# Patient Record
Sex: Male | Born: 1935 | Race: White | Hispanic: No | Smoking: Former smoker
Health system: Southern US, Community
[De-identification: ages and names within clinical notes are randomized; demographics above are authoritative.]

## PROBLEM LIST (undated history)

## (undated) ENCOUNTER — Emergency Department (HOSPITAL_COMMUNITY): Admission: EM | Payer: MEDICARE | Source: Home / Self Care

## (undated) DIAGNOSIS — Z438 Encounter for attention to other artificial openings: Secondary | ICD-10-CM

## (undated) DIAGNOSIS — N189 Chronic kidney disease, unspecified: Secondary | ICD-10-CM

## (undated) DIAGNOSIS — M109 Gout, unspecified: Secondary | ICD-10-CM

## (undated) DIAGNOSIS — I82409 Acute embolism and thrombosis of unspecified deep veins of unspecified lower extremity: Secondary | ICD-10-CM

## (undated) DIAGNOSIS — Z960 Presence of urogenital implants: Secondary | ICD-10-CM

## (undated) DIAGNOSIS — R32 Unspecified urinary incontinence: Secondary | ICD-10-CM

## (undated) DIAGNOSIS — D649 Anemia, unspecified: Secondary | ICD-10-CM

## (undated) DIAGNOSIS — Z972 Presence of dental prosthetic device (complete) (partial): Secondary | ICD-10-CM

## (undated) DIAGNOSIS — I10 Essential (primary) hypertension: Secondary | ICD-10-CM

## (undated) DIAGNOSIS — F32A Depression, unspecified: Secondary | ICD-10-CM

## (undated) DIAGNOSIS — F419 Anxiety disorder, unspecified: Secondary | ICD-10-CM

## (undated) DIAGNOSIS — C801 Malignant (primary) neoplasm, unspecified: Secondary | ICD-10-CM

## (undated) DIAGNOSIS — I5032 Chronic diastolic (congestive) heart failure: Secondary | ICD-10-CM

## (undated) DIAGNOSIS — K219 Gastro-esophageal reflux disease without esophagitis: Secondary | ICD-10-CM

## (undated) DIAGNOSIS — E871 Hypo-osmolality and hyponatremia: Secondary | ICD-10-CM

## (undated) DIAGNOSIS — Z87442 Personal history of urinary calculi: Secondary | ICD-10-CM

## (undated) DIAGNOSIS — N289 Disorder of kidney and ureter, unspecified: Secondary | ICD-10-CM

## (undated) DIAGNOSIS — Z973 Presence of spectacles and contact lenses: Secondary | ICD-10-CM

## (undated) DIAGNOSIS — I6529 Occlusion and stenosis of unspecified carotid artery: Secondary | ICD-10-CM

## (undated) DIAGNOSIS — IMO0001 Reserved for inherently not codable concepts without codable children: Secondary | ICD-10-CM

## (undated) DIAGNOSIS — E785 Hyperlipidemia, unspecified: Secondary | ICD-10-CM

## (undated) HISTORY — DX: Unspecified urinary incontinence: R32

## (undated) HISTORY — DX: Hypo-osmolality and hyponatremia: E87.1

## (undated) HISTORY — DX: Essential (primary) hypertension: I10

## (undated) HISTORY — PX: NEPHROSTOMY: SHX1014

## (undated) HISTORY — PX: CORONARY STENT PLACEMENT: SHX1402

## (undated) HISTORY — DX: Reserved for inherently not codable concepts without codable children: IMO0001

## (undated) HISTORY — PX: OTHER SURGICAL HISTORY: SHX169

## (undated) HISTORY — DX: Gastro-esophageal reflux disease without esophagitis: K21.9

## (undated) HISTORY — DX: Hyperlipidemia, unspecified: E78.5

## (undated) HISTORY — PX: CARDIAC CATHETERIZATION: SHX172

## (undated) HISTORY — DX: Occlusion and stenosis of unspecified carotid artery: I65.29

## (undated) HISTORY — DX: Chronic diastolic (congestive) heart failure: I50.32

## (undated) HISTORY — PX: URINARY SPHINCTER IMPLANT: SHX2624

## (undated) HISTORY — PX: HERNIA REPAIR: SHX51

## (undated) HISTORY — PX: MULTIPLE TOOTH EXTRACTIONS: SHX2053

## (undated) HISTORY — DX: Chronic kidney disease, unspecified: N18.9

---

## 2001-01-01 ENCOUNTER — Encounter: Admission: RE | Admit: 2001-01-01 | Discharge: 2001-04-01 | Payer: Self-pay | Admitting: Radiation Oncology

## 2002-06-23 ENCOUNTER — Ambulatory Visit (HOSPITAL_COMMUNITY): Admission: RE | Admit: 2002-06-23 | Discharge: 2002-06-23 | Payer: Self-pay | Admitting: General Surgery

## 2002-11-23 ENCOUNTER — Ambulatory Visit (HOSPITAL_COMMUNITY): Admission: RE | Admit: 2002-11-23 | Discharge: 2002-11-23 | Payer: Self-pay | Admitting: Pulmonary Disease

## 2002-12-08 ENCOUNTER — Ambulatory Visit (HOSPITAL_COMMUNITY): Admission: RE | Admit: 2002-12-08 | Discharge: 2002-12-08 | Payer: Self-pay | Admitting: Cardiology

## 2002-12-08 ENCOUNTER — Encounter: Payer: Self-pay | Admitting: Cardiology

## 2002-12-16 ENCOUNTER — Ambulatory Visit (HOSPITAL_COMMUNITY): Admission: RE | Admit: 2002-12-16 | Discharge: 2002-12-17 | Payer: Self-pay | Admitting: *Deleted

## 2002-12-27 ENCOUNTER — Encounter: Payer: Self-pay | Admitting: Cardiology

## 2002-12-27 ENCOUNTER — Ambulatory Visit (HOSPITAL_COMMUNITY): Admission: RE | Admit: 2002-12-27 | Discharge: 2002-12-27 | Payer: Self-pay | Admitting: Cardiology

## 2003-02-03 ENCOUNTER — Encounter (HOSPITAL_COMMUNITY): Admission: RE | Admit: 2003-02-03 | Discharge: 2003-03-05 | Payer: Self-pay | Admitting: Cardiology

## 2003-08-29 ENCOUNTER — Ambulatory Visit (HOSPITAL_COMMUNITY): Admission: RE | Admit: 2003-08-29 | Discharge: 2003-08-29 | Payer: Self-pay | Admitting: Pulmonary Disease

## 2004-05-07 ENCOUNTER — Ambulatory Visit (HOSPITAL_COMMUNITY): Admission: RE | Admit: 2004-05-07 | Discharge: 2004-05-07 | Payer: Self-pay | Admitting: Internal Medicine

## 2005-04-22 ENCOUNTER — Ambulatory Visit: Payer: Self-pay | Admitting: Cardiology

## 2005-11-04 ENCOUNTER — Emergency Department (HOSPITAL_COMMUNITY): Admission: EM | Admit: 2005-11-04 | Discharge: 2005-11-05 | Payer: Self-pay | Admitting: Emergency Medicine

## 2006-06-12 ENCOUNTER — Ambulatory Visit: Payer: Self-pay | Admitting: Cardiology

## 2006-06-12 ENCOUNTER — Ambulatory Visit: Payer: Self-pay

## 2006-06-19 ENCOUNTER — Ambulatory Visit: Payer: Self-pay

## 2006-06-19 ENCOUNTER — Ambulatory Visit: Payer: Self-pay | Admitting: Cardiology

## 2006-06-26 ENCOUNTER — Ambulatory Visit: Payer: Self-pay | Admitting: Cardiology

## 2006-08-06 ENCOUNTER — Ambulatory Visit (HOSPITAL_COMMUNITY): Admission: RE | Admit: 2006-08-06 | Discharge: 2006-08-06 | Payer: Self-pay | Admitting: Pulmonary Disease

## 2007-11-01 ENCOUNTER — Emergency Department (HOSPITAL_COMMUNITY): Admission: EM | Admit: 2007-11-01 | Discharge: 2007-11-01 | Payer: Self-pay | Admitting: Emergency Medicine

## 2007-12-08 ENCOUNTER — Ambulatory Visit: Payer: Self-pay | Admitting: Cardiology

## 2007-12-08 LAB — CONVERTED CEMR LAB
ALT: 19 units/L (ref 0–53)
AST: 26 units/L (ref 0–37)
Albumin: 3.8 g/dL (ref 3.5–5.2)
Alkaline Phosphatase: 63 units/L (ref 39–117)
BUN: 20 mg/dL (ref 6–23)
Bilirubin, Direct: 0.1 mg/dL (ref 0.0–0.3)
CO2: 28 meq/L (ref 19–32)
Calcium: 9.6 mg/dL (ref 8.4–10.5)
Chloride: 104 meq/L (ref 96–112)
Cholesterol: 167 mg/dL (ref 0–200)
Creatinine, Ser: 1.7 mg/dL — ABNORMAL HIGH (ref 0.4–1.5)
GFR calc Af Amer: 51 mL/min
GFR calc non Af Amer: 42 mL/min
Glucose, Bld: 120 mg/dL — ABNORMAL HIGH (ref 70–99)
HDL: 36 mg/dL — ABNORMAL LOW (ref >39.0)
LDL Cholesterol: 98 mg/dL (ref 0–99)
Potassium: 4.1 meq/L (ref 3.5–5.1)
Sodium: 138 meq/L (ref 135–145)
Total Bilirubin: 0.9 mg/dL (ref 0.3–1.2)
Total CHOL/HDL Ratio: 4.6
Total Protein: 7.7 g/dL (ref 6.0–8.3)
Triglycerides: 166 mg/dL — ABNORMAL HIGH (ref 0–149)
VLDL: 33 mg/dL (ref 0–40)

## 2007-12-11 ENCOUNTER — Observation Stay (HOSPITAL_COMMUNITY): Admission: EM | Admit: 2007-12-11 | Discharge: 2007-12-12 | Payer: Self-pay | Admitting: Emergency Medicine

## 2007-12-11 ENCOUNTER — Ambulatory Visit: Payer: Self-pay | Admitting: Cardiology

## 2007-12-15 ENCOUNTER — Ambulatory Visit: Payer: Self-pay

## 2007-12-18 ENCOUNTER — Ambulatory Visit: Payer: Self-pay | Admitting: Cardiology

## 2008-06-10 ENCOUNTER — Ambulatory Visit: Payer: Self-pay

## 2008-06-15 ENCOUNTER — Ambulatory Visit: Payer: Self-pay | Admitting: Cardiology

## 2008-07-26 ENCOUNTER — Ambulatory Visit: Payer: Self-pay | Admitting: Cardiology

## 2008-07-26 LAB — CONVERTED CEMR LAB
ALT: 19 units/L (ref 0–53)
AST: 23 units/L (ref 0–37)
Albumin: 3.4 g/dL — ABNORMAL LOW (ref 3.5–5.2)
Alkaline Phosphatase: 63 units/L (ref 39–117)
BUN: 26 mg/dL — ABNORMAL HIGH (ref 6–23)
Bilirubin, Direct: 0.1 mg/dL (ref 0.0–0.3)
CO2: 26 meq/L (ref 19–32)
Calcium: 9 mg/dL (ref 8.4–10.5)
Chloride: 115 meq/L — ABNORMAL HIGH (ref 96–112)
Cholesterol: 146 mg/dL (ref 0–200)
Creatinine, Ser: 1.7 mg/dL — ABNORMAL HIGH (ref 0.4–1.5)
GFR calc Af Amer: 51 mL/min
GFR calc non Af Amer: 42 mL/min
Glucose, Bld: 101 mg/dL — ABNORMAL HIGH (ref 70–99)
HDL: 30.3 mg/dL — ABNORMAL LOW (ref >39.0)
LDL Cholesterol: 82 mg/dL (ref 0–99)
Potassium: 4.3 meq/L (ref 3.5–5.1)
Sodium: 143 meq/L (ref 135–145)
Total Bilirubin: 0.5 mg/dL (ref 0.3–1.2)
Total CHOL/HDL Ratio: 4.8
Total Protein: 6.8 g/dL (ref 6.0–8.3)
Triglycerides: 171 mg/dL — ABNORMAL HIGH (ref 0–149)
VLDL: 34 mg/dL (ref 0–40)

## 2008-12-08 ENCOUNTER — Ambulatory Visit: Payer: Self-pay | Admitting: Cardiology

## 2009-01-05 ENCOUNTER — Ambulatory Visit (HOSPITAL_COMMUNITY): Admission: RE | Admit: 2009-01-05 | Discharge: 2009-01-05 | Payer: Self-pay | Admitting: Pulmonary Disease

## 2009-02-03 ENCOUNTER — Ambulatory Visit (HOSPITAL_COMMUNITY): Admission: RE | Admit: 2009-02-03 | Discharge: 2009-02-03 | Payer: Self-pay | Admitting: Pulmonary Disease

## 2009-02-08 ENCOUNTER — Encounter (HOSPITAL_COMMUNITY): Admission: RE | Admit: 2009-02-08 | Discharge: 2009-03-10 | Payer: Self-pay | Admitting: Pulmonary Disease

## 2009-03-10 ENCOUNTER — Ambulatory Visit (HOSPITAL_COMMUNITY): Admission: RE | Admit: 2009-03-10 | Discharge: 2009-03-10 | Payer: Self-pay | Admitting: Pulmonary Disease

## 2009-12-28 ENCOUNTER — Ambulatory Visit (HOSPITAL_COMMUNITY): Admission: RE | Admit: 2009-12-28 | Discharge: 2009-12-28 | Payer: Self-pay | Admitting: Urology

## 2010-01-08 ENCOUNTER — Ambulatory Visit (HOSPITAL_COMMUNITY): Admission: RE | Admit: 2010-01-08 | Discharge: 2010-01-08 | Payer: Self-pay | Admitting: Pulmonary Disease

## 2010-03-01 ENCOUNTER — Ambulatory Visit (HOSPITAL_COMMUNITY): Admission: RE | Admit: 2010-03-01 | Discharge: 2010-03-01 | Payer: Self-pay | Admitting: Urology

## 2010-03-14 ENCOUNTER — Emergency Department (HOSPITAL_COMMUNITY)
Admission: EM | Admit: 2010-03-14 | Discharge: 2010-03-14 | Payer: Self-pay | Source: Home / Self Care | Admitting: Emergency Medicine

## 2010-04-16 ENCOUNTER — Ambulatory Visit (HOSPITAL_COMMUNITY): Admission: RE | Admit: 2010-04-16 | Discharge: 2010-04-16 | Payer: Self-pay | Admitting: Urology

## 2010-04-20 ENCOUNTER — Encounter: Payer: Self-pay | Admitting: Orthopedic Surgery

## 2010-04-20 ENCOUNTER — Emergency Department (HOSPITAL_COMMUNITY)
Admission: EM | Admit: 2010-04-20 | Discharge: 2010-04-20 | Payer: Self-pay | Source: Home / Self Care | Admitting: Emergency Medicine

## 2010-04-25 ENCOUNTER — Ambulatory Visit: Payer: Self-pay | Admitting: Orthopedic Surgery

## 2010-04-25 DIAGNOSIS — S42033A Displaced fracture of lateral end of unspecified clavicle, initial encounter for closed fracture: Secondary | ICD-10-CM | POA: Insufficient documentation

## 2010-05-16 ENCOUNTER — Ambulatory Visit: Payer: Self-pay | Admitting: Orthopedic Surgery

## 2010-06-08 ENCOUNTER — Ambulatory Visit (HOSPITAL_COMMUNITY): Admission: RE | Admit: 2010-06-08 | Discharge: 2010-06-08 | Payer: Self-pay | Admitting: Orthopedic Surgery

## 2010-06-08 ENCOUNTER — Encounter: Payer: Self-pay | Admitting: Orthopedic Surgery

## 2010-06-11 ENCOUNTER — Ambulatory Visit: Payer: Self-pay | Admitting: Orthopedic Surgery

## 2010-06-14 ENCOUNTER — Encounter: Payer: Self-pay | Admitting: Cardiology

## 2010-06-14 DIAGNOSIS — I6529 Occlusion and stenosis of unspecified carotid artery: Secondary | ICD-10-CM | POA: Insufficient documentation

## 2010-06-14 DIAGNOSIS — I2510 Atherosclerotic heart disease of native coronary artery without angina pectoris: Secondary | ICD-10-CM | POA: Insufficient documentation

## 2010-06-15 ENCOUNTER — Encounter: Payer: Self-pay | Admitting: Cardiology

## 2010-06-15 ENCOUNTER — Ambulatory Visit: Payer: Self-pay

## 2010-08-27 DIAGNOSIS — D649 Anemia, unspecified: Secondary | ICD-10-CM | POA: Insufficient documentation

## 2010-08-27 DIAGNOSIS — R32 Unspecified urinary incontinence: Secondary | ICD-10-CM | POA: Insufficient documentation

## 2010-08-27 DIAGNOSIS — R809 Proteinuria, unspecified: Secondary | ICD-10-CM | POA: Insufficient documentation

## 2010-08-27 DIAGNOSIS — Z8546 Personal history of malignant neoplasm of prostate: Secondary | ICD-10-CM | POA: Insufficient documentation

## 2010-12-23 ENCOUNTER — Encounter: Payer: Self-pay | Admitting: Urology
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
## 2011-01-01 NOTE — Miscellaneous (Signed)
Summary: Orders Update  Clinical Lists Changes  Problems: Added new problem of CAROTID ARTERY DISEASE (ICD-433.10) Orders: Added new Test order of Carotid Duplex (Carotid Duplex) - Signed 
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
## 2011-01-01 NOTE — Letter (Signed)
Summary: History form  History form   Imported By: Ruffin Pyo 04/25/2010 16:33:05  _____________________________________________________________________  External Attachment:    Type:   Image     Comment:   External Document

## 2011-01-01 NOTE — Assessment & Plan Note (Signed)
Summary: 3 WK RE-CK/XRAY COLLARBONE/RR MEDICARE,MUT OM/CAF   Visit Type:  Follow-up Referring Provider:  [facility] er Primary Provider:  Dr. Luan Pulling  CC:  shoulder clavicle fracture.  History of Present Illness: fracture care followup  LEFT distal clavicle fracture classification type II  He reports he is doing well his date of injury was May 20 he is here for followup x-ray  X-ray shows that his fracture is continuing nondisplaced fracture line still visible  Clinical exam shows no tenderness over the fracture site and near normal range of motion and strength  Patient is considered clinically healed and to resume normal activity  The x-ray was done at [facility]    Allergies: 1)  ! Nubain 2)  ! Codeine   Impression & Recommendations:  Problem # 1:  CLOSED FRACTURE OF ACROMIAL END OF CLAVICLE (ICD-810.03) Assessment Improved  Other Orders: Post-Op Check [id number])  Patient Instructions: 1)  ACTIVITY AS TOLERATED  2)  Please schedule a follow-up appointment as needed.

## 2011-01-08 ENCOUNTER — Telehealth: Payer: Self-pay | Admitting: Cardiology

## 2011-01-17 NOTE — Progress Notes (Signed)
Summary: pt needs to stop aspirin for 5 days   Phone Note From Other Clinic   Caller: Patient Caller: [phone] [facility] hospital Summary of Call: pt needs to stop aspirin min of 5 days- Initial call taken by: Lorenda Hatchet,  January 08, 2011 1:43 PM  Follow-up for Phone Call        I spoke with Juliann Pulse in pre-op at [facility] is to have artificial bladder with sphincter surgery on 01/28/11.  Surgeon would like to know if pt can come off baby aspirin prior to surgery.  It is not a problem if he cannot.  Follow-up by: Joelyn Oms RN,  January 08, 2011 2:49 PM     Appended Document: pt needs to stop aspirin for 5 days yes. i agree.  Appended Document: pt needs to stop aspirin for 5 days Juliann Pulse is aware of recommendations Horton Chin RN

## 2011-02-20 LAB — URINE CULTURE: Colony Count: >=100000

## 2011-02-20 LAB — URINALYSIS, ROUTINE W REFLEX MICROSCOPIC
Bilirubin Urine: NEGATIVE
Glucose, UA: NEGATIVE mg/dL
Ketones, ur: NEGATIVE mg/dL
Nitrite: NEGATIVE
Specific Gravity, Urine: 1.015 (ref 1.005–1.030)
Urobilinogen, UA: 0.2 mg/dL (ref 0.0–1.0)
pH: 6.5 (ref 5.0–8.0)

## 2011-02-20 LAB — URINE MICROSCOPIC-ADD ON

## 2011-02-24 LAB — BASIC METABOLIC PANEL
BUN: 28 mg/dL — ABNORMAL HIGH (ref 6–23)
CO2: 27 mEq/L (ref 19–32)
Calcium: 9.2 mg/dL (ref 8.4–10.5)
Chloride: 106 mEq/L (ref 96–112)
Creatinine, Ser: 1.88 mg/dL — ABNORMAL HIGH (ref 0.4–1.5)
GFR calc Af Amer: 43 mL/min — ABNORMAL LOW (ref >60)
GFR calc non Af Amer: 35 mL/min — ABNORMAL LOW (ref >60)
Glucose, Bld: 94 mg/dL (ref 70–99)
Potassium: 4.5 mEq/L (ref 3.5–5.1)
Sodium: 139 mEq/L (ref 135–145)

## 2011-02-24 LAB — HEMOGLOBIN AND HEMATOCRIT, BLOOD
HCT: 32.7 % — ABNORMAL LOW (ref 39.0–52.0)
Hemoglobin: 11.8 g/dL — ABNORMAL LOW (ref 13.0–17.0)

## 2011-04-16 NOTE — Assessment & Plan Note (Signed)
[facility]                            CARDIOLOGY OFFICE NOTE   NAME:[name], [name] STOTLER                      MRN:          [id number]  DATE:12/08/2007                            DOB:          01-Nov-1934    [name] [name] returns today for further management of the following issues.  1. Coronary artery disease.  He is having no angina or ischemic      symptoms.  He is still active playing golf.  He is due a stress      Myoview this summer.  Last stress Myoview was June 19, 2006 EF of      58% with no ischemia.  2. Hyperlipidemia.  He has not been for blood work in some time and      would like me to check blood work.  3. Nonobstructive carotid disease.  He is due carotid Dopplers this      summer as well.  4. Hypertension.   He has been having a lot of palpitations.  This been particularly over  the holiday season.  He said this holiday was harder than the last one,  having lost his wife 2 years ago this April.   He does get out and play golf.  He says he is having no problems when he  is playing golf.   CURRENT MEDS:  1. Lipitor 10 mg a day.  2. Aspirin 81 mg a day.  3. Multivitamin daily.  4. Fish oil b.i.d.  5. flaxseed oil b.i.d.  6. St. John's wort b.i.d.  7. Citrucel.  8. Prevacid 30 mg a day.  9. Alfalfa 650 mg b.i.d.  10.Atenolol chlorthalidone 50/25 daily.  11.Benazepril 40 mg a day.  12.He carries sublingual nitroglycerin.   EXAM:  Blood pressure 122/62, pulse 60 and regular is EKG is normal.  Weight is 194.  He seems somewhat depressed. Respirations 18.  Alert, oriented, otherwise.  HEENT:  Unchanged.  Carotids were equal bilaterally with soft systolic sounds.  Thyroid is  not enlarged.  Trachea is midline.  LUNGS:  Clear.  HEART:  Reveals a regular rate and rhythm.  PMI is nondisplaced.  ABDOMEN:  Soft, good bowel sounds.  No obvious organomegaly.  EXTREMITIES:  No sinus clubbing or edema.  Pulses are present.   ASSESSMENT/PLAN:   [name] [name] is stable from our standpoint.  The  palpitations are probably of no major consequence.  He is already on  beta blockade.  Will check his potassium.   He is due lipids and will arrange for these today.   He will return in June for stress Myoview as well as carotid Dopplers.  I will see him shortly thereafter.     Thomas C. Verl Blalock, MD, [facility]  Electronically Signed    TCW/MedQ  DD: 12/08/2007  DT: 12/08/2007  Job #: [id number]   cc:   Percell Miller L. Luan Pulling, M.D.

## 2011-04-16 NOTE — Consult Note (Signed)
[name] [name], [name] [name]               ACCOUNT NO.:  [id number]   MEDICAL RECORD NO.:  [id number]          PATIENT TYPE:  INP   LOCATION:  A204                          FACILITY:  [facility]   PHYSICIAN:  Cristopher Estimable. Lattie Haw, MD, FACCDATE OF BIRTH:  07/15/1934   DATE OF CONSULTATION:  12/11/2007  DATE OF DISCHARGE:                                 CONSULTATION   REFERRING PHYSICIAN:  Dr. Luan Pulling.   PRIMARY CARDIOLOGIST:  Dr. Verl Blalock.   HISTORY OF PRESENT ILLNESS:  A 77 year old gentleman with coronary  disease and previous percutaneous intervention presents with chest  discomfort.  [name] [name] cardiac history dates to 2004 when he required  a drug-eluting stent to the mid left anterior descending coronary  artery.  He had residual lesions of 30-60% in a diagonal, a marginal and  in the right coronary.  His most recent stress nuclear study was  negative in July 2007 pain. He has occasional chest discomfort.  He  carries nitroglycerin, but does not take it.  He has decent exercise  tolerance.  He presents with 8 hours of persistent left chest aching  discomfort of mild to moderate severity.  There are no associated  symptoms.  There is no radiation.  There is no chest wall tenderness.  There is no relationship to body position, time of day or activity.  He  took nitroglycerin without benefit.  His symptoms have subsided since he  came to the hospital.   Cardiovascular risk factors include hypertension and hyperlipidemia.  He  has a remote history of cigarette smoking with a 30 pack-year  consumption.   PAST MEDICAL HISTORY:  Otherwise notable for prior prostatectomy for  carcinoma, prior hernia repair, GERD, DJD and cataracts with surgery  just 1 day ago.   SOCIAL HISTORY:  Retired from work with the railroad; widower who  resides in [place].  No excessive use of alcohol.   No real allergies.  Prior adverse reaction to Nubain and codeine.   CURRENT MEDICATIONS:  1. Atorvastatin 10 mg  daily.  2. Aspirin 81 mg daily.  3. Multivitamin.  4. Fish oil.  5. Prevacid 30 mg daily.  6. Atenolol/chlorthalidone 50/12.5 mg daily.  7. Benazepril 40 mg daily.   FAMILY HISTORY:  Father and mother both suffered fatal CVAs.   REVIEW OF SYSTEMS:  Notable for recent headache, recent pneumococcal  vaccine and influenza vaccine, intermittent heartburn, a regular diet,  upper and lower dentures, need for corrective lenses.  All other systems  reviewed and are negative.   PHYSICAL EXAMINATION:  GENERAL:  A pleasant, well-appearing gentleman in  no acute distress.  VITAL SIGNS:  The temperature is 98.0, heart rate 65 and regular,  respirations 18, blood pressure 135/66, O2 saturation 97% on 2 liters.  Weight 87.1 kg.  HEENT:  EOMs full; normal oral mucosa.  NECK:  No jugular venous distention; normal carotid upstrokes without  bruits.  ENDOCRINE:  No thyromegaly.  HEMATOPOIETIC:  No adenopathy.  SKIN:  No significant lesions.  PSYCHIATRIC:  Alert and oriented; normal affect.  LUNGS:  Clear.  CARDIAC:  Normal first and second  heart sounds; fourth heart sound and  minimal systolic ejection murmur present; normal PMI.  ABDOMEN:  Normal bowel sounds; no masses; no organomegaly.  EXTREMITIES:  No edema; distal pulses intact.  NEUROMUSCULAR:  Symmetric strength and tone; normal cranial nerves.   EKG:  Normal sinus rhythm; within normal limits.  Other laboratory notable for a normal CBC, class 3-4 chronic kidney  disease with a creatinine of 1.9, potassium of 3.5, negative point of  care markers and a decent lipid profile with total cholesterol of 166,  triglycerides of 167, HDL 36 and LDL of 98.   IMPRESSION:  [name] [name] has somewhat atypical chest discomfort with a  benign exam, negative cardiac markers and a negative EKG.  If symptoms  do not recur, and his EKG and cardiac tests remain negative, he can be  discharged on his usual therapy with plans for a outpatient stress  nuclear  study and follow-up with Dr. Verl Blalock.      Cristopher Estimable. Lattie Haw, MD, [facility]  Electronically Signed     RMR/MEDQ  D:  12/11/2007  T:  12/12/2007  Job:  [id number]

## 2011-04-16 NOTE — Discharge Summary (Signed)
NAME:  [name] [name], [name] [name]               ACCOUNT NO.:  [id number]   MEDICAL RECORD NO.:  [id number]          PATIENT TYPE:  OBV   LOCATION:  A204                          FACILITY:  [facility]   PHYSICIAN:  Edward L. Luan Pulling, M.D.DATE OF BIRTH:  01-25-1934   DATE OF ADMISSION:  12/11/2007  DATE OF DISCHARGE:  01/10/2009LH                               DISCHARGE SUMMARY   FINAL DISCHARGE DIAGNOSES:  1. Atypical chest pain.  2. Coronary artery occlusive disease.  3. Nonobstructive carotid disease.  4. Hyperlipidemia.  5. Hypertension.  6. Recent cataract surgery.  7. Prostate cancer.  8. Mild chronic renal failure.   HISTORY:  [name] [name] is a 77 year old who has a known history of cardiac  disease.  He had cataract surgery on the day prior to admission, went to  eat at a seafood restaurant and developed chest discomfort midway  through his meal.  He had just seen Dr. Verl Blalock, his cardiologist, on the  6th of this month and was really asymptomatic then.  He was set up for a  stress Myoview in June and repeat carotid Dopplers.  He continued to  have chest discomfort which lasted about 8 hours.  He received  nitroglycerin which did not help.  He received Dilaudid which did help  his chest discomfort and then made him very nauseated and dizzy.   PHYSICAL EXAM:  GENERAL:  Showed a well-developed, well-nourished male  who is in no acute distress.  He pointed to his left chest lateral to  the nipple where his pain was and he said that he could cover it with  his finger.  CHEST:  His chest was clear.  HEART:  His heart was regular without gallop or murmurs.  ABDOMEN:  His abdomen was soft.   LAB WORK:  BUN 28, creatinine 1.92.   Cardiac markers were negative in the ER and full cardiac markers were  negative in the hospital.  He had consultation with Dr. Jacqulyn Ducking  of [facility] team who felt that if he ruled out by enzymatic  criteria he could be discharged for an early Myoview stress  test with  Dr. Verl Blalock and that will be arranged.  Otherwise he is going to continue  on his previous medications which include Lipitor 10 mg daily, aspirin  81 mg daily, multiple vitamin one daily, fish oil b.i.d., flaxseed oil  b.i.d., St. John's Wort b.i.d., Citrucel as needed.  Prevacid, he had  been on 30 mg a day and I am going to have him take it twice a day for  the next 2 weeks.  Alfalfa 650 mg b.i.d.,  atenolol/chlorthalidone 50/25 daily, benazepril 240 mg daily and  sublingual nitroglycerin on a p.r.n. basis.  He has been taking  ibuprofen for headaches and I have told him that that may be part of the  problem.  He may also require an ultrasound of his gallbladder if he has  recurrent symptoms.      Edward L. Luan Pulling, M.D.  Electronically Signed     ELH/MEDQ  D:  12/12/2007  T:  12/12/2007  Job:  [id number]   cc:   Thomas C. Longfellow, [place], [address] [place]  [place] [address]

## 2011-04-16 NOTE — Group Therapy Note (Signed)
NAME:  PETAR, REDDER               ACCOUNT NO.:  [id number]   MEDICAL RECORD NO.:  [id number]          PATIENT TYPE:  INP   LOCATION:  A204                          FACILITY:  [facility]   PHYSICIAN:  Edward L. Luan Pulling, M.D.DATE OF BIRTH:  01-Dec-1934   DATE OF PROCEDURE:  DATE OF DISCHARGE:                                 PROGRESS NOTE   Mr. Randlett is doing okay.  He has had no more chest pain.  He feels  alright with no complaints.   PHYSICAL EXAMINATION:  VITAL SIGNS:  Temperature:  98.  Pulse:  60.  Respirations:  18.  Blood pressure:  140/63.  LUNGS:  Chest is clear.  HEART:  Regular.  ABDOMEN:  Soft.   Dr.  Izell [place] input is appreciated and Mr. Garrette is now ruled out for  myocardial infarction by enzymatic criteria.  Per Dr. Izell  note, I  want to plan to discharge him home to try to get him an early follow up  with Dr. Verl Blalock, his regular cardiologist, for a stress test, etc.      Edward L. Luan Pulling, M.D.  Electronically Signed     ELH/MEDQ  D:  12/12/2007  T:  12/12/2007  Job:  [id number]

## 2011-04-16 NOTE — Assessment & Plan Note (Signed)
[facility]                            CARDIOLOGY OFFICE NOTE   NAME:Fugett, DEMANTE BREINING                      MRN:          [id number]  DATE:12/18/2007                            DOB:          1934/08/14    Mr. Goosman comes today after being hospitalized [facility] with  atypical chest pain.  He had an ache under his left breast and this  happened after eating a fairly greasy meal and [place] he admits.   He Ruled out for myocardial infarction.   We set him up for an adenosine rest stress Myoview which shows a trivial  area of apical inferior lateral decreased activity did not show any  significant change with rest or stress.  His EF could not be calculated.   He had no further discomfort.  He has noted fair amount of reflux and  Dr. Luan Pulling just his increase his Prevacid to 30 mg b.i.d.  He is bad to  skip meals, particularly before he goes walking.  The other day he felt  a little lightheaded.   His meds are Lipitor 10 mg a day, multivitamin daily, fish oil b.i.d.,  flaxseed oil b.i.d., St. John's wort b.i.d., Prevacid 30 mg a day,  alfalfa 600mg  p.o. b.i.d., atenolol chlorthalidone 50/25 daily,  benazepril 40 mg a day, aspirin, 81 mg a day.   Blood pressure 138/55, his pulse 58 and regular.  Weight is 192 up 7.  He seems a little bit depressed as he had a few weeks ago in the office.  HEENT:  Unchanged.  Carotids are full.  No bruits.  Thyroid is not  enlarged.  Trachea is midline.  LUNGS:  Clear.  HEART:  Reveals a regular rate and rhythm.  ABDOMEN:  Soft, good bowel sounds.  No midline tenderness or epigastric  tenderness.  There is no hepatomegaly or Murphy sign.  EXTREMITIES:  No cyanosis clubbing, edema.  Pulses intact.  NEURO:  Exam is intact.   Reviewed the stress test.  We also reviewed his blood work which shows  some dietary indiscretion compared to last year.  His total cholesterol  was 167, triglycerides increased 93 to  166, HDL 39 to 36, LDL 86 to 98.  This on 10 Lipitor.   We had a nice talk today.  I have recommended not skip meals and  certainly the before he exercises.  He will stay with a b.i.d.,  Prevacid.  In addition he will try to improve his eating habits.  He is  widowed and probably eats out a lot.   Will plan on seeing him back again in 6 months for closer follow-up.     Thomas C. Verl Blalock, MD, [facility]  Electronically Signed    TCW/MedQ  DD: 12/18/2007  DT: 12/18/2007  Job #: [id number]   cc:   Percell Miller L. Luan Pulling, M.D.

## 2011-04-16 NOTE — Assessment & Plan Note (Signed)
[facility] OFFICE NOTE   NAME:[name] [name], [name] GILLY                      MRN:          [id number]  DATE:12/08/2008                            DOB:          12-18-1933    Ms. [name] [name] comes in today for followup.  He seems depressed and very  lonely without his wife.  She died several years ago.  He used to play  his golf, but it has been extremely cold and he has not gotten out much.  He does not have any close relatives, so the holidays were a little bit  difficult.   He had an episode of chest pain after having cataract surgery in January  2009.  He ruled out for myocardial infarction.  He had a stress Myoview,  which basically showed normal perfusion.   PROBLEM LIST:  1. Coronary artery disease.  He is currently having no angina or      ischemic symptoms.  2. Nonobstructive carotid disease.  Last carotid Dopplers June 10, 2008, showed nonobstructive internal carotid artery stenosis and      antegrade flow in both vertebrals.  He is asymptomatic.  3. Hypertension.  4. Hyperlipidemia.  I increased his Lipitor to 20 mg per day and his      total cholesterol was last checked July 26, 2008, total of 146,      triglycerides of 171, HDL of 30.3, LDL of 82.  5. Chronic renal insufficiency with creatinine of 1.7.   CURRENT MEDICATIONS:  1. Multivitamin daily.  2. Fish oil b.i.d.  3. Flaxseed oil b.i.d.  4. St. John Wort b.i.d.  5. Alfalfa 650 mg p.o. b.i.d.  6. Atenolol.  7. Chlorthalidone 50/25 q.a.m.  8. Benazepril 40 mg per day.  9. Aspirin 81 mg a day.  10.Lipitor 20 mg daily.  11.He carries sublingual nitroglycerin.   PHYSICAL EXAMINATION:  VITAL SIGNS:  His blood pressure is 140/64, his  pulse is 66 and regular, his weight is 201.  HEENT:  Other than a beard, he is normal.  NECK:  Carotid upstrokes were equal bilaterally without obvious bruits.  Thyroid is not enlarged.  Trachea is midline.  LUNGS:  Clear to auscultation and percussion.  HEART:  Nondisplaced PMI.  Normal S1 and S2.  No gallop.  ABDOMEN:  Slightly protuberant, good bowel sounds.  EXTREMITIES:  No cyanosis, clubbing, or edema.  Pulses are present.  NEURO:  Intact.  His affect is depressed.  He is alert and oriented x3  otherwise.  SKIN:  Few ecchymoses.   ASSESSMENT AND PLAN:  [name] [name] [name] is stable from my standpoint.  I am  pleased with his lipid response to higher dose of Lipitor.  I have  encouraged him to get out as much as possible.  Hopefully, the cold  whether resolves soon and he can get to the playing golf.  I will plan  on seeing him back in 6 months.     Thomas C. Verl Blalock, MD, [facility]  Electronically Signed    TCW/MedQ  DD: 12/08/2008  DT: 12/09/2008  Job #: [id number]

## 2011-04-16 NOTE — Assessment & Plan Note (Signed)
[facility]                            CARDIOLOGY OFFICE NOTE   NAME:[name] [name], [name] [name]                      MRN:          [id number]  DATE:06/15/2008                            DOB:          1934-03-02    Mr. Prak comes in today for further management of his coronary artery  disease.  He is currently having no angina or ischemic symptoms.  He is  playing a lot of golf which is his outlet.  He is widowed and still  lives alone and cooks for himself.  His last stress Myoview was  12/15/2007 which was stable.  His EF was not calculated, but he had an  overall low-risk scan with no ischemia.  He also has nonobstructive  carotid disease, last carotid Doppler 06/19/2006.   He has hyperlipidemia and has not had recent blood work.  His lipids  were not quite at goal in January 2009.  He had an LDL of 98 and HDL 36.  He is on 10 of Lipitor.  He denies any orthopnea, PND, syncope,  presyncope, tachy palpitations, or angina.   CURRENT MEDICATIONS:  1. Lipitor 10 mg a day.  2. Multivitamin daily.  3. Fish oil b.i.d.  4. Flaxseed oil b.i.d.  5. St. John's wort b.i.d.  6. Alfalfa.  7. Atenolol chlorthalidone 50/25 daily.  8. Benazepril 40 mg p.o. daily.  9. Aspirin 81 mg a day.  10.Prevacid 30 mg a day.   PHYSICAL EXAMINATION:  VITAL SIGNS:  Blood pressure is 136/70, his pulse  is 65 and regular, and his weight is 192.  GENERAL:  He is extremely tanned.  HEENT:  Unremarkable.  NECK:  Carotid upstrokes were equal bilaterally without bruits.  No JVD.  Thyroid is not enlarged.  Trachea is midline.  LUNGS:  Clear.  HEART:  Regular rate and rhythm.  No gallop.  PMI is poorly appreciated.  ABDOMEN:  Soft.  Good bowel sounds.  No obvious organomegaly.  EXTREMITIES:  No cyanosis, clubbing, or edema.  Pulses are intact.  NEURO:  Intact.   Preliminary carotid Dopplers today showed no change with nonobstructive  plaque and antegrade flow in both vertebrals.   Suggestion was made to  follow up in 2 years.   ASSESSMENT AND PLAN:  Mr. Holthaus is doing well.  Since his lipids were  not at goal in January, I have increased his to Lipitor 20 grams daily.  He will need to follow lipids and a comprehensive metabolic panel in  about 6-8 weeks.  I will see him back in 6 months.     Thomas C. Verl Blalock, MD, [facility]  Electronically Signed    TCW/MedQ  DD: 06/15/2008  DT: 06/16/2008  Job #: [id number]   cc:   Percell Miller L. Luan Pulling, M.D.

## 2011-04-16 NOTE — H&P (Signed)
NAME:  [name] [name], [name] [name]               ACCOUNT NO.:  [id number]   MEDICAL RECORD NO.:  [id number]          PATIENT TYPE:  INP   LOCATION:  ED99                          FACILITY:  [facility]   PHYSICIAN:  Edward L. Luan Pulling, M.D.DATE OF BIRTH:  1934/05/23   DATE OF ADMISSION:  12/11/2007  DATE OF DISCHARGE:  LH                              HISTORY & PHYSICAL   REASON FOR ADMISSION:  Chest pain.   HISTORY:  Mr. Beerman is a 77 year old who has a known history of cardiac  disease having had a previous stent placement and who had cataract  surgery yesterday and then went to eat at [facility]  and developed some chest discomfort midway through his meal.  He had  just seen Dr. Verl Blalock, his cardiologist, on the 6th and was having no  angina and no ischemic symptoms. A stress Myoview in 2007 showed no  ischemia and he was going to be set up for a stress Myoview in June and  carotid Dopplers.  He continued to have chest discomfort through the  evening and came to the emergency room at about midnight. He did receive  nitroglycerin which did not seem to help his chest discomfort and then  he received Dilaudid which helped his chest discomfort that made him  very nauseated and dizzy.  Since then he has been in the emergency room  awaiting a bed.   PAST MEDICAL HISTORY:  Positive for coronary artery occlusive disease.  He has nonobstructive carotid disease.  He has hyperlipidemia, he has  hypertension, he has had the recent cataract, he has a history of  prostate cancer.   SOCIAL HISTORY:  He is a widower.  He does not smoke.  He does not drink  alcohol and does not use any illicit drugs.   MEDICATIONS AT HOME:  1. Lipitor or 10 mg daily.  2. Aspirin 81 mg daily.  3. Multiple vitamin 1 daily.  4. Fish oil b.i.d.  5. Flaxseed oil b.i.d.  6. St. John's Wort b.i.d.  7. Citrucel as needed.  8. Prevacid 30 mg daily.  9. Alfalfa 650 mg b.i.d.  10.Atenolol chlorthalidone 50/25 daily.  11.Benazepril 40 mg daily.  12.Sublingual nitroglycerin.   FAMILY HISTORY:  He has a positive family history of coronary disease in  several family members.   REVIEW OF SYSTEMS:  Except as mentioned is essentially negative.  He had  been to the emergency room with some bladder spasm about 2 months ago.   PHYSICAL EXAM:  He is awake and alert. He says he feels okay.  He points  to the left side of his chest lateral to the nipple where he has the  pain.  Blood pressure is in the 120s, pulse 60s, respirations about 12.  His pupils are reactive to light and accommodation.  Nose and throat are  clear.  NECK:  Supple without masses.  CHEST:  Clear without wheezes, rales or rhonchi.  HEART:  Regular.  I do not hear a gallop.  He does not have a murmur.  ABDOMEN:  Soft.  Bowel sounds present.  EXTREMITIES:  Showed no edema.   LABORATORY DATA:  Thus far cardiac markers point of care x2 are  negative.  BMET  shows BUN 28, creatinine 1.92.  CBC, white count 6700,  hemoglobin 12.8, platelet 229. Hepatic function normal.   ASSESSMENT:  He has chest discomfort.  He does have a history of  coronary disease so he is clearly going to need this to have this to  have MI ruled out.  I am going to get a cardiology consultation, serial  EKGs, cardiac enzymes and follow.      Edward L. Luan Pulling, M.D.  Electronically Signed     ELH/MEDQ  D:  12/11/2007  T:  12/11/2007  Job:  [id number]

## 2011-04-19 NOTE — Procedures (Signed)
   NAME:  [name] [name], [name] [name]                         ACCOUNT NO.:  [id number]   MEDICAL RECORD NO.:  [id number]                   PATIENT TYPE:  OUT   LOCATION:  DFTL                                 FACILITY:  [facility]   PHYSICIAN:  Edward L. Luan Pulling, M.D.             DATE OF BIRTH:  14-Feb-1934   DATE OF PROCEDURE:  11/23/2002  DATE OF DISCHARGE:                                    STRESS TEST   INDICATIONS FOR PROCEDURE:  This patient has been having an odd sensation in  which his arms feel week and this is generally associated with exertion.  He  also has hypertension and hyperlipidemia so he has multiple risk factors.  There are no contraindications to graded exercise testing.   DESCRIPTION OF PROCEDURE:  This patient exercised for 6 minutes and 18  seconds reaching and sustaining 7.0 METS.  His maximum recorded heart rate  was 130 which is 85% of his age-predicted maximal heart rate.  He had normal  blood pressure response to exercise.  He had leg fatigue, but no chest or  arm discomfort of any kind.  His electrocardiogram showed marked ST-T wave  changes inferiorly and laterally, suggestive of inducible ischemia.   IMPRESSION:  1. Fair exercise tolerance.  2. Normal blood pressure response to exercise.  3. Leg fatigue but no chest discomfort with exercise.  4. Marked ST-T wave changes suggestive of inducible ischemia with exercise.                                               Edward L. Luan Pulling, M.D.    ELH/MEDQ  D:  11/23/2002  T:  11/23/2002  Job:  [id number]

## 2011-04-19 NOTE — Op Note (Signed)
[facility]  Patient:    [name] [name], [name] [name] Visit Number: [id number] MRN: [id number]          Service Type: DSU Location: DAY Attending Physician:  Jamesetta So Dictated by:   Aviva Signs, M.D. Proc. Date: 06/23/02 Admit Date:  06/23/2002 Discharge Date: 06/23/2002   CC:         [name], M.D.   Operative Report  PREOPERATIVE DIAGNOSIS:  Right inguinal hernia.  POSTOPERATIVE DIAGNOSIS:  Right inguinal hernia, indirect.  PROCEDURE:  Right inguinal herniorrhaphy.  SURGEON:  Aviva Signs, M.D.  ANESTHESIA:  Spinal.  INDICATIONS:  The patient is a 77 year old white male who presents with a right inguinal hernia, which is symptomatic in nature.  The risks and benefits of the procedure including bleeding, infection, recurrence of the hernia, and nerve injury were fully explained to the patient and gained informed consent.  PROCEDURE NOTE:  The patient was placed in the supine position after spinal anesthesia was administered. The right groin region was prepped and draped using the usual sterile technique with Betadine.  An oblique incision was made in the right groin region down to the external oblique aponeurosis.  The aponeurosis was incised to the external ring.  A Penrose drain was placed around the spermatic cord.  The ilioinguinal nerve was identified and retracted inferiorly from the operative field.  A large lipoma of the cord was excised.  An indirect hernia sac was found and was inverted.  A large size Marlex mesh plug was placed along the floor of the internal ring and secured to the transversalis fascia using 2-0 Novofil interrupted suture. An onlay Marlex mesh patch was then placed along the floor of the inguinal canal and secured superiorly to the conjoined tendon and inferiorly to the shelving edge of the Pouparts ligament using a 2-0 Novofil interrupted suture.  The internal ring was recreated using a 2-0  Novofil interrupted suture.  The external oblique aponeurosis was reapproximated using 2-0 Vicryl running suture.  The subcutaneous layer was reapproximated using a 3-0 Vicryl interrupted suture.  The skin was closed using a 4-0 Vicryl subcuticular suture.  0.5% Sensorcaine was instilled into the surrounding wound, and the wound was covered with collodion.  All tape and needle counts were correct at the end of the procedure.  The patient was transferred to PACU in stable condition.  COMPLICATIONS: None.  SPECIMENS:  None.  BLOOD LOSS:  Minimal. Dictated by:   Aviva Signs, M.D. Attending Physician:  Jamesetta So DD:  06/23/02 TD:  06/27/02 Job: [id number]

## 2011-04-19 NOTE — Assessment & Plan Note (Signed)
[facility]                              CARDIOLOGY OFFICE NOTE   NAME:[name] [name], [name] [name]                      MRN:          [id number]  DATE:06/12/2006                            DOB:          [birth year]    HISTORY OF PRESENT ILLNESS:  Mr.  [name] [name] comes in today for follow up of the  following issues:   1.  Coronary artery disease.  He has had no ischemic symptoms.  He is due a      stress Myoview.  2.  Hyperlipidemia.  He is due lipids.  3.  Right carotid bruit.  He has nonobstructive plaque of the right internal      carotid artery and minimal plaque of the left internal carotid artery.      He has antegrade flow in both vertebrals.  He is due a carotid Doppler.  4.  Hypertension.  His blood pressure is elevated today.  Checked on couple      of occasions.  He takes his blood pressure medicines at night.   Unfortunately, he lost his wife to some unknown, undiagnosed illness in  April.  She did a lot of foreign travel.  He has a son who lives in the  [place] area and one in [place].  He has a good support group through his  church and friends, however.  He is down and out today though .   MEDICATIONS:  1.  Lipitor 10 mg daily.  2.  Aspirin 325 mg daily.  3.  Multivitamin.  4.  Fish oil.  5.  Flax seed.  6.  St. John's Wort.  7.  Prevacid 30 mg daily.  8.  Alfalfa.  9.  Benazepril 20 mg daily.  10. Atenolol HCTZ 50/25 daily.   PHYSICAL EXAMINATION:  GENERAL APPEARANCE:  He is depressed.  Very pleasant  as always.  NEUROLOGICAL:  Intact.  VITAL SIGNS:  Blood pressure 154 and 160, checked on two occasions.  His  diastolic is 123456.  Heart rate is 57.  EKG shows sinus brady, otherwise,  normal.  Weight is down 18 pounds to 185.  HEENT:  Unremarkable.  Carotids are full with soft systolic sounds  bilaterally.  Thyroid is not enlarged.  Trachea is midline.  LUNGS:  Clear.  HEART:  Regular rate and rhythm.  ABDOMEN:  Soft with good bowel  sounds.  No midline bruits.  No hepatomegaly.  EXTREMITIES:  No edema.  Pulses are intact.   STUDIES:  Abdominal ultrasound was ordered by accident.  It is normal, but  we will reverse charges on this.  He was due a carotid instead.   ASSESSMENT/PLAN:  [name] [name] is doing well.  His blood pressure is elevated,  and this could be due to his recent loss of his wife and other stressors.   PLAN:  1.  Adenosine Myoview.  2.  Carotid Doppler's.  3.  Increased Benazepril to 40 mg q.h.s.  4.  The day of his stress test, we will check a comprehensive metabolic      panel and lipid panel.  5.  Decrease aspirin to 81 mg daily because of bruising.  6.  Assuming these are stable, we will see him back in a year.                               [name] C. Verl Blalock, MD, [facility]    TCW/MedQ  DD:  06/12/2006  DT:  06/12/2006  Job #:  [id number]   cc:   [name] [name] L. Luan Pulling, MD

## 2011-04-19 NOTE — Discharge Summary (Signed)
NAME:  [name] [name], [name] [name]                         ACCOUNT NO.:  [id number]   MEDICAL RECORD NO.:  [id number]                   [name] TYPE:  OIB   LOCATION:  6529                                 FACILITY:  [facility]   PHYSICIAN:  Christy Sartorius, M.D. [facility]               DATE OF BIRTH:  Aug 14, 1934   DATE OF ADMISSION:  12/16/2002  DATE OF DISCHARGE:                           DISCHARGE SUMMARY - REFERRING   DISCHARGE DIAGNOSES:  1. Coronary artery disease status post Cypher stent placement to [name] LAD     12/16/02.  2. Hypertension, treated.  3. Hyperlipidemia, treated.  4. Prostate cancer status post prostatectomy and radiation therapy.  5. Status post hernia repair.   HOSPITAL COURSE:  [name] [name] is a 77 year old male [name] who has recently  complained of bilateral arm numbness and weakness. He underwent an exercise  stress test under [name] advisement of Dr. [name], and this revealed  inducible ischemia with exercise. He was then seen in consultation for this  problem on 12/03/02 at [name]. At that  point, he was placed on Toprol and given a prescription for sublingual  nitroglycerin. At his request, he followed back up with Dr. Mar Daring prior  to any procedure. He saw Dr. Verl Blalock 12/08/02 who recommended cardiac  catheterization. [name] [name] was then admitted on 12/16/02 for cardiac  catheterization. Catheterization was performed by Dr. Christy Sartorius and  revealed:  Left main normal, LAD 70% mid with calcification, 40% proximal D1  lesion, left circumflex noted to have two obtuse marginals with 30% mild  stenosis. [name] right coronary artery had a 60% mid stenosis with a 30% distal  lesion. Dr. Lyndel Safe then performed a PTCA/Cypher stent placement to [name] 70%  LAD lesion reducing this to 0% stenosis post procedure. LV gram revealed no  MR, good wall motion with an EF greater than 55%. His recommendations were  to place [name] [name] on Plavix for six months, and if  any symptoms persist,  we would then entertain performing a percutaneous intervention upon [name] RCA  lesion. He was maintained in [name] hospital overnight, and by 12/17/02, he was  ready for discharge to home. Vital signs at that time revealed blood  pressure 130/60, pulse 60, respirations 20, O2 saturations 95% on room air,  temperature 97.1. His left groin catheterization site was clean, and with  [name] exception of some slight ecchymosis distal to [name] puncture site, there  was no bruit, and he did have palpable lower extremity pulses.   DISCHARGE MEDICATIONS:  1. Enteric-coated aspirin 325 mg a day.  2. Plavix 75 mg a day for six months.  3. Lipitor 10 mg a day.  4. Corzide one p.o. daily.  5. Sublingual nitroglycerin p.r.n. chest pain.  6. Multivitamin daily.  7. Alfalfa 2,000 mg a day.  8. St. John's Wort daily.  9. Echinacea 350 mg p.r.n.   ALLERGIES:  Nubain.  DISCHARGE INSTRUCTIONS:  He may utilize Tylenol one to two tablets every six  hours as needed for pain. No strenuous activity for two days and gradually  will return to normal activity including driving. Low-fat diet. Clean [name]  catheterization site with soap and water. Call for questions or concerns. He  has followup appointment at Dr. Winnifred Friar office on 12/29/02 at 2 p.m.     Joesphine Bare, P.A. [facility]                      Christy Sartorius, M.D. [facility]    LB/MEDQ  D:  12/17/2002  T:  12/17/2002  Job:  [id number]   cc:   Thomas C. Wall, M.D. [facility]  [address] [address]  Fax: Donegal Luan Pulling, M.D.  [place]  [place] [address]  Fax: [phone]

## 2011-04-19 NOTE — Op Note (Signed)
NAME:  [name], [name]                         ACCOUNT NO.:  [id number]   MEDICAL RECORD NO.:  [id number]                   PATIENT TYPE:  AMB   LOCATION:  DAY                                  FACILITY:  [facility]   PHYSICIAN:  Hildred Laser, M.D.                 DATE OF BIRTH:  1934-11-25   DATE OF PROCEDURE:  DATE OF DISCHARGE:  05/07/2004                                 OPERATIVE REPORT   PROCEDURE:  Total colonoscopy.   ENDOSCOPIST:  Hildred Laser, M.D.   INDICATIONS:  [name] [name] is a 77 year old Caucasian male with recent changes in  his bowel habits.  For the last 2 months he ha has been having 3-4  semiformed somewhat loose stools.  Prior to that he was having 1 stool a  day.  He has also noticed some cramps across his upper abdomen, but has not  had any frank bleeding.  Occasionally he has noted blood on the tissue.  He  has not taken any antibiotics recently.  He is undergoing colonoscopy both  for diagnostic and screening purposes.  The procedure and risks were  reviewed with the patient and informed consent was obtained.   PREOPERATIVE MEDICATIONS:  Demerol 50 mg IV and Versed 3 mg IV.   FINDINGS:  Procedure performed in endoscopy suite.  The patient's vital  signs and O2 saturation were monitored during the procedure and remained  stable.  The patient was placed in the left lateral recumbent position and  rectal examination performed.  No abnormality noted on external or digital  exam.   Olympus videoscope was placed in the rectum and advanced under vision into  the sigmoid colon and beyond.  Preparation was felt to be excellent.  Scope  was passed to the cecum which was identified by appendiceal orifice and  ileocecal valve.  Pictures taken for the record.  A short segment of TL was  also examined and was normal.  As the scope was withdrawn the colonic mucosa  was carefully examined.  There was a single small diverticulum at the  ascending colon.  The mucosa of the rest of  the colon was normal.  Rectal  mucosa similarly was normal.   The scope was retroflexed to examine the anorectal junction and moderate-  sized hemorrhoids were noted below the dentate line.  The endoscope was  straightened and withdrawn.  The patient tolerated the procedure well.   FINAL DIAGNOSES:  1. A single small diverticulum at the ascending colon.  2. Small external hemorrhoids.  3. No evidence of endoscopic colitis.  4. Suspect that we may be dealing with IBS.   RECOMMENDATIONS:  1. High fiber diet.  2. Citrucel 1 tablespoonful daily.  3. Levbid 1/2 tablet with breakfast daily.  4. Patient will call with __________ in 6-8 weeks.  If he is not feeling     better would consider further evaluation.  ___________________________________________                                            Hildred Laser, M.D.   NR/MEDQ  D:  05/07/2004  T:  05/07/2004  Job:  [id number]   cc:   Percell Miller L. Luan Pulling, M.D.  [place]  [place] [address]  Fax: [phone]

## 2011-04-19 NOTE — Cardiovascular Report (Signed)
NAME:  [name] [name], [name] [name]                         ACCOUNT NO.:  [id number]   MEDICAL RECORD NO.:  [id number]                   PATIENT TYPE:  OIB   LOCATION:  6529                                 FACILITY:  [facility]   PHYSICIAN:  Christy Sartorius, M.D. [facility]               DATE OF BIRTH:  1934/02/06   DATE OF PROCEDURE:  12/16/2002  DATE OF DISCHARGE:                              CARDIAC CATHETERIZATION   PROCEDURE PERFORMED:  1. Left heart catheterization.  2. Left ventriculogram.  3. Selective coronary angiography.  4. Abdominal aortogram.  5. Percutaneous transluminal coronary angioplasty and stent placement, mid     left anterior descending artery.   DIAGNOSES:  1. Two-vessel coronary artery disease.  2. Normal left ventricular systolic function.   HISTORY:  The patient is a 77 year old white male who presents with  exertional chest discomfort and shortness of breath.  The patient has also  had some numbness and tingling in both his arms.  He underwent a treadmill  stress test in which he had ST depression consistent with ischemia.  He is  referred for further assessment.   TECHNIQUE:  Informed consent was obtained.  The patient was brought to the  catheterization lab.  A #6 French sheath was placed in the left femoral  artery using the modified Seldinger technique.  The #6 [place] and JR4  catheters were then used to engage the left and right coronary arteries and  selective angiography performed in various projections using manual  injections of contrast.  A #6 French pigtail catheter was then advanced to  the left ventricle and left ventriculogram performed using power injections  of contrast.  The pigtail catheter was brought back in the descending aorta  and abdominal aortogram performed using power injections of contrast.  Initial findings are as follows:   FINDINGS:  1. Left main trunk:  Medium caliber vessel.  Angiographically normal.  2. LAD:  This is a medium caliber  vessel that provides two diagonal branches     and extends to the apex.  The LAD has calcification and eccentric plaque     of 70% in the proximal mid section encompassing the first diagonal     branch.  The distal LAD is a small caliber vessel which exhibits mild     tortuosity and has mild irregularities.  The first diagonal branch has an     ostial narrowing of 40%.  The second diagonal branch has mild disease.  3. Left circumflex artery:  This is a medium caliber vessel that provides a     bifurcating first marginal branch in the proximal segment and a trivial     second marginal branch distally.  The first marginal branch has mild     narrowings of 30%.  The remainder of the left circumflex system has     luminal irregularities.  4. Right coronary artery:  Dominant.  This is  a large caliber vessel that     provides a posterior descending artery and two posterior ventricular     branches in the terminal segment.  The right coronary artery has an     eccentric plaque of 60% in the mid section.  There has been further mild     disease of 30% in the distal vessel.  The branch vessels have mild     irregularities.  5. LV:  Normal end systolic and end diastolic dimensions.  Overall left     ventricular function is well preserved.  Ejection fraction of greater     than 55%.  No mitral regurgitation.  6. LV pressure is 122/6.  Aortic was 122/60.  LVEDP equals 14.  7. Abdominal aorta:  It is of normal caliber.  There is mild atherosclerotic     disease, not greater than 20%.  The renal arteries are single and widely     patent bilaterally.  The iliac arteries are patent bilaterally as well.   These findings were reviewed with the patient and given his symptoms, we  elected to proceed with percutaneous intervention to the LAD and to treat  the RCA medically at this point.  The patient was given 300 mg of Plavix  orally, heparin and Integrilin on a weight-adjusted basis to maintain an ACT  of  greater than 250 seconds.  A #6 [place] CLS 4.0 guide catheter was then  used to engage the left coronary artery, and a 0.014-inch Forte wire  advanced into the mid LAD.  Angiography was then performed to size the  vessel and lesion length.  The Forte wire was advanced into the distal LAD.  A 3.0 x 23-mm CYPHER stent was then introduced, carefully positioned in the  proximal mid LAD to straddle the diagonal branch, and deployed at 14  atmospheres for 45 seconds.  There was mild residual waste in the area of  calcification, and a 3.0 x 15-mm Quantum Maverick balloon was introduced.  This was used to post dilate the distal section of the stent at 14  atmospheres for 30 seconds, and the proximal segment at 16 atmospheres for  30 seconds.  Repeat angiography was then performed after the administration  of intracoronary nitroglycerin showing an excellent result with no residual  stenosis, full coverage of the lesion, and no compromise of the diagonal  branch or septal perforator.  Final angiography was performed in various  projections confirming no distal vessel damage, and the guide catheter then  removed.  The sheath was secured in position.  The patient tolerated the  procedure well and was transferred to the floor in stable condition.   FINAL RESULTS:  Successful percutaneous transluminal coronary angioplasty  and stent placement to the proximal mid left anterior descending artery with  reduction of 70% narrowing to 0% with placement of a 3.0 x 23-mm CYPHER  stent.   ASSESSMENT/PLAN:  The patient is a 77 year old gentleman with two-vessel  coronary artery disease.  He has undergone placement of a CYPHER stent to  the mid left anterior descending artery and will be continued on Plavix for  a minimum of six months time.  His symptoms will be reassessed.  If they  persist, consideration may be given towards intervention of the right coronary artery; however, this appears to be a borderline  lesion at this  point, and thus, medical therapy will be tried first.  Christy Sartorius, M.D. [facility]    NG/MEDQ  D:  12/16/2002  T:  12/16/2002  Job:  [id number]   cc:   Percell Miller L. Luan Pulling, M.D.  [place]  [place] [address]  Fax: [place]. Wall, M.D. [facility]  [address] [place] [address]  Fax: 1

## 2011-06-24 ENCOUNTER — Encounter: Payer: Self-pay | Admitting: Cardiology

## 2011-07-23 ENCOUNTER — Encounter: Payer: Self-pay | Admitting: Cardiology

## 2011-07-24 ENCOUNTER — Other Ambulatory Visit: Payer: Self-pay | Admitting: Cardiology

## 2011-07-24 DIAGNOSIS — I6529 Occlusion and stenosis of unspecified carotid artery: Secondary | ICD-10-CM

## 2011-07-25 ENCOUNTER — Encounter: Payer: Self-pay | Admitting: Cardiology

## 2011-07-25 ENCOUNTER — Ambulatory Visit (INDEPENDENT_AMBULATORY_CARE_PROVIDER_SITE_OTHER): Payer: Medicare Other | Admitting: Cardiology

## 2011-07-25 ENCOUNTER — Encounter (INDEPENDENT_AMBULATORY_CARE_PROVIDER_SITE_OTHER): Payer: Medicare Other | Admitting: Cardiology

## 2011-07-25 VITALS — BP 177/77 | HR 61 | Ht 69.0 in | Wt 205.1 lb

## 2011-07-25 DIAGNOSIS — I10 Essential (primary) hypertension: Secondary | ICD-10-CM | POA: Insufficient documentation

## 2011-07-25 DIAGNOSIS — I6529 Occlusion and stenosis of unspecified carotid artery: Secondary | ICD-10-CM

## 2011-07-25 MED ORDER — AMLODIPINE BESYLATE 5 MG PO TABS: 5.0000 mg | ORAL_TABLET | Freq: Every day | ORAL | Status: DC

## 2011-07-25 NOTE — Patient Instructions (Signed)
Your physician recommends that you  lab work  Today  BMP.  We will call you with your lab results  Your physician has recommended you make the following change in your medication:  start Amlodipine Your physician recommends that you schedule a follow-up appointment in: 2 years with Dr. Verl Blalock  Your physician has requested that you have a carotid duplex. This test is an ultrasound of the carotid arteries in your neck. It looks at blood flow through these arteries that supply the brain with blood. Allow one hour for this exam. There are no restrictions or special instructions. In 2 years. 2014

## 2011-07-25 NOTE — Progress Notes (Signed)
HPI [name] [name] returns today for evaluation and management of his carotid artery disease. I have not seen him a couple of years. He has a history of hypertension, hyperlipidemia, and is being followed by his primary care physician Dr. Luan Pulling.  He's had a radical prostatectomy and has chronic renal insufficiency. His last creatinine was 1.8 that I see from last year.  He does not check his blood pressure regular basis. He is 190/70 in both arms. He takes his medicines at night. Past Medical History  Diagnosis Date  . Hypertension   . Incontinence   . Reflux   . Hyperlipidemia   . Carotid artery occlusion     Past Surgical History  Procedure Date  . Radical prostectomy   . Hernia repair   . Cataracts     Family History  Problem Relation Age of Onset  . Coronary artery disease      family history, male < 22  . Arthritis      family history    History   Social History  . Marital Status: Widowed    Spouse Name: N/A    Number of Children: N/A  . Years of Education: N/A   Occupational History  . retired    Social History Main Topics  . Smoking status: Former Smoker    Quit date: 12/02/1976  . Smokeless tobacco: Not on file  . Alcohol Use: No  . Drug Use: Not on file  . Sexually Active: Not on file   Other Topics Concern  . Not on file   Social History Narrative  . No narrative on file    Allergies  Allergen Reactions  . Codeine   . Nalbuphine     Current Outpatient Prescriptions  Medication Sig Dispense Refill  . aspirin 81 MG tablet Take 81 mg by mouth daily.        Marland Kitchen atenolol-chlorthalidone (TENORETIC) 50-25 MG per tablet Take 1 tablet by mouth daily.        . benazepril (LOTENSIN) 40 MG tablet Take 40 mg by mouth daily.        . fish oil-omega-3 fatty acids 1000 MG capsule Take 2 g by mouth daily.        . Flaxseed, Linseed, (FLAX SEED OIL PO) Take by mouth daily.        . lansoprazole (PREVACID) 30 MG capsule Take 30 mg by mouth daily.        . NON  FORMULARY Take 500 mg by mouth daily. Citrucel       . rosuvastatin (CRESTOR) 10 MG tablet Take 10 mg by mouth daily.          ROS Negative other than HPI.   PE General Appearance: well developed, well nourished in no acute distress HEENT: symmetrical face, PERRLA, good dentition  Neck: no JVD, thyromegaly, or adenopathy, trachea midline Chest: symmetric without deformity Cardiac: PMI non-displaced, RRR, normal S1, S2, no gallop or murmur Lung: clear to ausculation and percussion Vascular: all pulses full without bruits  Abdominal: nondistended, nontender, good bowel sounds, no HSM, no bruits Extremities: no cyanosis, clubbing or edema, no sign of DVT, no varicosities  Skin: normal color, no rashes Neuro: alert and oriented x 3, non-focal Pysch: normal affect Filed Vitals:   07/25/11 1531  BP: 177/77  Pulse: 61  Height: 5\' 9"  (1.753 m)  Weight: 205 lb 1.9 oz (93.042 kg)    EKG  Labs and Studies Reviewed.   Lab Results  Component Value Date  HGB 11.8* 02/27/2010   HCT 32.7* 02/27/2010      Chemistry      Component Value Date/Time   NA 139 02/27/2010 1308   K 4.5 02/27/2010 1308   CL 106 02/27/2010 1308   CO2 27 02/27/2010 1308   BUN 28* 02/27/2010 1308   CREATININE 1.88* 02/27/2010 1308      Component Value Date/Time   CALCIUM 9.2 02/27/2010 1308   ALKPHOS 63 07/26/2008 0917   AST 23 07/26/2008 0917   ALT 19 07/26/2008 0917   BILITOT 0.5 07/26/2008 0917       Lab Results  Component Value Date   CHOL 146 07/26/2008   CHOL 167 12/08/2007   Lab Results  Component Value Date   HDL 30.3* 07/26/2008   HDL 36.0* 12/08/2007   Lab Results  Component Value Date   LDLCALC 82 07/26/2008   LDLCALC 98 12/08/2007   Lab Results  Component Value Date   TRIG 171* 07/26/2008   TRIG 166* 12/08/2007   Lab Results  Component Value Date   CHOLHDL 4.8 CALC 07/26/2008   CHOLHDL 4.6 CALC 12/08/2007   No results found for this basename: HGBA1C   Lab Results  Component Value Date   ALT  19 07/26/2008   AST 23 07/26/2008   ALKPHOS 63 07/26/2008   BILITOT 0.5 07/26/2008   No results found for this basename: TSH

## 2011-07-26 LAB — BASIC METABOLIC PANEL
BUN: 27 mg/dL — ABNORMAL HIGH (ref 6–23)
CO2: 25 mEq/L (ref 19–32)
Calcium: 9.1 mg/dL (ref 8.4–10.5)
Chloride: 106 mEq/L (ref 96–112)
Creatinine, Ser: 2.3 mg/dL — ABNORMAL HIGH (ref 0.4–1.5)
GFR: 29.19 mL/min — ABNORMAL LOW (ref >60.00)
Glucose, Bld: 84 mg/dL (ref 70–99)
Potassium: 4.4 mEq/L (ref 3.5–5.1)
Sodium: 139 mEq/L (ref 135–145)

## 2011-08-08 ENCOUNTER — Telehealth: Payer: Self-pay | Admitting: Cardiology

## 2011-08-08 NOTE — Telephone Encounter (Signed)
Pt c/o high BP. Pt said he was already on one BP medication and last time pt saw Dr. Verl Blalock he put pt on amlodipine. Pt said he has now been taking this new medication for two weeks and pt BP is still high. Please return pt call to discuss further.

## 2011-08-09 NOTE — Telephone Encounter (Signed)
LMTCB Debbie Chirag Krueger RN  

## 2011-08-09 NOTE — Telephone Encounter (Signed)
I spoke with pt who is concerned that his blood pressure is averaging 146/60-70.  Highest being 170/68.  He takes all of his medications at night.  I have suggested he take his amlodipine in the morning.  He will start that today.  He also has c/o headache & occ. Dizziness which he states he has had " for several years" .  Pt will follow-up with pcp Dr. Luan Pulling.  He has taken tylenol this am for his headache and it did "help some".  Pt reassured. Horton Chin RN

## 2011-08-19 ENCOUNTER — Other Ambulatory Visit (HOSPITAL_COMMUNITY): Payer: Self-pay | Admitting: Pulmonary Disease

## 2011-08-19 DIAGNOSIS — R51 Headache: Secondary | ICD-10-CM

## 2011-08-20 ENCOUNTER — Ambulatory Visit (HOSPITAL_COMMUNITY)
Admission: RE | Admit: 2011-08-20 | Discharge: 2011-08-20 | Disposition: A | Discharge status: Home / Self Care | Payer: MEDICARE | Source: Ambulatory Visit | Attending: Pulmonary Disease | Admitting: Pulmonary Disease

## 2011-08-20 ENCOUNTER — Encounter (HOSPITAL_COMMUNITY): Payer: Self-pay

## 2011-08-20 DIAGNOSIS — R51 Headache: Secondary | ICD-10-CM

## 2011-08-20 HISTORY — DX: Malignant (primary) neoplasm, unspecified: C80.1

## 2011-08-21 LAB — DIFFERENTIAL
Basophils Absolute: 0
Basophils Relative: 0
Eosinophils Absolute: 0.1
Eosinophils Relative: 2
Lymphocytes Relative: 19
Lymphs Abs: 1.2
Monocytes Absolute: 0.6
Monocytes Relative: 9
Neutro Abs: 4.7
Neutrophils Relative %: 70

## 2011-08-21 LAB — CK TOTAL AND CKMB (NOT AT ARMC)
CK, MB: 1.1
Relative Index: INVALID
Total CK: 39

## 2011-08-21 LAB — BASIC METABOLIC PANEL
BUN: 28 — ABNORMAL HIGH
CO2: 25
Calcium: 9
Chloride: 101
Creatinine, Ser: 1.92 — ABNORMAL HIGH
GFR calc Af Amer: 42 — ABNORMAL LOW
GFR calc non Af Amer: 35 — ABNORMAL LOW
Glucose, Bld: 137 — ABNORMAL HIGH
Potassium: 3.5
Sodium: 135

## 2011-08-21 LAB — POCT CARDIAC MARKERS
CKMB, poc: 1.2
CKMB, poc: <1 — ABNORMAL LOW
Myoglobin, poc: 108
Myoglobin, poc: 76.3
Operator id: 157891
Operator id: 189501
Troponin i, poc: <0.05
Troponin i, poc: <0.05

## 2011-08-21 LAB — CBC
HCT: 37.8 — ABNORMAL LOW
Hemoglobin: 12.8 — ABNORMAL LOW
MCHC: 34
MCV: 90.9
Platelets: 229
RBC: 4.15 — ABNORMAL LOW
RDW: 13.9
WBC: 6.7

## 2011-08-21 LAB — CARDIAC PANEL(CRET KIN+CKTOT+MB+TROPI)
CK, MB: 1.1
Relative Index: INVALID
Total CK: 50
Troponin I: 0.04

## 2011-08-21 LAB — TROPONIN I: Troponin I: 0.03

## 2011-09-10 LAB — URINALYSIS, ROUTINE W REFLEX MICROSCOPIC
Bilirubin Urine: NEGATIVE
Glucose, UA: NEGATIVE
Hgb urine dipstick: NEGATIVE
Ketones, ur: NEGATIVE
Nitrite: NEGATIVE
Protein, ur: NEGATIVE
Specific Gravity, Urine: >1.03 — ABNORMAL HIGH
Urobilinogen, UA: 0.2
pH: 5

## 2011-09-17 ENCOUNTER — Other Ambulatory Visit (HOSPITAL_COMMUNITY): Payer: Self-pay | Admitting: Pulmonary Disease

## 2011-09-17 DIAGNOSIS — I10 Essential (primary) hypertension: Secondary | ICD-10-CM

## 2011-09-19 ENCOUNTER — Ambulatory Visit (HOSPITAL_COMMUNITY)
Admission: RE | Admit: 2011-09-19 | Discharge: 2011-09-19 | Disposition: A | Discharge status: Home / Self Care | Payer: MEDICARE | Source: Ambulatory Visit | Attending: Pulmonary Disease | Admitting: Pulmonary Disease

## 2011-09-19 DIAGNOSIS — C61 Malignant neoplasm of prostate: Secondary | ICD-10-CM | POA: Insufficient documentation

## 2011-09-19 DIAGNOSIS — R338 Other retention of urine: Secondary | ICD-10-CM | POA: Insufficient documentation

## 2011-09-19 DIAGNOSIS — I10 Essential (primary) hypertension: Secondary | ICD-10-CM | POA: Insufficient documentation

## 2011-11-29 ENCOUNTER — Encounter (HOSPITAL_COMMUNITY): Payer: Self-pay | Admitting: *Deleted

## 2011-11-29 ENCOUNTER — Emergency Department (HOSPITAL_COMMUNITY)
Admission: EM | Admit: 2011-11-29 | Discharge: 2011-11-30 | Disposition: A | Discharge status: Home / Self Care | Payer: Medicare Other | Attending: Emergency Medicine | Admitting: Emergency Medicine

## 2011-11-29 DIAGNOSIS — I779 Disorder of arteries and arterioles, unspecified: Secondary | ICD-10-CM | POA: Insufficient documentation

## 2011-11-29 DIAGNOSIS — H9209 Otalgia, unspecified ear: Secondary | ICD-10-CM | POA: Insufficient documentation

## 2011-11-29 DIAGNOSIS — I10 Essential (primary) hypertension: Secondary | ICD-10-CM | POA: Insufficient documentation

## 2011-11-29 DIAGNOSIS — Z8546 Personal history of malignant neoplasm of prostate: Secondary | ICD-10-CM | POA: Insufficient documentation

## 2011-11-29 DIAGNOSIS — Z79899 Other long term (current) drug therapy: Secondary | ICD-10-CM | POA: Insufficient documentation

## 2011-11-29 DIAGNOSIS — D649 Anemia, unspecified: Secondary | ICD-10-CM | POA: Insufficient documentation

## 2011-11-29 DIAGNOSIS — E785 Hyperlipidemia, unspecified: Secondary | ICD-10-CM | POA: Insufficient documentation

## 2011-11-29 DIAGNOSIS — R6883 Chills (without fever): Secondary | ICD-10-CM | POA: Insufficient documentation

## 2011-11-29 DIAGNOSIS — R42 Dizziness and giddiness: Secondary | ICD-10-CM | POA: Insufficient documentation

## 2011-11-29 HISTORY — DX: Disorder of kidney and ureter, unspecified: N28.9

## 2011-11-29 HISTORY — DX: Encounter for attention to other artificial openings: Z43.8

## 2011-11-29 LAB — BASIC METABOLIC PANEL
BUN: 20 mg/dL (ref 6–23)
CO2: 24 mEq/L (ref 19–32)
Calcium: 9.9 mg/dL (ref 8.4–10.5)
Chloride: 105 mEq/L (ref 96–112)
Creatinine, Ser: 1.99 mg/dL — ABNORMAL HIGH (ref 0.50–1.35)
GFR calc Af Amer: 36 mL/min — ABNORMAL LOW (ref >90)
GFR calc non Af Amer: 31 mL/min — ABNORMAL LOW (ref >90)
Glucose, Bld: 100 mg/dL — ABNORMAL HIGH (ref 70–99)
Potassium: 4.1 mEq/L (ref 3.5–5.1)
Sodium: 138 mEq/L (ref 135–145)

## 2011-11-29 LAB — CBC
HCT: 29.6 % — ABNORMAL LOW (ref 39.0–52.0)
Hemoglobin: 10.1 g/dL — ABNORMAL LOW (ref 13.0–17.0)
MCH: 31.4 pg (ref 26.0–34.0)
MCHC: 34.1 g/dL (ref 30.0–36.0)
MCV: 91.9 fL (ref 78.0–100.0)
Platelets: 174 10*3/uL (ref 150–400)
RBC: 3.22 MIL/uL — ABNORMAL LOW (ref 4.22–5.81)
RDW: 12.7 % (ref 11.5–15.5)
WBC: 5.2 10*3/uL (ref 4.0–10.5)

## 2011-11-29 LAB — DIFFERENTIAL
Basophils Absolute: 0 10*3/uL (ref 0.0–0.1)
Basophils Relative: 0 % (ref 0–1)
Eosinophils Absolute: 0.1 10*3/uL (ref 0.0–0.7)
Eosinophils Relative: 2 % (ref 0–5)
Lymphocytes Relative: 17 % (ref 12–46)
Lymphs Abs: 0.9 10*3/uL (ref 0.7–4.0)
Monocytes Absolute: 0.6 10*3/uL (ref 0.1–1.0)
Monocytes Relative: 12 % (ref 3–12)
Neutro Abs: 3.6 10*3/uL (ref 1.7–7.7)
Neutrophils Relative %: 69 % (ref 43–77)

## 2011-11-29 MED ORDER — MECLIZINE HCL 25 MG PO TABS: 25.0000 mg | ORAL_TABLET | Freq: Four times a day (QID) | ORAL | Status: AC

## 2011-11-29 MED ORDER — MECLIZINE HCL 12.5 MG PO TABS
25.0000 mg | ORAL_TABLET | Freq: Once | ORAL | Status: AC
  Administered 2011-11-29: 25 mg via ORAL
  Filled 2011-11-29: qty 2

## 2011-11-29 NOTE — ED Notes (Signed)
Says he has dizziness intermittently , worse today and felt chilled.  Alert, NAD, color good

## 2011-11-29 NOTE — ED Provider Notes (Signed)
History   This chart was scribed for [name] Cable, MD by Marin Comment . The patient was seen in room APA04/APA04 and the patient's care was started at 10:20pm.  CSN: [id number]  Arrival date & time 11/29/11  2133   First MD Initiated Contact with Patient 11/29/11 2219      Chief Complaint  Patient presents with  . Dizziness    HPI [name] [name] is a 77 y.o. male who presents to the Emergency Department complaining of constant, moderate dizziness with associated chills and mild ear pain since this morning when he woke up. He denies syncope, headache, vision changes, fever, weakness in extremities, or palpitations. He also stated having tinnitus, but not above baseline, he reports he gets this frequently. Patient stated that his has been taking blood pressure medication for the past 3 months, and hasn't missed any doses. He reported having these symptoms previously, but not this severe. Standing makes the symptoms worse. He denies any medication changes, or h/o stroke. No syncope  Denies CP at this time    Past Medical History  Diagnosis Date  . Hypertension   . Incontinence   . Reflux   . Hyperlipidemia   . Carotid artery occlusion   . Cancer     prostate ca 1999/removal/ rad tx  . 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   . Renal disorder     Past Surgical History  Procedure Date  . Radical prostectomy   . Hernia repair   . Cataracts     Family History  Problem Relation Age of Onset  . Coronary artery disease      family history, male < 72  . Arthritis      family history    History  Substance Use Topics  . Smoking status: Former Smoker    Quit date: 12/02/1976  . Smokeless tobacco: Not on file  . Alcohol Use: No      Review of Systems A complete 10 system review of systems was obtained and is otherwise negative except as noted in the HPI and PMH.   Allergies    Codeine; Ivp dye; and Nalbuphine  Home Medications   Current Outpatient Rx  Name Route Sig Dispense Refill  . BENAZEPRIL HCL 40 MG PO TABS Oral Take 40 mg by mouth daily.      . OMEGA-3 FATTY ACIDS 1000 MG PO CAPS Oral Take 2 g by mouth daily.      Marland Kitchen LANSOPRAZOLE 30 MG PO CPDR Oral Take 30 mg by mouth daily.      Marland Kitchen ROSUVASTATIN CALCIUM 10 MG PO TABS Oral Take 10 mg by mouth daily.        BP 167/71  Pulse 91  Temp 98.2 F (36.8 C)  Resp 20  Ht 5\' 8"  (1.727 m)  Wt 190 lb (86.183 kg)  BMI 28.89 kg/m2  SpO2 97%  Physical Exam CONSTITUTIONAL: Well developed/well nourished HEAD AND FACE: Normocephalic/atraumatic EYES: EOMI/PERRL ENMT: Mucous membranes moist, Left Tm/right TM normal NECK: supple no meningeal signs, no loud bruits noted SPINE:entire spine nontender CV: S1/S2 noted, no murmurs/rubs/gallops noted LUNGS: Lungs are clear to auscultation bilaterally, no apparent distress ABDOMEN: soft, nontender, no rebound or guarding GU:no cva tenderness NEURO: Awake/alert, facies symmetric, no arm or leg drift is noted Cranial nerves 3/4/5/6/06/09/09/11/12 tested and intact Gait normal, no past pointing, no nystagmus  EXTREMITIES: pulses normal, full ROM  SKIN: warm, color normal PSYCH: no abnormalities of mood noted  ED Course  Procedures  DIAGNOSTIC STUDIES: Oxygen Saturation is 97% on room air, normal by my interpretation.    COORDINATION OF CARE:   10:46 PM Pt well appearing, no distress, no neuro deficits, no HA, ambulatory and normal gait Will recheck creatinine as h/o renal insufficiency Doubt ACS at this time.  CVA is less likely given his exam Review of chart reveals recent negative CT head and also "mild cardotid plaque f/u in 2 yrs" after carotid ultrasound  11:44 PM Pt improved Anemia noted, but denies any recent GI bleed symptoms He is well appearing, no focal neuro deficits Doubt occult CVA Discussed at length signs/symptoms of when to return  MDM   Nursing notes reviewed and considered in documentation All labs/vitals reviewed and considered Previous records reviewed and considered  I personally performed the services described in this documentation, which was scribed in my presence. The recorded information has been reviewed and considered.         [name] Cable, MD 11/29/11 [phone]

## 2011-11-29 NOTE — ED Notes (Signed)
Alert, talking, Dr Christy Gentles in with pt

## 2011-11-29 NOTE — ED Notes (Signed)
Pt states that he has had problems dizziness off an on, worse today,  chills, denies any cough, weakness. pain

## 2011-11-29 NOTE — ED Notes (Signed)
Says he feels "tense all over"

## 2011-12-17 DIAGNOSIS — Z8546 Personal history of malignant neoplasm of prostate: Secondary | ICD-10-CM | POA: Diagnosis not present

## 2011-12-17 DIAGNOSIS — N393 Stress incontinence (female) (male): Secondary | ICD-10-CM | POA: Insufficient documentation

## 2011-12-17 DIAGNOSIS — N289 Disorder of kidney and ureter, unspecified: Secondary | ICD-10-CM | POA: Diagnosis not present

## 2011-12-17 DIAGNOSIS — N529 Male erectile dysfunction, unspecified: Secondary | ICD-10-CM | POA: Insufficient documentation

## 2011-12-31 DIAGNOSIS — I10 Essential (primary) hypertension: Secondary | ICD-10-CM | POA: Diagnosis not present

## 2011-12-31 DIAGNOSIS — I2510 Atherosclerotic heart disease of native coronary artery without angina pectoris: Secondary | ICD-10-CM | POA: Diagnosis not present

## 2011-12-31 DIAGNOSIS — R42 Dizziness and giddiness: Secondary | ICD-10-CM | POA: Diagnosis not present

## 2011-12-31 DIAGNOSIS — N19 Unspecified kidney failure: Secondary | ICD-10-CM | POA: Diagnosis not present

## 2012-01-21 ENCOUNTER — Ambulatory Visit (HOSPITAL_COMMUNITY)
Admission: RE | Admit: 2012-01-21 | Discharge: 2012-01-21 | Disposition: A | Discharge status: Home / Self Care | Payer: MEDICARE | Source: Ambulatory Visit | Attending: Nephrology | Admitting: Nephrology

## 2012-01-21 DIAGNOSIS — I10 Essential (primary) hypertension: Secondary | ICD-10-CM | POA: Diagnosis not present

## 2012-01-21 DIAGNOSIS — R002 Palpitations: Secondary | ICD-10-CM | POA: Insufficient documentation

## 2012-01-21 DIAGNOSIS — R42 Dizziness and giddiness: Secondary | ICD-10-CM | POA: Insufficient documentation

## 2012-01-21 NOTE — Progress Notes (Signed)
*  PRELIMINARY RESULTS* Echocardiogram 2D Echocardiogram has been performed.  Tera Partridge 01/21/2012, 2:50 PM

## 2012-01-26 ENCOUNTER — Other Ambulatory Visit: Payer: Self-pay

## 2012-01-26 ENCOUNTER — Encounter (HOSPITAL_COMMUNITY): Payer: Self-pay | Admitting: Emergency Medicine

## 2012-01-26 ENCOUNTER — Emergency Department (HOSPITAL_COMMUNITY)
Admission: EM | Admit: 2012-01-26 | Discharge: 2012-01-26 | Disposition: A | Discharge status: Home Health | Payer: MEDICARE | Attending: Emergency Medicine | Admitting: Emergency Medicine

## 2012-01-26 DIAGNOSIS — E785 Hyperlipidemia, unspecified: Secondary | ICD-10-CM | POA: Diagnosis not present

## 2012-01-26 DIAGNOSIS — R5381 Other malaise: Secondary | ICD-10-CM | POA: Diagnosis not present

## 2012-01-26 DIAGNOSIS — I10 Essential (primary) hypertension: Secondary | ICD-10-CM | POA: Diagnosis not present

## 2012-01-26 DIAGNOSIS — R531 Weakness: Secondary | ICD-10-CM

## 2012-01-26 DIAGNOSIS — K219 Gastro-esophageal reflux disease without esophagitis: Secondary | ICD-10-CM | POA: Diagnosis not present

## 2012-01-26 DIAGNOSIS — E860 Dehydration: Secondary | ICD-10-CM | POA: Diagnosis not present

## 2012-01-26 LAB — COMPREHENSIVE METABOLIC PANEL
ALT: 13 U/L (ref 0–53)
AST: 19 U/L (ref 0–37)
Albumin: 4 g/dL (ref 3.5–5.2)
Alkaline Phosphatase: 72 U/L (ref 39–117)
BUN: 24 mg/dL — ABNORMAL HIGH (ref 6–23)
CO2: 23 mEq/L (ref 19–32)
Calcium: 9.8 mg/dL (ref 8.4–10.5)
Chloride: 103 mEq/L (ref 96–112)
Creatinine, Ser: 1.97 mg/dL — ABNORMAL HIGH (ref 0.50–1.35)
GFR calc Af Amer: 37 mL/min — ABNORMAL LOW (ref >90)
GFR calc non Af Amer: 31 mL/min — ABNORMAL LOW (ref >90)
Glucose, Bld: 116 mg/dL — ABNORMAL HIGH (ref 70–99)
Potassium: 3.9 mEq/L (ref 3.5–5.1)
Sodium: 138 mEq/L (ref 135–145)
Total Bilirubin: 0.5 mg/dL (ref 0.3–1.2)
Total Protein: 8.1 g/dL (ref 6.0–8.3)

## 2012-01-26 LAB — CBC
HCT: 38.2 % — ABNORMAL LOW (ref 39.0–52.0)
Hemoglobin: 12.9 g/dL — ABNORMAL LOW (ref 13.0–17.0)
MCH: 30.5 pg (ref 26.0–34.0)
MCHC: 33.8 g/dL (ref 30.0–36.0)
MCV: 90.3 fL (ref 78.0–100.0)
Platelets: 195 10*3/uL (ref 150–400)
RBC: 4.23 MIL/uL (ref 4.22–5.81)
RDW: 12.8 % (ref 11.5–15.5)
WBC: 9.3 10*3/uL (ref 4.0–10.5)

## 2012-01-26 LAB — DIFFERENTIAL
Basophils Absolute: 0 10*3/uL (ref 0.0–0.1)
Basophils Relative: 0 % (ref 0–1)
Eosinophils Absolute: 0.1 10*3/uL (ref 0.0–0.7)
Eosinophils Relative: 1 % (ref 0–5)
Lymphocytes Relative: 4 % — ABNORMAL LOW (ref 12–46)
Lymphs Abs: 0.4 10*3/uL — ABNORMAL LOW (ref 0.7–4.0)
Monocytes Absolute: 0.4 10*3/uL (ref 0.1–1.0)
Monocytes Relative: 4 % (ref 3–12)
Neutro Abs: 8.5 10*3/uL — ABNORMAL HIGH (ref 1.7–7.7)
Neutrophils Relative %: 91 % — ABNORMAL HIGH (ref 43–77)

## 2012-01-26 LAB — TROPONIN I: Troponin I: <0.3 ng/mL (ref <0.30)

## 2012-01-26 MED ORDER — SODIUM CHLORIDE 0.9 % IV SOLN
Freq: Once | INTRAVENOUS | Status: AC
  Administered 2012-01-26: 10:00:00 via INTRAVENOUS

## 2012-01-26 NOTE — ED Notes (Signed)
Patient with c/o tachycardia and low blood pressure. Patient reports this problem has been occuring since October. Patient has been on HTN meds and reports the doctor has been increasing them. Patient states his blood pressure averages 0000000 systolic. Patient has been checking his blood pressure and states it has been in the [id number] systolic. Patient denies chest pain, shortness of breath. Sinus tach on monitor.

## 2012-01-26 NOTE — ED Notes (Signed)
Pt a/ox4. Resp even and unlabored. NAD at this time. Pt denies pain and SOB. Pt on cardiac monitor. Awaiting further orders. Will continue to monitor.

## 2012-01-26 NOTE — ED Provider Notes (Signed)
History   This chart was scribed for Veryl Speak, MD by Carolyne Littles. The patient was seen in room APA14/APA14. Patient's care was started at [facility].    CSN: [id number]  Arrival date & time 01/26/12  [id number]   First MD Initiated Contact with Patient 01/26/12 [phone]      Chief Complaint  Patient presents with  . Tachycardia    (Consider location/radiation/quality/duration/timing/severity/associated sxs/prior treatment) HPI [name] [name] is a 78 y.o. male who presents to the Emergency Department complaining of constant moderate generalized weakness onset this morning after awaking and persistent since with no associated symptoms. States he has experienced similar weakness previously approximately 1.5-2 months ago. Denies nausea, vomiting, diarrhea, fever, chest pain, SOB recent sick contacts. Patient also notes his blood pressure has been running in the [id number] systolically recently when it normally runs in the 140's. Patient with h/o HTN, HLD, Carotid artery occlusion, CA, renal disorder.  Past Medical History  Diagnosis Date  . Hypertension   . Incontinence   . Reflux   . Hyperlipidemia   . Carotid artery occlusion   . Cancer     prostate ca 1999/removal/ rad tx  . 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   . Renal disorder     Past Surgical History  Procedure Date  . Radical prostectomy   . Hernia repair   . Cataracts     Family History  Problem Relation Age of Onset  . Coronary artery disease      family history, male < 69  . Arthritis      family history    History  Substance Use Topics  . Smoking status: Former Smoker    Quit date: 12/02/1976  . Smokeless tobacco: Not on file  . Alcohol Use: No      Review of Systems 10 Systems reviewed and are negative for acute change except as noted in the HPI.  Allergies  Ivp dye; Nalbuphine; and Codeine  Home Medications   Current Outpatient Rx   Name Route Sig Dispense Refill  . ACETAMINOPHEN 500 MG PO TABS Oral Take 500-1,000 mg by mouth as needed. For headache pain     . AMLODIPINE BESYLATE 10 MG PO TABS Oral Take 10 mg by mouth every morning.      . ASPIRIN EC 81 MG PO TBEC Oral Take 81 mg by mouth daily.      Marland Kitchen BENAZEPRIL HCL 40 MG PO TABS Oral Take 40 mg by mouth daily.      . OMEGA-3 FATTY ACIDS 1000 MG PO CAPS Oral Take 1 g by mouth 2 (two) times daily.     Marland Kitchen HYDRALAZINE HCL 25 MG PO TABS Oral Take 25 mg by mouth 2 (two) times daily.      Marland Kitchen LABETALOL HCL 100 MG PO TABS Oral Take 100 mg by mouth 2 (two) times daily.      Marland Kitchen LANSOPRAZOLE 30 MG PO CPDR Oral Take 30 mg by mouth daily.      . METHYLCELLULOSE (LAXATIVE) 500 MG PO TABS Oral Take 1 tablet by mouth daily.      Marland Kitchen ROSUVASTATIN CALCIUM 10 MG PO TABS Oral Take 10 mg by mouth daily.        BP 153/66  Pulse 94  Temp(Src) 98.4 F (36.9 C) (Oral)  Resp 16  Ht 5\' 8"  (1.727 m)  Wt 185 lb (83.915 kg)  BMI 28.13 kg/m2  SpO2 97%  Physical Exam  Nursing note and vitals reviewed. Constitutional: He is oriented to person, place, and time. He appears well-developed and well-nourished. No distress.  HENT:  Head: Normocephalic and atraumatic.  Eyes: EOM are normal. Pupils are equal, round, and reactive to light.  Neck: Neck supple. No tracheal deviation present.  Cardiovascular: Regular rhythm.  Tachycardia present.  Exam reveals no gallop and no friction rub.   No murmur heard. Pulmonary/Chest: Effort normal. No respiratory distress. He has no wheezes. He has no rales.  Abdominal: Soft. He exhibits no distension. There is no tenderness.  Musculoskeletal: Normal range of motion. He exhibits no edema.  Neurological: He is alert and oriented to person, place, and time. No sensory deficit.  Skin: Skin is warm and dry.  Psychiatric: He has a normal mood and affect. His behavior is normal.    ED Course  Procedures (including critical care time)  DIAGNOSTIC STUDIES: Oxygen  Saturation is 97% on room air, normal by my interpretation.    COORDINATION OF CARE: 9:59AM- Patient informed of current plan for treatment and evaluation and agrees with plan at this time.     Results for orders placed during the hospital encounter of 01/26/12  CBC      Component Value Range   WBC 9.3  4.0 - 10.5 (K/uL)   RBC 4.23  4.22 - 5.81 (MIL/uL)   Hemoglobin 12.9 (*) 13.0 - 17.0 (g/dL)   HCT 38.2 (*) 39.0 - 52.0 (%)   MCV 90.3  78.0 - 100.0 (fL)   MCH 30.5  26.0 - 34.0 (pg)   MCHC 33.8  30.0 - 36.0 (g/dL)   RDW 12.8  11.5 - 15.5 (%)   Platelets 195  150 - 400 (K/uL)  DIFFERENTIAL      Component Value Range   Neutrophils Relative 91 (*) 43 - 77 (%)   Neutro Abs 8.5 (*) 1.7 - 7.7 (K/uL)   Lymphocytes Relative 4 (*) 12 - 46 (%)   Lymphs Abs 0.4 (*) 0.7 - 4.0 (K/uL)   Monocytes Relative 4  3 - 12 (%)   Monocytes Absolute 0.4  0.1 - 1.0 (K/uL)   Eosinophils Relative 1  0 - 5 (%)   Eosinophils Absolute 0.1  0.0 - 0.7 (K/uL)   Basophils Relative 0  0 - 1 (%)   Basophils Absolute 0.0  0.0 - 0.1 (K/uL)  COMPREHENSIVE METABOLIC PANEL      Component Value Range   Sodium 138  135 - 145 (mEq/L)   Potassium 3.9  3.5 - 5.1 (mEq/L)   Chloride 103  96 - 112 (mEq/L)   CO2 23  19 - 32 (mEq/L)   Glucose, Bld 116 (*) 70 - 99 (mg/dL)   BUN 24 (*) 6 - 23 (mg/dL)   Creatinine, Ser 1.97 (*) 0.50 - 1.35 (mg/dL)   Calcium 9.8  8.4 - 10.5 (mg/dL)   Total Protein 8.1  6.0 - 8.3 (g/dL)   Albumin 4.0  3.5 - 5.2 (g/dL)   AST 19  0 - 37 (U/L)   ALT 13  0 - 53 (U/L)   Alkaline Phosphatase 72  39 - 117 (U/L)   Total Bilirubin 0.5  0.3 - 1.2 (mg/dL)   GFR calc non Af Amer 31 (*) >90 (mL/min)   GFR calc Af Amer 37 (*) >90 (mL/min)  TROPONIN I      Component Value Range   Troponin I <0.30  <0.30 (ng/mL)    No results found.  No diagnosis found.   Date: 01/26/2012  Rate: 96  Rhythm: normal sinus rhythm  QRS Axis: normal  Intervals: normal  ST/T Wave abnormalities: normal   Conduction Disutrbances:none  Narrative Interpretation:   Old EKG Reviewed: unchanged    MDM  The labs all look okay.  Patient given ivf's and seems fine.  Will discharge to home.  To return prn.        I personally performed the services described in this documentation, which was scribed in my presence. The recorded information has been reviewed and considered.      Veryl Speak, MD 01/26/12 1154

## 2012-01-26 NOTE — Discharge Instructions (Signed)

## 2012-01-28 DIAGNOSIS — R197 Diarrhea, unspecified: Secondary | ICD-10-CM | POA: Diagnosis not present

## 2012-01-28 DIAGNOSIS — N19 Unspecified kidney failure: Secondary | ICD-10-CM | POA: Diagnosis not present

## 2012-01-28 DIAGNOSIS — I10 Essential (primary) hypertension: Secondary | ICD-10-CM | POA: Diagnosis not present

## 2012-01-28 DIAGNOSIS — K2100 Gastro-esophageal reflux disease with esophagitis, without bleeding: Secondary | ICD-10-CM | POA: Diagnosis not present

## 2012-02-19 DIAGNOSIS — Q828 Other specified congenital malformations of skin: Secondary | ICD-10-CM | POA: Diagnosis not present

## 2012-02-24 DIAGNOSIS — F4323 Adjustment disorder with mixed anxiety and depressed mood: Secondary | ICD-10-CM | POA: Diagnosis not present

## 2012-02-27 DIAGNOSIS — C61 Malignant neoplasm of prostate: Secondary | ICD-10-CM | POA: Diagnosis not present

## 2012-03-05 DIAGNOSIS — C61 Malignant neoplasm of prostate: Secondary | ICD-10-CM | POA: Diagnosis not present

## 2012-03-10 DIAGNOSIS — F4323 Adjustment disorder with mixed anxiety and depressed mood: Secondary | ICD-10-CM | POA: Diagnosis not present

## 2012-03-19 DIAGNOSIS — I10 Essential (primary) hypertension: Secondary | ICD-10-CM | POA: Diagnosis not present

## 2012-03-19 DIAGNOSIS — Z79899 Other long term (current) drug therapy: Secondary | ICD-10-CM | POA: Diagnosis not present

## 2012-03-19 DIAGNOSIS — N189 Chronic kidney disease, unspecified: Secondary | ICD-10-CM | POA: Diagnosis not present

## 2012-03-19 DIAGNOSIS — D649 Anemia, unspecified: Secondary | ICD-10-CM | POA: Diagnosis not present

## 2012-03-24 DIAGNOSIS — N183 Chronic kidney disease, stage 3 unspecified: Secondary | ICD-10-CM | POA: Diagnosis not present

## 2012-03-24 DIAGNOSIS — I10 Essential (primary) hypertension: Secondary | ICD-10-CM | POA: Diagnosis not present

## 2012-03-24 DIAGNOSIS — R809 Proteinuria, unspecified: Secondary | ICD-10-CM | POA: Diagnosis not present

## 2012-03-24 DIAGNOSIS — D649 Anemia, unspecified: Secondary | ICD-10-CM | POA: Diagnosis not present

## 2012-04-18 ENCOUNTER — Emergency Department (HOSPITAL_COMMUNITY): Payer: MEDICARE

## 2012-04-18 ENCOUNTER — Emergency Department (HOSPITAL_COMMUNITY)
Admission: EM | Admit: 2012-04-18 | Discharge: 2012-04-18 | Disposition: A | Discharge status: Home / Self Care | Payer: MEDICARE | Attending: Emergency Medicine | Admitting: Emergency Medicine

## 2012-04-18 ENCOUNTER — Encounter (HOSPITAL_COMMUNITY): Payer: Self-pay | Admitting: Emergency Medicine

## 2012-04-18 DIAGNOSIS — E785 Hyperlipidemia, unspecified: Secondary | ICD-10-CM | POA: Diagnosis not present

## 2012-04-18 DIAGNOSIS — M25561 Pain in right knee: Secondary | ICD-10-CM

## 2012-04-18 DIAGNOSIS — I10 Essential (primary) hypertension: Secondary | ICD-10-CM | POA: Diagnosis not present

## 2012-04-18 DIAGNOSIS — M25569 Pain in unspecified knee: Secondary | ICD-10-CM | POA: Insufficient documentation

## 2012-04-18 MED ORDER — ONDANSETRON 4 MG PO TBDP: 4.0000 mg | ORAL_TABLET | Freq: Three times a day (TID) | ORAL | Status: AC | PRN

## 2012-04-18 MED ORDER — HYDROCODONE-ACETAMINOPHEN 5-325 MG PO TABS: 1.0000 | ORAL_TABLET | Freq: Four times a day (QID) | ORAL | Status: DC | PRN

## 2012-04-18 NOTE — ED Provider Notes (Signed)
History   This chart was scribed for [name] Kung, MD by Carolyne Littles. The patient was seen in room [facility]. Patient's care was started at 1024.    CSN: [id number]  Arrival date & time 04/18/12  61   First MD Initiated Contact with Patient 04/18/12 1028      Chief Complaint  Patient presents with  . Knee Pain    (Consider location/radiation/quality/duration/timing/severity/associated sxs/prior treatment) HPI [name] [name] is a 78 y.o. male who presents to the Emergency Department complaining of constant moderate to severe diffuse anterior right knee pain radiating to proximal aspect of lower leg onset 3 days ago and persistent since. Rates pain a 9 out of 10 currently.Reports pain is moderately aggravated with certain movements and mildly aggravated with walking. States pain not relieved with use of pain medication but is relieved by keeping right knee joint extended. Patient reports experiencing right knee pain previously but states current pain is significantly more severe than that previously experienced. Denies recent injury/trauma, numbness, tingling, fever, chills, HA, neck pain, back pain, sore throat, chest pain, SOB, abdominal pain, nausea, vomiting, diarrhea, dysuria, rash. Patient with h/o HTN, HLD, carotid artery occlusion, renal disorder.  PCP- Luan Pulling  Past Medical History  Diagnosis Date  . Hypertension   . Incontinence   . Reflux   . Hyperlipidemia   . Carotid artery occlusion   . Cancer     prostate ca 1999/removal/ rad tx  . 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   . Renal disorder     Past Surgical History  Procedure Date  . Radical prostectomy   . Hernia repair   . Cataracts     Family History  Problem Relation Age of Onset  . Coronary artery disease      family history, male < 68  . Arthritis      family history    History  Substance Use Topics  . Smoking  status: Former Smoker    Quit date: 12/02/1976  . Smokeless tobacco: Not on file  . Alcohol Use: No      Review of Systems  Constitutional: Negative for fever and chills.  HENT: Negative for rhinorrhea and neck pain.   Eyes: Negative for pain.  Respiratory: Negative for cough and shortness of breath.   Cardiovascular: Negative for chest pain.  Gastrointestinal: Negative for nausea, vomiting, abdominal pain and diarrhea.  Genitourinary: Negative for dysuria.  Musculoskeletal: Positive for arthralgias. Negative for back pain.       Knee pain.   Skin: Negative for rash.  Neurological: Negative for dizziness, weakness and numbness.    Allergies  Ivp dye; Nalbuphine; and Codeine  Home Medications   Current Outpatient Rx  Name Route Sig Dispense Refill  . ACETAMINOPHEN 500 MG PO TABS Oral Take 500-1,000 mg by mouth as needed. For headache pain     . AMLODIPINE BESYLATE 10 MG PO TABS Oral Take 10 mg by mouth every morning.      . ASPIRIN EC 81 MG PO TBEC Oral Take 81 mg by mouth daily.      Marland Kitchen BENAZEPRIL HCL 40 MG PO TABS Oral Take 40 mg by mouth daily.      . OMEGA-3 FATTY ACIDS 1000 MG PO CAPS Oral Take 1 g by mouth 2 (two) times daily.     Marland Kitchen HYDROCODONE-ACETAMINOPHEN 5-325 MG PO TABS Oral Take 1-2 tablets by mouth every 6 (  six) hours as needed for pain. 14 tablet 0  . LANSOPRAZOLE 30 MG PO CPDR Oral Take 30 mg by mouth daily.      . METHYLCELLULOSE (LAXATIVE) 500 MG PO TABS Oral Take 1 tablet by mouth daily.      Marland Kitchen ONDANSETRON 4 MG PO TBDP Oral Take 1 tablet (4 mg total) by mouth every 8 (eight) hours as needed for nausea. 12 tablet 0  . ROSUVASTATIN CALCIUM 10 MG PO TABS Oral Take 10 mg by mouth every morning.       BP 169/66  Pulse 75  Temp(Src) 98.2 F (36.8 C) (Oral)  Resp 21  Ht 5\' 8"  (1.727 m)  Wt 180 lb (81.647 kg)  BMI 27.37 kg/m2  SpO2 100%  Physical Exam  Nursing note and vitals reviewed. Constitutional: He is oriented to person, place, and time. He appears  well-developed and well-nourished. No distress.  HENT:  Head: Normocephalic and atraumatic.  Eyes: EOM are normal. Pupils are equal, round, and reactive to light.  Neck: Neck supple. No tracheal deviation present.  Cardiovascular: Normal rate and regular rhythm.  Exam reveals no gallop and no friction rub.   No murmur heard. Pulmonary/Chest: Effort normal. No respiratory distress. He has no wheezes. He has no rales.  Abdominal: Soft. Bowel sounds are normal. He exhibits no distension. There is no tenderness.  Musculoskeletal: Normal range of motion.       Right knee with mild swelling. Tenderness along joint line of right knee. Right calf non-tender.   Neurological: He is alert and oriented to person, place, and time. No sensory deficit.  Skin: Skin is warm and dry.  Psychiatric: He has a normal mood and affect. His behavior is normal.    ED Course  Procedures (including critical care time)  DIAGNOSTIC STUDIES: Oxygen Saturation is 100% on room air, normal by my interpretation.    COORDINATION OF CARE: 11:18AM- Patient informed of current plan for treatment and evaluation and agrees with plan at this time.     Labs Reviewed - No data to display Dg Knee Complete 4 Views Right  04/18/2012  *RADIOLOGY REPORT*  Knee pain.  No history of injury.  RIGHT KNEE - COMPLETE 4+ VIEW  Comparison: None.  Findings: Multiple views of the right knee demonstrate no acute fracture, subluxation, dislocation, joint or soft tissue abnormality.  There is a small enthesophyte is extending off the superior aspect of the patella.  A small sclerotic focus likely represent a bone island is noted in the lateral femoral condyle superiorly.  IMPRESSION: 1.  No acute radiographic abnormality of the right knee.  Original Report Authenticated By: Etheleen Mayhew, M.D.     1. Knee pain, acute, right       MDM  Right knee pain since Thursday no injury x-rays without any acute bony abnormalities slight swelling  to the knee we'll treat with knee immobilizer pain medicine and followup with orthopedics. X-ray showed no evidence of fracture or acute arthritis or degenerative changes.      I personally performed the services described in this documentation, which was scribed in my presence. The recorded information has been reviewed and considered.     [name] Kung, MD 04/18/12 1136

## 2012-04-18 NOTE — ED Notes (Signed)
Pt c/o rt knee pain and denies any injury to that area. Pt states the knee has been hurting since Thursday.

## 2012-04-18 NOTE — Discharge Instructions (Signed)
Wear knee immobilizer at all times may remove it for the shower. Give Dr. Aline Brochure a call on Monday for followup. Take pain medicine as directed can use antinausea medicine if needed. Return for new or worse symptoms.

## 2012-04-21 ENCOUNTER — Ambulatory Visit (INDEPENDENT_AMBULATORY_CARE_PROVIDER_SITE_OTHER): Payer: MEDICARE | Admitting: Orthopedic Surgery

## 2012-04-21 ENCOUNTER — Encounter: Payer: Self-pay | Admitting: Orthopedic Surgery

## 2012-04-21 VITALS — BP 110/50 | Ht 68.0 in | Wt 180.0 lb

## 2012-04-21 DIAGNOSIS — M25469 Effusion, unspecified knee: Secondary | ICD-10-CM | POA: Diagnosis not present

## 2012-04-21 DIAGNOSIS — M25461 Effusion, right knee: Secondary | ICD-10-CM | POA: Insufficient documentation

## 2012-04-21 MED ORDER — HYDROCODONE-ACETAMINOPHEN 5-325 MG PO TABS: 1.0000 | ORAL_TABLET | Freq: Four times a day (QID) | ORAL | Status: AC | PRN

## 2012-04-21 NOTE — Progress Notes (Signed)
  Subjective:    [name] [name] is a 78 y.o. male who presents with a new problem involving his right knee which she says is very sore. His symptoms started on April 17 after he was playing some golf he started having gradual onset of pain in the right knee primarily in the patellofemoral area with swelling throbbing stabbing pain 8/10 intermittent improved with extension of the knee and worse with bending associated with catching.  Review of systems unexpected weight loss bordering of the eyes blurred vision palpitations snoring heartburn frequency dizziness anxiety depression easy bruising as excessive urination skin is flaking.  Medical history is recorded  Examination vital signs are stable general appearance is normal he is oriented x3 his mood is pleasant he is ambulating limping with a brace. The right lower extremity is swollen at the knee joint with tenderness to palpation around the patellofemoral joint not around the joint lines. Range of motion is limited to 60 of flexion he does perform straight leg raise normally the knee is stable. The skin is intact without rash pulses normal sensation is normal and coordination and balance are normal  The x-ray at the hospital was normal  The knee was aspirated and we obtained 12 cc of fluid it was clear that it had cartilage particles floating in it. We injected with cortisone. 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 Arthrocentesis. See procedure note. Upon economy hinged brace and Vicodin for pain   Aspiration RIGHT knee  Verbal consent  Time out  Lateral approach  Alcohol prep.  Ethyl chloride anesthesia  Needle was introduced through the lateral suprapatellar approach.  We aspirated 12 cc of clear fluid with cartilage particles. This was followed by injection with cortisone No complications

## 2012-04-21 NOTE — Patient Instructions (Signed)
You have received a steroid shot. 15% of patients experience increased pain at the injection site with in the next 24 hours. This is best treated with ice and tylenol extra strength 2 tabs every 8 hours. If you are still having pain please call the office.   Apply ice 4 x a day for 30 minutes   Wear brace until next visit

## 2012-04-23 DIAGNOSIS — F4323 Adjustment disorder with mixed anxiety and depressed mood: Secondary | ICD-10-CM | POA: Diagnosis not present

## 2012-04-28 ENCOUNTER — Telehealth: Payer: Self-pay | Admitting: Orthopedic Surgery

## 2012-04-28 NOTE — Telephone Encounter (Signed)
Patient called to relay that his right knee is about 95% improved from his visit on 04/21/12.  His next scheduled appointment is 05/12/12.  He's asking if he may play golf?  Also, need to continue to wear brace?  Ph# [phone]

## 2012-04-28 NOTE — Telephone Encounter (Signed)
Called back to patient, relayed. 

## 2012-04-28 NOTE — Telephone Encounter (Signed)
Yes. Yes.

## 2012-05-12 ENCOUNTER — Ambulatory Visit (INDEPENDENT_AMBULATORY_CARE_PROVIDER_SITE_OTHER): Payer: MEDICARE | Admitting: Orthopedic Surgery

## 2012-05-12 ENCOUNTER — Encounter: Payer: Self-pay | Admitting: Orthopedic Surgery

## 2012-05-12 VITALS — BP 112/60 | Ht 68.0 in | Wt 180.0 lb

## 2012-05-12 DIAGNOSIS — M25469 Effusion, unspecified knee: Secondary | ICD-10-CM

## 2012-05-12 DIAGNOSIS — M25461 Effusion, right knee: Secondary | ICD-10-CM

## 2012-05-12 NOTE — Patient Instructions (Signed)
activities as tolerated 

## 2012-05-12 NOTE — Progress Notes (Signed)
Patient ID: [name] [name], male   DOB: Jul 24, 1934, 78 y.o.   MRN: [id number] Chief Complaint  Patient presents with  . Follow-up    3 week recheck right knee   BP 112/60  Ht 5\' 8"  (1.727 m)  Wt 180 lb (81.647 kg)  BMI 27.37 kg/m2  Status post aspiration injection of RIGHT knee for a fusion doing well. No pain at this time a porcine 9% improvement.  He looks good. No swelling no warmth no redness. Range of motion is free and easy.  Follow up as needed activities as tolerated brace can be removed.

## 2012-06-08 DIAGNOSIS — F4323 Adjustment disorder with mixed anxiety and depressed mood: Secondary | ICD-10-CM | POA: Diagnosis not present

## 2012-06-30 DIAGNOSIS — C61 Malignant neoplasm of prostate: Secondary | ICD-10-CM | POA: Diagnosis not present

## 2012-07-07 DIAGNOSIS — C61 Malignant neoplasm of prostate: Secondary | ICD-10-CM | POA: Diagnosis not present

## 2012-07-21 DIAGNOSIS — I10 Essential (primary) hypertension: Secondary | ICD-10-CM | POA: Diagnosis not present

## 2012-07-21 DIAGNOSIS — E785 Hyperlipidemia, unspecified: Secondary | ICD-10-CM | POA: Diagnosis not present

## 2012-07-21 DIAGNOSIS — R5383 Other fatigue: Secondary | ICD-10-CM | POA: Diagnosis not present

## 2012-07-21 DIAGNOSIS — F3289 Other specified depressive episodes: Secondary | ICD-10-CM | POA: Diagnosis not present

## 2012-07-21 DIAGNOSIS — R5381 Other malaise: Secondary | ICD-10-CM | POA: Diagnosis not present

## 2012-07-21 DIAGNOSIS — F329 Major depressive disorder, single episode, unspecified: Secondary | ICD-10-CM | POA: Diagnosis not present

## 2012-07-21 DIAGNOSIS — I259 Chronic ischemic heart disease, unspecified: Secondary | ICD-10-CM | POA: Diagnosis not present

## 2012-08-05 DIAGNOSIS — Z23 Encounter for immunization: Secondary | ICD-10-CM | POA: Diagnosis not present

## 2012-08-06 DIAGNOSIS — E559 Vitamin D deficiency, unspecified: Secondary | ICD-10-CM | POA: Diagnosis not present

## 2012-08-06 DIAGNOSIS — R809 Proteinuria, unspecified: Secondary | ICD-10-CM | POA: Diagnosis not present

## 2012-08-06 DIAGNOSIS — Z79899 Other long term (current) drug therapy: Secondary | ICD-10-CM | POA: Diagnosis not present

## 2012-08-06 DIAGNOSIS — D649 Anemia, unspecified: Secondary | ICD-10-CM | POA: Diagnosis not present

## 2012-08-06 DIAGNOSIS — N189 Chronic kidney disease, unspecified: Secondary | ICD-10-CM | POA: Diagnosis not present

## 2012-08-11 DIAGNOSIS — N183 Chronic kidney disease, stage 3 unspecified: Secondary | ICD-10-CM | POA: Diagnosis not present

## 2012-08-11 DIAGNOSIS — I10 Essential (primary) hypertension: Secondary | ICD-10-CM | POA: Diagnosis not present

## 2012-08-11 DIAGNOSIS — D649 Anemia, unspecified: Secondary | ICD-10-CM | POA: Diagnosis not present

## 2012-08-11 DIAGNOSIS — R809 Proteinuria, unspecified: Secondary | ICD-10-CM | POA: Diagnosis not present

## 2012-10-27 DIAGNOSIS — H353190 Nonexudative age-related macular degeneration, unspecified eye, stage unspecified: Secondary | ICD-10-CM | POA: Diagnosis not present

## 2012-10-28 ENCOUNTER — Encounter (INDEPENDENT_AMBULATORY_CARE_PROVIDER_SITE_OTHER): Payer: MEDICARE | Admitting: Ophthalmology

## 2012-10-28 DIAGNOSIS — I10 Essential (primary) hypertension: Secondary | ICD-10-CM | POA: Diagnosis not present

## 2012-10-28 DIAGNOSIS — K2100 Gastro-esophageal reflux disease with esophagitis, without bleeding: Secondary | ICD-10-CM | POA: Diagnosis not present

## 2012-10-28 DIAGNOSIS — E785 Hyperlipidemia, unspecified: Secondary | ICD-10-CM | POA: Diagnosis not present

## 2012-10-28 DIAGNOSIS — I259 Chronic ischemic heart disease, unspecified: Secondary | ICD-10-CM | POA: Diagnosis not present

## 2012-11-03 ENCOUNTER — Encounter (INDEPENDENT_AMBULATORY_CARE_PROVIDER_SITE_OTHER): Payer: MEDICARE | Admitting: Ophthalmology

## 2012-11-03 DIAGNOSIS — H26499 Other secondary cataract, unspecified eye: Secondary | ICD-10-CM | POA: Diagnosis not present

## 2012-11-03 DIAGNOSIS — H3530 Unspecified macular degeneration: Secondary | ICD-10-CM

## 2012-11-03 DIAGNOSIS — H35379 Puckering of macula, unspecified eye: Secondary | ICD-10-CM

## 2012-11-03 DIAGNOSIS — I10 Essential (primary) hypertension: Secondary | ICD-10-CM | POA: Diagnosis not present

## 2012-11-03 DIAGNOSIS — H43819 Vitreous degeneration, unspecified eye: Secondary | ICD-10-CM

## 2012-11-03 DIAGNOSIS — H35039 Hypertensive retinopathy, unspecified eye: Secondary | ICD-10-CM | POA: Diagnosis not present

## 2012-12-17 DIAGNOSIS — Z8546 Personal history of malignant neoplasm of prostate: Secondary | ICD-10-CM | POA: Diagnosis not present

## 2012-12-17 DIAGNOSIS — N393 Stress incontinence (female) (male): Secondary | ICD-10-CM | POA: Diagnosis not present

## 2012-12-17 DIAGNOSIS — N289 Disorder of kidney and ureter, unspecified: Secondary | ICD-10-CM | POA: Diagnosis not present

## 2012-12-17 DIAGNOSIS — N529 Male erectile dysfunction, unspecified: Secondary | ICD-10-CM | POA: Diagnosis not present

## 2012-12-21 DIAGNOSIS — R809 Proteinuria, unspecified: Secondary | ICD-10-CM | POA: Diagnosis not present

## 2012-12-21 DIAGNOSIS — D649 Anemia, unspecified: Secondary | ICD-10-CM | POA: Diagnosis not present

## 2012-12-21 DIAGNOSIS — E559 Vitamin D deficiency, unspecified: Secondary | ICD-10-CM | POA: Diagnosis not present

## 2012-12-21 DIAGNOSIS — Z79899 Other long term (current) drug therapy: Secondary | ICD-10-CM | POA: Diagnosis not present

## 2012-12-21 DIAGNOSIS — I10 Essential (primary) hypertension: Secondary | ICD-10-CM | POA: Diagnosis not present

## 2012-12-21 DIAGNOSIS — N189 Chronic kidney disease, unspecified: Secondary | ICD-10-CM | POA: Diagnosis not present

## 2012-12-29 DIAGNOSIS — N184 Chronic kidney disease, stage 4 (severe): Secondary | ICD-10-CM | POA: Diagnosis not present

## 2012-12-29 DIAGNOSIS — D649 Anemia, unspecified: Secondary | ICD-10-CM | POA: Diagnosis not present

## 2012-12-29 DIAGNOSIS — R634 Abnormal weight loss: Secondary | ICD-10-CM | POA: Diagnosis not present

## 2012-12-29 DIAGNOSIS — I10 Essential (primary) hypertension: Secondary | ICD-10-CM | POA: Diagnosis not present

## 2013-01-18 DIAGNOSIS — C61 Malignant neoplasm of prostate: Secondary | ICD-10-CM | POA: Diagnosis not present

## 2013-01-27 DIAGNOSIS — R634 Abnormal weight loss: Secondary | ICD-10-CM | POA: Diagnosis not present

## 2013-01-27 DIAGNOSIS — I10 Essential (primary) hypertension: Secondary | ICD-10-CM | POA: Diagnosis not present

## 2013-01-27 DIAGNOSIS — K2100 Gastro-esophageal reflux disease with esophagitis, without bleeding: Secondary | ICD-10-CM | POA: Diagnosis not present

## 2013-01-27 DIAGNOSIS — I259 Chronic ischemic heart disease, unspecified: Secondary | ICD-10-CM | POA: Diagnosis not present

## 2013-02-15 ENCOUNTER — Other Ambulatory Visit (HOSPITAL_COMMUNITY): Payer: Self-pay | Admitting: Pulmonary Disease

## 2013-02-15 DIAGNOSIS — R634 Abnormal weight loss: Secondary | ICD-10-CM

## 2013-02-15 DIAGNOSIS — R630 Anorexia: Secondary | ICD-10-CM

## 2013-02-17 ENCOUNTER — Ambulatory Visit (HOSPITAL_COMMUNITY)
Admission: RE | Admit: 2013-02-17 | Discharge: 2013-02-17 | Disposition: A | Discharge status: Home / Self Care | Payer: MEDICARE | Source: Ambulatory Visit | Attending: Pulmonary Disease | Admitting: Pulmonary Disease

## 2013-02-17 ENCOUNTER — Ambulatory Visit (HOSPITAL_COMMUNITY): Payer: MEDICARE

## 2013-02-17 DIAGNOSIS — K5730 Diverticulosis of large intestine without perforation or abscess without bleeding: Secondary | ICD-10-CM | POA: Diagnosis not present

## 2013-02-17 DIAGNOSIS — R634 Abnormal weight loss: Secondary | ICD-10-CM | POA: Diagnosis not present

## 2013-02-17 DIAGNOSIS — R630 Anorexia: Secondary | ICD-10-CM | POA: Insufficient documentation

## 2013-02-17 DIAGNOSIS — Z9089 Acquired absence of other organs: Secondary | ICD-10-CM | POA: Diagnosis not present

## 2013-03-09 DIAGNOSIS — R630 Anorexia: Secondary | ICD-10-CM | POA: Diagnosis not present

## 2013-03-09 DIAGNOSIS — N19 Unspecified kidney failure: Secondary | ICD-10-CM | POA: Diagnosis not present

## 2013-03-11 DIAGNOSIS — C61 Malignant neoplasm of prostate: Secondary | ICD-10-CM | POA: Diagnosis not present

## 2013-03-25 DIAGNOSIS — Z79899 Other long term (current) drug therapy: Secondary | ICD-10-CM | POA: Diagnosis not present

## 2013-03-25 DIAGNOSIS — R809 Proteinuria, unspecified: Secondary | ICD-10-CM | POA: Diagnosis not present

## 2013-03-25 DIAGNOSIS — D649 Anemia, unspecified: Secondary | ICD-10-CM | POA: Diagnosis not present

## 2013-03-25 DIAGNOSIS — N184 Chronic kidney disease, stage 4 (severe): Secondary | ICD-10-CM | POA: Diagnosis not present

## 2013-03-25 DIAGNOSIS — E559 Vitamin D deficiency, unspecified: Secondary | ICD-10-CM | POA: Diagnosis not present

## 2013-03-30 DIAGNOSIS — N184 Chronic kidney disease, stage 4 (severe): Secondary | ICD-10-CM | POA: Diagnosis not present

## 2013-03-30 DIAGNOSIS — F3289 Other specified depressive episodes: Secondary | ICD-10-CM | POA: Diagnosis not present

## 2013-03-30 DIAGNOSIS — I10 Essential (primary) hypertension: Secondary | ICD-10-CM | POA: Diagnosis not present

## 2013-03-30 DIAGNOSIS — R809 Proteinuria, unspecified: Secondary | ICD-10-CM | POA: Diagnosis not present

## 2013-03-30 DIAGNOSIS — F329 Major depressive disorder, single episode, unspecified: Secondary | ICD-10-CM | POA: Diagnosis not present

## 2013-03-30 DIAGNOSIS — D649 Anemia, unspecified: Secondary | ICD-10-CM | POA: Diagnosis not present

## 2013-05-05 ENCOUNTER — Ambulatory Visit (INDEPENDENT_AMBULATORY_CARE_PROVIDER_SITE_OTHER): Payer: MEDICARE | Admitting: Ophthalmology

## 2013-05-05 DIAGNOSIS — H4420 Degenerative myopia, unspecified eye: Secondary | ICD-10-CM | POA: Diagnosis not present

## 2013-05-05 DIAGNOSIS — H35379 Puckering of macula, unspecified eye: Secondary | ICD-10-CM | POA: Diagnosis not present

## 2013-05-05 DIAGNOSIS — I10 Essential (primary) hypertension: Secondary | ICD-10-CM

## 2013-05-05 DIAGNOSIS — H43819 Vitreous degeneration, unspecified eye: Secondary | ICD-10-CM

## 2013-05-05 DIAGNOSIS — H35039 Hypertensive retinopathy, unspecified eye: Secondary | ICD-10-CM

## 2013-05-05 DIAGNOSIS — H3530 Unspecified macular degeneration: Secondary | ICD-10-CM | POA: Diagnosis not present

## 2013-05-11 DIAGNOSIS — M1990 Unspecified osteoarthritis, unspecified site: Secondary | ICD-10-CM | POA: Diagnosis not present

## 2013-05-11 DIAGNOSIS — E785 Hyperlipidemia, unspecified: Secondary | ICD-10-CM | POA: Diagnosis not present

## 2013-05-11 DIAGNOSIS — F3289 Other specified depressive episodes: Secondary | ICD-10-CM | POA: Diagnosis not present

## 2013-05-11 DIAGNOSIS — F329 Major depressive disorder, single episode, unspecified: Secondary | ICD-10-CM | POA: Diagnosis not present

## 2013-05-11 DIAGNOSIS — I10 Essential (primary) hypertension: Secondary | ICD-10-CM | POA: Diagnosis not present

## 2013-06-03 DIAGNOSIS — R972 Elevated prostate specific antigen [PSA]: Secondary | ICD-10-CM | POA: Diagnosis not present

## 2013-06-09 DIAGNOSIS — G4730 Sleep apnea, unspecified: Secondary | ICD-10-CM | POA: Diagnosis not present

## 2013-06-09 DIAGNOSIS — F329 Major depressive disorder, single episode, unspecified: Secondary | ICD-10-CM | POA: Diagnosis not present

## 2013-06-09 DIAGNOSIS — I10 Essential (primary) hypertension: Secondary | ICD-10-CM | POA: Diagnosis not present

## 2013-06-09 DIAGNOSIS — F3289 Other specified depressive episodes: Secondary | ICD-10-CM | POA: Diagnosis not present

## 2013-06-09 DIAGNOSIS — I259 Chronic ischemic heart disease, unspecified: Secondary | ICD-10-CM | POA: Diagnosis not present

## 2013-06-10 DIAGNOSIS — C61 Malignant neoplasm of prostate: Secondary | ICD-10-CM | POA: Diagnosis not present

## 2013-06-15 ENCOUNTER — Other Ambulatory Visit (HOSPITAL_COMMUNITY): Payer: Self-pay

## 2013-06-15 DIAGNOSIS — G4730 Sleep apnea, unspecified: Secondary | ICD-10-CM

## 2013-06-16 ENCOUNTER — Ambulatory Visit: Payer: MEDICARE | Attending: Pulmonary Disease | Admitting: Sleep Medicine

## 2013-06-16 DIAGNOSIS — G4710 Hypersomnia, unspecified: Secondary | ICD-10-CM | POA: Insufficient documentation

## 2013-06-16 DIAGNOSIS — G4730 Sleep apnea, unspecified: Secondary | ICD-10-CM

## 2013-06-26 NOTE — Procedures (Signed)
[place] A. Merlene Laughter, MD     [email or web address]        NAME:  [name] [name], [name] [name]               ACCOUNT NO.:  [id number]  MEDICAL RECORD NO.:  [id number]          PATIENT TYPE:  OUT  LOCATION:  SLEEP LAB                     FACILITY:  [facility]  PHYSICIAN:  Koleman Marling A. Merlene Laughter, M.D. DATE OF BIRTH:  08-04-1934  DATE OF STUDY:  06/16/2013                           NOCTURNAL POLYSOMNOGRAM  REFERRING PHYSICIAN:  Percell Miller L. Luan Pulling, M.D.  INDICATION FOR STUDY:  A 79 year old man who presents with hypersomnia, fatigue, and snoring.  EPWORTH SLEEPINESS SCORE:  9.  BMI:  25  MEDICATIONS:  Prevacid, Crestor, amlodipine, fish oil, aspirin, Lamictal, Lexapro, benazepril, vitamin B12.  SLEEP ARCHITECTURE:  The total recording time is 379 minutes.  Sleep efficiency 48%.  Sleep latency is 116 minutes.  REM latency is 259 minutes.  Stage N1 is 15%, N2 of 84%, N3 of 0%, and REM sleep 1%.  RESPIRATORY DATA:  Baseline oxygen saturation is 97%, lowest saturation 90 during REM sleep. AHI is 0.3 and RDI also 0.3.  LIMB MOVEMENT SUMMARY:  PLM index 20.  ELECTROCARDIOGRAM SUMMARY:  Average heart rate is 58 with no significant dysrhythmias observed.  IMPRESSION: 1. Moderate periodic limb movement disorder sleep. 2. Abnormal sleep architecture with significantly reduced sleep     efficiency of unclear etiology.  RECOMMENDATION:  Consider treatment of periodic limb movement disorder sleep.  Thanks for this referral.   Landan Fedie A. Merlene Laughter, M.D.    KAD/MEDQ  D:  06/26/2013 11:51:21  T:  06/26/2013 13:42:40  Job:  [id number]

## 2013-07-27 ENCOUNTER — Encounter (INDEPENDENT_AMBULATORY_CARE_PROVIDER_SITE_OTHER): Payer: MEDICARE

## 2013-07-27 DIAGNOSIS — I6529 Occlusion and stenosis of unspecified carotid artery: Secondary | ICD-10-CM

## 2013-08-03 DIAGNOSIS — Z23 Encounter for immunization: Secondary | ICD-10-CM | POA: Diagnosis not present

## 2013-08-19 DIAGNOSIS — N189 Chronic kidney disease, unspecified: Secondary | ICD-10-CM | POA: Diagnosis not present

## 2013-08-19 DIAGNOSIS — R809 Proteinuria, unspecified: Secondary | ICD-10-CM | POA: Diagnosis not present

## 2013-08-19 DIAGNOSIS — D649 Anemia, unspecified: Secondary | ICD-10-CM | POA: Diagnosis not present

## 2013-08-19 DIAGNOSIS — Z79899 Other long term (current) drug therapy: Secondary | ICD-10-CM | POA: Diagnosis not present

## 2013-08-24 DIAGNOSIS — D649 Anemia, unspecified: Secondary | ICD-10-CM | POA: Diagnosis not present

## 2013-08-24 DIAGNOSIS — N183 Chronic kidney disease, stage 3 unspecified: Secondary | ICD-10-CM | POA: Diagnosis not present

## 2013-08-24 DIAGNOSIS — R809 Proteinuria, unspecified: Secondary | ICD-10-CM | POA: Diagnosis not present

## 2013-08-24 DIAGNOSIS — I10 Essential (primary) hypertension: Secondary | ICD-10-CM | POA: Diagnosis not present

## 2013-09-06 DIAGNOSIS — F4323 Adjustment disorder with mixed anxiety and depressed mood: Secondary | ICD-10-CM | POA: Diagnosis not present

## 2013-09-07 DIAGNOSIS — F4323 Adjustment disorder with mixed anxiety and depressed mood: Secondary | ICD-10-CM | POA: Diagnosis not present

## 2013-10-14 DIAGNOSIS — F4323 Adjustment disorder with mixed anxiety and depressed mood: Secondary | ICD-10-CM | POA: Diagnosis not present

## 2013-10-19 DIAGNOSIS — F4323 Adjustment disorder with mixed anxiety and depressed mood: Secondary | ICD-10-CM | POA: Diagnosis not present

## 2013-11-17 DIAGNOSIS — F4323 Adjustment disorder with mixed anxiety and depressed mood: Secondary | ICD-10-CM | POA: Diagnosis not present

## 2013-12-07 DIAGNOSIS — F4323 Adjustment disorder with mixed anxiety and depressed mood: Secondary | ICD-10-CM | POA: Diagnosis not present

## 2014-01-06 DIAGNOSIS — F4323 Adjustment disorder with mixed anxiety and depressed mood: Secondary | ICD-10-CM | POA: Diagnosis not present

## 2014-02-01 DIAGNOSIS — F4323 Adjustment disorder with mixed anxiety and depressed mood: Secondary | ICD-10-CM | POA: Diagnosis not present

## 2014-02-14 DIAGNOSIS — F329 Major depressive disorder, single episode, unspecified: Secondary | ICD-10-CM | POA: Diagnosis not present

## 2014-02-14 DIAGNOSIS — I10 Essential (primary) hypertension: Secondary | ICD-10-CM | POA: Diagnosis not present

## 2014-02-14 DIAGNOSIS — F3289 Other specified depressive episodes: Secondary | ICD-10-CM | POA: Diagnosis not present

## 2014-02-14 DIAGNOSIS — N19 Unspecified kidney failure: Secondary | ICD-10-CM | POA: Diagnosis not present

## 2014-02-14 DIAGNOSIS — R5381 Other malaise: Secondary | ICD-10-CM | POA: Diagnosis not present

## 2014-02-14 DIAGNOSIS — R5383 Other fatigue: Secondary | ICD-10-CM | POA: Diagnosis not present

## 2014-02-23 ENCOUNTER — Ambulatory Visit (INDEPENDENT_AMBULATORY_CARE_PROVIDER_SITE_OTHER): Payer: MEDICARE | Admitting: Ophthalmology

## 2014-02-23 DIAGNOSIS — I10 Essential (primary) hypertension: Secondary | ICD-10-CM | POA: Diagnosis not present

## 2014-02-23 DIAGNOSIS — H35379 Puckering of macula, unspecified eye: Secondary | ICD-10-CM | POA: Diagnosis not present

## 2014-02-23 DIAGNOSIS — H35039 Hypertensive retinopathy, unspecified eye: Secondary | ICD-10-CM | POA: Diagnosis not present

## 2014-02-23 DIAGNOSIS — H43819 Vitreous degeneration, unspecified eye: Secondary | ICD-10-CM

## 2014-02-23 DIAGNOSIS — H3530 Unspecified macular degeneration: Secondary | ICD-10-CM | POA: Diagnosis not present

## 2014-02-24 DIAGNOSIS — R809 Proteinuria, unspecified: Secondary | ICD-10-CM | POA: Diagnosis not present

## 2014-02-24 DIAGNOSIS — N189 Chronic kidney disease, unspecified: Secondary | ICD-10-CM | POA: Diagnosis not present

## 2014-02-24 DIAGNOSIS — Z79899 Other long term (current) drug therapy: Secondary | ICD-10-CM | POA: Diagnosis not present

## 2014-02-24 DIAGNOSIS — E559 Vitamin D deficiency, unspecified: Secondary | ICD-10-CM | POA: Diagnosis not present

## 2014-02-24 DIAGNOSIS — D649 Anemia, unspecified: Secondary | ICD-10-CM | POA: Diagnosis not present

## 2014-03-01 DIAGNOSIS — I10 Essential (primary) hypertension: Secondary | ICD-10-CM | POA: Diagnosis not present

## 2014-03-01 DIAGNOSIS — R809 Proteinuria, unspecified: Secondary | ICD-10-CM | POA: Diagnosis not present

## 2014-03-01 DIAGNOSIS — N183 Chronic kidney disease, stage 3 unspecified: Secondary | ICD-10-CM | POA: Diagnosis not present

## 2014-03-01 DIAGNOSIS — D649 Anemia, unspecified: Secondary | ICD-10-CM | POA: Diagnosis not present

## 2014-03-03 DIAGNOSIS — F4323 Adjustment disorder with mixed anxiety and depressed mood: Secondary | ICD-10-CM | POA: Diagnosis not present

## 2014-03-14 DIAGNOSIS — F4323 Adjustment disorder with mixed anxiety and depressed mood: Secondary | ICD-10-CM | POA: Diagnosis not present

## 2014-04-12 DIAGNOSIS — F4323 Adjustment disorder with mixed anxiety and depressed mood: Secondary | ICD-10-CM | POA: Diagnosis not present

## 2014-04-19 DIAGNOSIS — F4323 Adjustment disorder with mixed anxiety and depressed mood: Secondary | ICD-10-CM | POA: Diagnosis not present

## 2014-05-30 DIAGNOSIS — M79609 Pain in unspecified limb: Secondary | ICD-10-CM | POA: Diagnosis not present

## 2014-05-30 DIAGNOSIS — M109 Gout, unspecified: Secondary | ICD-10-CM | POA: Diagnosis not present

## 2014-06-07 DIAGNOSIS — C61 Malignant neoplasm of prostate: Secondary | ICD-10-CM | POA: Diagnosis not present

## 2014-06-14 DIAGNOSIS — C61 Malignant neoplasm of prostate: Secondary | ICD-10-CM | POA: Diagnosis not present

## 2014-07-04 DIAGNOSIS — N19 Unspecified kidney failure: Secondary | ICD-10-CM | POA: Diagnosis not present

## 2014-07-04 DIAGNOSIS — R5381 Other malaise: Secondary | ICD-10-CM | POA: Diagnosis not present

## 2014-07-04 DIAGNOSIS — E785 Hyperlipidemia, unspecified: Secondary | ICD-10-CM | POA: Diagnosis not present

## 2014-07-04 DIAGNOSIS — F3289 Other specified depressive episodes: Secondary | ICD-10-CM | POA: Diagnosis not present

## 2014-07-04 DIAGNOSIS — F329 Major depressive disorder, single episode, unspecified: Secondary | ICD-10-CM | POA: Diagnosis not present

## 2014-07-04 DIAGNOSIS — K2100 Gastro-esophageal reflux disease with esophagitis, without bleeding: Secondary | ICD-10-CM | POA: Diagnosis not present

## 2014-07-07 DIAGNOSIS — N189 Chronic kidney disease, unspecified: Secondary | ICD-10-CM | POA: Diagnosis not present

## 2014-07-07 DIAGNOSIS — R809 Proteinuria, unspecified: Secondary | ICD-10-CM | POA: Diagnosis not present

## 2014-07-07 DIAGNOSIS — D649 Anemia, unspecified: Secondary | ICD-10-CM | POA: Diagnosis not present

## 2014-07-07 DIAGNOSIS — I10 Essential (primary) hypertension: Secondary | ICD-10-CM | POA: Diagnosis not present

## 2014-07-07 DIAGNOSIS — E559 Vitamin D deficiency, unspecified: Secondary | ICD-10-CM | POA: Diagnosis not present

## 2014-07-07 DIAGNOSIS — Z79899 Other long term (current) drug therapy: Secondary | ICD-10-CM | POA: Diagnosis not present

## 2014-07-12 DIAGNOSIS — F4323 Adjustment disorder with mixed anxiety and depressed mood: Secondary | ICD-10-CM | POA: Diagnosis not present

## 2014-07-12 DIAGNOSIS — R809 Proteinuria, unspecified: Secondary | ICD-10-CM | POA: Diagnosis not present

## 2014-07-12 DIAGNOSIS — I10 Essential (primary) hypertension: Secondary | ICD-10-CM | POA: Diagnosis not present

## 2014-07-12 DIAGNOSIS — D649 Anemia, unspecified: Secondary | ICD-10-CM | POA: Diagnosis not present

## 2014-07-12 DIAGNOSIS — N183 Chronic kidney disease, stage 3 unspecified: Secondary | ICD-10-CM | POA: Diagnosis not present

## 2014-07-18 DIAGNOSIS — Z23 Encounter for immunization: Secondary | ICD-10-CM | POA: Diagnosis not present

## 2014-07-26 DIAGNOSIS — F4323 Adjustment disorder with mixed anxiety and depressed mood: Secondary | ICD-10-CM | POA: Diagnosis not present

## 2014-07-27 DIAGNOSIS — F3289 Other specified depressive episodes: Secondary | ICD-10-CM | POA: Diagnosis not present

## 2014-07-27 DIAGNOSIS — R5381 Other malaise: Secondary | ICD-10-CM | POA: Diagnosis not present

## 2014-07-27 DIAGNOSIS — F329 Major depressive disorder, single episode, unspecified: Secondary | ICD-10-CM | POA: Diagnosis not present

## 2014-07-27 DIAGNOSIS — R5383 Other fatigue: Secondary | ICD-10-CM | POA: Diagnosis not present

## 2014-07-27 DIAGNOSIS — E785 Hyperlipidemia, unspecified: Secondary | ICD-10-CM | POA: Diagnosis not present

## 2014-08-04 ENCOUNTER — Other Ambulatory Visit (HOSPITAL_COMMUNITY): Payer: Self-pay | Admitting: Cardiology

## 2014-08-04 DIAGNOSIS — I6529 Occlusion and stenosis of unspecified carotid artery: Secondary | ICD-10-CM

## 2014-08-05 ENCOUNTER — Encounter: Payer: Self-pay | Admitting: Cardiovascular Disease

## 2014-08-05 ENCOUNTER — Ambulatory Visit (INDEPENDENT_AMBULATORY_CARE_PROVIDER_SITE_OTHER): Payer: MEDICARE | Admitting: Cardiovascular Disease

## 2014-08-05 VITALS — BP 144/54 | HR 76 | Ht 68.0 in | Wt 189.0 lb

## 2014-08-05 DIAGNOSIS — I10 Essential (primary) hypertension: Secondary | ICD-10-CM

## 2014-08-05 DIAGNOSIS — I2510 Atherosclerotic heart disease of native coronary artery without angina pectoris: Secondary | ICD-10-CM

## 2014-08-05 DIAGNOSIS — I779 Disorder of arteries and arterioles, unspecified: Secondary | ICD-10-CM | POA: Diagnosis not present

## 2014-08-05 DIAGNOSIS — E785 Hyperlipidemia, unspecified: Secondary | ICD-10-CM | POA: Diagnosis not present

## 2014-08-05 DIAGNOSIS — I739 Peripheral vascular disease, unspecified: Secondary | ICD-10-CM

## 2014-08-05 NOTE — Progress Notes (Signed)
Patient ID: [name] [name], male   DOB: Feb 01, 1934, 80 y.o.   MRN: [id number]       CARDIOLOGY CONSULT NOTE  Patient ID: [name] [name] MRN: [id number] DOB/AGE: 01-22-1934 80 y.o.  Admit date: (Not on file) Primary Physician HAWKINS,EDWARD L, MD  Reason for Consultation: CAD, carotid artery disease, HTN, hyperlipidemia  HPI: The patient is a 80 year old male with a history of CAD, carotid artery disease, hypertension and hyperlipidemia who was most recently seen in this office by Dr. Verl Blalock in 07/2011. He denies chest pain, palpitations, orthopnea, PND, leg swelling and syncope. He has some dizziness and attributes this to Abilify which he takes for depression. He tries to do some walking but is felt fatigued and attributes this to his medications. He said prior to his stent placement his symptoms were palpitations. He said he had his cholesterol checked by his PCP and his results were good, although I do not have a copy of this at present. He is scheduled to undergo bilateral carotid artery Dopplers next week in [place].  ECG performed in the office today demonstrates normal sinus rhythm, heart rate 69 beats per minute, with a diffuse nonspecific ST segment abnormality.   Allergies  Allergen Reactions  . Ivp Dye [Iodinated Diagnostic Agents]     Due to partial renal failure  . Nalbuphine Other (See Comments)    PATIENT STATES IT MADE HIM FEEL LIKE HE WAS GOING TO DIE. NO SPECIFICS  . Codeine Anxiety    Current Outpatient Prescriptions  Medication Sig Dispense Refill  . acetaminophen (TYLENOL) 500 MG tablet Take 500-1,000 mg by mouth as needed. For headache pain       . ALFALFA PO Take by mouth.      Marland Kitchen amLODipine (NORVASC) 10 MG tablet Take 10 mg by mouth every morning.        . ARIPiprazole (ABILIFY) 10 MG tablet Take 10 mg by mouth daily.      Marland Kitchen aspirin EC 81 MG tablet Take 81 mg by mouth daily.        . benazepril (LOTENSIN) 40 MG tablet Take 40 mg by mouth daily.          . fish oil-omega-3 fatty acids 1000 MG capsule Take 1 g by mouth 2 (two) times daily.       . lansoprazole (PREVACID) 30 MG capsule Take 30 mg by mouth daily.        . Methylcellulose, Laxative, (CITRUCEL) 500 MG TABS Take 1 tablet by mouth daily.        . rosuvastatin (CRESTOR) 10 MG tablet Take 10 mg by mouth every morning.        No current facility-administered medications for this visit.    Past Medical History  Diagnosis Date  . Hypertension   . Incontinence   . Reflux   . Hyperlipidemia   . Carotid artery occlusion   . Cancer     prostate ca 1999/removal/ rad tx  . 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   . Renal disorder     Past Surgical History  Procedure Laterality Date  . Radical prostectomy    . Hernia repair    . Cataracts      History   Social History  . Marital Status: Widowed    Spouse Name: N/A    Number of Children: N/A  . Years of Education: N/A   Occupational History  .  retired    Social History Main Topics  . Smoking status: Former Smoker -- 2.00 packs/day    Types: Cigarettes    Start date: 08/05/1962    Quit date: 12/02/1976  . Smokeless tobacco: Never Used  . Alcohol Use: No  . Drug Use: No  . Sexual Activity: Not on file   Other Topics Concern  . Not on file   Social History Narrative  . No narrative on file     No family history of premature CAD in 1st degree relatives.  Prior to Admission medications   Medication Sig Start Date End Date Taking? Authorizing Provider  acetaminophen (TYLENOL) 500 MG tablet Take 500-1,000 mg by mouth as needed. For headache pain    Yes Historical Provider, MD  ALFALFA PO Take by mouth.   Yes Historical Provider, MD  amLODipine (NORVASC) 10 MG tablet Take 10 mg by mouth every morning.     Yes Historical Provider, MD  ARIPiprazole (ABILIFY) 10 MG tablet Take 10 mg by mouth daily.   Yes Historical Provider, MD  aspirin EC  81 MG tablet Take 81 mg by mouth daily.     Yes Historical Provider, MD  benazepril (LOTENSIN) 40 MG tablet Take 40 mg by mouth daily.     Yes Historical Provider, MD  fish oil-omega-3 fatty acids 1000 MG capsule Take 1 g by mouth 2 (two) times daily.    Yes Historical Provider, MD  lansoprazole (PREVACID) 30 MG capsule Take 30 mg by mouth daily.     Yes Historical Provider, MD  Methylcellulose, Laxative, (CITRUCEL) 500 MG TABS Take 1 tablet by mouth daily.     Yes Historical Provider, MD  rosuvastatin (CRESTOR) 10 MG tablet Take 10 mg by mouth every morning.    Yes Historical Provider, MD     Review of systems complete and found to be negative unless listed above in HPI     Physical exam Blood pressure 144/54, pulse 76, height 5\' 8"  (1.727 m), weight 189 lb (85.73 kg). General: NAD Neck: No JVD, no thyromegaly or thyroid nodule.  Lungs: Clear to auscultation bilaterally with normal respiratory effort. CV: Nondisplaced PMI. Regular rate and rhythm, normal S1/S2, no S3/S4, no murmur.  No peripheral edema.  No carotid bruit.  Normal pedal pulses.  Abdomen: Soft, nontender, no hepatosplenomegaly, no distention.  Skin: Intact without lesions or rashes.  Neurologic: Alert and oriented x 3.  Psych: Flat affect. Extremities: No clubbing or cyanosis.  HEENT: Normal.   ECG: Most recent ECG reviewed.  Labs:   Lab Results  Component Value Date   WBC 9.3 01/26/2012   HGB 12.9* 01/26/2012   HCT 38.2* 01/26/2012   MCV 90.3 01/26/2012   PLT 195 01/26/2012   No results found for this basename: NA, K, CL, CO2, BUN, CREATININE, CALCIUM, LABALBU, PROT, BILITOT, ALKPHOS, ALT, AST, GLUCOSE,  in the last 168 hours Lab Results  Component Value Date   CKTOTAL 50 12/11/2007   CKMB 1.1 12/11/2007   TROPONINI <0.30 01/26/2012    Lab Results  Component Value Date   CHOL 146 07/26/2008   CHOL 167 12/08/2007   Lab Results  Component Value Date   HDL 30.3* 07/26/2008   HDL 36.0* 12/08/2007   Lab Results   Component Value Date   LDLCALC 82 07/26/2008   LDLCALC 98 12/08/2007   Lab Results  Component Value Date   TRIG 171* 07/26/2008   TRIG 166* 12/08/2007   Lab Results  Component Value Date  CHOLHDL 4.8 CALC 07/26/2008   CHOLHDL 4.6 CALC 12/08/2007   No results found for this basename: LDLDIRECT         Studies: No results found.  ASSESSMENT AND PLAN:  1. CAD: Stable ischemic heart disease. Continue aspirin 81 mg and Crestor 10 mg. 2. Essential HTN: Mildly elevated today. Will continue to monitor. Continue amlodipine 10 mg daily and benazepril 40 mg daily. 3. Hyperlipidemia: Will obtain a copy of most recent lipid panel from PCP's office. 4. Carotid artery disease: I will followup on the results of Dopplers to be performed next week.  Dispo: f/u 1 year.  Signed: Kate Sable, M.D., F.A.C.C.  08/05/2014, 11:47 AM

## 2014-08-05 NOTE — Patient Instructions (Signed)
Your physician wants you to follow-up in: 1 year You will receive a reminder letter in the mail two months in advance. If you don't receive a letter, please call our office to schedule the follow-up appointment.    Your physician recommends that you continue on your current medications as directed. Please refer to the Current Medication list given to you today.     Thank you for choosing [facility] !  

## 2014-08-11 ENCOUNTER — Ambulatory Visit (HOSPITAL_COMMUNITY): Payer: MEDICARE | Attending: Pulmonary Disease | Admitting: Cardiology

## 2014-08-11 DIAGNOSIS — E785 Hyperlipidemia, unspecified: Secondary | ICD-10-CM | POA: Insufficient documentation

## 2014-08-11 DIAGNOSIS — I2510 Atherosclerotic heart disease of native coronary artery without angina pectoris: Secondary | ICD-10-CM | POA: Insufficient documentation

## 2014-08-11 DIAGNOSIS — I10 Essential (primary) hypertension: Secondary | ICD-10-CM | POA: Insufficient documentation

## 2014-08-11 DIAGNOSIS — I6529 Occlusion and stenosis of unspecified carotid artery: Secondary | ICD-10-CM | POA: Diagnosis not present

## 2014-08-11 DIAGNOSIS — Z87891 Personal history of nicotine dependence: Secondary | ICD-10-CM | POA: Diagnosis not present

## 2014-08-11 NOTE — Progress Notes (Signed)
Carotid duplex

## 2014-08-16 ENCOUNTER — Encounter: Payer: Self-pay | Admitting: Cardiovascular Disease

## 2014-09-01 DIAGNOSIS — N529 Male erectile dysfunction, unspecified: Secondary | ICD-10-CM | POA: Diagnosis not present

## 2014-09-15 DIAGNOSIS — N529 Male erectile dysfunction, unspecified: Secondary | ICD-10-CM | POA: Diagnosis not present

## 2014-09-29 DIAGNOSIS — N529 Male erectile dysfunction, unspecified: Secondary | ICD-10-CM | POA: Diagnosis not present

## 2014-10-13 DIAGNOSIS — N529 Male erectile dysfunction, unspecified: Secondary | ICD-10-CM | POA: Diagnosis not present

## 2014-10-17 DIAGNOSIS — F332 Major depressive disorder, recurrent severe without psychotic features: Secondary | ICD-10-CM | POA: Diagnosis not present

## 2014-10-18 DIAGNOSIS — F332 Major depressive disorder, recurrent severe without psychotic features: Secondary | ICD-10-CM | POA: Diagnosis not present

## 2014-10-31 DIAGNOSIS — I10 Essential (primary) hypertension: Secondary | ICD-10-CM | POA: Diagnosis not present

## 2014-10-31 DIAGNOSIS — R609 Edema, unspecified: Secondary | ICD-10-CM | POA: Diagnosis not present

## 2014-11-17 DIAGNOSIS — E559 Vitamin D deficiency, unspecified: Secondary | ICD-10-CM | POA: Diagnosis not present

## 2014-11-17 DIAGNOSIS — Z79899 Other long term (current) drug therapy: Secondary | ICD-10-CM | POA: Diagnosis not present

## 2014-11-17 DIAGNOSIS — D649 Anemia, unspecified: Secondary | ICD-10-CM | POA: Diagnosis not present

## 2014-11-17 DIAGNOSIS — I10 Essential (primary) hypertension: Secondary | ICD-10-CM | POA: Diagnosis not present

## 2014-11-17 DIAGNOSIS — R809 Proteinuria, unspecified: Secondary | ICD-10-CM | POA: Diagnosis not present

## 2014-11-17 DIAGNOSIS — N183 Chronic kidney disease, stage 3 (moderate): Secondary | ICD-10-CM | POA: Diagnosis not present

## 2014-11-22 DIAGNOSIS — I10 Essential (primary) hypertension: Secondary | ICD-10-CM | POA: Diagnosis not present

## 2014-11-22 DIAGNOSIS — D631 Anemia in chronic kidney disease: Secondary | ICD-10-CM | POA: Diagnosis not present

## 2014-11-22 DIAGNOSIS — R809 Proteinuria, unspecified: Secondary | ICD-10-CM | POA: Diagnosis not present

## 2014-11-22 DIAGNOSIS — N184 Chronic kidney disease, stage 4 (severe): Secondary | ICD-10-CM | POA: Diagnosis not present

## 2014-12-05 ENCOUNTER — Ambulatory Visit (INDEPENDENT_AMBULATORY_CARE_PROVIDER_SITE_OTHER): Payer: MEDICARE | Admitting: Ophthalmology

## 2014-12-05 DIAGNOSIS — H35371 Puckering of macula, right eye: Secondary | ICD-10-CM

## 2014-12-05 DIAGNOSIS — H4423 Degenerative myopia, bilateral: Secondary | ICD-10-CM

## 2014-12-05 DIAGNOSIS — I10 Essential (primary) hypertension: Secondary | ICD-10-CM | POA: Diagnosis not present

## 2014-12-05 DIAGNOSIS — H35033 Hypertensive retinopathy, bilateral: Secondary | ICD-10-CM

## 2014-12-05 DIAGNOSIS — H43813 Vitreous degeneration, bilateral: Secondary | ICD-10-CM

## 2014-12-07 DIAGNOSIS — I2510 Atherosclerotic heart disease of native coronary artery without angina pectoris: Secondary | ICD-10-CM | POA: Diagnosis not present

## 2014-12-07 DIAGNOSIS — I130 Hypertensive heart and chronic kidney disease with heart failure and stage 1 through stage 4 chronic kidney disease, or unspecified chronic kidney disease: Secondary | ICD-10-CM | POA: Diagnosis not present

## 2014-12-07 DIAGNOSIS — F329 Major depressive disorder, single episode, unspecified: Secondary | ICD-10-CM | POA: Diagnosis not present

## 2014-12-07 DIAGNOSIS — I10 Essential (primary) hypertension: Secondary | ICD-10-CM | POA: Diagnosis not present

## 2014-12-27 DIAGNOSIS — H40053 Ocular hypertension, bilateral: Secondary | ICD-10-CM | POA: Diagnosis not present

## 2015-02-14 DIAGNOSIS — F332 Major depressive disorder, recurrent severe without psychotic features: Secondary | ICD-10-CM | POA: Diagnosis not present

## 2015-02-16 ENCOUNTER — Other Ambulatory Visit (HOSPITAL_COMMUNITY): Payer: Self-pay | Admitting: Cardiology

## 2015-02-16 DIAGNOSIS — I6523 Occlusion and stenosis of bilateral carotid arteries: Secondary | ICD-10-CM

## 2015-02-20 ENCOUNTER — Ambulatory Visit (HOSPITAL_COMMUNITY): Payer: Medicare Other | Attending: Pulmonary Disease | Admitting: Cardiology

## 2015-02-20 DIAGNOSIS — I10 Essential (primary) hypertension: Secondary | ICD-10-CM | POA: Diagnosis not present

## 2015-02-20 DIAGNOSIS — Z87891 Personal history of nicotine dependence: Secondary | ICD-10-CM | POA: Diagnosis not present

## 2015-02-20 DIAGNOSIS — I6523 Occlusion and stenosis of bilateral carotid arteries: Secondary | ICD-10-CM

## 2015-02-20 DIAGNOSIS — I2510 Atherosclerotic heart disease of native coronary artery without angina pectoris: Secondary | ICD-10-CM | POA: Insufficient documentation

## 2015-02-20 DIAGNOSIS — E785 Hyperlipidemia, unspecified: Secondary | ICD-10-CM | POA: Diagnosis not present

## 2015-02-20 NOTE — Progress Notes (Signed)
Carotid duplex performed 

## 2015-02-22 DIAGNOSIS — R809 Proteinuria, unspecified: Secondary | ICD-10-CM | POA: Diagnosis not present

## 2015-02-22 DIAGNOSIS — Z79899 Other long term (current) drug therapy: Secondary | ICD-10-CM | POA: Diagnosis not present

## 2015-02-22 DIAGNOSIS — I10 Essential (primary) hypertension: Secondary | ICD-10-CM | POA: Diagnosis not present

## 2015-02-22 DIAGNOSIS — D649 Anemia, unspecified: Secondary | ICD-10-CM | POA: Diagnosis not present

## 2015-02-22 DIAGNOSIS — N183 Chronic kidney disease, stage 3 (moderate): Secondary | ICD-10-CM | POA: Diagnosis not present

## 2015-02-22 DIAGNOSIS — E559 Vitamin D deficiency, unspecified: Secondary | ICD-10-CM | POA: Diagnosis not present

## 2015-03-01 DIAGNOSIS — D649 Anemia, unspecified: Secondary | ICD-10-CM | POA: Diagnosis not present

## 2015-03-01 DIAGNOSIS — R809 Proteinuria, unspecified: Secondary | ICD-10-CM | POA: Diagnosis not present

## 2015-03-01 DIAGNOSIS — N184 Chronic kidney disease, stage 4 (severe): Secondary | ICD-10-CM | POA: Diagnosis not present

## 2015-03-01 DIAGNOSIS — I10 Essential (primary) hypertension: Secondary | ICD-10-CM | POA: Diagnosis not present

## 2015-03-08 DIAGNOSIS — I2510 Atherosclerotic heart disease of native coronary artery without angina pectoris: Secondary | ICD-10-CM | POA: Diagnosis not present

## 2015-03-08 DIAGNOSIS — I10 Essential (primary) hypertension: Secondary | ICD-10-CM | POA: Diagnosis not present

## 2015-03-08 DIAGNOSIS — I130 Hypertensive heart and chronic kidney disease with heart failure and stage 1 through stage 4 chronic kidney disease, or unspecified chronic kidney disease: Secondary | ICD-10-CM | POA: Diagnosis not present

## 2015-03-08 DIAGNOSIS — F329 Major depressive disorder, single episode, unspecified: Secondary | ICD-10-CM | POA: Diagnosis not present

## 2015-06-07 DIAGNOSIS — D519 Vitamin B12 deficiency anemia, unspecified: Secondary | ICD-10-CM | POA: Diagnosis not present

## 2015-06-07 DIAGNOSIS — R809 Proteinuria, unspecified: Secondary | ICD-10-CM | POA: Diagnosis not present

## 2015-06-07 DIAGNOSIS — E559 Vitamin D deficiency, unspecified: Secondary | ICD-10-CM | POA: Diagnosis not present

## 2015-06-07 DIAGNOSIS — I10 Essential (primary) hypertension: Secondary | ICD-10-CM | POA: Diagnosis not present

## 2015-06-07 DIAGNOSIS — Z79899 Other long term (current) drug therapy: Secondary | ICD-10-CM | POA: Diagnosis not present

## 2015-06-07 DIAGNOSIS — N183 Chronic kidney disease, stage 3 (moderate): Secondary | ICD-10-CM | POA: Diagnosis not present

## 2015-06-07 DIAGNOSIS — D509 Iron deficiency anemia, unspecified: Secondary | ICD-10-CM | POA: Diagnosis not present

## 2015-06-15 DIAGNOSIS — I10 Essential (primary) hypertension: Secondary | ICD-10-CM | POA: Diagnosis not present

## 2015-06-15 DIAGNOSIS — D649 Anemia, unspecified: Secondary | ICD-10-CM | POA: Diagnosis not present

## 2015-06-15 DIAGNOSIS — R809 Proteinuria, unspecified: Secondary | ICD-10-CM | POA: Diagnosis not present

## 2015-06-15 DIAGNOSIS — N184 Chronic kidney disease, stage 4 (severe): Secondary | ICD-10-CM | POA: Diagnosis not present

## 2015-06-21 DIAGNOSIS — F419 Anxiety disorder, unspecified: Secondary | ICD-10-CM | POA: Diagnosis not present

## 2015-06-21 DIAGNOSIS — I10 Essential (primary) hypertension: Secondary | ICD-10-CM | POA: Diagnosis not present

## 2015-06-21 DIAGNOSIS — I2510 Atherosclerotic heart disease of native coronary artery without angina pectoris: Secondary | ICD-10-CM | POA: Diagnosis not present

## 2015-06-21 DIAGNOSIS — I130 Hypertensive heart and chronic kidney disease with heart failure and stage 1 through stage 4 chronic kidney disease, or unspecified chronic kidney disease: Secondary | ICD-10-CM | POA: Diagnosis not present

## 2015-06-21 DIAGNOSIS — E538 Deficiency of other specified B group vitamins: Secondary | ICD-10-CM | POA: Diagnosis not present

## 2015-06-28 DIAGNOSIS — E538 Deficiency of other specified B group vitamins: Secondary | ICD-10-CM | POA: Diagnosis not present

## 2015-07-05 DIAGNOSIS — E538 Deficiency of other specified B group vitamins: Secondary | ICD-10-CM | POA: Diagnosis not present

## 2015-07-10 DIAGNOSIS — Z23 Encounter for immunization: Secondary | ICD-10-CM | POA: Diagnosis not present

## 2015-07-12 DIAGNOSIS — E538 Deficiency of other specified B group vitamins: Secondary | ICD-10-CM | POA: Diagnosis not present

## 2015-08-02 DIAGNOSIS — E538 Deficiency of other specified B group vitamins: Secondary | ICD-10-CM | POA: Diagnosis not present

## 2015-08-15 DIAGNOSIS — F332 Major depressive disorder, recurrent severe without psychotic features: Secondary | ICD-10-CM | POA: Diagnosis not present

## 2015-08-21 ENCOUNTER — Other Ambulatory Visit (HOSPITAL_COMMUNITY): Payer: Self-pay | Admitting: Pulmonary Disease

## 2015-08-21 DIAGNOSIS — I6523 Occlusion and stenosis of bilateral carotid arteries: Secondary | ICD-10-CM

## 2015-08-24 ENCOUNTER — Ambulatory Visit (HOSPITAL_COMMUNITY)
Admission: RE | Admit: 2015-08-24 | Discharge: 2015-08-24 | Disposition: A | Discharge status: Home / Self Care | Payer: MEDICARE | Source: Ambulatory Visit | Attending: Cardiology | Admitting: Cardiology

## 2015-08-24 DIAGNOSIS — I6523 Occlusion and stenosis of bilateral carotid arteries: Secondary | ICD-10-CM | POA: Insufficient documentation

## 2015-08-24 DIAGNOSIS — I10 Essential (primary) hypertension: Secondary | ICD-10-CM | POA: Insufficient documentation

## 2015-08-24 DIAGNOSIS — E785 Hyperlipidemia, unspecified: Secondary | ICD-10-CM | POA: Diagnosis not present

## 2015-09-21 DIAGNOSIS — F419 Anxiety disorder, unspecified: Secondary | ICD-10-CM | POA: Diagnosis not present

## 2015-09-21 DIAGNOSIS — I2510 Atherosclerotic heart disease of native coronary artery without angina pectoris: Secondary | ICD-10-CM | POA: Diagnosis not present

## 2015-09-21 DIAGNOSIS — I10 Essential (primary) hypertension: Secondary | ICD-10-CM | POA: Diagnosis not present

## 2015-09-21 DIAGNOSIS — K210 Gastro-esophageal reflux disease with esophagitis: Secondary | ICD-10-CM | POA: Diagnosis not present

## 2015-10-16 DIAGNOSIS — Z79899 Other long term (current) drug therapy: Secondary | ICD-10-CM | POA: Diagnosis not present

## 2015-10-16 DIAGNOSIS — D519 Vitamin B12 deficiency anemia, unspecified: Secondary | ICD-10-CM | POA: Diagnosis not present

## 2015-10-16 DIAGNOSIS — N183 Chronic kidney disease, stage 3 (moderate): Secondary | ICD-10-CM | POA: Diagnosis not present

## 2015-10-16 DIAGNOSIS — E559 Vitamin D deficiency, unspecified: Secondary | ICD-10-CM | POA: Diagnosis not present

## 2015-10-24 DIAGNOSIS — N184 Chronic kidney disease, stage 4 (severe): Secondary | ICD-10-CM | POA: Diagnosis not present

## 2015-10-24 DIAGNOSIS — R809 Proteinuria, unspecified: Secondary | ICD-10-CM | POA: Diagnosis not present

## 2015-10-24 DIAGNOSIS — I10 Essential (primary) hypertension: Secondary | ICD-10-CM | POA: Diagnosis not present

## 2015-10-24 DIAGNOSIS — D649 Anemia, unspecified: Secondary | ICD-10-CM | POA: Diagnosis not present

## 2015-12-18 ENCOUNTER — Ambulatory Visit (INDEPENDENT_AMBULATORY_CARE_PROVIDER_SITE_OTHER): Payer: MEDICARE | Admitting: Ophthalmology

## 2015-12-18 DIAGNOSIS — H43813 Vitreous degeneration, bilateral: Secondary | ICD-10-CM

## 2015-12-18 DIAGNOSIS — H35372 Puckering of macula, left eye: Secondary | ICD-10-CM

## 2015-12-18 DIAGNOSIS — H35033 Hypertensive retinopathy, bilateral: Secondary | ICD-10-CM

## 2015-12-18 DIAGNOSIS — H4423 Degenerative myopia, bilateral: Secondary | ICD-10-CM | POA: Diagnosis not present

## 2015-12-18 DIAGNOSIS — I10 Essential (primary) hypertension: Secondary | ICD-10-CM | POA: Diagnosis not present

## 2016-02-19 DIAGNOSIS — R5383 Other fatigue: Secondary | ICD-10-CM | POA: Diagnosis not present

## 2016-02-19 DIAGNOSIS — K219 Gastro-esophageal reflux disease without esophagitis: Secondary | ICD-10-CM | POA: Diagnosis not present

## 2016-02-19 DIAGNOSIS — I10 Essential (primary) hypertension: Secondary | ICD-10-CM | POA: Diagnosis not present

## 2016-02-19 DIAGNOSIS — I2510 Atherosclerotic heart disease of native coronary artery without angina pectoris: Secondary | ICD-10-CM | POA: Diagnosis not present

## 2016-02-20 DIAGNOSIS — I10 Essential (primary) hypertension: Secondary | ICD-10-CM | POA: Diagnosis not present

## 2016-02-20 DIAGNOSIS — R972 Elevated prostate specific antigen [PSA]: Secondary | ICD-10-CM | POA: Diagnosis not present

## 2016-02-20 DIAGNOSIS — Z79899 Other long term (current) drug therapy: Secondary | ICD-10-CM | POA: Diagnosis not present

## 2016-02-20 DIAGNOSIS — D509 Iron deficiency anemia, unspecified: Secondary | ICD-10-CM | POA: Diagnosis not present

## 2016-02-20 DIAGNOSIS — N183 Chronic kidney disease, stage 3 (moderate): Secondary | ICD-10-CM | POA: Diagnosis not present

## 2016-02-20 DIAGNOSIS — E559 Vitamin D deficiency, unspecified: Secondary | ICD-10-CM | POA: Diagnosis not present

## 2016-02-20 DIAGNOSIS — R809 Proteinuria, unspecified: Secondary | ICD-10-CM | POA: Diagnosis not present

## 2016-02-27 DIAGNOSIS — F329 Major depressive disorder, single episode, unspecified: Secondary | ICD-10-CM | POA: Diagnosis not present

## 2016-02-27 DIAGNOSIS — D649 Anemia, unspecified: Secondary | ICD-10-CM | POA: Diagnosis not present

## 2016-02-27 DIAGNOSIS — N184 Chronic kidney disease, stage 4 (severe): Secondary | ICD-10-CM | POA: Diagnosis not present

## 2016-02-27 DIAGNOSIS — I10 Essential (primary) hypertension: Secondary | ICD-10-CM | POA: Diagnosis not present

## 2016-06-03 DIAGNOSIS — I10 Essential (primary) hypertension: Secondary | ICD-10-CM | POA: Diagnosis not present

## 2016-06-03 DIAGNOSIS — Z79899 Other long term (current) drug therapy: Secondary | ICD-10-CM | POA: Diagnosis not present

## 2016-06-03 DIAGNOSIS — N184 Chronic kidney disease, stage 4 (severe): Secondary | ICD-10-CM | POA: Diagnosis not present

## 2016-06-03 DIAGNOSIS — D509 Iron deficiency anemia, unspecified: Secondary | ICD-10-CM | POA: Diagnosis not present

## 2016-06-03 DIAGNOSIS — E559 Vitamin D deficiency, unspecified: Secondary | ICD-10-CM | POA: Diagnosis not present

## 2016-06-03 DIAGNOSIS — N183 Chronic kidney disease, stage 3 (moderate): Secondary | ICD-10-CM | POA: Diagnosis not present

## 2016-06-11 DIAGNOSIS — F329 Major depressive disorder, single episode, unspecified: Secondary | ICD-10-CM | POA: Diagnosis not present

## 2016-06-11 DIAGNOSIS — R809 Proteinuria, unspecified: Secondary | ICD-10-CM | POA: Diagnosis not present

## 2016-06-11 DIAGNOSIS — N184 Chronic kidney disease, stage 4 (severe): Secondary | ICD-10-CM | POA: Diagnosis not present

## 2016-06-11 DIAGNOSIS — D649 Anemia, unspecified: Secondary | ICD-10-CM | POA: Diagnosis not present

## 2016-06-11 DIAGNOSIS — I10 Essential (primary) hypertension: Secondary | ICD-10-CM | POA: Diagnosis not present

## 2016-06-20 DIAGNOSIS — I2510 Atherosclerotic heart disease of native coronary artery without angina pectoris: Secondary | ICD-10-CM | POA: Diagnosis not present

## 2016-06-20 DIAGNOSIS — I10 Essential (primary) hypertension: Secondary | ICD-10-CM | POA: Diagnosis not present

## 2016-06-20 DIAGNOSIS — F329 Major depressive disorder, single episode, unspecified: Secondary | ICD-10-CM | POA: Diagnosis not present

## 2016-06-20 DIAGNOSIS — N184 Chronic kidney disease, stage 4 (severe): Secondary | ICD-10-CM | POA: Diagnosis not present

## 2016-07-04 DIAGNOSIS — I10 Essential (primary) hypertension: Secondary | ICD-10-CM | POA: Diagnosis not present

## 2016-07-15 DIAGNOSIS — Z23 Encounter for immunization: Secondary | ICD-10-CM | POA: Diagnosis not present

## 2016-07-29 ENCOUNTER — Other Ambulatory Visit: Payer: Self-pay

## 2016-09-03 DIAGNOSIS — R809 Proteinuria, unspecified: Secondary | ICD-10-CM | POA: Diagnosis not present

## 2016-09-03 DIAGNOSIS — N183 Chronic kidney disease, stage 3 (moderate): Secondary | ICD-10-CM | POA: Diagnosis not present

## 2016-09-03 DIAGNOSIS — Z79899 Other long term (current) drug therapy: Secondary | ICD-10-CM | POA: Diagnosis not present

## 2016-09-03 DIAGNOSIS — I10 Essential (primary) hypertension: Secondary | ICD-10-CM | POA: Diagnosis not present

## 2016-09-03 DIAGNOSIS — E559 Vitamin D deficiency, unspecified: Secondary | ICD-10-CM | POA: Diagnosis not present

## 2016-09-03 DIAGNOSIS — D509 Iron deficiency anemia, unspecified: Secondary | ICD-10-CM | POA: Diagnosis not present

## 2016-09-10 DIAGNOSIS — I10 Essential (primary) hypertension: Secondary | ICD-10-CM | POA: Diagnosis not present

## 2016-09-10 DIAGNOSIS — R809 Proteinuria, unspecified: Secondary | ICD-10-CM | POA: Diagnosis not present

## 2016-09-10 DIAGNOSIS — D649 Anemia, unspecified: Secondary | ICD-10-CM | POA: Diagnosis not present

## 2016-09-10 DIAGNOSIS — N184 Chronic kidney disease, stage 4 (severe): Secondary | ICD-10-CM | POA: Diagnosis not present

## 2016-12-17 DIAGNOSIS — N183 Chronic kidney disease, stage 3 (moderate): Secondary | ICD-10-CM | POA: Diagnosis not present

## 2016-12-17 DIAGNOSIS — D509 Iron deficiency anemia, unspecified: Secondary | ICD-10-CM | POA: Diagnosis not present

## 2016-12-17 DIAGNOSIS — E559 Vitamin D deficiency, unspecified: Secondary | ICD-10-CM | POA: Diagnosis not present

## 2016-12-17 DIAGNOSIS — I10 Essential (primary) hypertension: Secondary | ICD-10-CM | POA: Diagnosis not present

## 2016-12-17 DIAGNOSIS — R809 Proteinuria, unspecified: Secondary | ICD-10-CM | POA: Diagnosis not present

## 2016-12-17 DIAGNOSIS — Z79899 Other long term (current) drug therapy: Secondary | ICD-10-CM | POA: Diagnosis not present

## 2016-12-24 DIAGNOSIS — D649 Anemia, unspecified: Secondary | ICD-10-CM | POA: Diagnosis not present

## 2016-12-24 DIAGNOSIS — R809 Proteinuria, unspecified: Secondary | ICD-10-CM | POA: Diagnosis not present

## 2016-12-24 DIAGNOSIS — N184 Chronic kidney disease, stage 4 (severe): Secondary | ICD-10-CM | POA: Diagnosis not present

## 2016-12-24 DIAGNOSIS — I10 Essential (primary) hypertension: Secondary | ICD-10-CM | POA: Diagnosis not present

## 2016-12-25 ENCOUNTER — Ambulatory Visit (INDEPENDENT_AMBULATORY_CARE_PROVIDER_SITE_OTHER): Payer: Self-pay | Admitting: Ophthalmology

## 2017-01-23 DIAGNOSIS — I10 Essential (primary) hypertension: Secondary | ICD-10-CM | POA: Diagnosis not present

## 2017-01-23 DIAGNOSIS — I2510 Atherosclerotic heart disease of native coronary artery without angina pectoris: Secondary | ICD-10-CM | POA: Diagnosis not present

## 2017-01-23 DIAGNOSIS — I130 Hypertensive heart and chronic kidney disease with heart failure and stage 1 through stage 4 chronic kidney disease, or unspecified chronic kidney disease: Secondary | ICD-10-CM | POA: Diagnosis not present

## 2017-01-23 DIAGNOSIS — N184 Chronic kidney disease, stage 4 (severe): Secondary | ICD-10-CM | POA: Diagnosis not present

## 2017-01-29 ENCOUNTER — Ambulatory Visit (INDEPENDENT_AMBULATORY_CARE_PROVIDER_SITE_OTHER): Payer: MEDICARE | Admitting: Ophthalmology

## 2017-01-29 DIAGNOSIS — I10 Essential (primary) hypertension: Secondary | ICD-10-CM | POA: Diagnosis not present

## 2017-01-29 DIAGNOSIS — H4423 Degenerative myopia, bilateral: Secondary | ICD-10-CM

## 2017-01-29 DIAGNOSIS — H43813 Vitreous degeneration, bilateral: Secondary | ICD-10-CM | POA: Diagnosis not present

## 2017-01-29 DIAGNOSIS — H35033 Hypertensive retinopathy, bilateral: Secondary | ICD-10-CM | POA: Diagnosis not present

## 2017-02-05 DIAGNOSIS — H35033 Hypertensive retinopathy, bilateral: Secondary | ICD-10-CM | POA: Diagnosis not present

## 2017-04-22 DIAGNOSIS — I10 Essential (primary) hypertension: Secondary | ICD-10-CM | POA: Diagnosis not present

## 2017-04-22 DIAGNOSIS — Z79899 Other long term (current) drug therapy: Secondary | ICD-10-CM | POA: Diagnosis not present

## 2017-04-22 DIAGNOSIS — N183 Chronic kidney disease, stage 3 (moderate): Secondary | ICD-10-CM | POA: Diagnosis not present

## 2017-04-22 DIAGNOSIS — Z125 Encounter for screening for malignant neoplasm of prostate: Secondary | ICD-10-CM | POA: Diagnosis not present

## 2017-04-22 DIAGNOSIS — D509 Iron deficiency anemia, unspecified: Secondary | ICD-10-CM | POA: Diagnosis not present

## 2017-04-22 DIAGNOSIS — R809 Proteinuria, unspecified: Secondary | ICD-10-CM | POA: Diagnosis not present

## 2017-04-22 DIAGNOSIS — E559 Vitamin D deficiency, unspecified: Secondary | ICD-10-CM | POA: Diagnosis not present

## 2017-04-29 DIAGNOSIS — N184 Chronic kidney disease, stage 4 (severe): Secondary | ICD-10-CM | POA: Diagnosis not present

## 2017-04-29 DIAGNOSIS — R809 Proteinuria, unspecified: Secondary | ICD-10-CM | POA: Diagnosis not present

## 2017-04-29 DIAGNOSIS — N250 Renal osteodystrophy: Secondary | ICD-10-CM | POA: Diagnosis not present

## 2017-04-29 DIAGNOSIS — I10 Essential (primary) hypertension: Secondary | ICD-10-CM | POA: Diagnosis not present

## 2017-05-26 DIAGNOSIS — I10 Essential (primary) hypertension: Secondary | ICD-10-CM | POA: Diagnosis not present

## 2017-05-26 DIAGNOSIS — R609 Edema, unspecified: Secondary | ICD-10-CM | POA: Diagnosis not present

## 2017-05-26 DIAGNOSIS — N184 Chronic kidney disease, stage 4 (severe): Secondary | ICD-10-CM | POA: Diagnosis not present

## 2017-05-26 DIAGNOSIS — I2510 Atherosclerotic heart disease of native coronary artery without angina pectoris: Secondary | ICD-10-CM | POA: Diagnosis not present

## 2017-05-27 DIAGNOSIS — I2510 Atherosclerotic heart disease of native coronary artery without angina pectoris: Secondary | ICD-10-CM | POA: Diagnosis not present

## 2017-05-27 DIAGNOSIS — I10 Essential (primary) hypertension: Secondary | ICD-10-CM | POA: Diagnosis not present

## 2017-05-27 DIAGNOSIS — R609 Edema, unspecified: Secondary | ICD-10-CM | POA: Diagnosis not present

## 2017-05-27 DIAGNOSIS — N184 Chronic kidney disease, stage 4 (severe): Secondary | ICD-10-CM | POA: Diagnosis not present

## 2017-06-02 DIAGNOSIS — J209 Acute bronchitis, unspecified: Secondary | ICD-10-CM | POA: Diagnosis not present

## 2017-06-02 DIAGNOSIS — I10 Essential (primary) hypertension: Secondary | ICD-10-CM | POA: Diagnosis not present

## 2017-06-02 DIAGNOSIS — N184 Chronic kidney disease, stage 4 (severe): Secondary | ICD-10-CM | POA: Diagnosis not present

## 2017-06-02 DIAGNOSIS — D649 Anemia, unspecified: Secondary | ICD-10-CM | POA: Diagnosis not present

## 2017-06-10 DIAGNOSIS — X32XXXA Exposure to sunlight, initial encounter: Secondary | ICD-10-CM | POA: Diagnosis not present

## 2017-06-10 DIAGNOSIS — D225 Melanocytic nevi of trunk: Secondary | ICD-10-CM | POA: Diagnosis not present

## 2017-06-10 DIAGNOSIS — L570 Actinic keratosis: Secondary | ICD-10-CM | POA: Diagnosis not present

## 2017-06-10 DIAGNOSIS — C44311 Basal cell carcinoma of skin of nose: Secondary | ICD-10-CM | POA: Diagnosis not present

## 2017-06-11 ENCOUNTER — Ambulatory Visit (HOSPITAL_COMMUNITY)
Admission: RE | Admit: 2017-06-11 | Discharge: 2017-06-11 | Disposition: A | Discharge status: Home / Self Care | Payer: MEDICARE | Source: Ambulatory Visit | Attending: Pulmonary Disease | Admitting: Pulmonary Disease

## 2017-06-11 ENCOUNTER — Other Ambulatory Visit (HOSPITAL_COMMUNITY): Payer: Self-pay | Admitting: Pulmonary Disease

## 2017-06-11 DIAGNOSIS — N184 Chronic kidney disease, stage 4 (severe): Secondary | ICD-10-CM | POA: Diagnosis not present

## 2017-06-11 DIAGNOSIS — R0602 Shortness of breath: Secondary | ICD-10-CM | POA: Diagnosis not present

## 2017-06-11 DIAGNOSIS — J209 Acute bronchitis, unspecified: Secondary | ICD-10-CM | POA: Diagnosis not present

## 2017-06-11 DIAGNOSIS — R918 Other nonspecific abnormal finding of lung field: Secondary | ICD-10-CM | POA: Diagnosis not present

## 2017-06-11 DIAGNOSIS — F329 Major depressive disorder, single episode, unspecified: Secondary | ICD-10-CM | POA: Diagnosis not present

## 2017-06-11 DIAGNOSIS — I10 Essential (primary) hypertension: Secondary | ICD-10-CM | POA: Diagnosis not present

## 2017-07-16 DIAGNOSIS — Z85828 Personal history of other malignant neoplasm of skin: Secondary | ICD-10-CM | POA: Diagnosis not present

## 2017-07-16 DIAGNOSIS — Z08 Encounter for follow-up examination after completed treatment for malignant neoplasm: Secondary | ICD-10-CM | POA: Diagnosis not present

## 2017-07-16 DIAGNOSIS — D2339 Other benign neoplasm of skin of other parts of face: Secondary | ICD-10-CM | POA: Diagnosis not present

## 2017-07-24 DIAGNOSIS — Z23 Encounter for immunization: Secondary | ICD-10-CM | POA: Diagnosis not present

## 2017-08-19 DIAGNOSIS — N183 Chronic kidney disease, stage 3 (moderate): Secondary | ICD-10-CM | POA: Diagnosis not present

## 2017-08-19 DIAGNOSIS — Z79899 Other long term (current) drug therapy: Secondary | ICD-10-CM | POA: Diagnosis not present

## 2017-08-19 DIAGNOSIS — I10 Essential (primary) hypertension: Secondary | ICD-10-CM | POA: Diagnosis not present

## 2017-08-19 DIAGNOSIS — R809 Proteinuria, unspecified: Secondary | ICD-10-CM | POA: Diagnosis not present

## 2017-08-19 DIAGNOSIS — D509 Iron deficiency anemia, unspecified: Secondary | ICD-10-CM | POA: Diagnosis not present

## 2017-08-19 DIAGNOSIS — E559 Vitamin D deficiency, unspecified: Secondary | ICD-10-CM | POA: Diagnosis not present

## 2017-08-26 DIAGNOSIS — R809 Proteinuria, unspecified: Secondary | ICD-10-CM | POA: Diagnosis not present

## 2017-08-26 DIAGNOSIS — D649 Anemia, unspecified: Secondary | ICD-10-CM | POA: Diagnosis not present

## 2017-08-26 DIAGNOSIS — N184 Chronic kidney disease, stage 4 (severe): Secondary | ICD-10-CM | POA: Diagnosis not present

## 2017-08-26 DIAGNOSIS — I10 Essential (primary) hypertension: Secondary | ICD-10-CM | POA: Diagnosis not present

## 2017-09-29 DIAGNOSIS — N184 Chronic kidney disease, stage 4 (severe): Secondary | ICD-10-CM | POA: Diagnosis not present

## 2017-09-29 DIAGNOSIS — K219 Gastro-esophageal reflux disease without esophagitis: Secondary | ICD-10-CM | POA: Diagnosis not present

## 2017-09-29 DIAGNOSIS — I2510 Atherosclerotic heart disease of native coronary artery without angina pectoris: Secondary | ICD-10-CM | POA: Diagnosis not present

## 2017-09-29 DIAGNOSIS — I10 Essential (primary) hypertension: Secondary | ICD-10-CM | POA: Diagnosis not present

## 2017-10-14 ENCOUNTER — Other Ambulatory Visit (HOSPITAL_COMMUNITY): Payer: Self-pay | Admitting: Pulmonary Disease

## 2017-10-14 ENCOUNTER — Ambulatory Visit (HOSPITAL_COMMUNITY)
Admission: RE | Admit: 2017-10-14 | Discharge: 2017-10-14 | Disposition: A | Discharge status: Home / Self Care | Payer: MEDICARE | Source: Ambulatory Visit | Attending: Pulmonary Disease | Admitting: Pulmonary Disease

## 2017-10-14 DIAGNOSIS — M25561 Pain in right knee: Secondary | ICD-10-CM | POA: Diagnosis not present

## 2017-10-14 DIAGNOSIS — M79661 Pain in right lower leg: Secondary | ICD-10-CM

## 2017-10-14 DIAGNOSIS — M7989 Other specified soft tissue disorders: Secondary | ICD-10-CM

## 2017-10-14 DIAGNOSIS — R600 Localized edema: Secondary | ICD-10-CM | POA: Diagnosis not present

## 2017-10-14 DIAGNOSIS — I82441 Acute embolism and thrombosis of right tibial vein: Secondary | ICD-10-CM | POA: Diagnosis not present

## 2017-10-14 DIAGNOSIS — I10 Essential (primary) hypertension: Secondary | ICD-10-CM | POA: Diagnosis not present

## 2017-12-04 DIAGNOSIS — N184 Chronic kidney disease, stage 4 (severe): Secondary | ICD-10-CM | POA: Diagnosis not present

## 2017-12-04 DIAGNOSIS — I82401 Acute embolism and thrombosis of unspecified deep veins of right lower extremity: Secondary | ICD-10-CM | POA: Diagnosis not present

## 2017-12-04 DIAGNOSIS — I10 Essential (primary) hypertension: Secondary | ICD-10-CM | POA: Diagnosis not present

## 2017-12-04 DIAGNOSIS — I2510 Atherosclerotic heart disease of native coronary artery without angina pectoris: Secondary | ICD-10-CM | POA: Diagnosis not present

## 2017-12-23 DIAGNOSIS — E559 Vitamin D deficiency, unspecified: Secondary | ICD-10-CM | POA: Diagnosis not present

## 2017-12-23 DIAGNOSIS — N183 Chronic kidney disease, stage 3 (moderate): Secondary | ICD-10-CM | POA: Diagnosis not present

## 2017-12-23 DIAGNOSIS — Z79899 Other long term (current) drug therapy: Secondary | ICD-10-CM | POA: Diagnosis not present

## 2017-12-23 DIAGNOSIS — I10 Essential (primary) hypertension: Secondary | ICD-10-CM | POA: Diagnosis not present

## 2017-12-23 DIAGNOSIS — Z125 Encounter for screening for malignant neoplasm of prostate: Secondary | ICD-10-CM | POA: Diagnosis not present

## 2017-12-23 DIAGNOSIS — D509 Iron deficiency anemia, unspecified: Secondary | ICD-10-CM | POA: Diagnosis not present

## 2017-12-23 DIAGNOSIS — R809 Proteinuria, unspecified: Secondary | ICD-10-CM | POA: Diagnosis not present

## 2017-12-30 DIAGNOSIS — D631 Anemia in chronic kidney disease: Secondary | ICD-10-CM | POA: Diagnosis not present

## 2017-12-30 DIAGNOSIS — M9080 Osteopathy in diseases classified elsewhere, unspecified site: Secondary | ICD-10-CM | POA: Diagnosis not present

## 2017-12-30 DIAGNOSIS — I10 Essential (primary) hypertension: Secondary | ICD-10-CM | POA: Diagnosis not present

## 2017-12-30 DIAGNOSIS — E889 Metabolic disorder, unspecified: Secondary | ICD-10-CM | POA: Diagnosis not present

## 2017-12-30 DIAGNOSIS — N184 Chronic kidney disease, stage 4 (severe): Secondary | ICD-10-CM | POA: Diagnosis not present

## 2017-12-30 DIAGNOSIS — R809 Proteinuria, unspecified: Secondary | ICD-10-CM | POA: Diagnosis not present

## 2018-01-27 ENCOUNTER — Other Ambulatory Visit (HOSPITAL_COMMUNITY): Payer: Self-pay | Admitting: Pulmonary Disease

## 2018-01-27 DIAGNOSIS — I10 Essential (primary) hypertension: Secondary | ICD-10-CM | POA: Diagnosis not present

## 2018-01-27 DIAGNOSIS — N184 Chronic kidney disease, stage 4 (severe): Secondary | ICD-10-CM | POA: Diagnosis not present

## 2018-01-27 DIAGNOSIS — I82401 Acute embolism and thrombosis of unspecified deep veins of right lower extremity: Secondary | ICD-10-CM

## 2018-01-27 DIAGNOSIS — I2510 Atherosclerotic heart disease of native coronary artery without angina pectoris: Secondary | ICD-10-CM | POA: Diagnosis not present

## 2018-01-28 DIAGNOSIS — Z85828 Personal history of other malignant neoplasm of skin: Secondary | ICD-10-CM | POA: Diagnosis not present

## 2018-01-28 DIAGNOSIS — D0439 Carcinoma in situ of skin of other parts of face: Secondary | ICD-10-CM | POA: Diagnosis not present

## 2018-01-28 DIAGNOSIS — Z08 Encounter for follow-up examination after completed treatment for malignant neoplasm: Secondary | ICD-10-CM | POA: Diagnosis not present

## 2018-01-30 ENCOUNTER — Ambulatory Visit (HOSPITAL_COMMUNITY)
Admission: RE | Admit: 2018-01-30 | Discharge: 2018-01-30 | Disposition: A | Discharge status: Home / Self Care | Payer: MEDICARE | Source: Ambulatory Visit | Attending: Pulmonary Disease | Admitting: Pulmonary Disease

## 2018-01-30 DIAGNOSIS — I82401 Acute embolism and thrombosis of unspecified deep veins of right lower extremity: Secondary | ICD-10-CM | POA: Diagnosis not present

## 2018-01-30 DIAGNOSIS — C61 Malignant neoplasm of prostate: Secondary | ICD-10-CM | POA: Diagnosis not present

## 2018-02-04 ENCOUNTER — Ambulatory Visit (INDEPENDENT_AMBULATORY_CARE_PROVIDER_SITE_OTHER): Payer: MEDICARE | Admitting: Ophthalmology

## 2018-02-04 DIAGNOSIS — H4423 Degenerative myopia, bilateral: Secondary | ICD-10-CM | POA: Diagnosis not present

## 2018-02-04 DIAGNOSIS — I10 Essential (primary) hypertension: Secondary | ICD-10-CM

## 2018-02-04 DIAGNOSIS — H43813 Vitreous degeneration, bilateral: Secondary | ICD-10-CM | POA: Diagnosis not present

## 2018-02-04 DIAGNOSIS — H35033 Hypertensive retinopathy, bilateral: Secondary | ICD-10-CM | POA: Diagnosis not present

## 2018-02-09 ENCOUNTER — Ambulatory Visit (HOSPITAL_COMMUNITY)
Admission: RE | Admit: 2018-02-09 | Discharge: 2018-02-09 | Disposition: A | Discharge status: Home / Self Care | Payer: MEDICARE | Source: Ambulatory Visit | Attending: Pulmonary Disease | Admitting: Pulmonary Disease

## 2018-02-09 ENCOUNTER — Other Ambulatory Visit (HOSPITAL_COMMUNITY): Payer: Self-pay | Admitting: Pulmonary Disease

## 2018-02-09 DIAGNOSIS — R52 Pain, unspecified: Secondary | ICD-10-CM | POA: Insufficient documentation

## 2018-02-09 DIAGNOSIS — M25561 Pain in right knee: Secondary | ICD-10-CM | POA: Diagnosis not present

## 2018-02-09 DIAGNOSIS — N184 Chronic kidney disease, stage 4 (severe): Secondary | ICD-10-CM | POA: Diagnosis not present

## 2018-02-09 DIAGNOSIS — I10 Essential (primary) hypertension: Secondary | ICD-10-CM | POA: Diagnosis not present

## 2018-03-11 DIAGNOSIS — C44329 Squamous cell carcinoma of skin of other parts of face: Secondary | ICD-10-CM | POA: Diagnosis not present

## 2018-03-31 DIAGNOSIS — C44329 Squamous cell carcinoma of skin of other parts of face: Secondary | ICD-10-CM | POA: Diagnosis not present

## 2018-04-21 DIAGNOSIS — I10 Essential (primary) hypertension: Secondary | ICD-10-CM | POA: Diagnosis not present

## 2018-04-21 DIAGNOSIS — R809 Proteinuria, unspecified: Secondary | ICD-10-CM | POA: Diagnosis not present

## 2018-04-21 DIAGNOSIS — D509 Iron deficiency anemia, unspecified: Secondary | ICD-10-CM | POA: Diagnosis not present

## 2018-04-21 DIAGNOSIS — Z1159 Encounter for screening for other viral diseases: Secondary | ICD-10-CM | POA: Diagnosis not present

## 2018-04-21 DIAGNOSIS — Z79899 Other long term (current) drug therapy: Secondary | ICD-10-CM | POA: Diagnosis not present

## 2018-04-21 DIAGNOSIS — E559 Vitamin D deficiency, unspecified: Secondary | ICD-10-CM | POA: Diagnosis not present

## 2018-04-21 DIAGNOSIS — N183 Chronic kidney disease, stage 3 (moderate): Secondary | ICD-10-CM | POA: Diagnosis not present

## 2018-04-28 DIAGNOSIS — N184 Chronic kidney disease, stage 4 (severe): Secondary | ICD-10-CM | POA: Diagnosis not present

## 2018-04-28 DIAGNOSIS — D649 Anemia, unspecified: Secondary | ICD-10-CM | POA: Diagnosis not present

## 2018-04-28 DIAGNOSIS — R809 Proteinuria, unspecified: Secondary | ICD-10-CM | POA: Diagnosis not present

## 2018-04-28 DIAGNOSIS — I10 Essential (primary) hypertension: Secondary | ICD-10-CM | POA: Diagnosis not present

## 2018-04-29 ENCOUNTER — Other Ambulatory Visit (HOSPITAL_COMMUNITY): Payer: Self-pay | Admitting: Pulmonary Disease

## 2018-04-29 ENCOUNTER — Ambulatory Visit (HOSPITAL_COMMUNITY)
Admission: RE | Admit: 2018-04-29 | Discharge: 2018-04-29 | Disposition: A | Discharge status: Home / Self Care | Payer: Medicare Other | Source: Ambulatory Visit | Attending: Pulmonary Disease | Admitting: Pulmonary Disease

## 2018-04-29 DIAGNOSIS — J984 Other disorders of lung: Secondary | ICD-10-CM | POA: Insufficient documentation

## 2018-04-29 DIAGNOSIS — I10 Essential (primary) hypertension: Secondary | ICD-10-CM | POA: Diagnosis not present

## 2018-04-29 DIAGNOSIS — C4490 Unspecified malignant neoplasm of skin, unspecified: Secondary | ICD-10-CM | POA: Diagnosis not present

## 2018-04-29 DIAGNOSIS — I82401 Acute embolism and thrombosis of unspecified deep veins of right lower extremity: Secondary | ICD-10-CM | POA: Diagnosis not present

## 2018-04-29 DIAGNOSIS — I2510 Atherosclerotic heart disease of native coronary artery without angina pectoris: Secondary | ICD-10-CM | POA: Diagnosis not present

## 2018-04-29 DIAGNOSIS — I130 Hypertensive heart and chronic kidney disease with heart failure and stage 1 through stage 4 chronic kidney disease, or unspecified chronic kidney disease: Secondary | ICD-10-CM | POA: Diagnosis not present

## 2018-07-03 ENCOUNTER — Encounter (HOSPITAL_COMMUNITY): Payer: Self-pay | Admitting: Emergency Medicine

## 2018-07-03 ENCOUNTER — Emergency Department (HOSPITAL_COMMUNITY)
Admission: EM | Admit: 2018-07-03 | Discharge: 2018-07-03 | Disposition: A | Discharge status: Other Acute Inpatient Hospital | Payer: MEDICARE | Attending: Emergency Medicine | Admitting: Emergency Medicine

## 2018-07-03 ENCOUNTER — Other Ambulatory Visit: Payer: Self-pay

## 2018-07-03 DIAGNOSIS — Z79899 Other long term (current) drug therapy: Secondary | ICD-10-CM | POA: Diagnosis not present

## 2018-07-03 DIAGNOSIS — I10 Essential (primary) hypertension: Secondary | ICD-10-CM | POA: Diagnosis not present

## 2018-07-03 DIAGNOSIS — Z87891 Personal history of nicotine dependence: Secondary | ICD-10-CM | POA: Insufficient documentation

## 2018-07-03 DIAGNOSIS — R339 Retention of urine, unspecified: Secondary | ICD-10-CM | POA: Diagnosis not present

## 2018-07-03 DIAGNOSIS — R319 Hematuria, unspecified: Secondary | ICD-10-CM | POA: Diagnosis not present

## 2018-07-03 DIAGNOSIS — Z8546 Personal history of malignant neoplasm of prostate: Secondary | ICD-10-CM | POA: Diagnosis not present

## 2018-07-03 DIAGNOSIS — Z7982 Long term (current) use of aspirin: Secondary | ICD-10-CM | POA: Insufficient documentation

## 2018-07-03 DIAGNOSIS — Z87448 Personal history of other diseases of urinary system: Secondary | ICD-10-CM | POA: Diagnosis not present

## 2018-07-03 LAB — CBC WITH DIFFERENTIAL/PLATELET
Basophils Absolute: 0 10*3/uL (ref 0.0–0.1)
Basophils Relative: 0 %
Eosinophils Absolute: 0.2 10*3/uL (ref 0.0–0.7)
Eosinophils Relative: 3 %
HCT: 31.7 % — ABNORMAL LOW (ref 39.0–52.0)
Hemoglobin: 10.5 g/dL — ABNORMAL LOW (ref 13.0–17.0)
Lymphocytes Relative: 24 %
Lymphs Abs: 1.2 10*3/uL (ref 0.7–4.0)
MCH: 31.1 pg (ref 26.0–34.0)
MCHC: 33.1 g/dL (ref 30.0–36.0)
MCV: 93.8 fL (ref 78.0–100.0)
Monocytes Absolute: 0.4 10*3/uL (ref 0.1–1.0)
Monocytes Relative: 8 %
Neutro Abs: 3.3 10*3/uL (ref 1.7–7.7)
Neutrophils Relative %: 65 %
Platelets: 138 10*3/uL — ABNORMAL LOW (ref 150–400)
RBC: 3.38 MIL/uL — ABNORMAL LOW (ref 4.22–5.81)
RDW: 13.8 % (ref 11.5–15.5)
WBC: 5.1 10*3/uL (ref 4.0–10.5)

## 2018-07-03 LAB — BASIC METABOLIC PANEL
Anion gap: 9 (ref 5–15)
BUN: 35 mg/dL — ABNORMAL HIGH (ref 8–23)
CO2: 21 mmol/L — ABNORMAL LOW (ref 22–32)
Calcium: 8.9 mg/dL (ref 8.9–10.3)
Chloride: 108 mmol/L (ref 98–111)
Creatinine, Ser: 3.01 mg/dL — ABNORMAL HIGH (ref 0.61–1.24)
GFR calc Af Amer: 21 mL/min — ABNORMAL LOW (ref >60)
GFR calc non Af Amer: 18 mL/min — ABNORMAL LOW (ref >60)
Glucose, Bld: 161 mg/dL — ABNORMAL HIGH (ref 70–99)
Potassium: 3.5 mmol/L (ref 3.5–5.1)
Sodium: 138 mmol/L (ref 135–145)

## 2018-07-03 LAB — PROTIME-INR
INR: 1
Prothrombin Time: 13.1 seconds (ref 11.4–15.2)

## 2018-07-03 MED ORDER — ONDANSETRON HCL 4 MG/2ML IJ SOLN
4.0000 mg | Freq: Once | INTRAMUSCULAR | Status: AC
  Administered 2018-07-03: 4 mg via INTRAVENOUS
  Filled 2018-07-03: qty 2

## 2018-07-03 MED ORDER — MORPHINE SULFATE (PF) 4 MG/ML IV SOLN
4.0000 mg | Freq: Once | INTRAVENOUS | Status: AC
  Administered 2018-07-03: 4 mg via INTRAVENOUS
  Filled 2018-07-03: qty 1

## 2018-07-03 NOTE — ED Triage Notes (Signed)
Pt states he has not urinated since midnight and states over last week he has been having blood in his urine.

## 2018-07-03 NOTE — ED Provider Notes (Signed)
[facility] EMERGENCY DEPARTMENT Provider Note   CSN: [id number] Arrival date & time: 07/03/18  0202     History   Chief Complaint Chief Complaint  Patient presents with  . Urinary Retention    HPI [name] [name] is a 84 y.o. male.  Patient presents to the emergency department for evaluation of difficulty urinating.  Patient reports that he has been experiencing intermittent hematuria for the last couple of days.  This evening his urine turned to all blood and since midnight he has not been able to pass any urine.  Patient reports that he has an AMS 800 urinary control system in place, cannot have a Foley catheter placed.     Past Medical History:  Diagnosis Date  . 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   . Cancer ([place])    prostate ca 1999/removal/ rad tx  . Carotid artery occlusion   . Hyperlipidemia   . Hypertension   . Incontinence   . Reflux   . Renal disorder     Patient Active Problem List   Diagnosis Date Noted  . Hyperlipidemia 08/05/2014  . Knee effusion, right 04/21/2012  . Unspecified essential hypertension 07/25/2011  . CAROTID ARTERY DISEASE 06/14/2010  . CLOSED FRACTURE OF ACROMIAL END OF CLAVICLE 04/25/2010    Past Surgical History:  Procedure Laterality Date  . cataracts    . HERNIA REPAIR    . radical prostectomy    . URINARY SPHINCTER IMPLANT          Home Medications    Prior to Admission medications   Medication Sig Start Date End Date Taking? Authorizing Provider  acetaminophen (TYLENOL) 500 MG tablet Take 500-1,000 mg by mouth as needed. For headache pain     [provider]  ALFALFA PO Take by mouth.    [provider]  amLODipine (NORVASC) 10 MG tablet Take 10 mg by mouth every morning.      [provider]  ARIPiprazole (ABILIFY) 10 MG tablet Take 10 mg by mouth daily.    [provider]  aspirin EC 81 MG tablet Take 81  mg by mouth daily.      [provider]  benazepril (LOTENSIN) 40 MG tablet Take 40 mg by mouth daily.      [provider]  fish oil-omega-3 fatty acids 1000 MG capsule Take 1 g by mouth 2 (two) times daily.     [provider]  lansoprazole (PREVACID) 30 MG capsule Take 30 mg by mouth daily.      [provider]  Methylcellulose, Laxative, (CITRUCEL) 500 MG TABS Take 1 tablet by mouth daily.      [provider]  rosuvastatin (CRESTOR) 10 MG tablet Take 10 mg by mouth every morning.     [provider]    Family History Family History  Problem Relation Age of Onset  . Coronary artery disease Unknown        family history, male < 46  . Arthritis Unknown        family history  . Cancer Unknown   . Kidney disease Unknown     Social History Social History   Tobacco Use  . Smoking status: Former Smoker    Packs/day: 2.00    Types: Cigarettes    Start date: 08/05/1962    Last attempt to quit: 12/02/1976    Years since quitting: 41.6  . Smokeless  tobacco: Never Used  Substance Use Topics  . Alcohol use: No  . Drug use: No     Allergies   Ivp dye [iodinated diagnostic agents]; Nalbuphine; and Codeine   Review of Systems Review of Systems  Genitourinary: Positive for decreased urine volume and hematuria.  All other systems reviewed and are negative.    Physical Exam Updated Vital Signs Ht 5\' 8"  (1.727 m)   Wt 86.2 kg (190 lb)   BMI 28.89 kg/m   Physical Exam  Constitutional: He is oriented to person, place, and time. He appears well-developed and well-nourished. No distress.  HENT:  Head: Normocephalic and atraumatic.  Right Ear: Hearing normal.  Left Ear: Hearing normal.  Nose: Nose normal.  Mouth/Throat: Oropharynx is clear and moist and mucous membranes are normal.  Eyes: Pupils are equal, round, and reactive to light. Conjunctivae and EOM are normal.  Neck: Normal range of motion. Neck supple.   Cardiovascular: Regular rhythm, S1 normal and S2 normal. Exam reveals no gallop and no friction rub.  No murmur heard. Pulmonary/Chest: Effort normal and breath sounds normal. No respiratory distress. He exhibits no tenderness.  Abdominal: Soft. Normal appearance and bowel sounds are normal. There is no hepatosplenomegaly. There is no tenderness. There is no rebound, no guarding, no tenderness at McBurney's point and negative Murphy's sign. No hernia.  Musculoskeletal: Normal range of motion.  Neurological: He is alert and oriented to person, place, and time. He has normal strength. No cranial nerve deficit or sensory deficit. Coordination normal. GCS eye subscore is 4. GCS verbal subscore is 5. GCS motor subscore is 6.  Skin: Skin is warm, dry and intact. No rash noted. No cyanosis.  Psychiatric: He has a normal mood and affect. His speech is normal and behavior is normal. Thought content normal.  Nursing note and vitals reviewed.    ED Treatments / Results  Labs (all labs ordered are listed, but only abnormal results are displayed) Labs Reviewed  CBC WITH DIFFERENTIAL/PLATELET - Abnormal; Notable for the following components:      Result Value   RBC 3.38 (*)    Hemoglobin 10.5 (*)    HCT 31.7 (*)    Platelets 138 (*)    All other components within normal limits  BASIC METABOLIC PANEL - Abnormal; Notable for the following components:   CO2 21 (*)    Glucose, Bld 161 (*)    BUN 35 (*)    Creatinine, Ser 3.01 (*)    GFR calc non Af Amer 18 (*)    GFR calc Af Amer 21 (*)    All other components within normal limits  URINE CULTURE  PROTIME-INR  URINALYSIS, ROUTINE W REFLEX MICROSCOPIC    EKG None  Radiology No results found.  Procedures Procedures (including critical care time)  Medications Ordered in ED Medications  ondansetron (ZOFRAN) injection 4 mg (4 mg Intravenous Given 07/03/18 0415)  morphine 4 MG/ML injection 4 mg (4 mg Intravenous Given 07/03/18 0417)      Initial Impression / Assessment and Plan / ED Course  I have reviewed the triage vital signs and the nursing notes.  Pertinent labs & imaging results that were available during my care of the patient were reviewed by me and considered in my medical decision making (see chart for details).    Patient presents to the emergency department for evaluation of urinary retention.  Patient has an AMS 800 urinary control system placed at [facility] in 2012 or 2013.  He reports  that he has been noticing some blood in his urine for the last few days and then tonight urine became gross blood followed by inability to pass urine.  I did attempt to deactivate the device.  Despite multiple attempts, the bulb kept filling back up and no urine drainage occurred.  Blood work reveals normal white blood cell count.  Patient is afebrile.  He does have a creatinine of 3.  He has not had blood work since 2013 here, at which time his creatinine was 1.99.  Vital signs are stable.  He does not appear septic.  Discussed with Dr. Maury Dus, on-call for urology at [facility].  Patient has been accepted for transfer to the ER at [facility] for urology evaluation.    Final Clinical Impressions(s) / ED Diagnoses   Final diagnoses:  Urinary retention    ED Discharge Orders    None       Pollina, Gwenyth Allegra, MD 07/03/18 [phone]

## 2018-07-06 DIAGNOSIS — R310 Gross hematuria: Secondary | ICD-10-CM | POA: Diagnosis not present

## 2018-07-06 DIAGNOSIS — N184 Chronic kidney disease, stage 4 (severe): Secondary | ICD-10-CM | POA: Diagnosis not present

## 2018-07-06 DIAGNOSIS — R338 Other retention of urine: Secondary | ICD-10-CM | POA: Diagnosis not present

## 2018-07-06 DIAGNOSIS — I10 Essential (primary) hypertension: Secondary | ICD-10-CM | POA: Diagnosis not present

## 2018-07-08 ENCOUNTER — Encounter (HOSPITAL_COMMUNITY): Payer: Self-pay | Admitting: Emergency Medicine

## 2018-07-08 ENCOUNTER — Emergency Department (HOSPITAL_COMMUNITY)
Admission: EM | Admit: 2018-07-08 | Discharge: 2018-07-08 | Disposition: A | Discharge status: Other Acute Inpatient Hospital | Payer: MEDICARE | Attending: Emergency Medicine | Admitting: Emergency Medicine

## 2018-07-08 ENCOUNTER — Other Ambulatory Visit: Payer: Self-pay

## 2018-07-08 DIAGNOSIS — N390 Urinary tract infection, site not specified: Secondary | ICD-10-CM | POA: Diagnosis not present

## 2018-07-08 DIAGNOSIS — Z885 Allergy status to narcotic agent status: Secondary | ICD-10-CM | POA: Diagnosis not present

## 2018-07-08 DIAGNOSIS — R339 Retention of urine, unspecified: Secondary | ICD-10-CM | POA: Diagnosis not present

## 2018-07-08 DIAGNOSIS — R319 Hematuria, unspecified: Secondary | ICD-10-CM | POA: Diagnosis not present

## 2018-07-08 DIAGNOSIS — E872 Acidosis: Secondary | ICD-10-CM | POA: Diagnosis not present

## 2018-07-08 DIAGNOSIS — R509 Fever, unspecified: Secondary | ICD-10-CM | POA: Diagnosis not present

## 2018-07-08 DIAGNOSIS — N368 Other specified disorders of urethra: Secondary | ICD-10-CM | POA: Diagnosis not present

## 2018-07-08 DIAGNOSIS — I10 Essential (primary) hypertension: Secondary | ICD-10-CM | POA: Insufficient documentation

## 2018-07-08 DIAGNOSIS — Z8546 Personal history of malignant neoplasm of prostate: Secondary | ICD-10-CM | POA: Insufficient documentation

## 2018-07-08 DIAGNOSIS — R251 Tremor, unspecified: Secondary | ICD-10-CM | POA: Diagnosis not present

## 2018-07-08 DIAGNOSIS — N183 Chronic kidney disease, stage 3 (moderate): Secondary | ICD-10-CM | POA: Diagnosis not present

## 2018-07-08 DIAGNOSIS — R6521 Severe sepsis with septic shock: Secondary | ICD-10-CM | POA: Diagnosis not present

## 2018-07-08 DIAGNOSIS — I5030 Unspecified diastolic (congestive) heart failure: Secondary | ICD-10-CM | POA: Diagnosis present

## 2018-07-08 DIAGNOSIS — R262 Difficulty in walking, not elsewhere classified: Secondary | ICD-10-CM | POA: Diagnosis not present

## 2018-07-08 DIAGNOSIS — I2510 Atherosclerotic heart disease of native coronary artery without angina pectoris: Secondary | ICD-10-CM | POA: Diagnosis present

## 2018-07-08 DIAGNOSIS — N179 Acute kidney failure, unspecified: Secondary | ICD-10-CM | POA: Diagnosis not present

## 2018-07-08 DIAGNOSIS — Z9689 Presence of other specified functional implants: Secondary | ICD-10-CM | POA: Diagnosis not present

## 2018-07-08 DIAGNOSIS — K219 Gastro-esophageal reflux disease without esophagitis: Secondary | ICD-10-CM | POA: Diagnosis present

## 2018-07-08 DIAGNOSIS — Z66 Do not resuscitate: Secondary | ICD-10-CM | POA: Diagnosis not present

## 2018-07-08 DIAGNOSIS — Z7982 Long term (current) use of aspirin: Secondary | ICD-10-CM | POA: Insufficient documentation

## 2018-07-08 DIAGNOSIS — N184 Chronic kidney disease, stage 4 (severe): Secondary | ICD-10-CM | POA: Diagnosis not present

## 2018-07-08 DIAGNOSIS — R7881 Bacteremia: Secondary | ICD-10-CM | POA: Diagnosis not present

## 2018-07-08 DIAGNOSIS — R9431 Abnormal electrocardiogram [ECG] [EKG]: Secondary | ICD-10-CM | POA: Diagnosis not present

## 2018-07-08 DIAGNOSIS — R1030 Lower abdominal pain, unspecified: Secondary | ICD-10-CM | POA: Diagnosis not present

## 2018-07-08 DIAGNOSIS — I959 Hypotension, unspecified: Secondary | ICD-10-CM | POA: Diagnosis present

## 2018-07-08 DIAGNOSIS — Z87891 Personal history of nicotine dependence: Secondary | ICD-10-CM | POA: Insufficient documentation

## 2018-07-08 DIAGNOSIS — J90 Pleural effusion, not elsewhere classified: Secondary | ICD-10-CM | POA: Diagnosis not present

## 2018-07-08 DIAGNOSIS — G9341 Metabolic encephalopathy: Secondary | ICD-10-CM | POA: Diagnosis not present

## 2018-07-08 DIAGNOSIS — R918 Other nonspecific abnormal finding of lung field: Secondary | ICD-10-CM | POA: Diagnosis not present

## 2018-07-08 DIAGNOSIS — R6520 Severe sepsis without septic shock: Secondary | ICD-10-CM | POA: Diagnosis not present

## 2018-07-08 DIAGNOSIS — M6281 Muscle weakness (generalized): Secondary | ICD-10-CM | POA: Diagnosis not present

## 2018-07-08 DIAGNOSIS — I480 Paroxysmal atrial fibrillation: Secondary | ICD-10-CM | POA: Diagnosis not present

## 2018-07-08 DIAGNOSIS — Z79899 Other long term (current) drug therapy: Secondary | ICD-10-CM | POA: Diagnosis not present

## 2018-07-08 DIAGNOSIS — D696 Thrombocytopenia, unspecified: Secondary | ICD-10-CM | POA: Diagnosis not present

## 2018-07-08 DIAGNOSIS — A4151 Sepsis due to Escherichia coli [E. coli]: Secondary | ICD-10-CM | POA: Diagnosis not present

## 2018-07-08 DIAGNOSIS — I4891 Unspecified atrial fibrillation: Secondary | ICD-10-CM | POA: Diagnosis not present

## 2018-07-08 DIAGNOSIS — R0600 Dyspnea, unspecified: Secondary | ICD-10-CM | POA: Diagnosis not present

## 2018-07-08 DIAGNOSIS — N139 Obstructive and reflux uropathy, unspecified: Secondary | ICD-10-CM | POA: Diagnosis not present

## 2018-07-08 DIAGNOSIS — R000 Tachycardia, unspecified: Secondary | ICD-10-CM | POA: Diagnosis not present

## 2018-07-08 DIAGNOSIS — A419 Sepsis, unspecified organism: Secondary | ICD-10-CM | POA: Diagnosis not present

## 2018-07-08 DIAGNOSIS — F329 Major depressive disorder, single episode, unspecified: Secondary | ICD-10-CM | POA: Diagnosis not present

## 2018-07-08 DIAGNOSIS — N393 Stress incontinence (female) (male): Secondary | ICD-10-CM | POA: Diagnosis not present

## 2018-07-08 DIAGNOSIS — Z888 Allergy status to other drugs, medicaments and biological substances status: Secondary | ICD-10-CM | POA: Diagnosis not present

## 2018-07-08 DIAGNOSIS — E785 Hyperlipidemia, unspecified: Secondary | ICD-10-CM | POA: Diagnosis present

## 2018-07-08 DIAGNOSIS — N3091 Cystitis, unspecified with hematuria: Secondary | ICD-10-CM | POA: Diagnosis not present

## 2018-07-08 DIAGNOSIS — I130 Hypertensive heart and chronic kidney disease with heart failure and stage 1 through stage 4 chronic kidney disease, or unspecified chronic kidney disease: Secondary | ICD-10-CM | POA: Diagnosis not present

## 2018-07-08 DIAGNOSIS — Z9079 Acquired absence of other genital organ(s): Secondary | ICD-10-CM | POA: Diagnosis not present

## 2018-07-08 DIAGNOSIS — N3041 Irradiation cystitis with hematuria: Secondary | ICD-10-CM | POA: Diagnosis not present

## 2018-07-08 LAB — CBC WITH DIFFERENTIAL/PLATELET
Basophils Absolute: 0 10*3/uL (ref 0.0–0.1)
Basophils Relative: 0 %
Eosinophils Absolute: 0 10*3/uL (ref 0.0–0.7)
Eosinophils Relative: 0 %
HCT: 31.4 % — ABNORMAL LOW (ref 39.0–52.0)
Hemoglobin: 10.3 g/dL — ABNORMAL LOW (ref 13.0–17.0)
Lymphocytes Relative: 2 %
Lymphs Abs: 0.3 10*3/uL — ABNORMAL LOW (ref 0.7–4.0)
MCH: 30.7 pg (ref 26.0–34.0)
MCHC: 32.8 g/dL (ref 30.0–36.0)
MCV: 93.5 fL (ref 78.0–100.0)
Monocytes Absolute: 1.4 10*3/uL — ABNORMAL HIGH (ref 0.1–1.0)
Monocytes Relative: 8 %
Neutro Abs: 16.1 10*3/uL — ABNORMAL HIGH (ref 1.7–7.7)
Neutrophils Relative %: 90 %
Platelets: 169 10*3/uL (ref 150–400)
RBC: 3.36 MIL/uL — ABNORMAL LOW (ref 4.22–5.81)
RDW: 14 % (ref 11.5–15.5)
WBC: 17.8 10*3/uL — ABNORMAL HIGH (ref 4.0–10.5)

## 2018-07-08 LAB — BASIC METABOLIC PANEL
Anion gap: 12 (ref 5–15)
BUN: 44 mg/dL — ABNORMAL HIGH (ref 8–23)
CO2: 19 mmol/L — ABNORMAL LOW (ref 22–32)
Calcium: 9.4 mg/dL (ref 8.9–10.3)
Chloride: 111 mmol/L (ref 98–111)
Creatinine, Ser: 4.19 mg/dL — ABNORMAL HIGH (ref 0.61–1.24)
GFR calc Af Amer: 14 mL/min — ABNORMAL LOW (ref >60)
GFR calc non Af Amer: 12 mL/min — ABNORMAL LOW (ref >60)
Glucose, Bld: 130 mg/dL — ABNORMAL HIGH (ref 70–99)
Potassium: 4.2 mmol/L (ref 3.5–5.1)
Sodium: 142 mmol/L (ref 135–145)

## 2018-07-08 MED ORDER — VANCOMYCIN HCL IN DEXTROSE 1-5 GM/200ML-% IV SOLN
1.00 g | INTRAVENOUS | Status: DC
Start: 2018-07-09 — End: 2018-07-08

## 2018-07-08 MED ORDER — HYDROMORPHONE HCL 1 MG/ML IJ SOLN
INTRAMUSCULAR | Status: AC
  Filled 2018-07-08: qty 1

## 2018-07-08 MED ORDER — LACTATED RINGERS IV SOLN
250.00 | INTRAVENOUS | Status: DC
Start: 2018-07-08 — End: 2018-07-08

## 2018-07-08 MED ORDER — MUPIROCIN 2 % EX OINT
TOPICAL_OINTMENT | CUTANEOUS | Status: DC
Start: 2018-07-10 — End: 2018-07-08

## 2018-07-08 MED ORDER — GENERIC EXTERNAL MEDICATION
Status: DC
Start: ? — End: 2018-07-08

## 2018-07-08 MED ORDER — GENERIC EXTERNAL MEDICATION
1.00 g | Status: DC
Start: 2018-07-10 — End: 2018-07-08

## 2018-07-08 MED ORDER — HYDROMORPHONE HCL 1 MG/ML IJ SOLN
1.0000 mg | Freq: Once | INTRAMUSCULAR | Status: AC
Start: 2018-07-08 — End: 2018-07-08
  Administered 2018-07-08: 1 mg via INTRAVENOUS

## 2018-07-08 MED ORDER — MORPHINE SULFATE (PF) 4 MG/ML IV SOLN
4.0000 mg | Freq: Once | INTRAVENOUS | Status: AC
  Administered 2018-07-08: 4 mg via INTRAMUSCULAR
  Filled 2018-07-08: qty 1

## 2018-07-08 MED ORDER — ACETAMINOPHEN 325 MG PO TABS
650.00 | ORAL_TABLET | ORAL | Status: DC
Start: ? — End: 2018-07-08

## 2018-07-08 MED ORDER — ARIPIPRAZOLE 15 MG PO TABS
15.00 | ORAL_TABLET | ORAL | Status: DC
Start: 2018-07-18 — End: 2018-07-08

## 2018-07-08 MED ORDER — ROSUVASTATIN CALCIUM 5 MG PO TABS
10.00 | ORAL_TABLET | ORAL | Status: DC
Start: 2018-07-17 — End: 2018-07-08

## 2018-07-08 MED ORDER — GENERIC EXTERNAL MEDICATION
3.38 g | Status: DC
Start: 2018-07-09 — End: 2018-07-08

## 2018-07-08 MED ORDER — OXYCODONE HCL 5 MG PO TABS
5.00 | ORAL_TABLET | ORAL | Status: DC
Start: ? — End: 2018-07-08

## 2018-07-08 MED ORDER — PANTOPRAZOLE SODIUM 40 MG PO TBEC
40.00 | DELAYED_RELEASE_TABLET | ORAL | Status: DC
Start: 2018-07-18 — End: 2018-07-08

## 2018-07-08 NOTE — ED Triage Notes (Signed)
Pt states he has not urinated since 1400 yesterday. Pt seen here for same 07/03/18. Pt has artificial sphincter.

## 2018-07-08 NOTE — ED Provider Notes (Signed)
[facility] EMERGENCY DEPARTMENT Provider Note   CSN: [id number] Arrival date & time: 07/08/18  9024     History   Chief Complaint Chief Complaint  Patient presents with  . Urinary Retention    HPI [name] [name] is a 84 y.o. male.  Patient presents with urinary retention and inability to urinate since approximately 2 PM.  States he has had some gross blood but no urine.  He does have a artificial sphincter in place and is unable to have a Foley catheter placed.  Patient had similar problem on August 2 when he was sent to [facility] to see urology.  Patient denies any fever, chills, nausea or vomiting.  Denies testicular pain.  States he is used activating his device 7-8 times daily for the past 8 years but is not functioning at this time.   The history is provided by the patient.    Past Medical History:  Diagnosis Date  . 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   . Cancer ([place])    prostate ca 1999/removal/ rad tx  . Carotid artery occlusion   . Hyperlipidemia   . Hypertension   . Incontinence   . Reflux   . Renal disorder     Patient Active Problem List   Diagnosis Date Noted  . Hyperlipidemia 08/05/2014  . Knee effusion, right 04/21/2012  . Unspecified essential hypertension 07/25/2011  . CAROTID ARTERY DISEASE 06/14/2010  . CLOSED FRACTURE OF ACROMIAL END OF CLAVICLE 04/25/2010    Past Surgical History:  Procedure Laterality Date  . cataracts    . HERNIA REPAIR    . radical prostectomy    . URINARY SPHINCTER IMPLANT          Home Medications    Prior to Admission medications   Medication Sig Start Date End Date Taking? Authorizing Provider  acetaminophen (TYLENOL) 500 MG tablet Take 500-1,000 mg by mouth as needed. For headache pain     [provider]  ALFALFA PO Take by mouth.    [provider]  amLODipine (NORVASC) 10 MG tablet Take 10 mg by  mouth every morning.      [provider]  ARIPiprazole (ABILIFY) 10 MG tablet Take 10 mg by mouth daily.    [provider]  aspirin EC 81 MG tablet Take 81 mg by mouth daily.      [provider]  benazepril (LOTENSIN) 40 MG tablet Take 40 mg by mouth daily.      [provider]  fish oil-omega-3 fatty acids 1000 MG capsule Take 1 g by mouth 2 (two) times daily.     [provider]  lansoprazole (PREVACID) 30 MG capsule Take 30 mg by mouth daily.      [provider]  Methylcellulose, Laxative, (CITRUCEL) 500 MG TABS Take 1 tablet by mouth daily.      [provider]  rosuvastatin (CRESTOR) 10 MG tablet Take 10 mg by mouth every morning.     [provider]    Family History Family History  Problem Relation Age of Onset  . Coronary artery disease Unknown        family history, male < 24  . Arthritis Unknown        family history  . Cancer Unknown   . Kidney disease Unknown     Social History Social History   Tobacco Use  . Smoking  status: Former Smoker    Packs/day: 2.00    Types: Cigarettes    Start date: 08/05/1962    Last attempt to quit: 12/02/1976    Years since quitting: 41.6  . Smokeless tobacco: Never Used  Substance Use Topics  . Alcohol use: No  . Drug use: No     Allergies   Ivp dye [iodinated diagnostic agents]; Nalbuphine; and Codeine   Review of Systems Review of Systems  Constitutional: Negative for activity change, appetite change and fever.  HENT: Negative for congestion, nosebleeds and postnasal drip.   Eyes: Negative for visual disturbance.  Respiratory: Negative for cough, chest tightness and shortness of breath.   Cardiovascular: Negative for chest pain and leg swelling.  Gastrointestinal: Negative for abdominal pain, nausea and vomiting.  Genitourinary: Positive for decreased urine volume, difficulty urinating, dysuria, hematuria and urgency. Negative for flank pain and  testicular pain.  Musculoskeletal: Negative for arthralgias, back pain and myalgias.  Neurological: Negative for dizziness, weakness and headaches.   all other systems are negative except as noted in the HPI and PMH.     Physical Exam Updated Vital Signs BP (!) 175/84 (BP Location: Right Arm)   Pulse (!) 128   Temp 98.4 F (36.9 C) (Oral)   Resp 20   Wt 86.2 kg (190 lb)   SpO2 100%   BMI 28.89 kg/m   Physical Exam  Constitutional: He is oriented to person, place, and time. He appears well-developed and well-nourished. No distress.  HENT:  Head: Normocephalic and atraumatic.  Mouth/Throat: Oropharynx is clear and moist. No oropharyngeal exudate.  Eyes: Pupils are equal, round, and reactive to light. Conjunctivae and EOM are normal.  Neck: Normal range of motion. Neck supple.  No meningismus.  Cardiovascular: Normal rate, normal heart sounds and intact distal pulses.  No murmur heard. tachycardic  Pulmonary/Chest: Effort normal and breath sounds normal. No respiratory distress.  Abdominal: Soft. There is no tenderness. There is no rebound and no guarding.  Genitourinary:  Genitourinary Comments: Uncircumcised.  There is some gross blood at the urethral meatus.  Patient's sphincter device is palpable and feels that is deflating with pressure but no urine is expressed.  Musculoskeletal: Normal range of motion. He exhibits no edema or tenderness.  Neurological: He is alert and oriented to person, place, and time. No cranial nerve deficit. He exhibits normal muscle tone. Coordination normal.  No ataxia on finger to nose bilaterally. No pronator drift. 5/5 strength throughout. CN 2-12 intact.Equal grip strength. Sensation intact.   Skin: Skin is warm. Capillary refill takes less than 2 seconds. No rash noted.  Psychiatric: He has a normal mood and affect. His behavior is normal.  Nursing note and vitals reviewed.    ED Treatments / Results  Labs (all labs ordered are listed,  but only abnormal results are displayed) Labs Reviewed  CBC WITH DIFFERENTIAL/PLATELET - Abnormal; Notable for the following components:      Result Value   WBC 17.8 (*)    RBC 3.36 (*)    Hemoglobin 10.3 (*)    HCT 31.4 (*)    Neutro Abs 16.1 (*)    Lymphs Abs 0.3 (*)    Monocytes Absolute 1.4 (*)    All other components within normal limits  BASIC METABOLIC PANEL - Abnormal; Notable for the following components:   CO2 19 (*)    Glucose, Bld 130 (*)    BUN 44 (*)    Creatinine, Ser 4.19 (*)    GFR calc  non Af Amer 12 (*)    GFR calc Af Amer 14 (*)    All other components within normal limits  URINE CULTURE  URINALYSIS, ROUTINE W REFLEX MICROSCOPIC    EKG None  Radiology No results found.  Procedures Procedures (including critical care time)  Medications Ordered in ED Medications  morphine 4 MG/ML injection 4 mg (has no administration in time range)     Initial Impression / Assessment and Plan / ED Course  I have reviewed the triage vital signs and the nursing notes.  Pertinent labs & imaging results that were available during my care of the patient were reviewed by me and considered in my medical decision making (see chart for details).    Patient with urinary retention with artificial sphincter in place.  Recently seen for malfunction of same.  Patient is tachycardic and uncomfortable. Attempts to deflate the sphincter device were unsuccessful.  Patient not able to urinate.  He does have gross blood from his urethra.  Discussed with urology at [facility] Dr. Eulis Manly.  He wishes to evaluate patient at [facility] ED.  Does not recommend attempted catheter placement with device device deflated due to gross bleeding and concern for possible urethral erosion.  Patient will be transferred to the ED for urology evaluation.  Patient remains uncomfortable but tachycardia is improving with pain control.  Leukocytosis noted.  Hemoglobin is stable.  Creatinine is 4.1.   Baseline appears to be around 2.8.  Suspect this is likely postobstructive though will need to be reassessed Final Clinical Impressions(s) / ED Diagnoses   Final diagnoses:  Urinary retention  Hematuria, unspecified type    ED Discharge Orders    None       Kaelea Gathright, Annie Main, MD 07/08/18 [phone]

## 2018-07-09 MED ORDER — GENERIC EXTERNAL MEDICATION
17.00 g | Status: DC
Start: ? — End: 2018-07-09

## 2018-07-09 MED ORDER — PNEUMOCOCCAL VAC POLYVALENT 25 MCG/0.5ML IJ INJ
0.50 | INJECTION | INTRAMUSCULAR | Status: DC
Start: ? — End: 2018-07-09

## 2018-07-17 DIAGNOSIS — R6520 Severe sepsis without septic shock: Secondary | ICD-10-CM | POA: Diagnosis not present

## 2018-07-17 DIAGNOSIS — Z79899 Other long term (current) drug therapy: Secondary | ICD-10-CM | POA: Diagnosis not present

## 2018-07-17 DIAGNOSIS — F419 Anxiety disorder, unspecified: Secondary | ICD-10-CM | POA: Diagnosis not present

## 2018-07-17 DIAGNOSIS — N1339 Other hydronephrosis: Secondary | ICD-10-CM | POA: Diagnosis not present

## 2018-07-17 DIAGNOSIS — N185 Chronic kidney disease, stage 5: Secondary | ICD-10-CM | POA: Diagnosis not present

## 2018-07-17 DIAGNOSIS — I129 Hypertensive chronic kidney disease with stage 1 through stage 4 chronic kidney disease, or unspecified chronic kidney disease: Secondary | ICD-10-CM | POA: Diagnosis not present

## 2018-07-17 DIAGNOSIS — I2510 Atherosclerotic heart disease of native coronary artery without angina pectoris: Secondary | ICD-10-CM | POA: Diagnosis not present

## 2018-07-17 DIAGNOSIS — I10 Essential (primary) hypertension: Secondary | ICD-10-CM | POA: Diagnosis not present

## 2018-07-17 DIAGNOSIS — R7881 Bacteremia: Secondary | ICD-10-CM | POA: Diagnosis not present

## 2018-07-17 DIAGNOSIS — A419 Sepsis, unspecified organism: Secondary | ICD-10-CM | POA: Diagnosis not present

## 2018-07-17 DIAGNOSIS — Z8546 Personal history of malignant neoplasm of prostate: Secondary | ICD-10-CM | POA: Diagnosis not present

## 2018-07-17 DIAGNOSIS — N184 Chronic kidney disease, stage 4 (severe): Secondary | ICD-10-CM | POA: Diagnosis not present

## 2018-07-17 DIAGNOSIS — I480 Paroxysmal atrial fibrillation: Secondary | ICD-10-CM | POA: Diagnosis not present

## 2018-07-17 DIAGNOSIS — R531 Weakness: Secondary | ICD-10-CM | POA: Diagnosis not present

## 2018-07-17 DIAGNOSIS — R1030 Lower abdominal pain, unspecified: Secondary | ICD-10-CM | POA: Diagnosis not present

## 2018-07-17 DIAGNOSIS — D696 Thrombocytopenia, unspecified: Secondary | ICD-10-CM | POA: Diagnosis not present

## 2018-07-17 DIAGNOSIS — R319 Hematuria, unspecified: Secondary | ICD-10-CM | POA: Diagnosis not present

## 2018-07-17 DIAGNOSIS — Z87898 Personal history of other specified conditions: Secondary | ICD-10-CM | POA: Diagnosis not present

## 2018-07-17 DIAGNOSIS — N179 Acute kidney failure, unspecified: Secondary | ICD-10-CM | POA: Diagnosis not present

## 2018-07-17 DIAGNOSIS — Z9689 Presence of other specified functional implants: Secondary | ICD-10-CM | POA: Diagnosis not present

## 2018-07-17 DIAGNOSIS — N390 Urinary tract infection, site not specified: Secondary | ICD-10-CM | POA: Diagnosis not present

## 2018-07-17 DIAGNOSIS — A4151 Sepsis due to Escherichia coli [E. coli]: Secondary | ICD-10-CM | POA: Diagnosis not present

## 2018-07-17 DIAGNOSIS — Z87891 Personal history of nicotine dependence: Secondary | ICD-10-CM | POA: Diagnosis not present

## 2018-07-17 DIAGNOSIS — Z7982 Long term (current) use of aspirin: Secondary | ICD-10-CM | POA: Diagnosis not present

## 2018-07-17 DIAGNOSIS — L304 Erythema intertrigo: Secondary | ICD-10-CM | POA: Diagnosis not present

## 2018-07-17 DIAGNOSIS — R262 Difficulty in walking, not elsewhere classified: Secondary | ICD-10-CM | POA: Diagnosis not present

## 2018-07-17 DIAGNOSIS — M6281 Muscle weakness (generalized): Secondary | ICD-10-CM | POA: Diagnosis not present

## 2018-07-17 DIAGNOSIS — D649 Anemia, unspecified: Secondary | ICD-10-CM | POA: Diagnosis not present

## 2018-07-17 DIAGNOSIS — I120 Hypertensive chronic kidney disease with stage 5 chronic kidney disease or end stage renal disease: Secondary | ICD-10-CM | POA: Diagnosis not present

## 2018-07-17 DIAGNOSIS — R339 Retention of urine, unspecified: Secondary | ICD-10-CM | POA: Diagnosis present

## 2018-07-17 DIAGNOSIS — N393 Stress incontinence (female) (male): Secondary | ICD-10-CM | POA: Diagnosis not present

## 2018-07-17 DIAGNOSIS — R310 Gross hematuria: Secondary | ICD-10-CM | POA: Diagnosis not present

## 2018-07-18 ENCOUNTER — Encounter: Payer: Self-pay | Admitting: Internal Medicine

## 2018-07-18 ENCOUNTER — Encounter (HOSPITAL_COMMUNITY)
Admission: RE | Admit: 2018-07-18 | Discharge: 2018-07-18 | Disposition: A | Discharge status: Home / Self Care | Payer: MEDICARE | Source: Skilled Nursing Facility | Attending: Internal Medicine | Admitting: Internal Medicine

## 2018-07-18 DIAGNOSIS — A4151 Sepsis due to Escherichia coli [E. coli]: Secondary | ICD-10-CM | POA: Insufficient documentation

## 2018-07-18 LAB — CBC WITH DIFFERENTIAL/PLATELET
Basophils Absolute: 0 10*3/uL (ref 0.0–0.1)
Basophils Relative: 0 %
Eosinophils Absolute: 0.1 10*3/uL (ref 0.0–0.7)
Eosinophils Relative: 2 %
HCT: 28.8 % — ABNORMAL LOW (ref 39.0–52.0)
Hemoglobin: 9.5 g/dL — ABNORMAL LOW (ref 13.0–17.0)
Lymphocytes Relative: 16 %
Lymphs Abs: 1.2 10*3/uL (ref 0.7–4.0)
MCH: 30.8 pg (ref 26.0–34.0)
MCHC: 33 g/dL (ref 30.0–36.0)
MCV: 93.5 fL (ref 78.0–100.0)
Monocytes Absolute: 0.6 10*3/uL (ref 0.1–1.0)
Monocytes Relative: 8 %
Neutro Abs: 5.8 10*3/uL (ref 1.7–7.7)
Neutrophils Relative %: 74 %
Platelets: 264 10*3/uL (ref 150–400)
RBC: 3.08 MIL/uL — ABNORMAL LOW (ref 4.22–5.81)
RDW: 13.8 % (ref 11.5–15.5)
WBC: 7.8 10*3/uL (ref 4.0–10.5)

## 2018-07-18 LAB — BASIC METABOLIC PANEL
Anion gap: 11 (ref 5–15)
BUN: 84 mg/dL — ABNORMAL HIGH (ref 8–23)
CO2: 21 mmol/L — ABNORMAL LOW (ref 22–32)
Calcium: 8.8 mg/dL — ABNORMAL LOW (ref 8.9–10.3)
Chloride: 106 mmol/L (ref 98–111)
Creatinine, Ser: 3.58 mg/dL — ABNORMAL HIGH (ref 0.61–1.24)
GFR calc Af Amer: 17 mL/min — ABNORMAL LOW (ref >60)
GFR calc non Af Amer: 15 mL/min — ABNORMAL LOW (ref >60)
Glucose, Bld: 92 mg/dL (ref 70–99)
Potassium: 4.8 mmol/L (ref 3.5–5.1)
Sodium: 138 mmol/L (ref 135–145)

## 2018-07-19 ENCOUNTER — Emergency Department (HOSPITAL_COMMUNITY): Payer: MEDICARE

## 2018-07-19 ENCOUNTER — Inpatient Hospital Stay
Admission: RE | Admit: 2018-07-19 | Discharge: 2018-07-20 | Disposition: A | Discharge status: Nursing Home (Includes ICF) | Payer: MEDICARE | Source: Ambulatory Visit | Attending: Internal Medicine | Admitting: Internal Medicine

## 2018-07-19 ENCOUNTER — Non-Acute Institutional Stay (SKILLED_NURSING_FACILITY): Payer: MEDICARE | Admitting: Internal Medicine

## 2018-07-19 ENCOUNTER — Encounter (HOSPITAL_COMMUNITY): Payer: Self-pay | Admitting: Emergency Medicine

## 2018-07-19 ENCOUNTER — Emergency Department (HOSPITAL_COMMUNITY)
Admission: EM | Admit: 2018-07-19 | Discharge: 2018-07-19 | Disposition: A | Discharge status: Home / Self Care | Payer: MEDICARE | Attending: Emergency Medicine | Admitting: Emergency Medicine

## 2018-07-19 DIAGNOSIS — R310 Gross hematuria: Secondary | ICD-10-CM | POA: Insufficient documentation

## 2018-07-19 DIAGNOSIS — F419 Anxiety disorder, unspecified: Secondary | ICD-10-CM | POA: Diagnosis not present

## 2018-07-19 DIAGNOSIS — N184 Chronic kidney disease, stage 4 (severe): Secondary | ICD-10-CM

## 2018-07-19 DIAGNOSIS — Z7982 Long term (current) use of aspirin: Secondary | ICD-10-CM | POA: Insufficient documentation

## 2018-07-19 DIAGNOSIS — I10 Essential (primary) hypertension: Secondary | ICD-10-CM | POA: Diagnosis not present

## 2018-07-19 DIAGNOSIS — Z87891 Personal history of nicotine dependence: Secondary | ICD-10-CM | POA: Insufficient documentation

## 2018-07-19 DIAGNOSIS — I480 Paroxysmal atrial fibrillation: Secondary | ICD-10-CM | POA: Diagnosis not present

## 2018-07-19 DIAGNOSIS — N185 Chronic kidney disease, stage 5: Secondary | ICD-10-CM | POA: Diagnosis not present

## 2018-07-19 DIAGNOSIS — Z87898 Personal history of other specified conditions: Secondary | ICD-10-CM

## 2018-07-19 DIAGNOSIS — N1339 Other hydronephrosis: Secondary | ICD-10-CM | POA: Diagnosis not present

## 2018-07-19 DIAGNOSIS — Z79899 Other long term (current) drug therapy: Secondary | ICD-10-CM | POA: Insufficient documentation

## 2018-07-19 DIAGNOSIS — I2510 Atherosclerotic heart disease of native coronary artery without angina pectoris: Secondary | ICD-10-CM | POA: Insufficient documentation

## 2018-07-19 DIAGNOSIS — N390 Urinary tract infection, site not specified: Secondary | ICD-10-CM | POA: Diagnosis not present

## 2018-07-19 DIAGNOSIS — L304 Erythema intertrigo: Secondary | ICD-10-CM

## 2018-07-19 DIAGNOSIS — Z8546 Personal history of malignant neoplasm of prostate: Secondary | ICD-10-CM | POA: Insufficient documentation

## 2018-07-19 DIAGNOSIS — I120 Hypertensive chronic kidney disease with stage 5 chronic kidney disease or end stage renal disease: Secondary | ICD-10-CM | POA: Diagnosis not present

## 2018-07-19 DIAGNOSIS — A419 Sepsis, unspecified organism: Secondary | ICD-10-CM | POA: Insufficient documentation

## 2018-07-19 DIAGNOSIS — Z960 Presence of urogenital implants: Secondary | ICD-10-CM

## 2018-07-19 DIAGNOSIS — Z9689 Presence of other specified functional implants: Secondary | ICD-10-CM | POA: Insufficient documentation

## 2018-07-19 DIAGNOSIS — R1030 Lower abdominal pain, unspecified: Secondary | ICD-10-CM | POA: Insufficient documentation

## 2018-07-19 HISTORY — DX: Presence of urogenital implants: Z96.0

## 2018-07-19 LAB — COMPREHENSIVE METABOLIC PANEL
ALT: 46 U/L — ABNORMAL HIGH (ref 0–44)
AST: 42 U/L — ABNORMAL HIGH (ref 15–41)
Albumin: 3.6 g/dL (ref 3.5–5.0)
Alkaline Phosphatase: 108 U/L (ref 38–126)
Anion gap: 11 (ref 5–15)
BUN: 89 mg/dL — ABNORMAL HIGH (ref 8–23)
CO2: 18 mmol/L — ABNORMAL LOW (ref 22–32)
Calcium: 9 mg/dL (ref 8.9–10.3)
Chloride: 106 mmol/L (ref 98–111)
Creatinine, Ser: 3.71 mg/dL — ABNORMAL HIGH (ref 0.61–1.24)
GFR calc Af Amer: 16 mL/min — ABNORMAL LOW (ref >60)
GFR calc non Af Amer: 14 mL/min — ABNORMAL LOW (ref >60)
Glucose, Bld: 115 mg/dL — ABNORMAL HIGH (ref 70–99)
Potassium: 5.1 mmol/L (ref 3.5–5.1)
Sodium: 135 mmol/L (ref 135–145)
Total Bilirubin: 0.7 mg/dL (ref 0.3–1.2)
Total Protein: 7.5 g/dL (ref 6.5–8.1)

## 2018-07-19 LAB — CBC WITH DIFFERENTIAL/PLATELET
Basophils Absolute: 0 10*3/uL (ref 0.0–0.1)
Basophils Relative: 0 %
Eosinophils Absolute: 0 10*3/uL (ref 0.0–0.7)
Eosinophils Relative: 0 %
HCT: 29 % — ABNORMAL LOW (ref 39.0–52.0)
Hemoglobin: 9.4 g/dL — ABNORMAL LOW (ref 13.0–17.0)
Lymphocytes Relative: 5 %
Lymphs Abs: 0.5 10*3/uL — ABNORMAL LOW (ref 0.7–4.0)
MCH: 30.1 pg (ref 26.0–34.0)
MCHC: 32.4 g/dL (ref 30.0–36.0)
MCV: 92.9 fL (ref 78.0–100.0)
Monocytes Absolute: 0.5 10*3/uL (ref 0.1–1.0)
Monocytes Relative: 4 %
Neutro Abs: 9.7 10*3/uL — ABNORMAL HIGH (ref 1.7–7.7)
Neutrophils Relative %: 91 %
Platelets: 314 10*3/uL (ref 150–400)
RBC: 3.12 MIL/uL — ABNORMAL LOW (ref 4.22–5.81)
RDW: 13.9 % (ref 11.5–15.5)
WBC: 10.7 10*3/uL — ABNORMAL HIGH (ref 4.0–10.5)

## 2018-07-19 LAB — URINALYSIS, ROUTINE W REFLEX MICROSCOPIC
Bilirubin Urine: NEGATIVE
Glucose, UA: NEGATIVE mg/dL
Hgb urine dipstick: NEGATIVE
Ketones, ur: NEGATIVE mg/dL
Leukocytes, UA: NEGATIVE
Nitrite: NEGATIVE
Protein, ur: NEGATIVE mg/dL

## 2018-07-19 LAB — URINALYSIS, MICROSCOPIC (REFLEX): RBC / HPF: >50 RBC/hpf (ref 0–5)

## 2018-07-19 MED ORDER — HYDROMORPHONE HCL 1 MG/ML IJ SOLN
1.0000 mg | Freq: Once | INTRAMUSCULAR | Status: AC
  Administered 2018-07-19: 1 mg via INTRAVENOUS
  Filled 2018-07-19: qty 1

## 2018-07-19 MED ORDER — MORPHINE SULFATE (PF) 4 MG/ML IV SOLN
4.0000 mg | Freq: Once | INTRAVENOUS | Status: AC
  Administered 2018-07-19: 4 mg via INTRAVENOUS
  Filled 2018-07-19: qty 1

## 2018-07-19 MED ORDER — METOPROLOL TARTRATE 5 MG/5ML IV SOLN
5.0000 mg | Freq: Once | INTRAVENOUS | Status: AC
  Administered 2018-07-19: 5 mg via INTRAVENOUS
  Filled 2018-07-19: qty 5

## 2018-07-19 MED ORDER — LORAZEPAM 2 MG/ML IJ SOLN
1.0000 mg | Freq: Once | INTRAMUSCULAR | Status: AC
  Administered 2018-07-19: 1 mg via INTRAVENOUS
  Filled 2018-07-19: qty 1

## 2018-07-19 MED ORDER — ONDANSETRON HCL 4 MG/2ML IJ SOLN
4.0000 mg | Freq: Once | INTRAMUSCULAR | Status: AC
  Administered 2018-07-19: 4 mg via INTRAVENOUS
  Filled 2018-07-19: qty 2

## 2018-07-19 MED ORDER — CLOTRIMAZOLE 1 % EX CREA
TOPICAL_CREAM | Freq: Once | CUTANEOUS | Status: AC
  Administered 2018-07-19: 19:00:00 via TOPICAL
  Filled 2018-07-19 (×2): qty 15

## 2018-07-19 MED ORDER — METOPROLOL TARTRATE 25 MG PO TABS
12.50 | ORAL_TABLET | ORAL | Status: DC
Start: 2018-07-17 — End: 2018-07-19

## 2018-07-19 MED ORDER — CIPROFLOXACIN HCL 500 MG PO TABS
500.00 | ORAL_TABLET | ORAL | Status: DC
Start: 2018-07-18 — End: 2018-07-19

## 2018-07-19 MED ORDER — CLOTRIMAZOLE 1 % EX CREA: TOPICAL_CREAM | CUTANEOUS | 0 refills | Status: DC

## 2018-07-19 MED ORDER — SERTRALINE HCL 25 MG PO TABS
25.00 | ORAL_TABLET | ORAL | Status: DC
Start: 2018-07-18 — End: 2018-07-19

## 2018-07-19 MED ORDER — SODIUM CHLORIDE 0.9 % IV BOLUS
1000.0000 mL | Freq: Once | INTRAVENOUS | Status: AC
  Administered 2018-07-19: 1000 mL via INTRAVENOUS

## 2018-07-19 NOTE — ED Notes (Signed)
Notified Dr Gilford Raid that pt was having periods of apnea and that O2 sats drop to 70's. Placed pt on O2 @2L  [place]. Will continue to monitor.

## 2018-07-19 NOTE — ED Notes (Signed)
Condom cath removed and urinal placed under penis per orders Dr Gilford Raid.

## 2018-07-19 NOTE — ED Triage Notes (Signed)
Pt returned to [facility] on 07/17/18 at [facility] for e coli UTI and sepsis.  States he has not urinated since this morning and is having lower abd pressure.  Is currently on Cipro for UTI.

## 2018-07-19 NOTE — Progress Notes (Signed)
This is an acute visit.  Level of care skilled.  Facility is [facility].  Chief complaint- acute visit status post hospitalization for sepsis with bacteremia secondary to urinary tract infection- complicated with urinary retention.  History of present illness.  Patient is a pleasant 84 year old male with a history of chronic kidney disease stage IV as well as prostate cancer status post radical prostatectomy and adjunctive radiation with artificial urinary sphincter placement in 2012.  He also has a history of depression as well as hypertension.  He originally presented to [facility] emergency department for urinary retention hematuria and fevers.  He had extensive groin pain.  He also was hypotensive and tachycardic.   Attempt to deflate his sphincter device were unsuccessful in the ED and he was not able to urinate.  Urology at [facility] was consulted and recommended that he be evaluated in the emergency department at [facility].  Subsequently he was admitted there for sepsis with E. coli bacteremia and urinary tract infection as well as hematuria thought secondary to hemorrhagic cystitis.  He was started on vancomycin and Zosyn empirically-renal ultrasound was negative for apparent nephrotic abscess and hydronephrosis.  Blood cultures grew out E. coli-antibiotics were de-escalated to Rocephin.  He was switched to Cipro on August 11 with end date August 20.  He did have a cystourethroscopy.  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  He will need follow-up with urology and have urgent evaluation if he has trouble urinating with residuals greater than 200 cc after urinating.  On August 9 he did develop atrial fibrillation a rapid ventricular rate heart rate was up to the 150s-she responded well to metoprolol and has been continued on this.  Anticoagulation was not started because the risk of bleeding outweigh benefits because of  his hematuria.  A zio  patch was placed for further monitoring  In regards to acute on chronic kidney disease stage IV- he was diuresed intermittently with improvement of his creatinine.  Was thought his creatinine was probably also elevated because of the insult to his renal system with what was going on.  He will need follow-up with nephrology.  He required intermittent Lasix and this may need to be restarted at some point.  Was not started on discharge from hospital because of concern of prerenal etiology of acute kidney injury.  He also has thrombocytopenia which appears to have improved- this was thought probably caused by acute infection.  Patient also has a history of depression he was started on Sertaline  -continues on Abilify.--Recommendation was to possibly titrate up his antidepressant at some point  Was also noted he had somewhat of a flattened affect thought could be due to Abilify Parkinson's or infection.  Currently he continues to have some hematuria but appears comfortable he does not really have any acute complaints-he appears to have good urinary output his diaper is soaked.  Vital signs appear to be stable.  Past Medical History:  Diagnosis Date  . 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   . Cancer ([place])    prostate ca 1999/removal/ rad tx  . Carotid artery occlusion   . Hyperlipidemia   . Hypertension   . Incontinence   . Reflux   . Renal disorder   . Status post implantation of artificial urinary sphincter         Past Surgical History:  Procedure Laterality Date  .  cataracts    . HERNIA REPAIR    . radical prostectomy    . URINARY SPHINCTER IMPLANT           Allergies  Allergen Reactions  . Ivp Dye [Iodinated Diagnostic Agents]     Due to partial renal failure  . Nalbuphine Other (See Comments)    PATIENT STATES IT MADE HIM FEEL LIKE HE WAS  GOING TO DIE. NO SPECIFICS  . Codeine Anxiety    MEDICATIONS MEDICATIONS     Medication Sig  . acetaminophen (TYLENOL) 500 MG tablet Take 500 mg by mouth daily as needed for mild pain or moderate pain. For headache pain   . acetaminophen (TYLENOL) 500 MG tablet Take 1,000 mg by mouth daily as needed for moderate pain.  Marland Kitchen ALFALFA PO Take 250 mg by mouth daily.   . ARIPiprazole (ABILIFY) 15 MG tablet Take 15 mg by mouth daily.   Marland Kitchen aspirin EC 81 MG tablet Take 81 mg by mouth daily.    . ciprofloxacin (CIPRO) 500 MG tablet Take 500 mg by mouth daily. 4 day course starting on 07/17/2018  . fish oil-omega-3 fatty acids 1000 MG capsule Take 1 g by mouth 2 (two) times daily.   . Flaxseed, Linseed, (FLAX SEED OIL) 1000 MG CAPS Take 1 capsule by mouth daily.  . Methylcellulose, Laxative, (CITRUCEL) 500 MG TABS Take 1 tablet by mouth daily as needed (for constipation).   . metoprolol tartrate (LOPRESSOR) 25 MG tablet Take 12.5 mg by mouth 2 (two) times daily.  . Multiple Vitamin (MULTIVITAMIN WITH MINERALS) TABS tablet Take 1 tablet by mouth daily.  . pantoprazole (PROTONIX) 40 MG tablet Take 40 mg by mouth daily.  . rosuvastatin (CRESTOR) 10 MG tablet Take 10 mg by mouth daily.   . sertraline (ZOLOFT) 25 MG tablet Take 25 mg by mouth daily.  . vitamin B-12 (CYANOCOBALAMIN) 1000 MCG tablet Take 1,000 mcg by mouth daily.        Review of systems.  General is not complaining of fever chills.  Skin does not complain of itching or diaphoresis.  Head ears eyes nose mouth and throat is not complaining of any sore throat or visual changes.  Respiratory does not complain of being short of breath or having increased cough.  Cardiac is not complaining of chest pain does not appear to have significant lower extremity edema.  GI is not really complaining of abdominal pain says his appetite is not that great-does not complain of nausea vomiting or diarrhea.  GU is not really complaining of  dysuria at this time but does have continued hematuria-does not really complain of suprapubic pain.  Musculoskeletal he has weak but does not complain of joint pain.  Neurologic does not complain of dizziness headache or syncope.  Psych does have a history of some depression has somewhat of a flat affect but he is pleasant-   Physical exam.  Temperature is 97.2 pulse 73 respirations 18 blood pressure 142/66 oxygen saturation is in the 90s on room air  In general this is a pleasant elderly male in no distress lying comfortably in bed.  His skin is warm and dry.  Eyes visual acuity appears to be intact sclera and conjunctive are clear.  Oropharynx is clear mucous membranes moist.  Chest is clear to auscultation there is no labored breathing.  Heart is regular rate and rhythm without murmur gallop or rub he does not really have significant lower extremity edema pedal pulses are intact bilaterally.  He  does have aZio patch applied to thorax.  Abdomen is soft does not appear to be tender there are positive bowel sounds.  GU I could not appreciate any active bleeding from the meatus- he does appear to have some blood-tinged urine in the diaper and the diaper is fairly soaked  Could not really appreciate suprapubic distention or tenderness   Musculoskeletal Limited exam since he is in bed but appears able to move all extremities x4 with baseline strength some lower extremity weakness I suspect deconditioning.  Neurologic is grossly intact his speech is clear no lateralizing findings cranial nerves appear to be intact.  Psych he is alert and oriented has somewhat of a flat affect but does talk is appropriate- fact stated he was concerned about his socks because he is a [place] fan and socks are[employer] blue   Labs.  July 18, 2018.  WBC 7.8 hemoglobin 9.5 platelets 265.  Sodium 138 potassium 4.8 BUN 84 creatinine 3.58   Assessment and plan.  1.  History of sepsis with E.  coli bacteremia-as well as hematuria secondary to hemorrhagic cystitis-.  He does have a history of an artificial urinary sphincter- this was thought to be functioning properly.  He had significant hematuria- renal ultrasound did not show any acute process- thought to have possibly element of hemorrhagic cystitis--she continues on Cipro through August 20.  He will need urology follow-up in approximately 2 weeks.  His artificial urinary sphincter has been deactivated so urinary leakage should be expected- sphincter would not be reactivated until urology evaluates him.   Also monitor for urinary output if has difficulty urinating we will need to do a bladder scan if greater than 200 cc residual will need expedient evaluation  #2 history of acute on chronic kidney disease- he will need nephrology follow-up-she did require intermittent diuresis in the hospital- thought his baseline recently increased because of recent insults to the renal system with his infection and hemorrhagic cystitis.  Creatinine day appears relatively baseline with hospital values at [facility]- BUN has gone up somewhat into the 80s from the 60s- some of this may be due to his hematuria-but this will have to be watched will update this again in 2 days- he appears to have decent urinary output-but this will have to be watched closely---  #3- history of atrial fibrillation with rapid ventricular rate-he was started on metoprolol and this appears to be effective- anticoagulation not started because a risk of bleeding at this point with the hematuria but this would warrant reevaluation at some point- he does have a ZIO Patch placed for further monitoring. He does continue on low-dose aspirin   #4 thrombocytopenia this appears to have resolved thought possibly due to the infectious process again this will be updated early next week platelet count was 264,000 today.  5.  Depression- he continues on Abilify and Sertraline was  started in the hospital with recommendation to titrate this up at some point.  He does have somewhat of a flat affect but appears to be in relatively good spirits this evening-at this point will monitor I suspect he would benefit from a psychiatric consult.  There is also some thought possibly has flattened affect may be medication related to the Abilify or possibly Parkinson's or possibly caused by the infection.  At this point continue to monitor.  6.-Hypertension-his Norvasc   is currently on hold secondary to recommendation from [facility] is in the low 097D today at this point will monitor  [id number] note greater than 50 minutes spent assessing patient- discussing status with nursing staff- reviewing his chart and labs-and coordinating and formulating a plan of care for numerous diagnoses of note greater than 50% of time spent coordinating a plan of care

## 2018-07-19 NOTE — ED Provider Notes (Signed)
[facility] EMERGENCY DEPARTMENT Provider Note   CSN: [id number] Arrival date & time: 07/19/18  1442     History   Chief Complaint Chief Complaint  Patient presents with  . Urinary Retention    HPI [name] [name] is a 84 y.o. male.  Pt presents to the ED today with urinary retention.  He said he had not urinated since 0800 this morning.  The pt has a hx of prostate cancer s/p radical prostatectomy with an artificial urinary sphincter placed in 2012.  He was admitted to [facility] from 8/7-8/16 for sepsis secondary to e.coli uti.  The pt was d/c on cipro after on rocephin, vancomycin, and zosyn.  The pt was seen by urology and had a cystourethroscopy with bilateral retrograde pyelogram notable for pale mucosa within bladder with injected appearing vessels which were friable with multiple areas of minor bleeding consistent with radiation cystitis, normal bilateral retrograde pyelograms and no signs of erosion of AUS erosion.  They told him to return for urgent evaluation if post void bladder scans greater than 200cc and unable to empty bladder.  AUS has been deactivated, so urinary leakage is expected.  No foley should be placed at any time.  Labs from admission at [facility]:  Significant Diagnostic Studies:  CBC:   Recent Labs Lab 07/29/2018 0600 07/14/18 0702 07/15/18 1013 07/16/18 0746  WBC 7.3 7.6 7.8 8.9  HGB 9.0* 8.4* 9.0* 9.4*  HCT 25.6* 24.6* 26.0* 27.4*  PLT 98* 123* 170 220   Recent Labs Lab 07/10/18 0051 07/11/18 0654 29-Jul-2018 0600 07/14/18 0702 07/15/18 1013 07/16/18 0746  NA 143 138 < > 139 139 139 137  K 4.4 4.1 < > 4.3 4.3 4.7 4.8  CL 112* 107 < > 108 108 108 104  CO2 21* 23 < > 23 23 22* 20*  BUN 59* 68* < > 57* 61* 64* 64*  CREATININE 4.28* 4.25* < > 3.37* 3.40* 3.32* 3.23*  GLU 96 102* < > 102* 94 94 121*  CALCIUM 8.1* 8.0* < > 8.2* 8.1* 8.3* 8.6  MG 2.7* 2.4 -- -- -- -- --  PHOS 3.9 3.9 -- -- -- -- --  BILITOT 0.5 0.4 -- -- -- -- --  AST 40 52* -- --  -- -- --  ALT 15 29 -- -- -- -- --  ALKPHOS 55 161* -- -- -- -- --  PROT 5.2* 5.4* -- -- -- -- --  ALBUMIN 2.8* 3.1* -- -- -- -- --       Past Medical History:  Diagnosis Date  . 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   . Cancer ([place])    prostate ca 1999/removal/ rad tx  . Carotid artery occlusion   . Hyperlipidemia   . Hypertension   . Incontinence   . Reflux   . Renal disorder   . Status post implantation of artificial urinary sphincter     Patient Active Problem List   Diagnosis Date Noted  . Hyperlipidemia 08/05/2014  . Knee effusion, right 04/21/2012  . Unspecified essential hypertension 07/25/2011  . CAROTID ARTERY DISEASE 06/14/2010  . CLOSED FRACTURE OF ACROMIAL END OF CLAVICLE 04/25/2010    Past Surgical History:  Procedure Laterality Date  . cataracts    . HERNIA REPAIR    . radical prostectomy    . URINARY SPHINCTER IMPLANT          Home Medications  Prior to Admission medications   Medication Sig Start Date End Date Taking? Authorizing Provider  acetaminophen (TYLENOL) 500 MG tablet Take 500 mg by mouth daily as needed for mild pain or moderate pain. For headache pain    Yes [provider]  ALFALFA PO Take 250 mg by mouth daily.    Yes [provider]  ARIPiprazole (ABILIFY) 15 MG tablet Take 15 mg by mouth daily.    Yes [provider]  aspirin EC 81 MG tablet Take 81 mg by mouth daily.     Yes [provider]  ciprofloxacin (CIPRO) 500 MG tablet Take 500 mg by mouth daily. 4 day course starting on 07/17/2018   Yes [provider]  fish oil-omega-3 fatty acids 1000 MG capsule Take 1 g by mouth 2 (two) times daily.    Yes [provider]  Flaxseed, Linseed, (FLAX SEED OIL) 1000 MG CAPS Take 1 capsule by mouth daily.   Yes [provider]  Methylcellulose, Laxative, (CITRUCEL) 500 MG TABS Take 1 tablet by mouth  daily as needed (for constipation).    Yes [provider]  metoprolol tartrate (LOPRESSOR) 25 MG tablet Take 12.5 mg by mouth 2 (two) times daily.   Yes [provider]  Multiple Vitamin (MULTIVITAMIN WITH MINERALS) TABS tablet Take 1 tablet by mouth daily.   Yes [provider]  pantoprazole (PROTONIX) 40 MG tablet Take 40 mg by mouth daily.   Yes [provider]  rosuvastatin (CRESTOR) 10 MG tablet Take 10 mg by mouth daily.    Yes [provider]  sertraline (ZOLOFT) 25 MG tablet Take 25 mg by mouth daily.   Yes [provider]  vitamin B-12 (CYANOCOBALAMIN) 1000 MCG tablet Take 1,000 mcg by mouth daily.   Yes [provider]  clotrimazole (LOTRIMIN) 1 % cream Apply to affected area 2 times daily 07/19/18   Isla Pence, MD    Family History Family History  Problem Relation Age of Onset  . Coronary artery disease Unknown        family history, male < 110  . Arthritis Unknown        family history  . Cancer Unknown   . Kidney disease Unknown     Social History Social History   Tobacco Use  . Smoking status: Former Smoker    Packs/day: 2.00    Types: Cigarettes    Start date: 08/05/1962    Last attempt to quit: 12/02/1976    Years since quitting: 41.6  . Smokeless tobacco: Never Used  Substance Use Topics  . Alcohol use: No  . Drug use: No     Allergies   Ivp dye [iodinated diagnostic agents]; Nalbuphine; and Codeine   Review of Systems Review of Systems  Genitourinary: Positive for decreased urine volume and difficulty urinating.  All other systems reviewed and are negative.    Physical Exam Updated Vital Signs BP (!) 109/51   Pulse (!) 107   Temp 98 F (36.7 C) (Oral)   Resp 13   Ht 5\' 8"  (1.727 m)   Wt 79.4 kg   SpO2 93%   BMI 26.61 kg/m   Physical Exam  Constitutional: He is oriented to person, place, and time. He appears well-developed and well-nourished.  HENT:  Head: Normocephalic and  atraumatic.  Right Ear: External ear normal.  Left Ear: External ear normal.  Nose: Nose normal.  Mouth/Throat: Oropharynx is clear and moist.  Eyes: Pupils are equal, round, and reactive  to light. Conjunctivae and EOM are normal.  Neck: Normal range of motion. Neck supple.  Cardiovascular: Normal rate, regular rhythm, normal heart sounds and intact distal pulses.  Pulmonary/Chest: Effort normal and breath sounds normal.  Abdominal: Soft. Bowel sounds are normal. There is tenderness in the right lower quadrant, suprapubic area and left lower quadrant.  Genitourinary:  Genitourinary Comments: A little dried blood at the head of the meatus.  Groin intertrigo noted  Musculoskeletal: Normal range of motion.  Neurological: He is alert and oriented to person, place, and time.  Skin: Skin is warm and dry. Capillary refill takes less than 2 seconds.  Psychiatric: He has a normal mood and affect. His behavior is normal. Judgment and thought content normal.  Nursing note and vitals reviewed.    ED Treatments / Results  Labs (all labs ordered are listed, but only abnormal results are displayed) Labs Reviewed  COMPREHENSIVE METABOLIC PANEL - Abnormal; Notable for the following components:      Result Value   CO2 18 (*)    Glucose, Bld 115 (*)    BUN 89 (*)    Creatinine, Ser 3.71 (*)    AST 42 (*)    ALT 46 (*)    GFR calc non Af Amer 14 (*)    GFR calc Af Amer 16 (*)    All other components within normal limits  CBC WITH DIFFERENTIAL/PLATELET - Abnormal; Notable for the following components:   WBC 10.7 (*)    RBC 3.12 (*)    Hemoglobin 9.4 (*)    HCT 29.0 (*)    Neutro Abs 9.7 (*)    Lymphs Abs 0.5 (*)    All other components within normal limits  URINALYSIS, ROUTINE W REFLEX MICROSCOPIC - Abnormal; Notable for the following components:   Color, Urine RED (*)    APPearance TURBID (*)    All other components within normal limits  URINALYSIS, MICROSCOPIC (REFLEX) - Abnormal;  Notable for the following components:   Bacteria, UA FEW (*)    Non Squamous Epithelial PRESENT (*)    All other components within normal limits  URINE CULTURE    EKG EKG Interpretation  Date/Time:  Sunday July 19 2018 19:53:02 EDT Ventricular Rate:  131 PR Interval:    QRS Duration: 96 QT Interval:  330 QTC Calculation: 488 R Axis:   30 Text Interpretation:  Atrial fibrillation Repolarization abnormality, prob rate related Confirmed by Isla Pence [phone]) on 07/19/2018 7:57:41 PM   Radiology Ct Abdomen Pelvis Wo Contrast  Result Date: 07/19/2018 CLINICAL DATA:  84 year old male with acute abdominal pain and UTI/sepsis. EXAM: CT ABDOMEN AND PELVIS WITHOUT CONTRAST TECHNIQUE: Multidetector CT imaging of the abdomen and pelvis was performed following the standard protocol without IV contrast. COMPARISON:  02/17/2013 CT and prior studies FINDINGS: Please note that parenchymal abnormalities may be missed without intravenous contrast. Lower chest: No acute abnormality. Hepatobiliary: The liver and gallbladder are unremarkable. No biliary dilatation. Pancreas: Unremarkable Spleen: Unremarkable Adrenals/Urinary Tract: Mild bilateral hydroureteronephrosis noted to the bladder. A 4 cm filling defect within the bladder noted and may represent a mass. A small amount of gas in the bladder may be post catheterization or infection. The adrenal glands are unremarkable. Stomach/Bowel: Stomach is within normal limits. Appendix appears normal. No evidence of bowel wall thickening, distention, or inflammatory changes. Vascular/Lymphatic: Aortic atherosclerosis. No enlarged abdominal or pelvic lymph nodes. Reproductive: Patient is status post prostatectomy. Urinary sphincter hardware noted. Other: No ascites, focal collection or pneumoperitoneum. Musculoskeletal: No  acute or suspicious bony abnormalities. IMPRESSION: 1. 4 cm filling defect within the bladder which may represent mass/malignancy. Urology  consultation recommended. Mild bilateral hydroureteronephrosis. 2. Small amount of gas within the bladder-question post catheterization or infection. 3.  Aortic Atherosclerosis (ICD10-I70.0). Electronically Signed   By: Margarette Canada M.D.   On: 07/19/2018 17:33    Procedures Procedures (including critical care time)  Medications Ordered in ED Medications  sodium chloride 0.9 % bolus 1,000 mL (0 mLs Intravenous Stopped 07/19/18 1836)  morphine 4 MG/ML injection 4 mg (4 mg Intravenous Given 07/19/18 1543)  ondansetron (ZOFRAN) injection 4 mg (4 mg Intravenous Given 07/19/18 1543)  HYDROmorphone (DILAUDID) injection 1 mg (1 mg Intravenous Given 07/19/18 1626)  HYDROmorphone (DILAUDID) injection 1 mg (1 mg Intravenous Given 07/19/18 1853)  LORazepam (ATIVAN) injection 1 mg (1 mg Intravenous Given 07/19/18 1852)  clotrimazole (LOTRIMIN) 1 % cream ( Topical Given 07/19/18 1853)  metoprolol tartrate (LOPRESSOR) injection 5 mg (5 mg Intravenous Given 07/19/18 1957)     Initial Impression / Assessment and Plan / ED Course  I have reviewed the triage vital signs and the nursing notes.  Pertinent labs & imaging results that were available during my care of the patient were reviewed by me and considered in my medical decision making (see chart for details).   Bladder scan with 180 cc of urine.  After IVFs given, diaper is full of urine and no urine in bladder on repeat scan.  Pt is very anxious and depressed.    Pt does have a.fib.  He has not been anticoagulated due to hematuria.  HR responded to lopressor and is down to the 80s.  He is wearing a zio cardiac monitor.  Pt did have hematuria again while here.  No infection.  Urine sent for culture.  Etiology of lower abd pain unclear.  He is not retaining urine as his sphincter is off.  Labs are stable compared to recent labs at [place] has an appt with urology on 8/29.  He is encouraged to keep that app.  Final Clinical Impressions(s) / ED Diagnoses    Final diagnoses:  Status post implantation of artificial urinary sphincter  Intertrigo  Paroxysmal atrial fibrillation (HCC)  Anxiety  Gross hematuria  CKD (chronic kidney disease) stage 5, GFR less than 15 ml/min (HCC)  Lower abdominal pain    ED Discharge Orders         Ordered    clotrimazole (LOTRIMIN) 1 % cream     07/19/18 2027           Isla Pence, MD 07/19/18 2028

## 2018-07-20 ENCOUNTER — Encounter (HOSPITAL_COMMUNITY): Payer: Self-pay | Admitting: Emergency Medicine

## 2018-07-20 ENCOUNTER — Other Ambulatory Visit: Payer: Self-pay

## 2018-07-20 ENCOUNTER — Emergency Department (HOSPITAL_COMMUNITY)
Admission: EM | Admit: 2018-07-20 | Discharge: 2018-07-23 | Disposition: A | Discharge status: Skilled Nursing Facility | Payer: MEDICARE | Attending: Emergency Medicine | Admitting: Emergency Medicine

## 2018-07-20 ENCOUNTER — Encounter: Payer: Self-pay | Admitting: Internal Medicine

## 2018-07-20 ENCOUNTER — Encounter (HOSPITAL_COMMUNITY)
Admission: RE | Admit: 2018-07-20 | Discharge: 2018-07-20 | Disposition: A | Discharge status: Home / Self Care | Payer: MEDICARE | Source: Skilled Nursing Facility | Attending: *Deleted | Admitting: *Deleted

## 2018-07-20 ENCOUNTER — Non-Acute Institutional Stay (SKILLED_NURSING_FACILITY): Payer: MEDICARE | Admitting: Internal Medicine

## 2018-07-20 DIAGNOSIS — N184 Chronic kidney disease, stage 4 (severe): Secondary | ICD-10-CM | POA: Insufficient documentation

## 2018-07-20 DIAGNOSIS — D649 Anemia, unspecified: Secondary | ICD-10-CM | POA: Diagnosis not present

## 2018-07-20 DIAGNOSIS — R310 Gross hematuria: Secondary | ICD-10-CM

## 2018-07-20 DIAGNOSIS — I480 Paroxysmal atrial fibrillation: Secondary | ICD-10-CM | POA: Insufficient documentation

## 2018-07-20 DIAGNOSIS — R319 Hematuria, unspecified: Secondary | ICD-10-CM

## 2018-07-20 DIAGNOSIS — R531 Weakness: Secondary | ICD-10-CM | POA: Diagnosis not present

## 2018-07-20 DIAGNOSIS — I129 Hypertensive chronic kidney disease with stage 1 through stage 4 chronic kidney disease, or unspecified chronic kidney disease: Secondary | ICD-10-CM | POA: Diagnosis not present

## 2018-07-20 DIAGNOSIS — Z79899 Other long term (current) drug therapy: Secondary | ICD-10-CM | POA: Insufficient documentation

## 2018-07-20 DIAGNOSIS — N179 Acute kidney failure, unspecified: Secondary | ICD-10-CM | POA: Diagnosis not present

## 2018-07-20 DIAGNOSIS — Z87891 Personal history of nicotine dependence: Secondary | ICD-10-CM | POA: Insufficient documentation

## 2018-07-20 DIAGNOSIS — Z7982 Long term (current) use of aspirin: Secondary | ICD-10-CM | POA: Insufficient documentation

## 2018-07-20 LAB — CBC WITH DIFFERENTIAL/PLATELET
Basophils Absolute: 0.1 10*3/uL (ref 0.0–0.1)
Basophils Absolute: 0 10*3/uL (ref 0.0–0.1)
Basophils Relative: 1 %
Basophils Relative: 0 %
Eosinophils Absolute: 0.1 10*3/uL (ref 0.0–0.7)
Eosinophils Absolute: 0 10*3/uL (ref 0.0–0.7)
Eosinophils Relative: 1 %
Eosinophils Relative: 0 %
HCT: 27.6 % — ABNORMAL LOW (ref 39.0–52.0)
HCT: 24.4 % — ABNORMAL LOW (ref 39.0–52.0)
Hemoglobin: 8.7 g/dL — ABNORMAL LOW (ref 13.0–17.0)
Hemoglobin: 8 g/dL — ABNORMAL LOW (ref 13.0–17.0)
Lymphocytes Relative: 17 %
Lymphocytes Relative: 6 %
Lymphs Abs: 1.5 10*3/uL (ref 0.7–4.0)
Lymphs Abs: 0.5 10*3/uL — ABNORMAL LOW (ref 0.7–4.0)
MCH: 30 pg (ref 26.0–34.0)
MCH: 30.9 pg (ref 26.0–34.0)
MCHC: 31.5 g/dL (ref 30.0–36.0)
MCHC: 32.8 g/dL (ref 30.0–36.0)
MCV: 95.2 fL (ref 78.0–100.0)
MCV: 94.2 fL (ref 78.0–100.0)
Monocytes Absolute: 0.6 10*3/uL (ref 0.1–1.0)
Monocytes Absolute: 0.4 10*3/uL (ref 0.1–1.0)
Monocytes Relative: 7 %
Monocytes Relative: 5 %
Neutro Abs: 6.5 10*3/uL (ref 1.7–7.7)
Neutro Abs: 6.8 10*3/uL (ref 1.7–7.7)
Neutrophils Relative %: 74 %
Neutrophils Relative %: 89 %
Platelets: 354 10*3/uL (ref 150–400)
Platelets: 282 10*3/uL (ref 150–400)
RBC: 2.9 MIL/uL — ABNORMAL LOW (ref 4.22–5.81)
RBC: 2.59 MIL/uL — ABNORMAL LOW (ref 4.22–5.81)
RDW: 14 % (ref 11.5–15.5)
RDW: 14.1 % (ref 11.5–15.5)
WBC: 8.7 10*3/uL (ref 4.0–10.5)
WBC: 7.6 10*3/uL (ref 4.0–10.5)

## 2018-07-20 LAB — TROPONIN I: Troponin I: 0.03 ng/mL (ref <0.03)

## 2018-07-20 LAB — BASIC METABOLIC PANEL
Anion gap: 13 (ref 5–15)
Anion gap: 9 (ref 5–15)
BUN: 91 mg/dL — ABNORMAL HIGH (ref 8–23)
BUN: 95 mg/dL — ABNORMAL HIGH (ref 8–23)
CO2: 20 mmol/L — ABNORMAL LOW (ref 22–32)
CO2: 21 mmol/L — ABNORMAL LOW (ref 22–32)
Calcium: 8.9 mg/dL (ref 8.9–10.3)
Calcium: 8.5 mg/dL — ABNORMAL LOW (ref 8.9–10.3)
Chloride: 108 mmol/L (ref 98–111)
Chloride: 107 mmol/L (ref 98–111)
Creatinine, Ser: 4.06 mg/dL — ABNORMAL HIGH (ref 0.61–1.24)
Creatinine, Ser: 4.54 mg/dL — ABNORMAL HIGH (ref 0.61–1.24)
GFR calc Af Amer: 15 mL/min — ABNORMAL LOW (ref >60)
GFR calc Af Amer: 13 mL/min — ABNORMAL LOW (ref >60)
GFR calc non Af Amer: 13 mL/min — ABNORMAL LOW (ref >60)
GFR calc non Af Amer: 11 mL/min — ABNORMAL LOW (ref >60)
Glucose, Bld: 100 mg/dL — ABNORMAL HIGH (ref 70–99)
Glucose, Bld: 113 mg/dL — ABNORMAL HIGH (ref 70–99)
Potassium: 4.9 mmol/L (ref 3.5–5.1)
Potassium: 5.6 mmol/L — ABNORMAL HIGH (ref 3.5–5.1)
Sodium: 141 mmol/L (ref 135–145)
Sodium: 137 mmol/L (ref 135–145)

## 2018-07-20 LAB — LACTIC ACID, PLASMA
Lactic Acid, Venous: 1.5 mmol/L (ref 0.5–1.9)
Lactic Acid, Venous: 0.8 mmol/L (ref 0.5–1.9)

## 2018-07-20 MED ORDER — ROSUVASTATIN CALCIUM 10 MG PO TABS
10.0000 mg | ORAL_TABLET | Freq: Every day | ORAL | Status: DC
  Administered 2018-07-20 – 2018-07-22 (×3): 10 mg via ORAL
  Filled 2018-07-20 (×5): qty 1

## 2018-07-20 MED ORDER — OMEGA-3-ACID ETHYL ESTERS 1 G PO CAPS
1.0000 g | ORAL_CAPSULE | Freq: Two times a day (BID) | ORAL | Status: DC
  Administered 2018-07-21 – 2018-07-23 (×5): 1 g via ORAL
  Filled 2018-07-20 (×8): qty 1

## 2018-07-20 MED ORDER — ARIPIPRAZOLE 10 MG PO TABS
ORAL_TABLET | ORAL | Status: AC
  Filled 2018-07-20: qty 2

## 2018-07-20 MED ORDER — VITAMIN B-12 1000 MCG PO TABS
1000.0000 ug | ORAL_TABLET | Freq: Every day | ORAL | Status: DC
  Administered 2018-07-20 – 2018-07-23 (×4): 1000 ug via ORAL
  Filled 2018-07-20: qty 2
  Filled 2018-07-20: qty 1
  Filled 2018-07-20: qty 2
  Filled 2018-07-20: qty 1

## 2018-07-20 MED ORDER — SERTRALINE HCL 50 MG PO TABS
25.0000 mg | ORAL_TABLET | Freq: Every day | ORAL | Status: DC
  Administered 2018-07-20 – 2018-07-23 (×4): 25 mg via ORAL
  Filled 2018-07-20 (×4): qty 1

## 2018-07-20 MED ORDER — CIPROFLOXACIN HCL 250 MG PO TABS
500.0000 mg | ORAL_TABLET | Freq: Every day | ORAL | Status: DC
  Administered 2018-07-20 – 2018-07-23 (×4): 500 mg via ORAL
  Filled 2018-07-20 (×4): qty 2

## 2018-07-20 MED ORDER — METOPROLOL TARTRATE 25 MG PO TABS
12.5000 mg | ORAL_TABLET | Freq: Two times a day (BID) | ORAL | Status: DC
  Administered 2018-07-20 – 2018-07-23 (×6): 12.5 mg via ORAL
  Filled 2018-07-20 (×6): qty 1

## 2018-07-20 MED ORDER — ARIPIPRAZOLE 5 MG PO TABS
15.0000 mg | ORAL_TABLET | Freq: Every day | ORAL | Status: DC
  Administered 2018-07-20 – 2018-07-23 (×4): 15 mg via ORAL
  Filled 2018-07-20: qty 1
  Filled 2018-07-20: qty 3
  Filled 2018-07-20: qty 1
  Filled 2018-07-20: qty 3

## 2018-07-20 MED ORDER — PANTOPRAZOLE SODIUM 40 MG PO TBEC
40.0000 mg | DELAYED_RELEASE_TABLET | Freq: Every day | ORAL | Status: DC
  Administered 2018-07-20 – 2018-07-23 (×4): 40 mg via ORAL
  Filled 2018-07-20 (×4): qty 1

## 2018-07-20 MED ORDER — SODIUM CHLORIDE 0.9 % IV BOLUS
1000.0000 mL | Freq: Once | INTRAVENOUS | Status: AC
  Administered 2018-07-20: 1000 mL via INTRAVENOUS

## 2018-07-20 MED ORDER — ASPIRIN EC 81 MG PO TBEC
81.0000 mg | DELAYED_RELEASE_TABLET | Freq: Every day | ORAL | Status: DC
  Administered 2018-07-20 – 2018-07-23 (×4): 81 mg via ORAL
  Filled 2018-07-20 (×4): qty 1

## 2018-07-20 NOTE — ED Notes (Signed)
CRITICAL VALUE ALERT  Critical Value:  Trop 0.03  Date & Time Notied:  1502, 07/20/18  Provider Notified: Dr. Melina Copa  Orders Received/Actions taken: no new orders at this time.

## 2018-07-20 NOTE — ED Notes (Signed)
AC to bring holding meds.

## 2018-07-20 NOTE — Progress Notes (Signed)
Location:    [facility] Room Number: 136/P Place of Service:  SNF [phone]) Provider:  Terisa Starr, MD  Patient Care Team: [name], MD as PCP - General (Internal Medicine) [name], MD as Attending Physician (Cardiology)  Extended Emergency Contact Information Primary Emergency Contact: [name], [name] of [place] Phone: [phone] Relation: Son  Code Status:  Full Code Goals of care: Advanced Directive information Advanced Directives 07/20/2018  Does Patient Have a Medical Advance Directive? Yes  Type of Advance Directive (No Data)  Does patient want to make changes to medical advance directive? No - Patient declined  Would patient like information on creating a medical advance directive? No - Patient declined     Chief Complaint  Patient presents with  . Acute Visit    F/U from ED Visit, Patients c/o pain and hematuria    HPI:  Pt is a 84 y.o. male seen today for an acute visit for  Follow-up of ER visit yesterday secondary to significant suprapubic pain-and concerns for urinary retention.  Patient has a history of prostate cancer status post radical prostatectomyand adjunctive radiation with artificial urinary sphincter placement in 2012.  He also has a history of depression as well as hypertension.  He originally presented to [facility] emergency department for urinary retention hematuria and fevers.  He had extensive groin pain.  He also was hypotensive and tachycardic.   Attempt to deflate his sphincter device were unsuccessful in the ED and he was not able to urinate.  Urology at [facility] was consulted and recommended that he be evaluated in the emergency department at [facility].  Subsequently he was admitted there for sepsis with E. coli bacteremia and urinary tract infection as well as hematuria thought secondary to hemorrhagic  cystitis.  He was started on vancomycin and Zosyn empirically-renal ultrasound was negative for apparent nephrotic abscess and hydronephrosis.  Blood cultures grew out E. coli-antibiotics were de-escalated to Rocephin.  He was switched to Cipro on August 11 with end date August 20.  He did have a cystourethroscopy.  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  His artificial urinary sphincter has been deactivated.  With note saying to expect some leakage here  When he was admitted to skilled nursing he did have some hematuria-this was to be expected.  However yesterday apparently he developed some significant suprapubic pain and said he had not been able to urinate since yesterday morning and thus later in the day was sent to the ER for evaluation.  ER did do a bladder scan which showed 180 cc of urine but after IV fluids given diaper became full of urine and there was no urine on bladder on repeat scan.  His labs appeard to be fairly stable with previous values- BUN does appear to be gradually creeping up it was in the 60s on discharge from [facility]- now is in the 90s on today's lab -creatinine also has risen some although this is not a precipitous change with a creatinine of 4.06 on lab done today.  He does have a history of stage IV chronic renal failure.  His hemoglobin also appears to be gradually dropping it was 8.7 today had been in the mid nines over the weekend.  Today initially apparently he complained of some significant suprapubic pain but when I saw him said it had resolved.  Talking with therapy  he had a large amount of clots in his brief today and appeared to be increasingly weak.  Vital signs however appear to be stable  Currently he states he does have increased weakness not complaining of acute pain however continues to have significant hematuria-when I evaluated him his diaper just been changed- he appeared to have some bleeding from his  meatus  Nursing has spoken with his son who is concerned and would like him evaluated at [facility] if possible           .  Marland Kitchen       Review of systems.  Past Medical History:  Diagnosis Date  . 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   . Cancer ([place])    prostate ca 1999/removal/ rad tx  . Carotid artery occlusion   . Hyperlipidemia   . Hypertension   . Incontinence   . Reflux   . Renal disorder   . Status post implantation of artificial urinary sphincter    Past Surgical History:  Procedure Laterality Date  . cataracts    . HERNIA REPAIR    . radical prostectomy    . URINARY SPHINCTER IMPLANT      Allergies  Allergen Reactions  . Ivp Dye [Iodinated Diagnostic Agents]     Due to partial renal failure  . Nalbuphine Other (See Comments)    PATIENT STATES IT MADE HIM FEEL LIKE HE WAS GOING TO DIE. NO SPECIFICS  . Codeine Anxiety    Outpatient Encounter Medications as of 07/20/2018  Medication Sig  . acetaminophen (TYLENOL) 500 MG tablet Take 500 mg by mouth daily as needed for mild pain or moderate pain. For headache pain   . acetaminophen (TYLENOL) 500 MG tablet Take 1,000 mg by mouth daily as needed for moderate pain.  Marland Kitchen ALFALFA PO Take 250 mg by mouth daily.   . ARIPiprazole (ABILIFY) 15 MG tablet Take 15 mg by mouth daily.   Marland Kitchen aspirin EC 81 MG tablet Take 81 mg by mouth daily.    . ciprofloxacin (CIPRO) 500 MG tablet Take 500 mg by mouth daily. 4 day course starting on 07/17/2018  . fish oil-omega-3 fatty acids 1000 MG capsule Take 1 g by mouth 2 (two) times daily.   . Flaxseed, Linseed, (FLAX SEED OIL) 1000 MG CAPS Take 1 capsule by mouth daily.  . Methylcellulose, Laxative, (CITRUCEL) 500 MG TABS Take 1 tablet by mouth daily as needed (for constipation).   . metoprolol tartrate (LOPRESSOR) 25 MG tablet Take 12.5 mg by mouth 2 (two) times daily.  . Multiple Vitamin  (MULTIVITAMIN WITH MINERALS) TABS tablet Take 1 tablet by mouth daily.  . pantoprazole (PROTONIX) 40 MG tablet Take 40 mg by mouth daily.  . rosuvastatin (CRESTOR) 10 MG tablet Take 10 mg by mouth daily.   . sertraline (ZOLOFT) 25 MG tablet Take 25 mg by mouth daily.  . vitamin B-12 (CYANOCOBALAMIN) 1000 MCG tablet Take 1,000 mcg by mouth daily.   No facility-administered encounter medications on file as of 07/20/2018.     Review of Systems   In general is not complain of fever chills but says he feels really weak.  Skin does does have rashes in the perineal area Does not complain of diaphoresis   Ears eyes nose mouth and throat is not really complain of visual changes or sore throat.  Respiratory is not complaining cough or shortness of breath.  Cardiac  does not complain of chest pain does have some lower extremity edema.  GI is not complaining of abdominal pain currently or nausea or vomiting diarrhea or constipation.  GU previously had complained of some suprapubic pain but is not complaining of that currently.  Musculoskeletal does complain of diffuse weakness but does not really complain of joint pain.  Neurologic does not complain of feeling dizzy does complain of feeling weak possibly a bit dizzy when he is ambulating with therapy.  Psych does have a history of depression-possible parkinsonian  features-continues to be pleasant and cooperative  Immunization History  Administered Date(s) Administered  . Influenza-Unspecified 09/01/2013   Pertinent  Health Maintenance Due  Topic Date Due  . INFLUENZA VACCINE  08/20/2018 (Originally 07/02/2018)  . PNA vac Low Risk Adult (1 of 2 - PCV13) 08/20/2018 (Originally 09/30/2001)   Fall Risk  07/29/2016  Falls in the past year? No  Comment Emmi Telephone Survey: data to providers prior to load   Functional Status Survey:    Vitals:   07/20/18 1100  BP: 128/71  Pulse: 78  Resp: 18  Temp: 97.7 F (36.5 C)  TempSrc: Oral     Physical Exam In general this is a pleasant somewhat frail-appearing elderly male in no acute distress.  Skin is warm and dry he does have some hyperpigmentation diffuse.  He does have rashes in the perineal area  Eyes visual acuity appears to be intact sclera and conjunctive are clear he has prescription lenses.  Oropharynx is clear mucous membranes moist.  Chest is clear to auscultation with no labored breathing.  Heart is largely regular rate and rhythm without murmur gallop or rub he does have some moderate lower extremity edema bilaterally.  His abdomen today is soft does not really appear to be tender there are positive bowel sounds.  GU could not really appreciate suprapubic tenderness or distention he does have some bright red bleeding from his meatus with some blood in his diaper.  Musculoskeletal is able to stand without assistance and walk but he is quite weak --strength appears to be relatively intact all 4 extremities.  Neurologic as noted above he does have weakness but could not really appreciate lateralizing findings cranial nerves appear to be intact he does exhibit somewhat parkinsonian features.  Psych he is grossly alert and oriented pleasant appropriate Labs reviewed: Recent Labs    07/18/18 0830 07/19/18 1517 07/20/18 0700  NA 138 135 141  K 4.8 5.1 4.9  CL 106 106 108  CO2 21* 18* 20*  GLUCOSE 92 115* 100*  BUN 84* 89* 91*  CREATININE 3.58* 3.71* 4.06*  CALCIUM 8.8* 9.0 8.9   Recent Labs    07/19/18 1517  AST 42*  ALT 46*  ALKPHOS 108  BILITOT 0.7  PROT 7.5  ALBUMIN 3.6   Recent Labs    07/18/18 0830 07/19/18 1517 07/20/18 0700  WBC 7.8 10.7* 8.7  NEUTROABS 5.8 9.7* 6.5  HGB 9.5* 9.4* 8.7*  HCT 28.8* 29.0* 27.6*  MCV 93.5 92.9 95.2  PLT 264 314 354   No results found for: TSH No results found for: HGBA1C Lab Results  Component Value Date   CHOL 146 07/26/2008   HDL 30.3 (L) 07/26/2008   LDLCALC 82 07/26/2008   TRIG 171 (H)  07/26/2008   CHOLHDL 4.8 CALC 07/26/2008    Significant Diagnostic Results in last 30 days:  Ct Abdomen Pelvis Wo Contrast  Result Date: 07/19/2018 CLINICAL DATA:  84 year old male with acute abdominal pain  and UTI/sepsis. EXAM: CT ABDOMEN AND PELVIS WITHOUT CONTRAST TECHNIQUE: Multidetector CT imaging of the abdomen and pelvis was performed following the standard protocol without IV contrast. COMPARISON:  02/17/2013 CT and prior studies FINDINGS: Please note that parenchymal abnormalities may be missed without intravenous contrast. Lower chest: No acute abnormality. Hepatobiliary: The liver and gallbladder are unremarkable. No biliary dilatation. Pancreas: Unremarkable Spleen: Unremarkable Adrenals/Urinary Tract: Mild bilateral hydroureteronephrosis noted to the bladder. A 4 cm filling defect within the bladder noted and may represent a mass. A small amount of gas in the bladder may be post catheterization or infection. The adrenal glands are unremarkable. Stomach/Bowel: Stomach is within normal limits. Appendix appears normal. No evidence of bowel wall thickening, distention, or inflammatory changes. Vascular/Lymphatic: Aortic atherosclerosis. No enlarged abdominal or pelvic lymph nodes. Reproductive: Patient is status post prostatectomy. Urinary sphincter hardware noted. Other: No ascites, focal collection or pneumoperitoneum. Musculoskeletal: No acute or suspicious bony abnormalities. IMPRESSION: 1. 4 cm filling defect within the bladder which may represent mass/malignancy. Urology consultation recommended. Mild bilateral hydroureteronephrosis. 2. Small amount of gas within the bladder-question post catheterization or infection. 3.  Aortic Atherosclerosis (ICD10-I70.0). Electronically Signed   By: Margarette Canada M.D.   On: 07/19/2018 17:33    Assessment/Plan  #1- history of continued hematuria with history of sepsis and bacteremia- his artificial urinary sphincter again has been deactivated for  now.  He was actually seen in the ER yesterday for concerns of urinary retention but a bladder scan was reassuring and he did get IV fluids and had good output.  He continues to be very weak however and has significant bleeding this morning per discussion with therapy- hemoglobin has slowly dropped down to 8.7 and renal function shows that BUN is gradually rising.  He does not appear to be in any acute distress but family is concerned and he does have significant hematuria this morning- will send him to the ER for evaluation-nursing has spoken with [facility]- and indications are that he will need to go to the [facility] ER before he could be sent to [facility] will await ER evaluation-  #2 A. fib he does have a ZIO patch applied this appears to be rate controlled on Lopressor vital signs blood pressure pulse appear to be stable   [id number]-of note greater than 35 minutes spent assessing patient-reassessing patient-reviewing his chart and labs- discussing his status with nursing- and coordinating and formulating plan of care- of note greater than 50% of time spent coordinating plan of care with input as noted above

## 2018-07-20 NOTE — Progress Notes (Signed)
This encounter was created in error - please disregard.

## 2018-07-20 NOTE — ED Notes (Signed)
Patient incontinet of large amount of blood tinged urine. Patient cleaned, barrier cream applied to upper inner thighs and groin area. Bed linens changed. Pads placed underneath patient. Patient repositioned.

## 2018-07-20 NOTE — ED Triage Notes (Signed)
PT sent to ED today from [facility] due to continued hematuria with pain, lightheadedness, generalized weakness per skilled nursing staff. PT was seen in the ED yesterday. PT refused anything for pain today at the nursing home, pt's son requested pt be sent to the ED today for evaluation.

## 2018-07-20 NOTE — ED Notes (Signed)
Pt bladder scanned.   200 ml urine.

## 2018-07-20 NOTE — ED Provider Notes (Addendum)
[facility] EMERGENCY DEPARTMENT Provider Note   CSN: [id number] Arrival date & time: 07/20/18  1337     History   Chief Complaint Chief Complaint  Patient presents with  . Hematuria    HPI [name] [name] is a 84 y.o. male.  He was transferred here from [facility] due to hematuria lightheadedness generalized weakness.  Patient was admitted to [facility] last week for urosepsis and had a cystoscopy that showed some bladder friability.  He has an artificial urethral sphincter.  He was here yesterday for evaluation due to lack of urination despite letting down the sphincter.  He had 180 cc and eventually was discharged after he freely urinated here.  He has a urology appointment next week at [facility].  As best we can tell the urethral sphincter has not been inflated again and he has had no catheterization yesterday.  He is here for gross hematuria with clot.  Nursing staff also comments 1 lightheadedness and weakness but the patient denies these.  He is rather slow to answer and seems tired but he stated he had a hard day yesterday.  He denies any nausea vomiting abdominal pain.  No chest pain no shortness of breath.  The history is provided by the patient.  Hematuria  This is a recurrent problem. The problem has not changed since onset.Pertinent negatives include no chest pain, no abdominal pain, no headaches and no shortness of breath. Nothing aggravates the symptoms. Nothing relieves the symptoms. He has tried nothing for the symptoms. The treatment provided no relief.    Past Medical History:  Diagnosis Date  . 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   . Cancer ([place])    prostate ca 1999/removal/ rad tx  . Carotid artery occlusion   . Hyperlipidemia   . Hypertension   . Incontinence   . Reflux   . Renal disorder   . Status post implantation of artificial urinary sphincter     Patient Active Problem List   Diagnosis Date Noted  . Sepsis due to urinary tract infection ([place]) 07/19/2018  . Paroxysmal A-fib ([place]) 07/19/2018  . Chronic kidney disease, stage IV (severe) ([place]) 07/19/2018  . Hyperlipidemia 08/05/2014  . Knee effusion, right 04/21/2012  . Essential hypertension 07/25/2011  . CAROTID ARTERY DISEASE 06/14/2010  . CLOSED FRACTURE OF ACROMIAL END OF CLAVICLE 04/25/2010    Past Surgical History:  Procedure Laterality Date  . cataracts    . HERNIA REPAIR    . radical prostectomy    . URINARY SPHINCTER IMPLANT          Home Medications    Prior to Admission medications   Medication Sig Start Date End Date Taking? Authorizing Provider  acetaminophen (TYLENOL) 500 MG tablet Take 500 mg by mouth daily as needed for mild pain or moderate pain. For headache pain     [provider]  acetaminophen (TYLENOL) 500 MG tablet Take 1,000 mg by mouth daily as needed for moderate pain.    [provider]  ALFALFA PO Take 250 mg by mouth daily.     [provider]  ARIPiprazole (ABILIFY) 15 MG tablet Take 15 mg by mouth daily.     [provider]  aspirin EC 81 MG tablet Take 81 mg by mouth daily.      [provider]  ciprofloxacin (CIPRO) 500 MG tablet Take 500 mg by mouth daily. 4 day course starting  on 07/17/2018    [provider]  fish oil-omega-3 fatty acids 1000 MG capsule Take 1 g by mouth 2 (two) times daily.     [provider]  Flaxseed, Linseed, (FLAX SEED OIL) 1000 MG CAPS Take 1 capsule by mouth daily.    [provider]  Methylcellulose, Laxative, (CITRUCEL) 500 MG TABS Take 1 tablet by mouth daily as needed (for constipation).     [provider]  metoprolol tartrate (LOPRESSOR) 25 MG tablet Take 12.5 mg by mouth 2 (two) times daily.    [provider]  Multiple Vitamin (MULTIVITAMIN WITH MINERALS) TABS tablet Take 1 tablet by mouth daily.    [provider]  pantoprazole (PROTONIX)  40 MG tablet Take 40 mg by mouth daily.    [provider]  rosuvastatin (CRESTOR) 10 MG tablet Take 10 mg by mouth daily.     [provider]  sertraline (ZOLOFT) 25 MG tablet Take 25 mg by mouth daily.    [provider]  vitamin B-12 (CYANOCOBALAMIN) 1000 MCG tablet Take 1,000 mcg by mouth daily.    [provider]    Family History Family History  Problem Relation Age of Onset  . Coronary artery disease Unknown        family history, male < 84  . Arthritis Unknown        family history  . Cancer Unknown   . Kidney disease Unknown     Social History Social History   Tobacco Use  . Smoking status: Former Smoker    Packs/day: 2.00    Types: Cigarettes    Start date: 08/05/1962    Last attempt to quit: 12/02/1976    Years since quitting: 41.6  . Smokeless tobacco: Never Used  Substance Use Topics  . Alcohol use: No  . Drug use: No     Allergies   Ivp dye [iodinated diagnostic agents]; Nalbuphine; and Codeine   Review of Systems Review of Systems  Constitutional: Negative for fever.  HENT: Negative for sore throat.   Eyes: Negative for visual disturbance.  Respiratory: Negative for shortness of breath.   Cardiovascular: Negative for chest pain.  Gastrointestinal: Negative for abdominal pain.  Genitourinary: Positive for hematuria. Negative for dysuria.  Musculoskeletal: Negative for back pain.  Skin: Positive for rash.  Neurological: Negative for headaches.     Physical Exam Updated Vital Signs BP (!) 141/56 (BP Location: Right Arm)   Pulse 78   Temp 98 F (36.7 C) (Oral)   Resp 16   SpO2 100%   Physical Exam  Constitutional: He appears well-developed and well-nourished. No distress.  HENT:  Head: Normocephalic and atraumatic.  Eyes: Conjunctivae are normal.  Neck: Neck supple.  Cardiovascular: Normal rate, regular rhythm and normal heart sounds.  Pulmonary/Chest: Effort normal. No stridor. He has no wheezes.    Abdominal: Soft. He exhibits no mass. There is no tenderness. There is no guarding.  Genitourinary:  Genitourinary Comments: Uncircumcised penis.  Clot of blood at the meatus.  He is got some fungal erythema in his intertriginous areas.  Palpable plastic mass felt in the left upper scrotum.  Testes descended and nontender.  Musculoskeletal: He exhibits no tenderness or deformity.  Neurological: He is alert. He has normal strength. GCS eye subscore is 4. GCS verbal subscore is 5. GCS motor subscore is 6.  Skin: Skin is warm and dry.  Psychiatric: He has a normal mood and affect.  Nursing note and vitals reviewed.  ED Treatments / Results  Labs (all labs ordered are listed, but only abnormal results are displayed) Labs Reviewed  BASIC METABOLIC PANEL - Abnormal; Notable for the following components:      Result Value   Potassium 5.6 (*)    CO2 21 (*)    Glucose, Bld 113 (*)    BUN 95 (*)    Creatinine, Ser 4.54 (*)    Calcium 8.5 (*)    GFR calc non Af Amer 11 (*)    GFR calc Af Amer 13 (*)    All other components within normal limits  CBC WITH DIFFERENTIAL/PLATELET - Abnormal; Notable for the following components:   RBC 2.59 (*)    Hemoglobin 8.0 (*)    HCT 24.4 (*)    Lymphs Abs 0.5 (*)    All other components within normal limits  TROPONIN I - Abnormal; Notable for the following components:   Troponin I 0.03 (*)    All other components within normal limits  URINE CULTURE  LACTIC ACID, PLASMA  LACTIC ACID, PLASMA  URINALYSIS, ROUTINE W REFLEX MICROSCOPIC    EKG None  Radiology Ct Abdomen Pelvis Wo Contrast  Result Date: 07/19/2018 CLINICAL DATA:  84 year old male with acute abdominal pain and UTI/sepsis. EXAM: CT ABDOMEN AND PELVIS WITHOUT CONTRAST TECHNIQUE: Multidetector CT imaging of the abdomen and pelvis was performed following the standard protocol without IV contrast. COMPARISON:  02/17/2013 CT and prior studies FINDINGS: Please note that parenchymal  abnormalities may be missed without intravenous contrast. Lower chest: No acute abnormality. Hepatobiliary: The liver and gallbladder are unremarkable. No biliary dilatation. Pancreas: Unremarkable Spleen: Unremarkable Adrenals/Urinary Tract: Mild bilateral hydroureteronephrosis noted to the bladder. A 4 cm filling defect within the bladder noted and may represent a mass. A small amount of gas in the bladder may be post catheterization or infection. The adrenal glands are unremarkable. Stomach/Bowel: Stomach is within normal limits. Appendix appears normal. No evidence of bowel wall thickening, distention, or inflammatory changes. Vascular/Lymphatic: Aortic atherosclerosis. No enlarged abdominal or pelvic lymph nodes. Reproductive: Patient is status post prostatectomy. Urinary sphincter hardware noted. Other: No ascites, focal collection or pneumoperitoneum. Musculoskeletal: No acute or suspicious bony abnormalities. IMPRESSION: 1. 4 cm filling defect within the bladder which may represent mass/malignancy. Urology consultation recommended. Mild bilateral hydroureteronephrosis. 2. Small amount of gas within the bladder-question post catheterization or infection. 3.  Aortic Atherosclerosis (ICD10-I70.0). Electronically Signed   By: Margarette Canada M.D.   On: 07/19/2018 17:33    Procedures Procedures (including critical care time)  Medications Ordered in ED Medications  ARIPiprazole (ABILIFY) tablet 15 mg (has no administration in time range)  aspirin EC tablet 81 mg (81 mg Oral Given 07/20/18 1816)  ciprofloxacin (CIPRO) tablet 500 mg (500 mg Oral Given 07/20/18 1814)  metoprolol tartrate (LOPRESSOR) tablet 12.5 mg (has no administration in time range)  pantoprazole (PROTONIX) EC tablet 40 mg (40 mg Oral Given 07/20/18 1815)  rosuvastatin (CRESTOR) tablet 10 mg (has no administration in time range)  sertraline (ZOLOFT) tablet 25 mg (25 mg Oral Given 07/20/18 1815)  vitamin B-12 (CYANOCOBALAMIN) tablet 1,000 mcg  (has no administration in time range)  fish oil-omega-3 fatty acids capsule 1 g (has no administration in time range)  sodium chloride 0.9 % bolus 1,000 mL (0 mLs Intravenous Stopped 07/20/18 1550)     Initial Impression / Assessment and Plan / ED Course  I have reviewed the triage vital signs and the nursing notes.  Pertinent labs & imaging results that were  available during my care of the patient were reviewed by me and considered in my medical decision making (see chart for details).  Clinical Course as of Jul 24 1514  Mon Jul 20, 2018  1413 Currently 205 cc by bladder scan.   [MB]  1413 There is a note that he went over to [facility] today and they had some lab work showed his hemoglobin trending down a little.  They comment that the family wanted him to be transferred to [facility] but they ultimately ended up sending him here with the idea that he may need to be transferred to [facility] from here.   [MB]  0109  ECG here with poor baseline likely sinus 83 no acute ST-T changes.   [MB]  1510 Patient's lab coming back and he is got a slightly elevated potassium is slightly worsened creatinine.  He is also dropped his hemoglobin over this last week.  It sounds like the plan was that if he needed to be admitted to the family would like him back to [facility] so I am placing a call into them as that is where his urologist is from and where he was recently admitted for his urosepsis.   [MB]  1603 Discussed with urology Dr. Monica Martinez who does feel the patient needs to be transferred but there are no open beds at [facility].  He recommends the patient be transferred ED to ED and upon arrival there they should page the urology resident from Dr. Eveline Keto.  He recommends no attempt at catheterization of the patient without discussion with urology first.   [MB]  1604 Prior to contacting [facility] I did review this with the patient and he is in agreement that he would want to go to [facility] if needed to be  admitted.   [MB]  3235 Discussed with Melissa at the acceptance office at [facility].  She is going to get back to me with a possible accepting physician.   [MB]  1750 Patient is being accepted by Dr. Katharine Look at [facility].  They will contact us when bed available.   [MB]  5732 Contacted the patient's son Jayleen Afonso phone [phone] who agrees with plan for transfer to [facility] when room available.  He lives in [place].    [MB]  Thu Jul 23, 2018  3762 At this time letter volume calculated by bladder scan, and bedside ultrasound, approximately 150 cc.  Patient states he voided a small amount at around 8 AM this morning.  Currently he is complaining of "bladder spasms."   [EW]  1015 Quantity of input and output, has not been measured for the last 24 hours.  Per report from nursing staff patient has been able to eat and drink, and went to the bathroom on his own several times yesterday.   [EW]  1016 Current assessment indicates that the patient's hemoglobin has dropped significantly over the last 3 weeks, more abruptly over the last week, and is currently just over 7.  Therefore the patient will be given transfusion of 1 unit packed red cells, anticipating that he can be discharged for outpatient management after that.  I will touch base with the urology service, at [facility] prior to discharging patient.  It appears that the patient's creatinine is stable, but elevated, with mild elevation of potassium.  This will require evaluation and possible intervention, by nephrology.   [EW]  8315 I discussed the case with Dr. Oleta Mouse, [facility] urology service.  He is comfortable with  the patient being discharged, after transfusion, to follow-up with Dr. Odis Luster, as scheduled on 07/30/18.   [EW]  1121 Case discussed with Granville Lewis, NP, regarding disposition.  He will accept patient to come back to their facility, a skilled nursing facility, where he will be managed.  Her last and understands that the patient  will require nephrology follow-up and close monitoring of creatinine and potassium.  Elevated potassium can be treated with Kayexalate, PRN.   [EW]    Clinical Course User Index [EW] Daleen Bo, MD [MB] Hayden Rasmussen, MD     Final Clinical Impressions(s) / ED Diagnoses   Final diagnoses:  Gross hematuria  Acute renal failure, unspecified acute renal failure type ([place])  Anemia, unspecified type    ED Discharge Orders    None       Hayden Rasmussen, MD 07/20/18 2132    Hayden Rasmussen, MD 07/24/18 1515

## 2018-07-21 ENCOUNTER — Encounter (HOSPITAL_COMMUNITY): Payer: Self-pay | Admitting: Emergency Medicine

## 2018-07-21 LAB — CBC
HCT: 22.8 % — ABNORMAL LOW (ref 39.0–52.0)
Hemoglobin: 7.5 g/dL — ABNORMAL LOW (ref 13.0–17.0)
MCH: 30.9 pg (ref 26.0–34.0)
MCHC: 32.9 g/dL (ref 30.0–36.0)
MCV: 93.8 fL (ref 78.0–100.0)
Platelets: 246 10*3/uL (ref 150–400)
RBC: 2.43 MIL/uL — ABNORMAL LOW (ref 4.22–5.81)
RDW: 14 % (ref 11.5–15.5)
WBC: 5.6 10*3/uL (ref 4.0–10.5)

## 2018-07-21 LAB — COMPREHENSIVE METABOLIC PANEL
ALT: 27 U/L (ref 0–44)
AST: 30 U/L (ref 15–41)
Albumin: 2.9 g/dL — ABNORMAL LOW (ref 3.5–5.0)
Alkaline Phosphatase: 82 U/L (ref 38–126)
Anion gap: 7 (ref 5–15)
BUN: 74 mg/dL — ABNORMAL HIGH (ref 8–23)
CO2: 20 mmol/L — ABNORMAL LOW (ref 22–32)
Calcium: 8.3 mg/dL — ABNORMAL LOW (ref 8.9–10.3)
Chloride: 112 mmol/L — ABNORMAL HIGH (ref 98–111)
Creatinine, Ser: 3.62 mg/dL — ABNORMAL HIGH (ref 0.61–1.24)
GFR calc Af Amer: 17 mL/min — ABNORMAL LOW (ref >60)
GFR calc non Af Amer: 14 mL/min — ABNORMAL LOW (ref >60)
Glucose, Bld: 108 mg/dL — ABNORMAL HIGH (ref 70–99)
Potassium: 5.3 mmol/L — ABNORMAL HIGH (ref 3.5–5.1)
Sodium: 139 mmol/L (ref 135–145)
Total Bilirubin: 0.5 mg/dL (ref 0.3–1.2)
Total Protein: 6 g/dL — ABNORMAL LOW (ref 6.5–8.1)

## 2018-07-21 LAB — URINE CULTURE: Culture: 30000 — AB

## 2018-07-21 MED ORDER — MORPHINE SULFATE (PF) 2 MG/ML IV SOLN
2.0000 mg | INTRAVENOUS | Status: DC | PRN
  Administered 2018-07-21 – 2018-07-23 (×5): 2 mg via INTRAVENOUS
  Filled 2018-07-21 (×5): qty 1

## 2018-07-21 MED ORDER — OXYBUTYNIN CHLORIDE 5 MG PO TABS
5.0000 mg | ORAL_TABLET | Freq: Three times a day (TID) | ORAL | Status: DC | PRN
  Administered 2018-07-21 – 2018-07-23 (×4): 5 mg via ORAL
  Filled 2018-07-21 (×8): qty 1

## 2018-07-21 MED ORDER — HYDROMORPHONE HCL 1 MG/ML IJ SOLN
0.5000 mg | Freq: Once | INTRAMUSCULAR | Status: AC
  Administered 2018-07-21: 0.5 mg via INTRAVENOUS
  Filled 2018-07-21: qty 1

## 2018-07-21 NOTE — ED Provider Notes (Signed)
Patient is awaiting transfer to [facility].  In brief, he had a recent urologic procedure and is been determined he is best managed back at [facility].  Unfortunately no immediate beds at this time.  At this time they lack capacity to accept transfer, but patient will await in this ED prior to transfer.  Patient will need to be reassessed later today for transfer.  He may need to be transferred from ER to ER He has been sleeping and in no distress.  He is able to pass urine.   Ripley Fraise, MD 07/21/18 [phone]

## 2018-07-21 NOTE — ED Notes (Signed)
Pt placed on hospital bed

## 2018-07-21 NOTE — ED Provider Notes (Signed)
Patient's chart reviewed.  Patient holding for bed at [facility].  Urologic procedure has an artificial sphincter.  Unable to be catheterized.  Per nursing, significant pain related to urinary retention.  He is able to void some but is not fully emptying his bladder.  Last bladder scan greater than 400.  Hemoglobin is downtrending.  Last hemoglobin 7.5 at today.  He has a documented history of coronary artery occlusion.  Would suspect to be close to transfusion threshold especially given his lightheadedness.  Vital signs have remained stable however.  Will repeat hemoglobin first thing in the morning.  I have requested nursing touch base with [facility] regarding possible ED to ED transfer given that we cannot alleviate this patient's needs.  Vital signs reviewed and only notable for blood pressure 175/70.   Merryl Hacker, MD 07/21/18 [phone]

## 2018-07-21 NOTE — ED Notes (Signed)
Pt up to bedside toilet with 1 person assist

## 2018-07-21 NOTE — ED Notes (Signed)
Pt states he does not want his food tray.   Pt request crackers and peanut butter,  Given to pt

## 2018-07-21 NOTE — ED Notes (Signed)
[facility] Transfer Nurse called to give an update on Pt.   "They are at an Extremely High Capacity at this time and will continue to give an update at least every 4 hours about bed status", also stated. "it may be evening or even tomorrow before a bed is available".  Pts nurse and CN informed.

## 2018-07-22 ENCOUNTER — Telehealth: Payer: Self-pay | Admitting: *Deleted

## 2018-07-22 ENCOUNTER — Encounter: Payer: Self-pay | Admitting: Internal Medicine

## 2018-07-22 LAB — CBC WITH DIFFERENTIAL/PLATELET
Basophils Absolute: 0 10*3/uL (ref 0.0–0.1)
Basophils Relative: 0 %
Eosinophils Absolute: 0 10*3/uL (ref 0.0–0.7)
Eosinophils Relative: 0 %
HCT: 24.3 % — ABNORMAL LOW (ref 39.0–52.0)
Hemoglobin: 8.1 g/dL — ABNORMAL LOW (ref 13.0–17.0)
Lymphocytes Relative: 6 %
Lymphs Abs: 0.5 10*3/uL — ABNORMAL LOW (ref 0.7–4.0)
MCH: 31 pg (ref 26.0–34.0)
MCHC: 33.3 g/dL (ref 30.0–36.0)
MCV: 93.1 fL (ref 78.0–100.0)
Monocytes Absolute: 0.5 10*3/uL (ref 0.1–1.0)
Monocytes Relative: 6 %
Neutro Abs: 8.5 10*3/uL — ABNORMAL HIGH (ref 1.7–7.7)
Neutrophils Relative %: 88 %
Platelets: 292 10*3/uL (ref 150–400)
RBC: 2.61 MIL/uL — ABNORMAL LOW (ref 4.22–5.81)
RDW: 13.9 % (ref 11.5–15.5)
WBC: 9.6 10*3/uL (ref 4.0–10.5)

## 2018-07-22 LAB — BASIC METABOLIC PANEL
Anion gap: 11 (ref 5–15)
BUN: 87 mg/dL — ABNORMAL HIGH (ref 8–23)
CO2: 16 mmol/L — ABNORMAL LOW (ref 22–32)
Calcium: 8.3 mg/dL — ABNORMAL LOW (ref 8.9–10.3)
Chloride: 108 mmol/L (ref 98–111)
Creatinine, Ser: 5.11 mg/dL — ABNORMAL HIGH (ref 0.61–1.24)
GFR calc Af Amer: 11 mL/min — ABNORMAL LOW (ref >60)
GFR calc non Af Amer: 10 mL/min — ABNORMAL LOW (ref >60)
Glucose, Bld: 113 mg/dL — ABNORMAL HIGH (ref 70–99)
Potassium: 6 mmol/L — ABNORMAL HIGH (ref 3.5–5.1)
Sodium: 135 mmol/L (ref 135–145)

## 2018-07-22 MED ORDER — ZOLPIDEM TARTRATE 5 MG PO TABS
5.0000 mg | ORAL_TABLET | Freq: Once | ORAL | Status: AC
  Administered 2018-07-22: 5 mg via ORAL
  Filled 2018-07-22: qty 1

## 2018-07-22 NOTE — Telephone Encounter (Signed)
Post ED Visit - Positive Culture Follow-up  Culture report reviewed by antimicrobial stewardship pharmacist:  []  Elenor Quinones, Pharm.D. []  Heide Guile, Pharm.D., BCPS AQ-ID []  Parks Neptune, Pharm.D., BCPS []  Alycia Rossetti, Pharm.D., BCPS []  [place], Pharm.D., BCPS, AAHIVP []  Legrand Como, Pharm.D., BCPS, AAHIVP [x]  Salome Arnt, PharmD, BCPS []  Johnnette Gourd, PharmD, BCPS []  Hughes Better, PharmD, BCPS []  Leeroy Cha, PharmD  Positive urine culture/yeast No further patient follow-up is required at this time.  Harlon Flor [facility] 07/22/2018, 11:31 AM

## 2018-07-22 NOTE — ED Notes (Signed)
Cathy at [facility] called to say, "Pt is still on their wait list".  Wells Guiles informed.

## 2018-07-22 NOTE — ED Notes (Signed)
Pts son called to check on his dad and to see if we had an idea about transfer.  Informed him that we were told that we should hopefully hear something this am.  He spoke with his dad on the phone.  Pt given breakfast tray.

## 2018-07-22 NOTE — ED Notes (Signed)
Called [facility] to get update on  bed for Pt at [facility].  Per Caren Griffins at [facility] Pt is still on the list and she doesn:t know how long it will be before a bed is available".  Nurse informed

## 2018-07-23 ENCOUNTER — Encounter (HOSPITAL_COMMUNITY): Payer: Self-pay

## 2018-07-23 ENCOUNTER — Inpatient Hospital Stay
Admission: RE | Admit: 2018-07-23 | Discharge: 2018-07-24 | Disposition: A | Discharge status: Other Acute Inpatient Hospital | Payer: MEDICARE | Source: Ambulatory Visit | Attending: Internal Medicine | Admitting: Internal Medicine

## 2018-07-23 DIAGNOSIS — E872 Acidosis: Secondary | ICD-10-CM | POA: Diagnosis not present

## 2018-07-23 DIAGNOSIS — N390 Urinary tract infection, site not specified: Secondary | ICD-10-CM | POA: Diagnosis not present

## 2018-07-23 DIAGNOSIS — N393 Stress incontinence (female) (male): Secondary | ICD-10-CM | POA: Diagnosis present

## 2018-07-23 DIAGNOSIS — Z87891 Personal history of nicotine dependence: Secondary | ICD-10-CM | POA: Diagnosis not present

## 2018-07-23 DIAGNOSIS — Z9689 Presence of other specified functional implants: Secondary | ICD-10-CM | POA: Diagnosis not present

## 2018-07-23 DIAGNOSIS — D631 Anemia in chronic kidney disease: Secondary | ICD-10-CM | POA: Diagnosis not present

## 2018-07-23 DIAGNOSIS — E785 Hyperlipidemia, unspecified: Secondary | ICD-10-CM | POA: Diagnosis not present

## 2018-07-23 DIAGNOSIS — F329 Major depressive disorder, single episode, unspecified: Secondary | ICD-10-CM | POA: Diagnosis not present

## 2018-07-23 DIAGNOSIS — Z9079 Acquired absence of other genital organ(s): Secondary | ICD-10-CM | POA: Diagnosis not present

## 2018-07-23 DIAGNOSIS — N1330 Unspecified hydronephrosis: Secondary | ICD-10-CM | POA: Diagnosis present

## 2018-07-23 DIAGNOSIS — N3041 Irradiation cystitis with hematuria: Secondary | ICD-10-CM | POA: Diagnosis present

## 2018-07-23 DIAGNOSIS — N138 Other obstructive and reflux uropathy: Secondary | ICD-10-CM | POA: Diagnosis present

## 2018-07-23 DIAGNOSIS — Z79899 Other long term (current) drug therapy: Secondary | ICD-10-CM | POA: Diagnosis not present

## 2018-07-23 DIAGNOSIS — Z8546 Personal history of malignant neoplasm of prostate: Secondary | ICD-10-CM | POA: Diagnosis not present

## 2018-07-23 DIAGNOSIS — N3040 Irradiation cystitis without hematuria: Secondary | ICD-10-CM | POA: Diagnosis not present

## 2018-07-23 DIAGNOSIS — N184 Chronic kidney disease, stage 4 (severe): Secondary | ICD-10-CM | POA: Diagnosis not present

## 2018-07-23 DIAGNOSIS — R7881 Bacteremia: Secondary | ICD-10-CM | POA: Diagnosis not present

## 2018-07-23 DIAGNOSIS — R799 Abnormal finding of blood chemistry, unspecified: Secondary | ICD-10-CM | POA: Diagnosis present

## 2018-07-23 DIAGNOSIS — R6520 Severe sepsis without septic shock: Secondary | ICD-10-CM | POA: Diagnosis not present

## 2018-07-23 DIAGNOSIS — G20 Parkinson's disease: Secondary | ICD-10-CM | POA: Diagnosis present

## 2018-07-23 DIAGNOSIS — A4151 Sepsis due to Escherichia coli [E. coli]: Secondary | ICD-10-CM | POA: Diagnosis not present

## 2018-07-23 DIAGNOSIS — Z66 Do not resuscitate: Secondary | ICD-10-CM | POA: Diagnosis present

## 2018-07-23 DIAGNOSIS — N179 Acute kidney failure, unspecified: Secondary | ICD-10-CM | POA: Diagnosis not present

## 2018-07-23 DIAGNOSIS — I480 Paroxysmal atrial fibrillation: Secondary | ICD-10-CM | POA: Diagnosis not present

## 2018-07-23 DIAGNOSIS — R34 Anuria and oliguria: Secondary | ICD-10-CM | POA: Diagnosis not present

## 2018-07-23 DIAGNOSIS — K219 Gastro-esophageal reflux disease without esophagitis: Secondary | ICD-10-CM | POA: Diagnosis not present

## 2018-07-23 DIAGNOSIS — N320 Bladder-neck obstruction: Secondary | ICD-10-CM | POA: Diagnosis present

## 2018-07-23 DIAGNOSIS — I129 Hypertensive chronic kidney disease with stage 1 through stage 4 chronic kidney disease, or unspecified chronic kidney disease: Secondary | ICD-10-CM | POA: Diagnosis not present

## 2018-07-23 DIAGNOSIS — I2510 Atherosclerotic heart disease of native coronary artery without angina pectoris: Secondary | ICD-10-CM | POA: Diagnosis present

## 2018-07-23 DIAGNOSIS — R262 Difficulty in walking, not elsewhere classified: Secondary | ICD-10-CM | POA: Diagnosis not present

## 2018-07-23 DIAGNOSIS — N3289 Other specified disorders of bladder: Secondary | ICD-10-CM | POA: Diagnosis not present

## 2018-07-23 DIAGNOSIS — D649 Anemia, unspecified: Secondary | ICD-10-CM | POA: Diagnosis not present

## 2018-07-23 DIAGNOSIS — R310 Gross hematuria: Secondary | ICD-10-CM | POA: Diagnosis not present

## 2018-07-23 DIAGNOSIS — D696 Thrombocytopenia, unspecified: Secondary | ICD-10-CM | POA: Diagnosis not present

## 2018-07-23 DIAGNOSIS — R339 Retention of urine, unspecified: Secondary | ICD-10-CM | POA: Diagnosis not present

## 2018-07-23 DIAGNOSIS — Z7982 Long term (current) use of aspirin: Secondary | ICD-10-CM | POA: Diagnosis not present

## 2018-07-23 DIAGNOSIS — M545 Low back pain: Secondary | ICD-10-CM | POA: Diagnosis not present

## 2018-07-23 DIAGNOSIS — R319 Hematuria, unspecified: Secondary | ICD-10-CM | POA: Diagnosis not present

## 2018-07-23 DIAGNOSIS — N289 Disorder of kidney and ureter, unspecified: Secondary | ICD-10-CM | POA: Diagnosis not present

## 2018-07-23 DIAGNOSIS — E875 Hyperkalemia: Secondary | ICD-10-CM | POA: Diagnosis present

## 2018-07-23 LAB — BASIC METABOLIC PANEL
Anion gap: 11 (ref 5–15)
BUN: 90 mg/dL — ABNORMAL HIGH (ref 8–23)
CO2: 17 mmol/L — ABNORMAL LOW (ref 22–32)
Calcium: 8.6 mg/dL — ABNORMAL LOW (ref 8.9–10.3)
Chloride: 108 mmol/L (ref 98–111)
Creatinine, Ser: 5.7 mg/dL — ABNORMAL HIGH (ref 0.61–1.24)
GFR calc Af Amer: 10 mL/min — ABNORMAL LOW (ref >60)
GFR calc non Af Amer: 8 mL/min — ABNORMAL LOW (ref >60)
Glucose, Bld: 88 mg/dL (ref 70–99)
Potassium: 5.5 mmol/L — ABNORMAL HIGH (ref 3.5–5.1)
Sodium: 136 mmol/L (ref 135–145)

## 2018-07-23 LAB — CBC WITH DIFFERENTIAL/PLATELET
Basophils Absolute: 0 10*3/uL (ref 0.0–0.1)
Basophils Relative: 1 %
Eosinophils Absolute: 0.1 10*3/uL (ref 0.0–0.7)
Eosinophils Relative: 1 %
HCT: 21.8 % — ABNORMAL LOW (ref 39.0–52.0)
Hemoglobin: 7.2 g/dL — ABNORMAL LOW (ref 13.0–17.0)
Lymphocytes Relative: 16 %
Lymphs Abs: 0.9 10*3/uL (ref 0.7–4.0)
MCH: 30.9 pg (ref 26.0–34.0)
MCHC: 33 g/dL (ref 30.0–36.0)
MCV: 93.6 fL (ref 78.0–100.0)
Monocytes Absolute: 0.4 10*3/uL (ref 0.1–1.0)
Monocytes Relative: 8 %
Neutro Abs: 4.3 10*3/uL (ref 1.7–7.7)
Neutrophils Relative %: 74 %
Platelets: 251 10*3/uL (ref 150–400)
RBC: 2.33 MIL/uL — ABNORMAL LOW (ref 4.22–5.81)
RDW: 14 % (ref 11.5–15.5)
WBC: 5.7 10*3/uL (ref 4.0–10.5)

## 2018-07-23 LAB — PREPARE RBC (CROSSMATCH)

## 2018-07-23 LAB — ABO/RH: ABO/RH(D): O POS

## 2018-07-23 MED ORDER — OXYCODONE HCL 5 MG PO TABS: 5.0000 mg | ORAL_TABLET | Freq: Three times a day (TID) | ORAL | 0 refills | Status: DC | PRN

## 2018-07-23 MED ORDER — SODIUM CHLORIDE 0.9% IV SOLUTION: Freq: Once | INTRAVENOUS | Status: DC

## 2018-07-23 NOTE — ED Notes (Signed)
[facility] for Dr. Mordecai Maes to consult with Granville Lewis, PAC earlier.

## 2018-07-23 NOTE — ED Provider Notes (Signed)
9:31 AM-patient remains on the transfer list, to [facility], facility in [place].  Here he is being evaluated for urinary retention with hematuria.  Creatinine climbing yesterday was not intervened with.  Apparent ongoing urinary retention secondary to hematuria.  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  Therefore it is unlikely to be causing the urinary retention at this time.  Unfortunately the presence of the artificial urinary sphincter, prevents Foley catheterization.  Repeat labs ordered for this morning, by me.  Clinical Course as of Jul 23 1652  Mon Jul 20, 2018  1413 Currently 205 cc by bladder scan.   [MB]  1413 There is a note that he went over to [facility] today and they had some lab work showed his hemoglobin trending down a little.  They comment that the family wanted him to be transferred to [facility] but they ultimately ended up sending him here with the idea that he may need to be transferred to [facility] from here.   [MB]  1245  ECG here with poor baseline likely sinus 83 no acute ST-T changes.   [MB]  1510 Patient's lab coming back and he is got a slightly elevated potassium is slightly worsened creatinine.  He is also dropped his hemoglobin over this last week.  It sounds like the plan was that if he needed to be admitted to the family would like him back to [facility] so I am placing a call into them as that is where his urologist is from and where he was recently admitted for his urosepsis.   [MB]  1603 Discussed with urology Dr. Monica Martinez who does feel the patient needs to be transferred but there are no open beds at [facility].  He recommends the patient be transferred ED to ED and upon arrival there they should page the urology resident from Dr. Eveline Keto.  He recommends no attempt at catheterization of the patient without discussion with urology first.   [MB]  1604 Prior to contacting [facility] I did review this  with the patient and he is in agreement that he would want to go to [facility] if needed to be admitted.   [MB]  8099 Discussed with Melissa at the acceptance office at [facility].  She is going to get back to me with a possible accepting physician.   [MB]  1750 Patient is being accepted by Dr. Katharine Look at [facility].  They will contact us when bed available.   [MB]  8338 Contacted the patient's son Ranger Petrich phone [phone] who agrees with plan for transfer to [facility] when room available.  He lives in [place].    [MB]  Thu Jul 23, 2018  4193 At this time letter volume calculated by bladder scan, and bedside ultrasound, approximately 150 cc.  Patient states he voided a small amount at around 8 AM this morning.  Currently he is complaining of "bladder spasms."   [EW]  1015 Quantity of input and output, has not been measured for the last 24 hours.  Per report from nursing staff patient has been able to eat and drink, and went to the bathroom on his own several times yesterday.   [EW]  1016 Current assessment indicates that the patient's hemoglobin has dropped significantly over the last 3 weeks, more abruptly over the last week, and is currently just over 7.  Therefore the patient will be given transfusion of 1 unit packed red cells, anticipating that he can be discharged  for outpatient management after that.  I will touch base with the urology service, at [facility] prior to discharging patient.  It appears that the patient's creatinine is stable, but elevated, with mild elevation of potassium.  This will require evaluation and possible intervention, by nephrology.   [EW]  7517 I discussed the case with Dr. Oleta Mouse, [facility] urology service.  He is comfortable with the patient being discharged, after transfusion, to follow-up with Dr. Odis Luster, as scheduled on 07/30/18.   [EW]  1121 Case discussed with Granville Lewis, NP, regarding disposition.  He will accept patient to come back to their facility, a  skilled nursing facility, where he will be managed.  Her last and understands that the patient will require nephrology follow-up and close monitoring of creatinine and potassium.  Elevated potassium can be treated with Kayexalate, PRN.   [EW]    Clinical Course User Index [EW] Daleen Bo, MD [MB] Hayden Rasmussen, MD     Patient Vitals for the past 24 hrs:  BP Temp Temp src Pulse Resp SpO2  07/23/18 1530 (!) 193/133 - - (!) 101 (!) 23 99 %  07/23/18 1306 - 97.6 F (36.4 C) Oral - - -  07/23/18 1247 (!) 193/74 97.7 F (36.5 C) Oral 99 (!) 28 99 %  07/23/18 1030 (!) 176/80 - - 86 (!) 28 98 %  07/23/18 1000 (!) 182/70 - - 92 13 97 %  07/23/18 0936 98/79 - - 94 16 99 %  07/23/18 0915 (!) 159/66 - - (!) 58 15 98 %  07/23/18 0900 (!) 160/61 - - 88 10 98 %  07/23/18 0830 (!) 149/56 - - 86 14 99 %  07/23/18 0800 (!) 114/48 - - 66 13 99 %  07/23/18 0730 (!) 117/53 - - - 13 -  07/23/18 0700 114/67 - - 62 13 100 %  07/23/18 0630 (!) 103/47 - - - 13 -  07/23/18 0615 - - - - 12 -  07/23/18 0600 (!) 109/44 - - (!) 59 13 97 %  07/23/18 0545 - - - 61 14 97 %  07/23/18 0530 (!) 104/50 - - 62 11 95 %  07/23/18 0000 (!) 122/58 - - - 12 -  07/22/18 2345 - - - 69 13 99 %  07/22/18 2330 (!) 132/51 - - 86 17 100 %  07/22/18 2315 - - - - 12 -  07/22/18 2300 120/64 - - - 13 -  07/22/18 2230 (!) 121/48 - - 61 13 97 %  07/22/18 2215 - - - 64 14 97 %  07/22/18 2200 136/61 - - 73 18 99 %  07/22/18 2131 - - - 90 - -  07/22/18 2100 (!) 130/50 - - 64 13 99 %  07/22/18 2045 - - - 69 11 98 %  07/22/18 2030 (!) 133/45 - - 67 13 100 %  07/22/18 2015 - - - 64 18 96 %  07/22/18 2000 (!) 128/53 - - 64 13 99 %  07/22/18 1945 - - - 67 16 100 %  07/22/18 1930 (!) 142/59 - - 66 12 98 %  07/22/18 1730 (!) 144/59 - - 67 16 97 %  07/22/18 1700 (!) 133/54 - - 68 13 99 %    Results for orders placed or performed during the hospital encounter of 00/17/49  Basic metabolic panel  Result Value Ref Range    Sodium 137 135 - 145 mmol/L   Potassium 5.6 (H) 3.5 - 5.1 mmol/L  Chloride 107 98 - 111 mmol/L   CO2 21 (L) 22 - 32 mmol/L   Glucose, Bld 113 (H) 70 - 99 mg/dL   BUN 95 (H) 8 - 23 mg/dL   Creatinine, Ser 4.54 (H) 0.61 - 1.24 mg/dL   Calcium 8.5 (L) 8.9 - 10.3 mg/dL   GFR calc non Af Amer 11 (L) >60 mL/min   GFR calc Af Amer 13 (L) >60 mL/min   Anion gap 9 5 - 15  CBC with Differential  Result Value Ref Range   WBC 7.6 4.0 - 10.5 K/uL   RBC 2.59 (L) 4.22 - 5.81 MIL/uL   Hemoglobin 8.0 (L) 13.0 - 17.0 g/dL   HCT 24.4 (L) 39.0 - 52.0 %   MCV 94.2 78.0 - 100.0 fL   MCH 30.9 26.0 - 34.0 pg   MCHC 32.8 30.0 - 36.0 g/dL   RDW 14.1 11.5 - 15.5 %   Platelets 282 150 - 400 K/uL   Neutrophils Relative % 89 %   Neutro Abs 6.8 1.7 - 7.7 K/uL   Lymphocytes Relative 6 %   Lymphs Abs 0.5 (L) 0.7 - 4.0 K/uL   Monocytes Relative 5 %   Monocytes Absolute 0.4 0.1 - 1.0 K/uL   Eosinophils Relative 0 %   Eosinophils Absolute 0.0 0.0 - 0.7 K/uL   Basophils Relative 0 %   Basophils Absolute 0.0 0.0 - 0.1 K/uL  Lactic acid, plasma  Result Value Ref Range   Lactic Acid, Venous 1.5 0.5 - 1.9 mmol/L  Lactic acid, plasma  Result Value Ref Range   Lactic Acid, Venous 0.8 0.5 - 1.9 mmol/L  Troponin I  Result Value Ref Range   Troponin I 0.03 (HH) <0.03 ng/mL  CBC  Result Value Ref Range   WBC 5.6 4.0 - 10.5 K/uL   RBC 2.43 (L) 4.22 - 5.81 MIL/uL   Hemoglobin 7.5 (L) 13.0 - 17.0 g/dL   HCT 22.8 (L) 39.0 - 52.0 %   MCV 93.8 78.0 - 100.0 fL   MCH 30.9 26.0 - 34.0 pg   MCHC 32.9 30.0 - 36.0 g/dL   RDW 14.0 11.5 - 15.5 %   Platelets 246 150 - 400 K/uL  Comprehensive metabolic panel  Result Value Ref Range   Sodium 139 135 - 145 mmol/L   Potassium 5.3 (H) 3.5 - 5.1 mmol/L   Chloride 112 (H) 98 - 111 mmol/L   CO2 20 (L) 22 - 32 mmol/L   Glucose, Bld 108 (H) 70 - 99 mg/dL   BUN 74 (H) 8 - 23 mg/dL   Creatinine, Ser 3.62 (H) 0.61 - 1.24 mg/dL   Calcium 8.3 (L) 8.9 - 10.3 mg/dL   Total Protein  6.0 (L) 6.5 - 8.1 g/dL   Albumin 2.9 (L) 3.5 - 5.0 g/dL   AST 30 15 - 41 U/L   ALT 27 0 - 44 U/L   Alkaline Phosphatase 82 38 - 126 U/L   Total Bilirubin 0.5 0.3 - 1.2 mg/dL   GFR calc non Af Amer 14 (L) >60 mL/min   GFR calc Af Amer 17 (L) >60 mL/min   Anion gap 7 5 - 15  CBC with Differential  Result Value Ref Range   WBC 9.6 4.0 - 10.5 K/uL   RBC 2.61 (L) 4.22 - 5.81 MIL/uL   Hemoglobin 8.1 (L) 13.0 - 17.0 g/dL   HCT 24.3 (L) 39.0 - 52.0 %   MCV 93.1 78.0 - 100.0 fL   MCH 31.0  26.0 - 34.0 pg   MCHC 33.3 30.0 - 36.0 g/dL   RDW 13.9 11.5 - 15.5 %   Platelets 292 150 - 400 K/uL   Neutrophils Relative % 88 %   Neutro Abs 8.5 (H) 1.7 - 7.7 K/uL   Lymphocytes Relative 6 %   Lymphs Abs 0.5 (L) 0.7 - 4.0 K/uL   Monocytes Relative 6 %   Monocytes Absolute 0.5 0.1 - 1.0 K/uL   Eosinophils Relative 0 %   Eosinophils Absolute 0.0 0.0 - 0.7 K/uL   Basophils Relative 0 %   Basophils Absolute 0.0 0.0 - 0.1 K/uL  Basic metabolic panel  Result Value Ref Range   Sodium 135 135 - 145 mmol/L   Potassium 6.0 (H) 3.5 - 5.1 mmol/L   Chloride 108 98 - 111 mmol/L   CO2 16 (L) 22 - 32 mmol/L   Glucose, Bld 113 (H) 70 - 99 mg/dL   BUN 87 (H) 8 - 23 mg/dL   Creatinine, Ser 5.11 (H) 0.61 - 1.24 mg/dL   Calcium 8.3 (L) 8.9 - 10.3 mg/dL   GFR calc non Af Amer 10 (L) >60 mL/min   GFR calc Af Amer 11 (L) >60 mL/min   Anion gap 11 5 - 15  Basic metabolic panel  Result Value Ref Range   Sodium 136 135 - 145 mmol/L   Potassium 5.5 (H) 3.5 - 5.1 mmol/L   Chloride 108 98 - 111 mmol/L   CO2 17 (L) 22 - 32 mmol/L   Glucose, Bld 88 70 - 99 mg/dL   BUN 90 (H) 8 - 23 mg/dL   Creatinine, Ser 5.70 (H) 0.61 - 1.24 mg/dL   Calcium 8.6 (L) 8.9 - 10.3 mg/dL   GFR calc non Af Amer 8 (L) >60 mL/min   GFR calc Af Amer 10 (L) >60 mL/min   Anion gap 11 5 - 15  CBC with Differential  Result Value Ref Range   WBC 5.7 4.0 - 10.5 K/uL   RBC 2.33 (L) 4.22 - 5.81 MIL/uL   Hemoglobin 7.2 (L) 13.0 - 17.0 g/dL    HCT 21.8 (L) 39.0 - 52.0 %   MCV 93.6 78.0 - 100.0 fL   MCH 30.9 26.0 - 34.0 pg   MCHC 33.0 30.0 - 36.0 g/dL   RDW 14.0 11.5 - 15.5 %   Platelets 251 150 - 400 K/uL   Neutrophils Relative % 74 %   Neutro Abs 4.3 1.7 - 7.7 K/uL   Lymphocytes Relative 16 %   Lymphs Abs 0.9 0.7 - 4.0 K/uL   Monocytes Relative 8 %   Monocytes Absolute 0.4 0.1 - 1.0 K/uL   Eosinophils Relative 1 %   Eosinophils Absolute 0.1 0.0 - 0.7 K/uL   Basophils Relative 1 %   Basophils Absolute 0.0 0.0 - 0.1 K/uL  Type and screen [facility]  Result Value Ref Range   ABO/RH(D) O POS    Antibody Screen NEG    Sample Expiration 07/24/2018    Unit Number [id number]    Blood Component Type RED CELLS,LR    Unit division 00    Status of Unit ISSUED    Transfusion Status OK TO TRANSFUSE    Crossmatch Result      Compatible Performed at [facility], [address] [place], [place] [address]   Prepare RBC  Result Value Ref Range   Order Confirmation      ORDER PROCESSED BY BLOOD BANK Performed at [facility],  [address], [place], [place] [address]   ABO/Rh  Result Value Ref Range   ABO/RH(D)      O POS Performed at [facility], [address] [place], [place] [address]   BPAM [facility]  Result Value Ref Range   ISSUE DATE / TIME [id number]    Blood Product Unit Number [id number]    PRODUCT CODE [id number]    Unit Type and Rh 5100    Blood Product Expiration Date [id number]    Hemoglobin  Date Value Ref Range Status  07/23/2018 7.2 (L) 13.0 - 17.0 g/dL Final  07/22/2018 8.1 (L) 13.0 - 17.0 g/dL Final  07/21/2018 7.5 (L) 13.0 - 17.0 g/dL Final  07/20/2018 8.0 (L) 13.0 - 17.0 g/dL Final   BUN  Date Value Ref Range Status  07/23/2018 90 (H) 8 - 23 mg/dL Final  07/22/2018 87 (H) 8 - 23 mg/dL Final  07/21/2018 74 (H) 8 - 23 mg/dL Final  07/20/2018 95 (H) 8 - 23 mg/dL Final   Creatinine, Ser  Date Value Ref Range Status  07/23/2018 5.70 (H) 0.61 - 1.24 mg/dL Final  07/22/2018 5.11  (H) 0.61 - 1.24 mg/dL Final  07/21/2018 3.62 (H) 0.61 - 1.24 mg/dL Final  07/20/2018 4.54 (H) 0.61 - 1.24 mg/dL Final     4:53 PM Reevaluation with update and discussion. After initial assessment and treatment, an updated evaluation reveals patient remains communicative, alert and fairly comfortable.Daleen Bo   Medical Decision Making: Likely ill patient, with hematuria, and renal insufficiency and multiple co-morbidities.  Urinary bladder, with less than 200 cc following voiding today.  This is goal placed by the urology service.  Relatively stable but increased creatinine, and improving potassium relative to yesterday.  Patient is stable for management in the outpatient setting.  His PCP will arrange follow-up with nephrology, and he has a scheduled appointment for 1 week at his urology office in [facility].  Doubt that the patient's artificial urinary sphincter, has been deactivated so he will remain incontinent, and that he will require ongoing management for bladder spasm with oxybutynin, 3 times daily.   CRITICAL CARE-yes Performed by: Daleen Bo   .Critical Care Performed by: Daleen Bo, MD Authorized by: Daleen Bo, MD   Critical care provider statement:    Critical care time (minutes):  35   Critical care start time:  07/23/2018 9:30 AM   Critical care end time:  07/23/2018 4:56 PM   Critical care time was exclusive of:  Separately billable procedures and treating other patients   Critical care was necessary to treat or prevent imminent or life-threatening deterioration of the following conditions:  Metabolic crisis and circulatory failure   Critical care was time spent personally by me on the following activities:  Blood draw for specimens, development of treatment plan with patient or surrogate, discussions with consultants, evaluation of patient's response to treatment, examination of patient, obtaining history from patient or surrogate, ordering  and performing treatments and interventions, ordering and review of laboratory studies, pulse oximetry, re-evaluation of patient's condition, review of old charts and ordering and review of radiographic studies   Diagnoses that have been ruled out:  None  Diagnoses that are still under consideration:  None  Final diagnoses:  Gross hematuria  Acute renal failure, unspecified acute renal failure type ([place])  Anemia, unspecified type  Hematuria, unspecified type        Daleen Bo, MD 07/23/18 1657

## 2018-07-23 NOTE — Discharge Instructions (Addendum)
Follow up with your doctor at the [facility] for further treatment.  You will need to follow-up with a nephrologist, regarding your renal insufficiency, and elevated potassium.  They may need to give you some Kayexalate if your potassium stays high over the next few days.  Also follow-up with your urologist at [facility], as scheduled on July 30, 2018.  Return here, if needed, or problems.

## 2018-07-23 NOTE — ED Notes (Signed)
Pt now leaving with NT from [facility]. Belongings given to pt.

## 2018-07-23 NOTE — ED Notes (Signed)
ED Provider at bedside. 

## 2018-07-24 ENCOUNTER — Inpatient Hospital Stay
Admission: RE | Admit: 2018-07-24 | Discharge: 2018-07-24 | Disposition: A | Discharge status: Other Acute Inpatient Hospital | Payer: MEDICARE | Source: Ambulatory Visit | Attending: Internal Medicine | Admitting: Internal Medicine

## 2018-07-24 ENCOUNTER — Other Ambulatory Visit: Payer: Self-pay

## 2018-07-24 ENCOUNTER — Encounter (HOSPITAL_COMMUNITY)
Admission: RE | Admit: 2018-07-24 | Discharge: 2018-07-24 | Disposition: A | Discharge status: Home / Self Care | Payer: MEDICARE | Source: Skilled Nursing Facility | Attending: Internal Medicine | Admitting: Internal Medicine

## 2018-07-24 ENCOUNTER — Emergency Department (HOSPITAL_COMMUNITY)
Admission: EM | Admit: 2018-07-24 | Discharge: 2018-07-24 | Disposition: A | Discharge status: Other Acute Inpatient Hospital | Payer: MEDICARE | Attending: Emergency Medicine | Admitting: Emergency Medicine

## 2018-07-24 ENCOUNTER — Encounter (HOSPITAL_COMMUNITY): Payer: Self-pay | Admitting: Emergency Medicine

## 2018-07-24 ENCOUNTER — Non-Acute Institutional Stay (SKILLED_NURSING_FACILITY): Payer: MEDICARE | Admitting: Internal Medicine

## 2018-07-24 DIAGNOSIS — N185 Chronic kidney disease, stage 5: Secondary | ICD-10-CM | POA: Diagnosis not present

## 2018-07-24 DIAGNOSIS — N393 Stress incontinence (female) (male): Secondary | ICD-10-CM | POA: Diagnosis not present

## 2018-07-24 DIAGNOSIS — Z923 Personal history of irradiation: Secondary | ICD-10-CM | POA: Diagnosis not present

## 2018-07-24 DIAGNOSIS — I2510 Atherosclerotic heart disease of native coronary artery without angina pectoris: Secondary | ICD-10-CM | POA: Diagnosis present

## 2018-07-24 DIAGNOSIS — R799 Abnormal finding of blood chemistry, unspecified: Secondary | ICD-10-CM | POA: Diagnosis present

## 2018-07-24 DIAGNOSIS — D649 Anemia, unspecified: Secondary | ICD-10-CM | POA: Diagnosis not present

## 2018-07-24 DIAGNOSIS — E878 Other disorders of electrolyte and fluid balance, not elsewhere classified: Secondary | ICD-10-CM | POA: Diagnosis not present

## 2018-07-24 DIAGNOSIS — N184 Chronic kidney disease, stage 4 (severe): Secondary | ICD-10-CM | POA: Insufficient documentation

## 2018-07-24 DIAGNOSIS — R319 Hematuria, unspecified: Secondary | ICD-10-CM

## 2018-07-24 DIAGNOSIS — E785 Hyperlipidemia, unspecified: Secondary | ICD-10-CM | POA: Diagnosis not present

## 2018-07-24 DIAGNOSIS — D500 Iron deficiency anemia secondary to blood loss (chronic): Secondary | ICD-10-CM | POA: Diagnosis not present

## 2018-07-24 DIAGNOSIS — G20 Parkinson's disease: Secondary | ICD-10-CM | POA: Diagnosis not present

## 2018-07-24 DIAGNOSIS — N189 Chronic kidney disease, unspecified: Secondary | ICD-10-CM | POA: Diagnosis not present

## 2018-07-24 DIAGNOSIS — R600 Localized edema: Secondary | ICD-10-CM | POA: Diagnosis not present

## 2018-07-24 DIAGNOSIS — N3091 Cystitis, unspecified with hematuria: Secondary | ICD-10-CM | POA: Diagnosis not present

## 2018-07-24 DIAGNOSIS — Z79899 Other long term (current) drug therapy: Secondary | ICD-10-CM | POA: Insufficient documentation

## 2018-07-24 DIAGNOSIS — R9431 Abnormal electrocardiogram [ECG] [EKG]: Secondary | ICD-10-CM | POA: Diagnosis not present

## 2018-07-24 DIAGNOSIS — E869 Volume depletion, unspecified: Secondary | ICD-10-CM | POA: Diagnosis not present

## 2018-07-24 DIAGNOSIS — D696 Thrombocytopenia, unspecified: Secondary | ICD-10-CM | POA: Insufficient documentation

## 2018-07-24 DIAGNOSIS — T83191A Other mechanical complication of urinary sphincter implant, initial encounter: Secondary | ICD-10-CM | POA: Diagnosis not present

## 2018-07-24 DIAGNOSIS — Z8546 Personal history of malignant neoplasm of prostate: Secondary | ICD-10-CM | POA: Diagnosis not present

## 2018-07-24 DIAGNOSIS — N138 Other obstructive and reflux uropathy: Secondary | ICD-10-CM | POA: Diagnosis present

## 2018-07-24 DIAGNOSIS — I120 Hypertensive chronic kidney disease with stage 5 chronic kidney disease or end stage renal disease: Secondary | ICD-10-CM | POA: Diagnosis not present

## 2018-07-24 DIAGNOSIS — N289 Disorder of kidney and ureter, unspecified: Secondary | ICD-10-CM | POA: Diagnosis not present

## 2018-07-24 DIAGNOSIS — R7989 Other specified abnormal findings of blood chemistry: Secondary | ICD-10-CM | POA: Diagnosis not present

## 2018-07-24 DIAGNOSIS — R2241 Localized swelling, mass and lump, right lower limb: Secondary | ICD-10-CM | POA: Diagnosis not present

## 2018-07-24 DIAGNOSIS — E872 Acidosis: Secondary | ICD-10-CM | POA: Diagnosis not present

## 2018-07-24 DIAGNOSIS — I129 Hypertensive chronic kidney disease with stage 1 through stage 4 chronic kidney disease, or unspecified chronic kidney disease: Secondary | ICD-10-CM | POA: Diagnosis not present

## 2018-07-24 DIAGNOSIS — R310 Gross hematuria: Secondary | ICD-10-CM | POA: Diagnosis not present

## 2018-07-24 DIAGNOSIS — Z9079 Acquired absence of other genital organ(s): Secondary | ICD-10-CM | POA: Diagnosis not present

## 2018-07-24 DIAGNOSIS — R262 Difficulty in walking, not elsewhere classified: Secondary | ICD-10-CM | POA: Diagnosis not present

## 2018-07-24 DIAGNOSIS — R7881 Bacteremia: Secondary | ICD-10-CM | POA: Insufficient documentation

## 2018-07-24 DIAGNOSIS — N3041 Irradiation cystitis with hematuria: Secondary | ICD-10-CM | POA: Diagnosis not present

## 2018-07-24 DIAGNOSIS — N320 Bladder-neck obstruction: Secondary | ICD-10-CM | POA: Diagnosis not present

## 2018-07-24 DIAGNOSIS — N3040 Irradiation cystitis without hematuria: Secondary | ICD-10-CM | POA: Diagnosis not present

## 2018-07-24 DIAGNOSIS — N179 Acute kidney failure, unspecified: Secondary | ICD-10-CM | POA: Insufficient documentation

## 2018-07-24 DIAGNOSIS — Z9689 Presence of other specified functional implants: Secondary | ICD-10-CM | POA: Diagnosis not present

## 2018-07-24 DIAGNOSIS — Z87891 Personal history of nicotine dependence: Secondary | ICD-10-CM | POA: Insufficient documentation

## 2018-07-24 DIAGNOSIS — I480 Paroxysmal atrial fibrillation: Secondary | ICD-10-CM | POA: Diagnosis not present

## 2018-07-24 DIAGNOSIS — A0472 Enterocolitis due to Clostridium difficile, not specified as recurrent: Secondary | ICD-10-CM | POA: Diagnosis not present

## 2018-07-24 DIAGNOSIS — K219 Gastro-esophageal reflux disease without esophagitis: Secondary | ICD-10-CM | POA: Diagnosis not present

## 2018-07-24 DIAGNOSIS — I4891 Unspecified atrial fibrillation: Secondary | ICD-10-CM | POA: Diagnosis not present

## 2018-07-24 DIAGNOSIS — C61 Malignant neoplasm of prostate: Secondary | ICD-10-CM | POA: Insufficient documentation

## 2018-07-24 DIAGNOSIS — R339 Retention of urine, unspecified: Secondary | ICD-10-CM

## 2018-07-24 DIAGNOSIS — N1330 Unspecified hydronephrosis: Secondary | ICD-10-CM | POA: Diagnosis present

## 2018-07-24 DIAGNOSIS — E875 Hyperkalemia: Secondary | ICD-10-CM | POA: Diagnosis not present

## 2018-07-24 DIAGNOSIS — N1339 Other hydronephrosis: Secondary | ICD-10-CM | POA: Diagnosis not present

## 2018-07-24 DIAGNOSIS — Z7982 Long term (current) use of aspirin: Secondary | ICD-10-CM | POA: Insufficient documentation

## 2018-07-24 DIAGNOSIS — R2689 Other abnormalities of gait and mobility: Secondary | ICD-10-CM | POA: Diagnosis not present

## 2018-07-24 DIAGNOSIS — F329 Major depressive disorder, single episode, unspecified: Secondary | ICD-10-CM | POA: Insufficient documentation

## 2018-07-24 DIAGNOSIS — R279 Unspecified lack of coordination: Secondary | ICD-10-CM | POA: Diagnosis not present

## 2018-07-24 DIAGNOSIS — M545 Low back pain: Secondary | ICD-10-CM | POA: Diagnosis not present

## 2018-07-24 DIAGNOSIS — N131 Hydronephrosis with ureteral stricture, not elsewhere classified: Secondary | ICD-10-CM | POA: Diagnosis not present

## 2018-07-24 DIAGNOSIS — N390 Urinary tract infection, site not specified: Secondary | ICD-10-CM | POA: Diagnosis not present

## 2018-07-24 DIAGNOSIS — M7989 Other specified soft tissue disorders: Secondary | ICD-10-CM | POA: Diagnosis not present

## 2018-07-24 DIAGNOSIS — N3289 Other specified disorders of bladder: Secondary | ICD-10-CM | POA: Diagnosis not present

## 2018-07-24 DIAGNOSIS — A4151 Sepsis due to Escherichia coli [E. coli]: Secondary | ICD-10-CM | POA: Insufficient documentation

## 2018-07-24 DIAGNOSIS — R34 Anuria and oliguria: Secondary | ICD-10-CM | POA: Diagnosis not present

## 2018-07-24 DIAGNOSIS — Z936 Other artificial openings of urinary tract status: Secondary | ICD-10-CM | POA: Diagnosis not present

## 2018-07-24 DIAGNOSIS — Z743 Need for continuous supervision: Secondary | ICD-10-CM | POA: Diagnosis not present

## 2018-07-24 DIAGNOSIS — N2889 Other specified disorders of kidney and ureter: Secondary | ICD-10-CM | POA: Diagnosis not present

## 2018-07-24 DIAGNOSIS — R001 Bradycardia, unspecified: Secondary | ICD-10-CM | POA: Diagnosis not present

## 2018-07-24 DIAGNOSIS — Z66 Do not resuscitate: Secondary | ICD-10-CM | POA: Diagnosis not present

## 2018-07-24 DIAGNOSIS — D631 Anemia in chronic kidney disease: Secondary | ICD-10-CM | POA: Diagnosis not present

## 2018-07-24 DIAGNOSIS — D62 Acute posthemorrhagic anemia: Secondary | ICD-10-CM | POA: Diagnosis not present

## 2018-07-24 DIAGNOSIS — E611 Iron deficiency: Secondary | ICD-10-CM | POA: Diagnosis not present

## 2018-07-24 DIAGNOSIS — R000 Tachycardia, unspecified: Secondary | ICD-10-CM | POA: Diagnosis not present

## 2018-07-24 LAB — TYPE AND SCREEN
ABO/RH(D): O POS
Antibody Screen: NEGATIVE
Unit division: 0

## 2018-07-24 LAB — BASIC METABOLIC PANEL
Anion gap: 10 (ref 5–15)
BUN: 100 mg/dL — ABNORMAL HIGH (ref 8–23)
CO2: 17 mmol/L — ABNORMAL LOW (ref 22–32)
Calcium: 8.3 mg/dL — ABNORMAL LOW (ref 8.9–10.3)
Chloride: 108 mmol/L (ref 98–111)
Creatinine, Ser: 8.02 mg/dL — ABNORMAL HIGH (ref 0.61–1.24)
GFR calc Af Amer: 6 mL/min — ABNORMAL LOW (ref >60)
GFR calc non Af Amer: 6 mL/min — ABNORMAL LOW (ref >60)
Glucose, Bld: 108 mg/dL — ABNORMAL HIGH (ref 70–99)
Potassium: 5.2 mmol/L — ABNORMAL HIGH (ref 3.5–5.1)
Sodium: 135 mmol/L (ref 135–145)

## 2018-07-24 LAB — CBC WITH DIFFERENTIAL/PLATELET
Basophils Absolute: 0 10*3/uL (ref 0.0–0.1)
Basophils Relative: 0 %
Eosinophils Absolute: 0.1 10*3/uL (ref 0.0–0.7)
Eosinophils Relative: 1 %
HCT: 26.6 % — ABNORMAL LOW (ref 39.0–52.0)
Hemoglobin: 9 g/dL — ABNORMAL LOW (ref 13.0–17.0)
Lymphocytes Relative: 8 %
Lymphs Abs: 0.8 10*3/uL (ref 0.7–4.0)
MCH: 30.7 pg (ref 26.0–34.0)
MCHC: 33.8 g/dL (ref 30.0–36.0)
MCV: 90.8 fL (ref 78.0–100.0)
Monocytes Absolute: 0.7 10*3/uL (ref 0.1–1.0)
Monocytes Relative: 8 %
Neutro Abs: 7.7 10*3/uL (ref 1.7–7.7)
Neutrophils Relative %: 83 %
Platelets: 239 10*3/uL (ref 150–400)
RBC: 2.93 MIL/uL — ABNORMAL LOW (ref 4.22–5.81)
RDW: 14.4 % (ref 11.5–15.5)
WBC: 9.3 10*3/uL (ref 4.0–10.5)

## 2018-07-24 LAB — BPAM RBC
Blood Product Expiration Date: 201909202359
ISSUE DATE / TIME: 201908221239
Unit Type and Rh: 5100

## 2018-07-24 NOTE — ED Provider Notes (Addendum)
[facility] EMERGENCY DEPARTMENT Provider Note   CSN: [id number] Arrival date & time: 07/24/18  1557     History   Chief Complaint Chief Complaint  Patient presents with  . Abnormal Lab    HPI [name] [name] is a 84 y.o. male.  HPI   Patient is here because his creatinine increased today, on repeat lab testing.  Patient known to me, after a care for him yesterday in the emergency department.  He had planned repeat labs today to check on progress and since the creatinine was high, his provider contacted the urology service at [facility].  They were unable to accommodate him there today for care because of lack of open beds, so he was sent here for evaluation and intervention as needed.  At this time the patient states that his bladder spasms are well controlled and he is not having abdominal pain.  He denies chest pain, shortness of breath, dizziness or weakness.  He has been taking his usual prescribed medications.  There are no other known modifying factors.  Past Medical History:  Diagnosis Date  . 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   . Cancer ([place])    prostate ca 1999/removal/ rad tx  . Carotid artery occlusion   . Hyperlipidemia   . Hypertension   . Incontinence   . Reflux   . Renal disorder   . Status post implantation of artificial urinary sphincter     Patient Active Problem List   Diagnosis Date Noted  . Sepsis due to urinary tract infection ([place]) 07/19/2018  . Paroxysmal A-fib ([place]) 07/19/2018  . Chronic kidney disease, stage IV (severe) ([place]) 07/19/2018  . Hyperlipidemia 08/05/2014  . Knee effusion, right 04/21/2012  . Essential hypertension 07/25/2011  . CAROTID ARTERY DISEASE 06/14/2010  . CLOSED FRACTURE OF ACROMIAL END OF CLAVICLE 04/25/2010    Past Surgical History:  Procedure Laterality Date  . cataracts    . HERNIA REPAIR    . radical prostectomy    .  URINARY SPHINCTER IMPLANT          Home Medications    Prior to Admission medications   Medication Sig Start Date End Date Taking? Authorizing Provider  acetaminophen (TYLENOL) 500 MG tablet Take 500 mg by mouth daily as needed for mild pain or moderate pain. For headache pain    Yes [provider]  ALFALFA PO Take 250 mg by mouth daily.    Yes [provider]  ARIPiprazole (ABILIFY) 15 MG tablet Take 15 mg by mouth daily.    Yes [provider]  aspirin EC 81 MG tablet Take 81 mg by mouth daily.     Yes [provider]  fish oil-omega-3 fatty acids 1000 MG capsule Take 1 g by mouth 2 (two) times daily.    Yes [provider]  Flaxseed, Linseed, (FLAX SEED OIL) 1000 MG CAPS Take 1 capsule by mouth daily.   Yes [provider]  Methylcellulose, Laxative, (CITRUCEL) 500 MG TABS Take 1 tablet by mouth daily as needed (for constipation).    Yes [provider]  metoprolol tartrate (LOPRESSOR) 25 MG tablet Take 12.5 mg by mouth 2 (two) times daily.   Yes [provider]  Multiple Vitamin (MULTIVITAMIN WITH MINERALS) TABS tablet Take 1 tablet by mouth daily.   Yes [provider]  oxyCODONE (ROXICODONE) 5 MG immediate release tablet Take 1  tablet (5 mg total) by mouth every 8 (eight) hours as needed for severe pain. 07/23/18  Yes Virgel Manifold, MD  pantoprazole (PROTONIX) 40 MG tablet Take 40 mg by mouth daily.   Yes [provider]  rosuvastatin (CRESTOR) 10 MG tablet Take 10 mg by mouth daily.    Yes [provider]  sertraline (ZOLOFT) 25 MG tablet Take 25 mg by mouth daily.   Yes [provider]  vitamin B-12 (CYANOCOBALAMIN) 1000 MCG tablet Take 1,000 mcg by mouth daily.   Yes [provider]  ciprofloxacin (CIPRO) 500 MG tablet Take 500 mg by mouth daily. 4 day course starting on 07/17/2018    [provider]    Family History Family History  Problem Relation Age  of Onset  . Coronary artery disease Unknown        family history, male < 24  . Arthritis Unknown        family history  . Cancer Unknown   . Kidney disease Unknown     Social History Social History   Tobacco Use  . Smoking status: Former Smoker    Packs/day: 2.00    Types: Cigarettes    Start date: 08/05/1962    Last attempt to quit: 12/02/1976    Years since quitting: 41.6  . Smokeless tobacco: Never Used  Substance Use Topics  . Alcohol use: No  . Drug use: No     Allergies   Ivp dye [iodinated diagnostic agents]; Nalbuphine; and Codeine   Review of Systems Review of Systems  All other systems reviewed and are negative.    Physical Exam Updated Vital Signs BP 130/72   Pulse 64   Temp 98.7 F (37.1 C) (Oral)   Resp (!) 9   Ht 5\' 8"  (1.727 m)   Wt 79.4 kg   SpO2 99%   BMI 26.61 kg/m   Physical Exam  Constitutional: He is oriented to person, place, and time. He appears well-developed.  Elderly, frail  HENT:  Head: Normocephalic and atraumatic.  Right Ear: External ear normal.  Left Ear: External ear normal.  Eyes: Pupils are equal, round, and reactive to light. Conjunctivae and EOM are normal.  Neck: Normal range of motion and phonation normal. Neck supple.  Cardiovascular: Normal rate, regular rhythm and normal heart sounds.  Pulmonary/Chest: Effort normal and breath sounds normal. He exhibits no bony tenderness.  Abdominal: Soft. He exhibits no distension. There is no tenderness.  Musculoskeletal: Normal range of motion. He exhibits no edema, tenderness or deformity.  Neurological: He is alert and oriented to person, place, and time. No cranial nerve deficit or sensory deficit. He exhibits normal muscle tone. Coordination normal.  Skin: Skin is warm, dry and intact.  Psychiatric: He has a normal mood and affect. His behavior is normal.  Nursing note and vitals reviewed.    ED Treatments / Results  Labs (all labs ordered are listed, but only  abnormal results are displayed) Labs Reviewed - No data to display  EKG None  Radiology No results found.  Procedures .Critical Care Performed by: Daleen Bo, MD Authorized by: Daleen Bo, MD   Critical care provider statement:    Critical care time (minutes):  35   Critical care start time:  07/24/2018 4:10 PM   Critical care end time:  07/24/2018 7:01 PM   Critical care time was exclusive of:  Separately billable procedures and treating other patients   Critical care was necessary to treat or prevent imminent or  life-threatening deterioration of the following conditions:  Circulatory failure and metabolic crisis   Critical care was time spent personally by me on the following activities:  Blood draw for specimens, development of treatment plan with patient or surrogate, discussions with consultants, evaluation of patient's response to treatment, examination of patient, obtaining history from patient or surrogate, ordering and performing treatments and interventions, ordering and review of laboratory studies, pulse oximetry, re-evaluation of patient's condition, review of old charts and ordering and review of radiographic studies   (including critical care time)  Medications Ordered in ED Medications - No data to display   Initial Impression / Assessment and Plan / ED Course  I have reviewed the triage vital signs and the nursing notes.  Pertinent labs & imaging results that were available during my care of the patient were reviewed by me and considered in my medical decision making (see chart for details).      Patient Vitals for the past 24 hrs:  BP Temp Temp src Pulse Resp SpO2 Height Weight  07/24/18 1900 130/72 - - 64 (!) 9 99 % - -  07/24/18 1852 136/61 - - 67 12 99 % - -  07/24/18 1831 (!) 151/57 - - 69 13 99 % - -  07/24/18 1800 (!) 140/56 - - 68 10 98 % - -  07/24/18 1730 (!) 128/52 - - 65 12 99 % - -  07/24/18 1700 (!) 132/55 - - 70 12 99 % - -  07/24/18  1630 (!) 116/53 - - 74 - 100 % - -  07/24/18 1615 (!) 124/58 - - 66 - 98 % - -  07/24/18 1603 - - - - - - 5\' 8"  (1.727 m) 79.4 kg  07/24/18 1600 - 98.7 F (37.1 C) Oral - - - - -    6:58 PM Reevaluation with update and discussion. After initial assessment and treatment, an updated evaluation reveals patient remains stable, and ready for transport at this time. Daleen Bo   Medical Decision Making: Multifactorial renal failure, with persistent hematuria, and bladder outlet obstruction.  Suspect hematuria causing decreased urinary output.  Creatinine climbing over the last 4 weeks, etiology not clear.  Potassium mildly elevated, not requiring intervention, since the patient is hemodynamically stable.  No evidence of respiratory distress.  Patient requires transfer to [facility] for evaluation and treatment of his complex neurologic condition which is associated with hematuria and worsening creatinine.  He will be transferred, urgently for treatment allergy team, and likely require nephrology consultation with consideration for dialysis.  CRITICAL CARE-yes Performed by: Daleen Bo   Nursing Notes Reviewed/ Care Coordinated Applicable Imaging Reviewed Interpretation of Laboratory Data incorporated into ED treatment   Plan-transfer to [facility] emergency department, by [facility].    Final Clinical Impressions(s) / ED Diagnoses   Final diagnoses:  Acute renal failure, unspecified acute renal failure type [facility])  Gross hematuria  Urinary retention    ED Discharge Orders    None       Daleen Bo, MD 07/24/18 1901    Daleen Bo, MD 07/24/18 2203

## 2018-07-24 NOTE — ED Triage Notes (Signed)
Pt from [facility] for elevated creatinine. Per [facility], pt creatinine level has increased from 5 to 8 and was sent over for further evaluation of labs.

## 2018-07-25 ENCOUNTER — Encounter: Payer: Self-pay | Admitting: Internal Medicine

## 2018-07-25 DIAGNOSIS — R310 Gross hematuria: Secondary | ICD-10-CM | POA: Insufficient documentation

## 2018-07-25 MED ORDER — VITAMIN B-12 1000 MCG PO TABS
1000.00 | ORAL_TABLET | ORAL | Status: DC
Start: 2018-07-28 — End: 2018-07-25

## 2018-07-25 MED ORDER — ARIPIPRAZOLE 15 MG PO TABS
15.00 | ORAL_TABLET | ORAL | Status: DC
Start: 2018-07-28 — End: 2018-07-25

## 2018-07-25 MED ORDER — GENERIC EXTERNAL MEDICATION
Status: DC
Start: ? — End: 2018-07-25

## 2018-07-25 MED ORDER — SODIUM BICARBONATE 650 MG PO TABS
650.00 | ORAL_TABLET | ORAL | Status: DC
Start: 2018-07-25 — End: 2018-07-25

## 2018-07-25 MED ORDER — DEXTROSE 10 % IV SOLN
250.00 | INTRAVENOUS | Status: DC
Start: 2018-07-25 — End: 2018-07-25

## 2018-07-25 MED ORDER — SERTRALINE HCL 25 MG PO TABS
25.00 | ORAL_TABLET | ORAL | Status: DC
Start: 2018-07-28 — End: 2018-07-25

## 2018-07-25 MED ORDER — METOPROLOL TARTRATE 25 MG PO TABS
12.50 | ORAL_TABLET | ORAL | Status: DC
Start: 2018-07-25 — End: 2018-07-25

## 2018-07-25 MED ORDER — PANTOPRAZOLE SODIUM 40 MG PO TBEC
40.00 | DELAYED_RELEASE_TABLET | ORAL | Status: DC
Start: 2018-07-28 — End: 2018-07-25

## 2018-07-25 MED ORDER — INSULIN REGULAR HUMAN 100 UNIT/ML IJ SOLN
5.00 | INTRAMUSCULAR | Status: DC
Start: 2018-07-25 — End: 2018-07-25

## 2018-07-25 MED ORDER — ROSUVASTATIN CALCIUM 5 MG PO TABS
10.00 | ORAL_TABLET | ORAL | Status: DC
Start: 2018-07-28 — End: 2018-07-25

## 2018-07-25 MED ORDER — GENERIC EXTERNAL MEDICATION
1.00 g | Status: DC
Start: 2018-07-25 — End: 2018-07-25

## 2018-07-25 NOTE — Progress Notes (Signed)
This is an acute visit.  Level of care skilled.  Facility is [facility].  Chief complaint- visit secondary to acute on chronic renal failure- with history of urinary retention with hematuria   History of present illness.  Patient is a pleasant 84 year old male  He has a history of prostate cancer status post radical prostatectomy active radiation placement of an artificial urinary sphincter 2012.  He originally presented to [facility], ER with urinary retention and hematuria as well as fevers- he was hypotensive tachycardic and in pain.  Attempt to deflate his sphincter device were unsuccessful in the ED- he was sent to [place] was admitted for sepsis with E. coli bacteremia UTI-as well as hematuria thought secondary to hemorrhagic cystitis.  He received vancomycin and Zosyn empirically renal ultrasound was negative for any apparent nephrotic abscess or hydronephrosis-.  Blood cultures were positive for E. coli he was de-escalated antibiotic wise to Rocephin.  Eventually he was switched to oral Cipro to run through August 20  Shortly after being in skilled nursing he did develop some significant suprapubic pain unable to void- and was sent to the ED.  ER did do a bladder scan which showed 180 cc of urine after IV fluids was given the diaper did become Foley urine there was no retention on repeat scan.  He returned to the facility  What labs show his hemoglobin continued to drop gradually- and creatinine was rising gradually as well as BUN-  Did speak with his son who expressed concerns and would like him evaluated at [facility] thus he was sent to the ER.  Apparently there were no open beds available at [facility] and he was managed in the ER- at [facility] Did receive a unit of packed red blood cells secondary to declining hemoglobin-.  Creatinine was mildly rising but not precipitously so. This was discussed with urology at [facility] urology  service- and i patient was discharged back to skilled nursing with recommendation for expedient follow-up with urology at [facility]   Follow-up labs were ordered for this morning which shows fairly significant rising creatinine at 8.02 with a BUN of 100.  Yesterday it was 5.7 creatinine with a BUN of 90.  Potassium is mildly elevated at 5.2 actually it was 6 3 days ago- it appears he was given Kayexalate at that time with improvement there was recommendation to give him Kayexalate as needed for mildly elevated potassium   His hemoglobin does show improvement today at 9.0 it was down to 7.2 yesterday prior to his transfusion.  Currently he is complaining of some lower abdominal suprapubic discomfort but would not classify this as acute.  Continues to have some hematuria.  Vital signs appear to be stable.    Past Medical History:  Diagnosis Date  . 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   . Cancer ([place])    prostate ca 1999/removal/ rad tx  . Carotid artery occlusion   . Hyperlipidemia   . Hypertension   . Incontinence   . Reflux   . Renal disorder   . Status post implantation of artificial urinary sphincter         Patient Active Problem List   Diagnosis Date Noted  . Sepsis due to urinary tract infection ([place]) 07/19/2018  . Paroxysmal A-fib ([place]) 07/19/2018  . Chronic kidney disease, stage IV (severe) ([place]) 07/19/2018  . Hyperlipidemia 08/05/2014  .  Knee effusion, right 04/21/2012  . Essential hypertension 07/25/2011  . CAROTID ARTERY DISEASE 06/14/2010  . CLOSED FRACTURE OF ACROMIAL END OF CLAVICLE 04/25/2010         Past Surgical History:  Procedure Laterality Date  . cataracts    . HERNIA REPAIR    . radical prostectomy    . URINARY SPHINCTER IMPLANT                     MEDICATIONS  Medication Sig Start Date End Date Taking? Authorizing Provider   acetaminophen (TYLENOL) 500 MG tablet Take 500 mg by mouth daily as needed for mild pain or moderate pain. For headache pain    Yes [provider]  ALFALFA PO Take 250 mg by mouth daily.    Yes [provider]  ARIPiprazole (ABILIFY) 15 MG tablet Take 15 mg by mouth daily.    Yes [provider]  aspirin EC 81 MG tablet Take 81 mg by mouth daily.     Yes [provider]  fish oil-omega-3 fatty acids 1000 MG capsule Take 1 g by mouth 2 (two) times daily.    Yes [provider]  Flaxseed, Linseed, (FLAX SEED OIL) 1000 MG CAPS Take 1 capsule by mouth daily.   Yes [provider]  Methylcellulose, Laxative, (CITRUCEL) 500 MG TABS Take 1 tablet by mouth daily as needed (for constipation).    Yes [provider]  metoprolol tartrate (LOPRESSOR) 25 MG tablet Take 12.5 mg by mouth 2 (two) times daily.   Yes [provider]  Multiple Vitamin (MULTIVITAMIN WITH MINERALS) TABS tablet Take 1 tablet by mouth daily.   Yes [provider]  oxyCODONE (ROXICODONE) 5 MG immediate release tablet Take 1 tablet (5 mg total) by mouth every 8 (eight) hours as needed for severe pain. 07/23/18  Yes Virgel Manifold, MD  pantoprazole (PROTONIX) 40 MG tablet Take 40 mg by mouth daily.   Yes [provider]  rosuvastatin (CRESTOR) 10 MG tablet Take 10 mg by mouth daily.    Yes [provider]  sertraline (ZOLOFT) 25 MG tablet Take 25 mg by mouth daily.   Yes [provider]  vitamin B-12 (CYANOCOBALAMIN) 1000 MCG tablet Take 1,000 mcg by mouth daily.   Yes [provider]  ciprofloxacin (CIPRO) 500 MG tablet Take 500 mg by mouth daily. 4 day course starting on 07/17/2018    [provider]    Family History      Family History  Problem Relation Age of Onset  . Coronary artery disease Unknown        family history, male < 54  . Arthritis Unknown        family  history  . Cancer Unknown   . Kidney disease Unknown     Social History Social History        Tobacco Use  . Smoking status: Former Smoker    Packs/day: 2.00    Types: Cigarettes    Start date: 08/05/1962    Last attempt to quit: 12/02/1976    Years since quitting: 41.6  . Smokeless tobacco: Never Used  Substance Use Topics  . Alcohol use: No  . Drug use: No     Allergies              Ivp dye [iodinated diagnostic agents]; Nalbuphine; and Codeine  Review of systems.  General is not complaining of fever chills says he does feel weak.  Skin does not  complain of rashes itching or diaphoresis.  Head ears eyes nose mouth and throat does not complain of visual changes or sore throat.  Respiratory is not complaining of shortness of breath or cough.  Cardiac does not complain of chest pain has baseline lower extremity edema.  GI is not complaining of nausea vomiting diarrhea or constipation- is complaining some lower abdominal suprapubic discomfort  Gu-- has had hematuria and is complaining of some lower abdominal-suprapubic discomfort.  Musculoskeletal says he is weak but is not really complaining of joint pain.  Neurologic complains of weakness but is not complaining of feeling dizzy or having a headache or syncopal.  Psych does not complain of being overtly anxious or depressed does have somewhat of a flat affect question parkinsonian    Physical exam.  He is afebrile pulse is 80 respirations of 14 blood pressure 122/60.  In general this is a pleasant elderly male in no distress  His skin is warm and dry.\  \Oropharynx is clear mucous membranes moist  Eyes visual acuity appears to be intact he has prescription lenses.  Chest is clear to auscultation there is no labored breathing.  Heart is regular rate and rhythm without murmur gallop or rub he has mild lower extremity edema.  Abdomen is soft does not appear to be tender there are positive  bowel sounds.  GU does appear to have a small amount of blood at meatus of the penis- the overt hematuria at this time.  Has some mild suprapubic tenderness.  Musculoskeletal is able to move all extremities x4 at baseline with lower extremity weakness.  Neurologic as noted above no lateralizing findings his speech is clear.  Psych he is pleasant and appropriate   Labs.  July 24, 2018.  WBC 3-hemoglobin 9.0 platelets 239.  Sodium 135 potassium 5.2 BUN 100 creatinine 8.02.  Assessment and plan.  Acute on chronic renal failure history of hematuria and urinary retention- creatinine has risen significantly overnight now is over 8 with a BUN of 100- does not appear to be in any acute distress-vital signs remained stable  [facility] urology has been contacted and recommendation is to   Send patient to [facility] emergency room for evaluation and treatment-.  Dr   Vira Agar .Eulis Foster who did treat Mr. Yordy this week in the ER will be evaluating him--and I suspect eventually there will be transferr to [facility] for urology evaluation and treatment   [id number]-of note greater than 40 minutes spent assessing patient- reassessing patient later in the day- reviewing his chart and labs- discussing his status extensively with nursing staff- and coordinating plan of care- of note greater than 50% of time spent coordinating a plan of care with input as noted above

## 2018-07-26 ENCOUNTER — Encounter: Payer: Self-pay | Admitting: Internal Medicine

## 2018-07-28 MED ORDER — LACTATED RINGERS IV SOLN
INTRAVENOUS | Status: DC
Start: ? — End: 2018-07-28

## 2018-07-28 MED ORDER — CARBIDOPA-LEVODOPA 25-100 MG PO TABS
1.00 | ORAL_TABLET | ORAL | Status: DC
Start: 2018-07-28 — End: 2018-07-28

## 2018-07-28 MED ORDER — CIPROFLOXACIN HCL 250 MG PO TABS
250.00 | ORAL_TABLET | ORAL | Status: DC
Start: 2018-07-28 — End: 2018-07-28

## 2018-08-14 ENCOUNTER — Inpatient Hospital Stay
Admission: RE | Admit: 2018-08-14 | Discharge: 2018-09-05 | Disposition: A | Discharge status: Home / Self Care | Payer: MEDICARE | Source: Ambulatory Visit | Attending: Internal Medicine | Admitting: Internal Medicine

## 2018-08-14 DIAGNOSIS — N393 Stress incontinence (female) (male): Secondary | ICD-10-CM | POA: Diagnosis not present

## 2018-08-14 DIAGNOSIS — N289 Disorder of kidney and ureter, unspecified: Secondary | ICD-10-CM | POA: Diagnosis not present

## 2018-08-14 DIAGNOSIS — R279 Unspecified lack of coordination: Secondary | ICD-10-CM | POA: Diagnosis not present

## 2018-08-14 DIAGNOSIS — Z936 Other artificial openings of urinary tract status: Secondary | ICD-10-CM | POA: Diagnosis not present

## 2018-08-14 DIAGNOSIS — A0472 Enterocolitis due to Clostridium difficile, not specified as recurrent: Secondary | ICD-10-CM | POA: Diagnosis not present

## 2018-08-14 DIAGNOSIS — E785 Hyperlipidemia, unspecified: Secondary | ICD-10-CM | POA: Diagnosis not present

## 2018-08-14 DIAGNOSIS — R262 Difficulty in walking, not elsewhere classified: Secondary | ICD-10-CM | POA: Diagnosis not present

## 2018-08-14 DIAGNOSIS — R310 Gross hematuria: Secondary | ICD-10-CM | POA: Diagnosis not present

## 2018-08-14 DIAGNOSIS — R2689 Other abnormalities of gait and mobility: Secondary | ICD-10-CM | POA: Diagnosis not present

## 2018-08-14 DIAGNOSIS — I480 Paroxysmal atrial fibrillation: Secondary | ICD-10-CM | POA: Diagnosis not present

## 2018-08-14 DIAGNOSIS — L03818 Cellulitis of other sites: Secondary | ICD-10-CM | POA: Diagnosis not present

## 2018-08-14 DIAGNOSIS — D631 Anemia in chronic kidney disease: Secondary | ICD-10-CM | POA: Diagnosis not present

## 2018-08-14 DIAGNOSIS — N184 Chronic kidney disease, stage 4 (severe): Secondary | ICD-10-CM | POA: Diagnosis not present

## 2018-08-14 DIAGNOSIS — Z743 Need for continuous supervision: Secondary | ICD-10-CM | POA: Diagnosis not present

## 2018-08-14 DIAGNOSIS — R600 Localized edema: Secondary | ICD-10-CM | POA: Diagnosis not present

## 2018-08-15 ENCOUNTER — Encounter (HOSPITAL_COMMUNITY)
Admission: RE | Admit: 2018-08-15 | Discharge: 2018-08-15 | Disposition: A | Discharge status: Home / Self Care | Payer: MEDICARE | Source: Skilled Nursing Facility | Attending: Internal Medicine | Admitting: Internal Medicine

## 2018-08-15 DIAGNOSIS — A4151 Sepsis due to Escherichia coli [E. coli]: Secondary | ICD-10-CM | POA: Insufficient documentation

## 2018-08-15 LAB — BASIC METABOLIC PANEL
Anion gap: 12 (ref 5–15)
BUN: 37 mg/dL — ABNORMAL HIGH (ref 8–23)
CO2: 19 mmol/L — ABNORMAL LOW (ref 22–32)
Calcium: 8.7 mg/dL — ABNORMAL LOW (ref 8.9–10.3)
Chloride: 108 mmol/L (ref 98–111)
Creatinine, Ser: 3.15 mg/dL — ABNORMAL HIGH (ref 0.61–1.24)
GFR calc Af Amer: 20 mL/min — ABNORMAL LOW (ref >60)
GFR calc non Af Amer: 17 mL/min — ABNORMAL LOW (ref >60)
Glucose, Bld: 114 mg/dL — ABNORMAL HIGH (ref 70–99)
Potassium: 4.2 mmol/L (ref 3.5–5.1)
Sodium: 139 mmol/L (ref 135–145)

## 2018-08-15 LAB — CBC WITH DIFFERENTIAL/PLATELET
Basophils Absolute: 0 10*3/uL (ref 0.0–0.1)
Basophils Relative: 0 %
Eosinophils Absolute: 0.6 10*3/uL (ref 0.0–0.7)
Eosinophils Relative: 6 %
HCT: 30.5 % — ABNORMAL LOW (ref 39.0–52.0)
Hemoglobin: 9.8 g/dL — ABNORMAL LOW (ref 13.0–17.0)
Lymphocytes Relative: 23 %
Lymphs Abs: 2.2 10*3/uL (ref 0.7–4.0)
MCH: 29 pg (ref 26.0–34.0)
MCHC: 32.1 g/dL (ref 30.0–36.0)
MCV: 90.2 fL (ref 78.0–100.0)
Monocytes Absolute: 0.5 10*3/uL (ref 0.1–1.0)
Monocytes Relative: 6 %
Neutro Abs: 6.2 10*3/uL (ref 1.7–7.7)
Neutrophils Relative %: 65 %
Platelets: 316 10*3/uL (ref 150–400)
RBC: 3.38 MIL/uL — ABNORMAL LOW (ref 4.22–5.81)
RDW: 15.6 % — ABNORMAL HIGH (ref 11.5–15.5)
WBC: 9.5 10*3/uL (ref 4.0–10.5)

## 2018-08-17 ENCOUNTER — Non-Acute Institutional Stay (SKILLED_NURSING_FACILITY): Payer: MEDICARE | Admitting: Internal Medicine

## 2018-08-17 ENCOUNTER — Encounter: Payer: Self-pay | Admitting: Internal Medicine

## 2018-08-17 DIAGNOSIS — N184 Chronic kidney disease, stage 4 (severe): Secondary | ICD-10-CM | POA: Diagnosis not present

## 2018-08-17 DIAGNOSIS — R310 Gross hematuria: Secondary | ICD-10-CM | POA: Diagnosis not present

## 2018-08-17 DIAGNOSIS — E785 Hyperlipidemia, unspecified: Secondary | ICD-10-CM | POA: Diagnosis not present

## 2018-08-17 DIAGNOSIS — I480 Paroxysmal atrial fibrillation: Secondary | ICD-10-CM

## 2018-08-17 DIAGNOSIS — D631 Anemia in chronic kidney disease: Secondary | ICD-10-CM | POA: Diagnosis not present

## 2018-08-17 NOTE — Progress Notes (Signed)
Provider: Veleta Miners MD  Location:    [facility] Room Number: 130/P Place of Service:  SNF (31)  PCP: [name], MD Patient Care Team: [name], MD as PCP - General (Internal Medicine) [name], MD as Attending Physician (Cardiology)  Extended Emergency Contact Information Primary Emergency Contact: Mayra Neer of [place] Phone: [phone] Relation: Son  Code Status: DNR Goals of Care: Advanced Directive information Advanced Directives 08/17/2018  Does Patient Have a Medical Advance Directive? Yes  Type of Advance Directive Out of facility DNR (pink MOST or yellow form)  Does patient want to make changes to medical advance directive? No - Patient declined  Would patient like information on creating a medical advance directive? No - Patient declined  Pre-existing out of facility DNR order (yellow form or pink MOST form) -      Chief Complaint  Patient presents with  . New Admit To SNF    New Admssion Visit    HPI: Patient is a 84 y.o. male seen today for admission to SNF after staying in the hospital and Then ACE unit in [facility] from 08/23-09/13 for AKI and Bladder outlet Obstruction  Patient has h/o GERD, hyperlipidemia, HTN, CKD stage IV, and prostate cancer s/p radical prostatectomy with adjuvant radiation and artifical urinary sphincter (AUS) placement in 2012  He was initially Hospitalized for E Coli Bacteremia from 08/07-08/16 in setting of Bladder outlet Obstruction from Blood Clots .Due to Hemorrhagic Cystitis. And PAF with RVR   He was again admitted due to Hematuria and Worsenign Renal function.With Symptomatic Uremia.He had bilateral Nephrostomy Tube placed on 09/07 Since then patient Creat improved and came back to baseline. He also had Anemia thought to be due to Hematuria requiring 5 PRBC transfusion.  His last PRBC transfusion was on 09/09. Right lower extremity swelling  was noted venous Dopplers were negative for any DVT Patient did have paroxysmal A. fib in the previous hospitalization but due to hematuria he was never started on any anticoagulation.  He was started on beta-blocker but it was discontinued due to bradycardia.  Discharged on Ziopatch There was also concern for Parkinson disease and he was given a trial of Sinemet.  Eventually his Abilify was discontinued and he was started on Zoloft increasing to 100 mg. He is now in SNF for therapy.  He was living by himself before this hospitalization.  He does have one son who lives in [place] Patient's only complaint today was diarrhea he said he has been to the bathroom 4 times.  Denies any abdominal pain fever or chills no nausea or vomiting.  His appetite stays good  Past Medical History:  Diagnosis Date  . 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   . Cancer ([place])    prostate ca 1999/removal/ rad tx  . Carotid artery occlusion   . Hyperlipidemia   . Hypertension   . Incontinence   . Reflux   . Renal disorder   . Status post implantation of artificial urinary sphincter    Past Surgical History:  Procedure Laterality Date  . cataracts    . HERNIA REPAIR    . radical prostectomy    . URINARY SPHINCTER IMPLANT      reports that he quit smoking about 41 years ago. His smoking use included cigarettes. He started smoking about 56  years ago. He smoked 2.00 packs per day. He has never used smokeless tobacco. He reports that he does not drink alcohol or use drugs. Social History   Socioeconomic History  . Marital status: Widowed    Spouse name: Not on file  . Number of children: Not on file  . Years of education: Not on file  . Highest education level: Not on file  Occupational History  . Occupation: retired  Scientific laboratory technician  . Financial resource strain: Not on file  . Food insecurity:    Worry: Not on file    Inability: Not on  file  . Transportation needs:    Medical: Not on file    Non-medical: Not on file  Tobacco Use  . Smoking status: Former Smoker    Packs/day: 2.00    Types: Cigarettes    Start date: 08/05/1962    Last attempt to quit: 12/02/1976    Years since quitting: 41.7  . Smokeless tobacco: Never Used  Substance and Sexual Activity  . Alcohol use: No  . Drug use: No  . Sexual activity: Not on file  Lifestyle  . Physical activity:    Days per week: Not on file    Minutes per session: Not on file  . Stress: Not on file  Relationships  . Social connections:    Talks on phone: Not on file    Gets together: Not on file    Attends religious service: Not on file    Active member of club or organization: Not on file    Attends meetings of clubs or organizations: Not on file    Relationship status: Not on file  . Intimate partner violence:    Fear of current or ex partner: Not on file    Emotionally abused: Not on file    Physically abused: Not on file    Forced sexual activity: Not on file  Other Topics Concern  . Not on file  Social History Narrative  . Not on file    Functional Status Survey:    Family History  Problem Relation Age of Onset  . Coronary artery disease Unknown        family history, male < 13  . Arthritis Unknown        family history  . Cancer Unknown   . Kidney disease Unknown     Health Maintenance  Topic Date Due  . INFLUENZA VACCINE  08/20/2018 (Originally 07/02/2018)  . TETANUS/TDAP  08/20/2018 (Originally 10/01/1953)  . PNA vac Low Risk Adult (1 of 2 - PCV13) 08/20/2018 (Originally 09/30/2001)    Allergies  Allergen Reactions  . Ivp Dye [Iodinated Diagnostic Agents]     Due to partial renal failure  . Nalbuphine Other (See Comments)    PATIENT STATES IT MADE HIM FEEL LIKE HE WAS GOING TO DIE. NO SPECIFICS  . Codeine Anxiety    Outpatient Encounter Medications as of 08/17/2018  Medication Sig  . acetaminophen (TYLENOL) 500 MG tablet Take 500 mg by  mouth every 8 (eight) hours as needed for mild pain or moderate pain. For headache pain   . ALFALFA PO Take 250 mg by mouth daily.   Marland Kitchen amLODipine (NORVASC) 5 MG tablet Take 5 mg by mouth daily.  . fish oil-omega-3 fatty acids 1000 MG capsule Take 1 g by mouth 2 (two) times daily.   . Flaxseed, Linseed, (FLAX SEED OIL) 1000 MG CAPS Take 1 capsule by mouth daily.  . magnesium chloride (SLOW-MAG) 64 MG  TBEC SR tablet Take 1 tablet by mouth 2 (two) times daily.  . Multiple Vitamin (MULTIVITAMIN WITH MINERALS) TABS tablet Take 1 tablet by mouth daily.  Marland Kitchen nystatin (NYSTATIN) powder Apply 1,000,000 g topically 2 (two) times daily. Use for 2 weeks from 08/14/2018-926/2019  . pantoprazole (PROTONIX) 40 MG tablet Take 40 mg by mouth daily.  . polyethylene glycol (MIRALAX / GLYCOLAX) packet Take 17 g by mouth daily as needed.  . rosuvastatin (CRESTOR) 10 MG tablet Take 10 mg by mouth daily.   . sertraline (ZOLOFT) 100 MG tablet Take 100 mg by mouth daily.  . vitamin B-12 (CYANOCOBALAMIN) 1000 MCG tablet Take 1,000 mcg by mouth daily.  . [DISCONTINUED] ARIPiprazole (ABILIFY) 15 MG tablet Take 15 mg by mouth daily.   . [DISCONTINUED] aspirin EC 81 MG tablet Take 81 mg by mouth daily.    . [DISCONTINUED] ciprofloxacin (CIPRO) 500 MG tablet Take 500 mg by mouth daily. 4 day course starting on 07/17/2018  . [DISCONTINUED] Methylcellulose, Laxative, (CITRUCEL) 500 MG TABS Take 1 tablet by mouth daily as needed (for constipation).   . [DISCONTINUED] metoprolol tartrate (LOPRESSOR) 25 MG tablet Take 12.5 mg by mouth 2 (two) times daily.  . [DISCONTINUED] oxyCODONE (ROXICODONE) 5 MG immediate release tablet Take 1 tablet (5 mg total) by mouth every 8 (eight) hours as needed for severe pain.  . [DISCONTINUED] sertraline (ZOLOFT) 25 MG tablet Take 25 mg by mouth daily.   No facility-administered encounter medications on file as of 08/17/2018.      Review of Systems  Review of Systems  Constitutional: Negative for  activity change, appetite change, chills, diaphoresis, fatigue and fever.  HENT: Negative for mouth sores, postnasal drip, rhinorrhea, sinus pain and sore throat.   Respiratory: Negative for apnea, cough, chest tightness, shortness of breath and wheezing.   Cardiovascular: Negative for chest pain, palpitations and leg swelling.  Gastrointestinal: Negative for abdominal distention, abdominal pain, constipation,  nausea and vomiting.  Genitourinary: Negative for dysuria and frequency.  Musculoskeletal: Negative for arthralgias, joint swelling and myalgias.  Skin: Negative for rash.  Neurological: Negative for dizziness, syncope, weakness, light-headedness and numbness.  Psychiatric/Behavioral: Negative for behavioral problems, confusion and sleep disturbance.     Vitals:   08/17/18 0936  BP: 131/68  Pulse: 78  Resp: 18  Temp: 98 F (36.7 C)  TempSrc: Oral  SpO2: 96%   There is no height or weight on file to calculate BMI. Physical Exam  Constitutional: Oriented to person, place, and time. Well-developed and well-nourished.  HENT:  Head: Normocephalic.  Mouth/Throat: Oropharynx is clear and moist.  Eyes: Pupils are equal, round, and reactive to light.  Neck: Neck supple.  Cardiovascular: Normal rate and normal heart sounds.  No murmur heard. Pulmonary/Chest: Effort normal and breath sounds normal. No respiratory distress. No wheezes. She has no rales.  Abdominal: Soft. Bowel sounds are normal. No distension. There is no tenderness. There is no rebound.  Musculoskeletal: No edema.  Lymphadenopathy: none Neurological: Alert and oriented to person, place, and time.  Skin: Skin is warm and dry.  Psychiatric: Normal mood and affect. Behavior is normal. Thought content normal.    Labs reviewed: Basic Metabolic Panel: Recent Labs    07/23/18 0849 07/24/18 0721 08/15/18 0830  NA 136 135 139  K 5.5* 5.2* 4.2  CL 108 108 108  CO2 17* 17* 19*  GLUCOSE 88 108* 114*  BUN 90*  100* 37*  CREATININE 5.70* 8.02* 3.15*  CALCIUM 8.6* 8.3* 8.7*  Liver Function Tests: Recent Labs    07/19/18 1517 07/21/18 1425  AST 42* 30  ALT 46* 27  ALKPHOS 108 82  BILITOT 0.7 0.5  PROT 7.5 6.0*  ALBUMIN 3.6 2.9*   No results for input(s): LIPASE, AMYLASE in the last 8760 hours. No results for input(s): AMMONIA in the last 8760 hours. CBC: Recent Labs    07/23/18 0849 07/24/18 0721 08/15/18 0830  WBC 5.7 9.3 9.5  NEUTROABS 4.3 7.7 6.2  HGB 7.2* 9.0* 9.8*  HCT 21.8* 26.6* 30.5*  MCV 93.6 90.8 90.2  PLT 251 239 316   Cardiac Enzymes: Recent Labs    07/20/18 1403  TROPONINI 0.03*   BNP: Invalid input(s): POCBNP No results found for: HGBA1C No results found for: TSH No results found for: VITAMINB12 No results found for: FOLATE No results found for: IRON, TIBC, FERRITIN  Imaging and Procedures obtained prior to SNF admission: No results found.  Assessment/Plan  AKI over CKD s/p Nephrostomy Tube and s/p AUS Removal. Patient will follow up with Nephrology Also has appointment with Urology for removal of Nephrostomy Tube  Diarrhea Will Check Stool For C Diff  Acute on Chronic Anemia Patient Last PRBC transfusion was on 09/09 His Hgb has been stable for now Repeat CBC He had work up including Iron studies , B12 and Folate in Hospital and did not need any supplements. H/o PAF Was never started on Any anticoagulant or aspirin due to his Hematuria He has stayed mostly Sinus rhythm by Ziopatch on discharge note. His beta blocker was also stopped due to Bradycardia Follow up with cardiology Depression Was started on Zoloft in this admission  Will follow      Family/ staff Communication:   Labs/tests ordered: Total time spent in this patient care encounter was 45_ minutes; greater than 50% of the visit spent counseling patient, reviewing records , Labs and coordinating care for problems addressed at this encounter.

## 2018-08-18 ENCOUNTER — Other Ambulatory Visit (HOSPITAL_COMMUNITY)
Admission: RE | Admit: 2018-08-18 | Discharge: 2018-08-18 | Disposition: A | Discharge status: Home / Self Care | Payer: MEDICARE | Source: Skilled Nursing Facility | Attending: Internal Medicine | Admitting: Internal Medicine

## 2018-08-18 ENCOUNTER — Non-Acute Institutional Stay (SKILLED_NURSING_FACILITY): Payer: MEDICARE | Admitting: Internal Medicine

## 2018-08-18 ENCOUNTER — Encounter: Payer: Self-pay | Admitting: Internal Medicine

## 2018-08-18 DIAGNOSIS — I480 Paroxysmal atrial fibrillation: Secondary | ICD-10-CM | POA: Diagnosis not present

## 2018-08-18 DIAGNOSIS — N184 Chronic kidney disease, stage 4 (severe): Secondary | ICD-10-CM

## 2018-08-18 DIAGNOSIS — A0472 Enterocolitis due to Clostridium difficile, not specified as recurrent: Secondary | ICD-10-CM | POA: Insufficient documentation

## 2018-08-18 LAB — C DIFFICILE QUICK SCREEN W PCR REFLEX
C Diff antigen: POSITIVE — AB
C Diff interpretation: DETECTED
C Diff toxin: POSITIVE — AB

## 2018-08-18 NOTE — Progress Notes (Signed)
Location:    [facility] Room Number: 130/P Place of Service:  SNF (31) Provider:  Terisa Starr, MD  Patient Care Team: [name], MD as PCP - General (Internal Medicine) [name], MD as Attending Physician (Cardiology)  Extended Emergency Contact Information Primary Emergency Contact: Mayra Neer of [place] Phone: [phone] Relation: Son  Code Status:  DNR Goals of care: Advanced Directive information Advanced Directives 08/18/2018  Does Patient Have a Medical Advance Directive? Yes  Type of Advance Directive Out of facility DNR (pink MOST or yellow form)  Does patient want to make changes to medical advance directive? No - Patient declined  Would patient like information on creating a medical advance directive? No - Patient declined  Pre-existing out of facility DNR order (yellow form or pink MOST form) -     Chief Complaint  Patient presents with  . Acute Visit    Patientis being seen for C-Diff    HPI:  Pt is a 84 y.o. male seen today for an acute visit for follow-up of C. difficile positive culture.  Patient recently returned to facility from an extended hospital stay at [facility]- while there among other issues he was treated for possible UTI with Cipro.   He also continued at times to have hematuria.  He also has a history of stage V chronic kidney disease.  At one point during his hospitalization he had a short bout of atrial fibrillation but apparently this was of short duration.   Apparently yesterday he was noted to have some diarrhea and a C. difficile culture was obtained which has come back positive.  His vital signs are stable he is afebrile and he is denying any abdominal pain   Past Medical History:  Diagnosis Date  . Artificial opening care, other    pt states that he has artifical sphincter, unable to have foley cath  placed.   . Cancer ([place])    prostate ca 1999/removal/ rad tx  . Carotid artery occlusion   . Hyperlipidemia   . Hypertension   . Incontinence   . Reflux   . Renal disorder   . Status post implantation of artificial urinary sphincter    Past Surgical History:  Procedure Laterality Date  . cataracts    . HERNIA REPAIR    . radical prostectomy    . URINARY SPHINCTER IMPLANT      Allergies  Allergen Reactions  . Ivp Dye [Iodinated Diagnostic Agents]     Due to partial renal failure  . Nalbuphine Other (See Comments)    PATIENT STATES IT MADE HIM FEEL LIKE HE WAS GOING TO DIE. NO SPECIFICS  . Codeine Anxiety    Outpatient Encounter Medications as of 08/18/2018  Medication Sig  . acetaminophen (TYLENOL) 500 MG tablet Take 1,000 mg by mouth every 8 (eight) hours as needed for mild pain or moderate pain. For headache pain   . ALFALFA PO Take 250 mg by mouth daily.   Marland Kitchen amLODipine (NORVASC) 5 MG tablet Take 5 mg by mouth daily.  Roseanne Kaufman [place]-Castor Oil (VENELEX) OINT Apply to sacrum and bilateral buttocks qshift and prn.  . fish oil-omega-3 fatty acids 1000 MG capsule Take 1 g by mouth 2 (two) times daily.   . Flaxseed, Linseed, (FLAX SEED OIL) 1000 MG CAPS Take 1 capsule by mouth daily.  . magnesium chloride (SLOW-MAG)  64 MG TBEC SR tablet Take 1 tablet by mouth 2 (two) times daily.  . Multiple Vitamin (MULTIVITAMIN WITH MINERALS) TABS tablet Take 1 tablet by mouth daily.  Marland Kitchen nystatin (NYSTATIN) powder Apply 1,000,000 g topically 2 (two) times daily. Use for 2 weeks from 08/14/2018-08/27/2018  . pantoprazole (PROTONIX) 40 MG tablet Take 40 mg by mouth daily.  . polyethylene glycol (MIRALAX / GLYCOLAX) packet Take 17 g by mouth daily as needed.  . rosuvastatin (CRESTOR) 10 MG tablet Take 10 mg by mouth daily.   . sertraline (ZOLOFT) 100 MG tablet Take 100 mg by mouth daily.  . vitamin B-12 (CYANOCOBALAMIN) 1000 MCG tablet Take 1,000 mcg by mouth daily.   No facility-administered  encounter medications on file as of 08/18/2018.     Review of Systems  In general is not complaining of any fever chills.  Skin does not complain of rashes or itching.  Head ears eyes nose mouth and throat does not complain of visual changes or difficulty swallowing.  Respiratory denies shortness of breath or increased cough  Cardiac denies chest pain has fairly mild lower extremity edema.  GI-does not complain of abdominal pain nausea or vomiting-again he has had some diarrhea positive for C. difficile.  GU is not really complaining of dysuria has a significant history here as noted above.  Musculoskeletal has weakness but is not complaining of joint pain.  Neurologic has weakness but does not complain of dizziness headache or syncope or numbness  Immunization History  Administered Date(s) Administered  . Influenza-Unspecified 09/01/2013   Pertinent  Health Maintenance Due  Topic Date Due  . INFLUENZA VACCINE  08/20/2018 (Originally 07/02/2018)  . PNA vac Low Risk Adult (1 of 2 - PCV13) 08/20/2018 (Originally 09/30/2001)   Fall Risk  07/29/2016  Falls in the past year? No  Comment Emmi Telephone Survey: data to providers prior to load   Functional Status Survey:    Vitals:   08/18/18 1443  BP: 121/62  Pulse: 91  Resp: 19  Temp: 98 F (36.7 C)  TempSrc: Oral  SpO2: 96%  Weight is 165.4 pounds  Physical Exam   In general this is a pleasant elderly male in no distress lying comfortably in bed.  His skin is warm and dry.  Eyes visual acuity appears to be intact he has prescription lenses sclera and conjunctive are clear.  Oropharynx clear mucous membranes moist.  Chest is clear to auscultation with somewhat shallow air entry there is no labored breathing.  Heart is regular rate and rhythm without murmur gallop or rub he has quite mild lower extremity edema this is fairly minimal.  Abdomen is soft does not appear to be tender there are positive bowel sounds  there is no distention  GU at this point does not appear to have significant bleeding but has had that frequently previously patient says this is intermittent.  Musculoskeletal Limited exam since he is in bed but is able to move all extremities x4 is able to turn himself in bed.  Neurologic is grossly intact his speech is clear no true lateralizing findings.  Psych he appears grossly alert and oriented pleasant and appropriate  Labs reviewed: Recent Labs    07/23/18 0849 07/24/18 0721 08/15/18 0830  NA 136 135 139  K 5.5* 5.2* 4.2  CL 108 108 108  CO2 17* 17* 19*  GLUCOSE 88 108* 114*  BUN 90* 100* 37*  CREATININE 5.70* 8.02* 3.15*  CALCIUM 8.6* 8.3* 8.7*   Recent Labs  07/19/18 1517 07/21/18 1425  AST 42* 30  ALT 46* 27  ALKPHOS 108 82  BILITOT 0.7 0.5  PROT 7.5 6.0*  ALBUMIN 3.6 2.9*   Recent Labs    07/23/18 0849 07/24/18 0721 08/15/18 0830  WBC 5.7 9.3 9.5  NEUTROABS 4.3 7.7 6.2  HGB 7.2* 9.0* 9.8*  HCT 21.8* 26.6* 30.5*  MCV 93.6 90.8 90.2  PLT 251 239 316   No results found for: TSH No results found for: HGBA1C Lab Results  Component Value Date   CHOL 146 07/26/2008   HDL 30.3 (L) 07/26/2008   LDLCALC 82 07/26/2008   TRIG 171 (H) 07/26/2008   CHOLHDL 4.8 CALC 07/26/2008    Significant Diagnostic Results in last 30 days:  Ct Abdomen Pelvis Wo Contrast  Result Date: 07/19/2018 CLINICAL DATA:  84 year old male with acute abdominal pain and UTI/sepsis. EXAM: CT ABDOMEN AND PELVIS WITHOUT CONTRAST TECHNIQUE: Multidetector CT imaging of the abdomen and pelvis was performed following the standard protocol without IV contrast. COMPARISON:  02/17/2013 CT and prior studies FINDINGS: Please note that parenchymal abnormalities may be missed without intravenous contrast. Lower chest: No acute abnormality. Hepatobiliary: The liver and gallbladder are unremarkable. No biliary dilatation. Pancreas: Unremarkable Spleen: Unremarkable Adrenals/Urinary Tract: Mild  bilateral hydroureteronephrosis noted to the bladder. A 4 cm filling defect within the bladder noted and may represent a mass. A small amount of gas in the bladder may be post catheterization or infection. The adrenal glands are unremarkable. Stomach/Bowel: Stomach is within normal limits. Appendix appears normal. No evidence of bowel wall thickening, distention, or inflammatory changes. Vascular/Lymphatic: Aortic atherosclerosis. No enlarged abdominal or pelvic lymph nodes. Reproductive: Patient is status post prostatectomy. Urinary sphincter hardware noted. Other: No ascites, focal collection or pneumoperitoneum. Musculoskeletal: No acute or suspicious bony abnormalities. IMPRESSION: 1. 4 cm filling defect within the bladder which may represent mass/malignancy. Urology consultation recommended. Mild bilateral hydroureteronephrosis. 2. Small amount of gas within the bladder-question post catheterization or infection. 3.  Aortic Atherosclerosis (ICD10-I70.0). Electronically Signed   By: Margarette Canada M.D.   On: 07/19/2018 17:33    Assessment/Plan  #1- C. difficile colitis- again he has a stool culture was tested positive- we will treat with vancomycin 125 mg p.o. every 6 hours x10 days and monitor clinically appears to be stable.  2.  History of chronic kidney disease stage V-creatinine of 3.15 on lab done September 14 is slightly above his hospital discharge creatinine but significantly improved from previous creatinines when he was here- updated labs are pending for September 23  #3 history of hematuria with complicated history as noted above- apparently this is somewhat intermittent hemoglobin of 9.8 on most recent lab is relatively stable with what it was running during his previous stay here updated labs again are scheduled for September 23  #4 history of atrial fibrillation apparently had a short course of this during his hospitalization- heart rate is controlled today he does not show evidence of A.  fib will continue to monitor  [phone]

## 2018-08-24 ENCOUNTER — Encounter (HOSPITAL_COMMUNITY)
Admission: RE | Admit: 2018-08-24 | Discharge: 2018-08-24 | Disposition: A | Discharge status: Home / Self Care | Payer: MEDICARE | Source: Skilled Nursing Facility | Attending: Internal Medicine | Admitting: Internal Medicine

## 2018-08-24 DIAGNOSIS — N184 Chronic kidney disease, stage 4 (severe): Secondary | ICD-10-CM | POA: Insufficient documentation

## 2018-08-24 LAB — CBC
HCT: 28.2 % — ABNORMAL LOW (ref 39.0–52.0)
Hemoglobin: 9 g/dL — ABNORMAL LOW (ref 13.0–17.0)
MCH: 28.7 pg (ref 26.0–34.0)
MCHC: 31.9 g/dL (ref 30.0–36.0)
MCV: 89.8 fL (ref 78.0–100.0)
Platelets: 239 10*3/uL (ref 150–400)
RBC: 3.14 MIL/uL — ABNORMAL LOW (ref 4.22–5.81)
RDW: 15.8 % — ABNORMAL HIGH (ref 11.5–15.5)
WBC: 9.6 10*3/uL (ref 4.0–10.5)

## 2018-08-24 LAB — BASIC METABOLIC PANEL
Anion gap: 11 (ref 5–15)
BUN: 37 mg/dL — ABNORMAL HIGH (ref 8–23)
CO2: 18 mmol/L — ABNORMAL LOW (ref 22–32)
Calcium: 8.8 mg/dL — ABNORMAL LOW (ref 8.9–10.3)
Chloride: 110 mmol/L (ref 98–111)
Creatinine, Ser: 2.93 mg/dL — ABNORMAL HIGH (ref 0.61–1.24)
GFR calc Af Amer: 22 mL/min — ABNORMAL LOW (ref >60)
GFR calc non Af Amer: 19 mL/min — ABNORMAL LOW (ref >60)
Glucose, Bld: 137 mg/dL — ABNORMAL HIGH (ref 70–99)
Potassium: 4.7 mmol/L (ref 3.5–5.1)
Sodium: 139 mmol/L (ref 135–145)

## 2018-08-30 ENCOUNTER — Encounter (HOSPITAL_COMMUNITY)
Admission: RE | Admit: 2018-08-30 | Discharge: 2018-08-30 | Disposition: A | Discharge status: Home / Self Care | Payer: MEDICARE | Source: Skilled Nursing Facility | Attending: *Deleted | Admitting: *Deleted

## 2018-08-30 LAB — BASIC METABOLIC PANEL
Anion gap: 9 (ref 5–15)
BUN: 37 mg/dL — ABNORMAL HIGH (ref 8–23)
CO2: 19 mmol/L — ABNORMAL LOW (ref 22–32)
Calcium: 8.9 mg/dL (ref 8.9–10.3)
Chloride: 108 mmol/L (ref 98–111)
Creatinine, Ser: 3.04 mg/dL — ABNORMAL HIGH (ref 0.61–1.24)
GFR calc Af Amer: 21 mL/min — ABNORMAL LOW (ref >60)
GFR calc non Af Amer: 18 mL/min — ABNORMAL LOW (ref >60)
Glucose, Bld: 111 mg/dL — ABNORMAL HIGH (ref 70–99)
Potassium: 4.8 mmol/L (ref 3.5–5.1)
Sodium: 136 mmol/L (ref 135–145)

## 2018-08-30 LAB — CBC WITH DIFFERENTIAL/PLATELET
Basophils Absolute: 0 10*3/uL (ref 0.0–0.1)
Basophils Relative: 0 %
Eosinophils Absolute: 0.2 10*3/uL (ref 0.0–0.7)
Eosinophils Relative: 2 %
HCT: 29.5 % — ABNORMAL LOW (ref 39.0–52.0)
Hemoglobin: 9.3 g/dL — ABNORMAL LOW (ref 13.0–17.0)
Lymphocytes Relative: 9 %
Lymphs Abs: 1.2 10*3/uL (ref 0.7–4.0)
MCH: 28.7 pg (ref 26.0–34.0)
MCHC: 31.5 g/dL (ref 30.0–36.0)
MCV: 91 fL (ref 78.0–100.0)
Monocytes Absolute: 0.8 10*3/uL (ref 0.1–1.0)
Monocytes Relative: 6 %
Neutro Abs: 10.7 10*3/uL — ABNORMAL HIGH (ref 1.7–7.7)
Neutrophils Relative %: 83 %
Platelets: 272 10*3/uL (ref 150–400)
RBC: 3.24 MIL/uL — ABNORMAL LOW (ref 4.22–5.81)
RDW: 15.6 % — ABNORMAL HIGH (ref 11.5–15.5)
WBC: 12.8 10*3/uL — ABNORMAL HIGH (ref 4.0–10.5)

## 2018-08-31 ENCOUNTER — Non-Acute Institutional Stay (SKILLED_NURSING_FACILITY): Payer: MEDICARE | Admitting: Internal Medicine

## 2018-08-31 ENCOUNTER — Encounter: Payer: Self-pay | Admitting: Internal Medicine

## 2018-08-31 DIAGNOSIS — I480 Paroxysmal atrial fibrillation: Secondary | ICD-10-CM

## 2018-08-31 DIAGNOSIS — L03818 Cellulitis of other sites: Secondary | ICD-10-CM | POA: Diagnosis not present

## 2018-08-31 DIAGNOSIS — N184 Chronic kidney disease, stage 4 (severe): Secondary | ICD-10-CM | POA: Diagnosis not present

## 2018-08-31 NOTE — Progress Notes (Signed)
Location:    [facility] Room Number: 130/P Place of Service:  SNF (31) Provider:  Veleta Miners MD  [name], MD  Patient Care Team: [name], MD as PCP - General (Internal Medicine) [name], MD as Attending Physician (Cardiology)  Extended Emergency Contact Information Primary Emergency Contact: [name], [name] of [place] Phone: [phone] Relation: Son  Code Status:  DNR Goals of care: Advanced Directive information Advanced Directives 08/31/2018  Does Patient Have a Medical Advance Directive? Yes  Type of Advance Directive Out of facility DNR (pink MOST or yellow form)  Does patient want to make changes to medical advance directive? No - Patient declined  Would patient like information on creating a medical advance directive? No - Patient declined  Pre-existing out of facility DNR order (yellow form or pink MOST form) -     Chief Complaint  Patient presents with  . Acute Visit    Patient is being seen for Dehised scrotal  Skin and draining from the Nephrostomy Tube site    HPI:  Pt is a 84 y.o. male seen today for an acute visit for Possible Infection around the Nephrostomy Tube and Scrotal Cellulitis. admission to SNF after staying in the hospital and Then ACE unit in [facility] from 08/23-09/13 for AKI and Bladder outlet Obstruction  Patient has h/o GERD, hyperlipidemia, HTN, CKD stage IV, and prostate cancer s/p radical prostatectomy with adjuvant radiation and artifical urinary sphincter (AUS) placement in 2012  He was initially Hospitalized for E Coli Bacteremia from 08/07-08/16 in setting of Bladder outlet Obstruction from Blood Clots .Due to Hemorrhagic Cystitis. And PAF with RVR  He was again admitted due to Hematuria and Worsenign Renal function.With Symptomatic Uremia.He had bilateral Nephrostomy Tube placed on 09/07 Since then patient Creat improved and came back to  baseline. He also had Anemia thought to be due to Hematuria requiring 5 PRBC transfusion.  His last PRBC transfusion was on 09/09. Right lower extremity swelling was noted venous Dopplers were negative for any DVT Patient did have paroxysmal A. fib in the previous hospitalization but due to hematuria he was never started on any anticoagulation.  He was started on beta-blocker but it was discontinued due to bradycardia.  There was also concern for Parkinson disease and he was given a trial of Sinemet.  Eventually his Abilify was discontinued and he was started on Zoloft increasing to 100 mg. Patient was also diagnosed with C Diff in the facility and finished 10 days of Oral Vanco. Over the weekends Nurses noticed worsening Discharge from his Nephrostomy Tube Site. They also noticed some redness and Discharge from Base of his Scrotum with Skin Dehiscence. Patient denies any Pain or Fever or Chills. He had few Blisters on his skin which are better now. Patient also Had fall today. He just Lost his balance. He was bleeding from his Head Wound   Past Medical History:  Diagnosis Date  . 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   . Cancer ([place])    prostate ca 1999/removal/ rad tx  . Carotid artery occlusion   . Hyperlipidemia   . Hypertension   . Incontinence   . Reflux   . Renal disorder   . Status post implantation of artificial urinary sphincter    Past Surgical History:  Procedure Laterality Date  . cataracts    .  HERNIA REPAIR    . radical prostectomy    . URINARY SPHINCTER IMPLANT      Allergies  Allergen Reactions  . Ivp Dye [Iodinated Diagnostic Agents]     Due to partial renal failure  . Nalbuphine Other (See Comments)    PATIENT STATES IT MADE HIM FEEL LIKE HE WAS GOING TO DIE. NO SPECIFICS  . Codeine Anxiety    Outpatient Encounter Medications as of 08/31/2018  Medication Sig  . acetaminophen  (TYLENOL) 500 MG tablet Take 1,000 mg by mouth every 8 (eight) hours as needed for mild pain or moderate pain. For headache pain   . ALFALFA PO Take 250 mg by mouth daily.   Marland Kitchen amLODipine (NORVASC) 5 MG tablet Take 5 mg by mouth daily.  Roseanne Kaufman Peru-Castor Oil (VENELEX) OINT Apply to sacrum and bilateral buttocks qshift and prn.  . doxycycline (VIBRAMYCIN) 100 MG capsule Take 200 mg by mouth 2 (two) times daily.  . fish oil-omega-3 fatty acids 1000 MG capsule Take 1 g by mouth 2 (two) times daily.   . Flaxseed, Linseed, (FLAX SEED OIL) 1000 MG CAPS Take 1 capsule by mouth daily.  . magnesium chloride (SLOW-MAG) 64 MG TBEC SR tablet Take 1 tablet by mouth 2 (two) times daily.  . Multiple Vitamin (MULTIVITAMIN WITH MINERALS) TABS tablet Take 1 tablet by mouth daily.  . pantoprazole (PROTONIX) 40 MG tablet Take 40 mg by mouth daily.  . polyethylene glycol (MIRALAX / GLYCOLAX) packet Take 17 g by mouth daily as needed.  . Probiotic Product (RISA-BID PROBIOTIC PO) Take 1 tablet by mouth twice a day  . rosuvastatin (CRESTOR) 10 MG tablet Take 10 mg by mouth daily.   . sertraline (ZOLOFT) 100 MG tablet Take 100 mg by mouth daily.  . vitamin B-12 (CYANOCOBALAMIN) 1000 MCG tablet Take 1,000 mcg by mouth daily.  . [DISCONTINUED] nystatin (NYSTATIN) powder Apply 1,000,000 g topically 2 (two) times daily. Use for 2 weeks from 08/14/2018-08/27/2018   No facility-administered encounter medications on file as of 08/31/2018.      Review of Systems  Constitutional: Negative for chills and fever.  HENT: Negative.   Respiratory: Negative.   Cardiovascular: Negative.   Gastrointestinal: Negative.   Genitourinary: Negative.   Musculoskeletal: Negative.   Skin: Positive for wound.  Neurological: Negative.   Psychiatric/Behavioral: Negative.     Immunization History  Administered Date(s) Administered  . Influenza-Unspecified 09/01/2013   Pertinent  Health Maintenance Due  Topic Date Due  . INFLUENZA  VACCINE  09/30/2018 (Originally 07/02/2018)  . PNA vac Low Risk Adult (1 of 2 - PCV13) 09/30/2018 (Originally 09/30/2001)   Fall Risk  07/29/2016  Falls in the past year? No  Comment Emmi Telephone Survey: data to providers prior to load   Functional Status Survey:    Vitals:   08/31/18 1253  BP: 114/60  Pulse: 72  Resp: 18  Temp: 97.6 F (36.4 C)  TempSrc: Oral  SpO2: 100%   There is no height or weight on file to calculate BMI. Physical Exam  Constitutional: He is oriented to person, place, and time. He appears well-developed and well-nourished.  HENT:  Head: Normocephalic.  Mouth/Throat: Oropharynx is clear and moist.  Has small laceration Near Left Eye. Bleeding stopped with Pressure  Eyes: Pupils are equal, round, and reactive to light.  Neck: Neck supple.  Cardiovascular: Normal rate and regular rhythm.  Pulmonary/Chest: Effort normal and breath sounds normal. No stridor. No respiratory distress. He has no wheezes.  Genitourinary:  Genitourinary Comments: Had skin lacerated near the  Base of Scrotum. No Discharge or redness. Not Tender or Swollen He also has redness and discharge around the Nephrostomy Tubes  Musculoskeletal:  Mild edema  Neurological: He is alert and oriented to person, place, and time.  Skin: Skin is warm and dry.  Has few Blisters scattered. Healing and dont seem infected. Does have Fungal rash in Groin area Also some redness in Sacral Area  Psychiatric: He has a normal mood and affect. His behavior is normal. Thought content normal.    Labs reviewed: Recent Labs    08/15/18 0830 08/24/18 0746 08/30/18 1130  NA 139 139 136  K 4.2 4.7 4.8  CL 108 110 108  CO2 19* 18* 19*  GLUCOSE 114* 137* 111*  BUN 37* 37* 37*  CREATININE 3.15* 2.93* 3.04*  CALCIUM 8.7* 8.8* 8.9   Recent Labs    07/19/18 1517 07/21/18 1425  AST 42* 30  ALT 46* 27  ALKPHOS 108 82  BILITOT 0.7 0.5  PROT 7.5 6.0*  ALBUMIN 3.6 2.9*   Recent Labs     07/24/18 0721 08/15/18 0830 08/24/18 0746 08/30/18 1130  WBC 9.3 9.5 9.6 12.8*  NEUTROABS 7.7 6.2  --  10.7*  HGB 9.0* 9.8* 9.0* 9.3*  HCT 26.6* 30.5* 28.2* 29.5*  MCV 90.8 90.2 89.8 91.0  PLT 239 316 239 272   No results found for: TSH No results found for: HGBA1C Lab Results  Component Value Date   CHOL 146 07/26/2008   HDL 30.3 (L) 07/26/2008   LDLCALC 82 07/26/2008   TRIG 171 (H) 07/26/2008   CHOLHDL 4.8 CALC 07/26/2008    Significant Diagnostic Results in last 30 days:  No results found.  Assessment/Plan Infection around the Nephrostomy Tubes He is afebrile but has Increased White Count Will Start him on Doxycyline for 7 days Follow up with Urology on Oct 3rd And with IR on Oct 21st for removal of tubes Scrotal Skin Laceration No treatment right now Head laceration Neuro Checks And steri strips for now No Stiches required C Diff Colitis Resolved with Oral Vanc Acute on Chronic Anemia Patient Last PRBC transfusion was on 09/09 His Hgb has been stable for now He had work up including Iron studies , B12 and Folate in Hospital and did not need any supplements. H/o PAF Was never started on Any anticoagulant or aspirin due to his Hematuria He has stayed mostly Sinus rhythm by Ziopatch on discharge note. His beta blocker was also stopped due to Bradycardia Follow up with cardiology Worsening CKD with Acidosis Has follow up with Renal in few weeks Will start him on Bicarbonate Depression Was started on Zoloft in this admission  Doing well Walks with the walker around in Facility    Family/ staff Communication:   Labs/tests ordered:

## 2018-09-01 DIAGNOSIS — R279 Unspecified lack of coordination: Secondary | ICD-10-CM | POA: Diagnosis not present

## 2018-09-01 DIAGNOSIS — N393 Stress incontinence (female) (male): Secondary | ICD-10-CM | POA: Diagnosis not present

## 2018-09-01 DIAGNOSIS — D631 Anemia in chronic kidney disease: Secondary | ICD-10-CM | POA: Diagnosis not present

## 2018-09-01 DIAGNOSIS — A0472 Enterocolitis due to Clostridium difficile, not specified as recurrent: Secondary | ICD-10-CM | POA: Diagnosis not present

## 2018-09-01 DIAGNOSIS — R262 Difficulty in walking, not elsewhere classified: Secondary | ICD-10-CM | POA: Diagnosis not present

## 2018-09-01 DIAGNOSIS — R310 Gross hematuria: Secondary | ICD-10-CM | POA: Diagnosis not present

## 2018-09-01 DIAGNOSIS — N184 Chronic kidney disease, stage 4 (severe): Secondary | ICD-10-CM | POA: Diagnosis not present

## 2018-09-01 DIAGNOSIS — I480 Paroxysmal atrial fibrillation: Secondary | ICD-10-CM | POA: Diagnosis not present

## 2018-09-01 DIAGNOSIS — R600 Localized edema: Secondary | ICD-10-CM | POA: Diagnosis not present

## 2018-09-01 DIAGNOSIS — Z936 Other artificial openings of urinary tract status: Secondary | ICD-10-CM | POA: Diagnosis not present

## 2018-09-01 DIAGNOSIS — N289 Disorder of kidney and ureter, unspecified: Secondary | ICD-10-CM | POA: Diagnosis not present

## 2018-09-02 LAB — AEROBIC CULTURE W GRAM STAIN (SUPERFICIAL SPECIMEN)
Gram Stain: NONE SEEN
Gram Stain: NONE SEEN

## 2018-09-02 LAB — AEROBIC CULTURE  (SUPERFICIAL SPECIMEN)

## 2018-09-03 ENCOUNTER — Non-Acute Institutional Stay (SKILLED_NURSING_FACILITY): Payer: MEDICARE | Admitting: Internal Medicine

## 2018-09-03 ENCOUNTER — Encounter: Payer: Self-pay | Admitting: Internal Medicine

## 2018-09-03 DIAGNOSIS — I480 Paroxysmal atrial fibrillation: Secondary | ICD-10-CM | POA: Diagnosis not present

## 2018-09-03 DIAGNOSIS — N184 Chronic kidney disease, stage 4 (severe): Secondary | ICD-10-CM | POA: Diagnosis not present

## 2018-09-03 DIAGNOSIS — N393 Stress incontinence (female) (male): Secondary | ICD-10-CM | POA: Diagnosis not present

## 2018-09-03 DIAGNOSIS — Z936 Other artificial openings of urinary tract status: Secondary | ICD-10-CM | POA: Diagnosis not present

## 2018-09-03 DIAGNOSIS — D631 Anemia in chronic kidney disease: Secondary | ICD-10-CM | POA: Diagnosis not present

## 2018-09-03 DIAGNOSIS — R310 Gross hematuria: Secondary | ICD-10-CM | POA: Diagnosis not present

## 2018-09-03 DIAGNOSIS — N289 Disorder of kidney and ureter, unspecified: Secondary | ICD-10-CM | POA: Diagnosis not present

## 2018-09-03 NOTE — Progress Notes (Signed)
Location:    [place] Room Number: 130/P Place of Service:  SNF (31)  Provider: Granville Lewis PA-C  PCP: [name], MD Patient Care Team: [name], MD as PCP - General (Internal Medicine) [name], MD as Attending Physician (Cardiology)  Extended Emergency Contact Information Primary Emergency Contact: [name], [name] of [place] Phone: [phone] Relation: Son  Code Status: DNR Goals of care:  Advanced Directive information Advanced Directives 09/03/2018  Does Patient Have a Medical Advance Directive? Yes  Type of Advance Directive Out of facility DNR (pink MOST or yellow form)  Does patient want to make changes to medical advance directive? No - Patient declined  Would patient like information on creating a medical advance directive? No - Patient declined  Pre-existing out of facility DNR order (yellow form or pink MOST form) -     Allergies  Allergen Reactions  . Ivp Dye [Iodinated Diagnostic Agents]     Due to partial renal failure  . Nalbuphine Other (See Comments)    PATIENT STATES IT MADE HIM FEEL LIKE HE WAS GOING TO DIE. NO SPECIFICS  . Codeine Anxiety    Chief Complaint  Patient presents with  . Discharge Note    Discharge Visit    HPI:  84 y.o. male   seen today for discharge from facility this weekend.  Patient was admitted here after hospitalization at [facility] from August 23 to September 13 for acute kidney injury and bladder outlet obstruction.   He has a history of GERD hyperlipidemia hypertension chronic kidney disease stage IV and prostate cancer status post radical prostatectomy with radiation and artificial urinary sphincter placement back in 2012.  Initially was hospitalized for E. coli bacteremia in early August in the setting of bladder outlet obstruction from blood clots because of hemorrhagic cystitis- he also had atrial fibrillation with rapid  ventricular weight.  He was discharged to skilled nursing but was again admitted because of hematuria and worsening renal function with symptomatic uremia- he had bilateral nephrostomy tubes placed on September 7  Since then his renal function has improved creatinine is come back to baseline which appears to be around 3.  He also had anemia thought due to the hematuria and required 5 minutes of packed red blood cells.  He did have right lower extremity edema but Doppler was negative for any DVT.  He does have a history of A. fib but has not been started on anticoagulation because of his hematuria- beta-blocker was attempted but was discontinued because of bradycardia.  He also received a trial of Sinemet for possible Parkinson syndromes.  He also has a diagnosis of depression and Zoloft was increased- Abilify was discontinued.  Since his return to skilled nursing he did have diarrhea and C. difficile culture was positive he is completed a 10-day course of vancomycin and this appears to be resolved.  He was seen earlier this week for a fall where he obtained a small laceration of his left temple area he currently has Steri-Strips and this appears to be stable.  He also developed some blisters in the lower abdominal groin area that were diffuse- drainage from nephrostomy tube sites.  He was started on doxycycline and this appears to be improving any exudate is quite minimal and largely resolved- he does have some open areas on his groin which appear to be opening up blisters and slowly resolving-.  Culture  did come back showing some MRSA and E. coli- this was evaluated by Dr. Lyndel Safe and will continue the doxycycline for short course secondary to these areas improving.  He actually did see urology today and thought to be stable- they did evaluate groin area and thought there was no significant sign of infection.  He is slated to have nephrostomy tubes removed later this month  Currently has  no complaints he says he still feels somewhat weak but has gained strength is now ambulating with a walker in the hallway which is certainly an improvement.  He does not complain of any shortness of breath fever chills- nephrostomy tubes appear to be working well.        Past Medical History:  Diagnosis Date  . 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   . Cancer ([place])    prostate ca 1999/removal/ rad tx  . Carotid artery occlusion   . Hyperlipidemia   . Hypertension   . Incontinence   . Reflux   . Renal disorder   . Status post implantation of artificial urinary sphincter     Past Surgical History:  Procedure Laterality Date  . cataracts    . HERNIA REPAIR    . radical prostectomy    . URINARY SPHINCTER IMPLANT        reports that he quit smoking about 41 years ago. His smoking use included cigarettes. He started smoking about 56 years ago. He smoked 2.00 packs per day. He has never used smokeless tobacco. He reports that he does not drink alcohol or use drugs. Social History   Socioeconomic History  . Marital status: Widowed    Spouse name: Not on file  . Number of children: Not on file  . Years of education: Not on file  . Highest education level: Not on file  Occupational History  . Occupation: retired  Scientific laboratory technician  . Financial resource strain: Not on file  . Food insecurity:    Worry: Not on file    Inability: Not on file  . Transportation needs:    Medical: Not on file    Non-medical: Not on file  Tobacco Use  . Smoking status: Former Smoker    Packs/day: 2.00    Types: Cigarettes    Start date: 08/05/1962    Last attempt to quit: 12/02/1976    Years since quitting: 41.7  . Smokeless tobacco: Never Used  Substance and Sexual Activity  . Alcohol use: No  . Drug use: No  . Sexual activity: Not on file  Lifestyle  . Physical activity:    Days per week: Not on file    Minutes per  session: Not on file  . Stress: Not on file  Relationships  . Social connections:    Talks on phone: Not on file    Gets together: Not on file    Attends religious service: Not on file    Active member of club or organization: Not on file    Attends meetings of clubs or organizations: Not on file    Relationship status: Not on file  . Intimate partner violence:    Fear of current or ex partner: Not on file    Emotionally abused: Not on file    Physically abused: Not on file    Forced sexual activity: Not on file  Other Topics Concern  . Not on file  Social History Narrative  .  Not on file   Functional Status Survey:    Allergies  Allergen Reactions  . Ivp Dye [Iodinated Diagnostic Agents]     Due to partial renal failure  . Nalbuphine Other (See Comments)    PATIENT STATES IT MADE HIM FEEL LIKE HE WAS GOING TO DIE. NO SPECIFICS  . Codeine Anxiety    Pertinent  Health Maintenance Due  Topic Date Due  . INFLUENZA VACCINE  09/30/2018 (Originally 07/02/2018)  . PNA vac Low Risk Adult (1 of 2 - PCV13) 09/30/2018 (Originally 09/30/2001)    Medications: Outpatient Encounter Medications as of 09/03/2018  Medication Sig  . acetaminophen (TYLENOL) 500 MG tablet Take 1,000 mg by mouth every 8 (eight) hours as needed for mild pain or moderate pain. For headache pain   . ALFALFA PO Take 250 mg by mouth daily.   Marland Kitchen amLODipine (NORVASC) 5 MG tablet Take 5 mg by mouth daily.  Marland Kitchen doxycycline (VIBRAMYCIN) 100 MG capsule Take 100 mg by mouth 2 (two) times daily.   . fish oil-omega-3 fatty acids 1000 MG capsule Take 1 g by mouth 2 (two) times daily.   . Flaxseed, Linseed, (FLAX SEED OIL) 1000 MG CAPS Take 1 capsule by mouth daily.  . Influenza Vac Split Quad (FLUARIX QUADRIVALENT IM) Inject 0.5 mLs into the muscle once.  . magnesium chloride (SLOW-MAG) 64 MG TBEC SR tablet Take 1 tablet by mouth 2 (two) times daily.  . Multiple Vitamin (MULTIVITAMIN WITH MINERALS) TABS tablet Take 1 tablet by  mouth daily.  Marland Kitchen nystatin (NYSTATIN) powder Apply to groin area for protection every shift from 08/31/2018-09/07/2018  . pantoprazole (PROTONIX) 40 MG tablet Take 40 mg by mouth daily.  . polyethylene glycol (MIRALAX / GLYCOLAX) packet Take 17 g by mouth daily as needed.  . Probiotic Product (RISA-BID PROBIOTIC PO) Take 1 tablet by mouth twice a day  . rosuvastatin (CRESTOR) 10 MG tablet Take 10 mg by mouth daily.   . sertraline (ZOLOFT) 100 MG tablet Take 100 mg by mouth daily.  . sodium bicarbonate 650 MG tablet Take 650 mg by mouth 2 (two) times daily.  . vitamin B-12 (CYANOCOBALAMIN) 1000 MCG tablet Take 1,000 mcg by mouth daily.  . [DISCONTINUED] Balsam [place]-Castor Oil (VENELEX) OINT Apply to sacrum and bilateral buttocks qshift and prn.   No facility-administered encounter medications on file as of 09/03/2018.      Review of Systems   In general is not complaining of any fever chills.  Skin is not complain of rashes or itching-he does have scattered skin issues with nephrostomy tube sites lower abdominal and groin area as noted above but these appear to be improving and resolving  Head ears eyes nose mouth and throat is not complaining of visual changes sore throat or difficulty swallowing.  Respiratory does not complain of shortness of breath or cough.  Cardiac denies chest pain appears to have his baseline mild lower extremity edema.  GI is not complaining of abdominal pain nausea vomiting diarrhea or constipation.  GU does have nephrostomy tubes which appear to be functioning well does not really complain of suprapubic pain.  Musculoskeletal does complain of weakness but is not complaining of joint pain.  Neurologic does not complain of numbness syncope or headache or dizziness does complain of feeling weak.  Psych does not complain of being depressed or anxious appears to be in better spirits although still has somewhat of a flat affect   He is afebrile pulse of 90  respirations of 18  blood pressure taken manually 128/60 Physical Exam   In general this is a pleasant elderly male who actually appears stronger than when I recently saw him.  He is in no distress.  His skin is warm and dry he does have Steri-Strips over left eyes status post small laceration I do not see any sign of infection surrounding erythema or drainage.  He also has what appears to be dried up blisters lower abdominal area bilaterally extending a bit into the groin area.  Scrotum does have some small open areas without any exudate per nursing these appear to be improving as well.  Also nephrostomy tubes he has no drainage left tube very scant drainage around the right tube- no overt signs of significant infection per nursing this has improved as well.  He does have some flaking of his scalp  Eyes visual acuity appears to be intact sclera and conjunctive are clear.  Oropharynx is clear mucous membranes moist.  Chest is clear to auscultation there is no labored breathing.  Heart is regular rate and rhythm without murmur gallop or rub he continues to have mild baseline lower extremity edema.  Abdomen is soft nontender with positive bowel sounds.  Muscle skeletal is able to stand and walk although he has some weakness he does well with a walker strength appears to be relatively intact all extremities  Neurologic is grossly intact cannot really appreciate lateralizing findings his speech is clear although somewhat slow.  Psych continues to be pleasant and appropriate he does speak slowly thought content appears to be normal.    Labs reviewed: Basic Metabolic Panel: Recent Labs    08/15/18 0830 08/24/18 0746 08/30/18 1130  NA 139 139 136  K 4.2 4.7 4.8  CL 108 110 108  CO2 19* 18* 19*  GLUCOSE 114* 137* 111*  BUN 37* 37* 37*  CREATININE 3.15* 2.93* 3.04*  CALCIUM 8.7* 8.8* 8.9   Liver Function Tests: Recent Labs    07/19/18 1517 07/21/18 1425  AST 42* 30  ALT  46* 27  ALKPHOS 108 82  BILITOT 0.7 0.5  PROT 7.5 6.0*  ALBUMIN 3.6 2.9*   No results for input(s): LIPASE, AMYLASE in the last 8760 hours. No results for input(s): AMMONIA in the last 8760 hours. CBC: Recent Labs    07/24/18 0721 08/15/18 0830 08/24/18 0746 08/30/18 1130  WBC 9.3 9.5 9.6 12.8*  NEUTROABS 7.7 6.2  --  10.7*  HGB 9.0* 9.8* 9.0* 9.3*  HCT 26.6* 30.5* 28.2* 29.5*  MCV 90.8 90.2 89.8 91.0  PLT 239 316 239 272   Cardiac Enzymes: Recent Labs    07/20/18 1403  TROPONINI 0.03*   BNP: Invalid input(s): POCBNP CBG: No results for input(s): GLUCAP in the last 8760 hours.  Procedures and Imaging Studies During Stay: No results found.  Assessment/Plan:    #1 history of acute on chronic kidney disease status post nephrostomy tubes bilaterally and AUS removal--with previous history of gross hematuria- he appears to be stable in this regards he actually saw urology today and thought to be stable-he is slated for removal of the nephrostomy tube later this month- he will need again expedient follow-up with urology. His creatinine appears to be relatively baseline on lab done on September 29 at 3.04 update labs are pending for next week-with primary care provider any nephrology notified of results  He does have nephrology's follow-up next month.   2.  History of infection around nephrostomy tube-he is completing a course of doxycycline this  appears stable drainage is largely resolved he did see urology today and thought to be stable.  3.  History of lower abdominal and groin blisters- this as well appears to be resolving -- a culture was done which did grow out some MRSA and E. coli- with resistance to tetracycline nonetheless this appears to be improving----.  4.-  History of C. difficile this appears to have resolved status post course of oral vancomycin.  5- atrial fibrillation not on anticoagulation secondary to history of hematuria not a candidate for beta-blocker  because of bradycardia- this nonetheless appears to be well controlled he has had a Ziopatch  #6- acute on chronic anemia-he did receive a transfusion during his last hospitalization- his hemoglobin appears to have stabilized it was 9.3 on lab done on September 29- he did have a work-up in the hospital that included iron studies B12 and folate and thought not to need any supplementation  #7 chronic kidney disease with Mervin Hack has been started on sodium bicarb with follow-up with nephrology scheduled- he will have a BMP next week as well.  8.  History of depression he continues on Zoloft he appears to be doing a bit better now.  9.  History of hypertension he is on Norvasc this appears stable blood pressure today was 12/29/1958 previous blood pressures 122/58- 131/76-115/67.  10.  History of hyperlipidemia he is on Crestor will defer follow-up to primary care provider.  11.  History of scalp flaking- seborrheic dermatitis- will treat with topical shampoo   Again he will have labs next week including a CBC and BMP primary care provider and nephrology notified- also will have nephrology and urology follow-up as well as primary care provider follow-up  He will need PT OT as well as home health support.  [id number] note greater than 30 minutes spent on this discharge summary- greater than 50% of time spent coordinating a plan of care for numerous diagnoses     :

## 2018-09-04 ENCOUNTER — Encounter (HOSPITAL_COMMUNITY)
Admission: RE | Admit: 2018-09-04 | Discharge: 2018-09-04 | Disposition: A | Discharge status: Home / Self Care | Payer: MEDICARE | Source: Skilled Nursing Facility | Attending: Internal Medicine | Admitting: Internal Medicine

## 2018-09-04 DIAGNOSIS — E785 Hyperlipidemia, unspecified: Secondary | ICD-10-CM | POA: Insufficient documentation

## 2018-09-04 DIAGNOSIS — A0472 Enterocolitis due to Clostridium difficile, not specified as recurrent: Secondary | ICD-10-CM | POA: Insufficient documentation

## 2018-09-04 DIAGNOSIS — C61 Malignant neoplasm of prostate: Secondary | ICD-10-CM | POA: Insufficient documentation

## 2018-09-04 DIAGNOSIS — D6489 Other specified anemias: Secondary | ICD-10-CM | POA: Insufficient documentation

## 2018-09-04 DIAGNOSIS — N184 Chronic kidney disease, stage 4 (severe): Secondary | ICD-10-CM | POA: Insufficient documentation

## 2018-09-04 LAB — RENAL FUNCTION PANEL
Albumin: 2.7 g/dL — ABNORMAL LOW (ref 3.5–5.0)
Anion gap: 8 (ref 5–15)
BUN: 43 mg/dL — ABNORMAL HIGH (ref 8–23)
CO2: 21 mmol/L — ABNORMAL LOW (ref 22–32)
Calcium: 8.8 mg/dL — ABNORMAL LOW (ref 8.9–10.3)
Chloride: 111 mmol/L (ref 98–111)
Creatinine, Ser: 2.93 mg/dL — ABNORMAL HIGH (ref 0.61–1.24)
GFR calc Af Amer: 22 mL/min — ABNORMAL LOW (ref >60)
GFR calc non Af Amer: 19 mL/min — ABNORMAL LOW (ref >60)
Glucose, Bld: 96 mg/dL (ref 70–99)
Phosphorus: 3.8 mg/dL (ref 2.5–4.6)
Potassium: 4.2 mmol/L (ref 3.5–5.1)
Sodium: 140 mmol/L (ref 135–145)

## 2018-09-04 LAB — IRON AND TIBC
Iron: 34 ug/dL — ABNORMAL LOW (ref 45–182)
Saturation Ratios: 19 % (ref 17.9–39.5)
TIBC: 180 ug/dL — ABNORMAL LOW (ref 250–450)
UIBC: 146 ug/dL

## 2018-09-04 LAB — HEMOGLOBIN AND HEMATOCRIT, BLOOD
HCT: 26 % — ABNORMAL LOW (ref 39.0–52.0)
Hemoglobin: 8.3 g/dL — ABNORMAL LOW (ref 13.0–17.0)

## 2018-09-04 LAB — FERRITIN: Ferritin: 502 ng/mL — ABNORMAL HIGH (ref 24–336)

## 2018-09-05 LAB — PTH, INTACT AND CALCIUM
Calcium, Total (PTH): 8.8 mg/dL (ref 8.6–10.2)
PTH: 28 pg/mL (ref 15–65)

## 2018-09-05 LAB — VITAMIN D 25 HYDROXY (VIT D DEFICIENCY, FRACTURES): Vit D, 25-Hydroxy: 37 ng/mL (ref 30.0–100.0)

## 2018-09-06 DIAGNOSIS — L89150 Pressure ulcer of sacral region, unstageable: Secondary | ICD-10-CM | POA: Diagnosis not present

## 2018-09-06 DIAGNOSIS — K219 Gastro-esophageal reflux disease without esophagitis: Secondary | ICD-10-CM | POA: Diagnosis not present

## 2018-09-06 DIAGNOSIS — G629 Polyneuropathy, unspecified: Secondary | ICD-10-CM | POA: Diagnosis not present

## 2018-09-06 DIAGNOSIS — N184 Chronic kidney disease, stage 4 (severe): Secondary | ICD-10-CM | POA: Diagnosis not present

## 2018-09-06 DIAGNOSIS — E440 Moderate protein-calorie malnutrition: Secondary | ICD-10-CM | POA: Diagnosis not present

## 2018-09-06 DIAGNOSIS — Z466 Encounter for fitting and adjustment of urinary device: Secondary | ICD-10-CM | POA: Diagnosis not present

## 2018-09-06 DIAGNOSIS — Z436 Encounter for attention to other artificial openings of urinary tract: Secondary | ICD-10-CM | POA: Diagnosis not present

## 2018-09-06 DIAGNOSIS — C61 Malignant neoplasm of prostate: Secondary | ICD-10-CM | POA: Diagnosis not present

## 2018-09-06 DIAGNOSIS — I480 Paroxysmal atrial fibrillation: Secondary | ICD-10-CM | POA: Diagnosis not present

## 2018-09-06 DIAGNOSIS — I5031 Acute diastolic (congestive) heart failure: Secondary | ICD-10-CM | POA: Diagnosis not present

## 2018-09-06 DIAGNOSIS — D631 Anemia in chronic kidney disease: Secondary | ICD-10-CM | POA: Diagnosis not present

## 2018-09-06 DIAGNOSIS — N3001 Acute cystitis with hematuria: Secondary | ICD-10-CM | POA: Diagnosis not present

## 2018-09-06 DIAGNOSIS — I130 Hypertensive heart and chronic kidney disease with heart failure and stage 1 through stage 4 chronic kidney disease, or unspecified chronic kidney disease: Secondary | ICD-10-CM | POA: Diagnosis not present

## 2018-09-07 ENCOUNTER — Inpatient Hospital Stay (HOSPITAL_COMMUNITY)
Admission: EM | Admit: 2018-09-07 | Discharge: 2018-09-10 | DRG: 683 | Disposition: A | Discharge status: Skilled Nursing Facility | Payer: MEDICARE | Attending: Pulmonary Disease | Admitting: Pulmonary Disease

## 2018-09-07 ENCOUNTER — Encounter (HOSPITAL_COMMUNITY)
Admission: RE | Admit: 2018-09-07 | Discharge: 2018-09-07 | Disposition: A | Discharge status: Home / Self Care | Payer: MEDICARE | Source: Skilled Nursing Facility | Attending: Internal Medicine | Admitting: Internal Medicine

## 2018-09-07 ENCOUNTER — Emergency Department (HOSPITAL_COMMUNITY): Payer: MEDICARE

## 2018-09-07 ENCOUNTER — Encounter (HOSPITAL_COMMUNITY): Payer: Self-pay

## 2018-09-07 ENCOUNTER — Observation Stay (HOSPITAL_COMMUNITY): Payer: MEDICARE

## 2018-09-07 ENCOUNTER — Other Ambulatory Visit: Payer: Self-pay

## 2018-09-07 DIAGNOSIS — R0902 Hypoxemia: Secondary | ICD-10-CM | POA: Diagnosis not present

## 2018-09-07 DIAGNOSIS — K110 Atrophy of salivary gland: Secondary | ICD-10-CM | POA: Diagnosis present

## 2018-09-07 DIAGNOSIS — I129 Hypertensive chronic kidney disease with stage 1 through stage 4 chronic kidney disease, or unspecified chronic kidney disease: Secondary | ICD-10-CM | POA: Diagnosis present

## 2018-09-07 DIAGNOSIS — F329 Major depressive disorder, single episode, unspecified: Secondary | ICD-10-CM | POA: Diagnosis present

## 2018-09-07 DIAGNOSIS — S50311A Abrasion of right elbow, initial encounter: Secondary | ICD-10-CM | POA: Diagnosis not present

## 2018-09-07 DIAGNOSIS — N179 Acute kidney failure, unspecified: Secondary | ICD-10-CM | POA: Diagnosis present

## 2018-09-07 DIAGNOSIS — E860 Dehydration: Secondary | ICD-10-CM | POA: Diagnosis not present

## 2018-09-07 DIAGNOSIS — Z841 Family history of disorders of kidney and ureter: Secondary | ICD-10-CM

## 2018-09-07 DIAGNOSIS — R55 Syncope and collapse: Secondary | ICD-10-CM | POA: Diagnosis present

## 2018-09-07 DIAGNOSIS — D631 Anemia in chronic kidney disease: Secondary | ICD-10-CM

## 2018-09-07 DIAGNOSIS — R42 Dizziness and giddiness: Secondary | ICD-10-CM | POA: Diagnosis not present

## 2018-09-07 DIAGNOSIS — D649 Anemia, unspecified: Secondary | ICD-10-CM | POA: Diagnosis present

## 2018-09-07 DIAGNOSIS — N390 Urinary tract infection, site not specified: Secondary | ICD-10-CM | POA: Diagnosis present

## 2018-09-07 DIAGNOSIS — Z9849 Cataract extraction status, unspecified eye: Secondary | ICD-10-CM

## 2018-09-07 DIAGNOSIS — R32 Unspecified urinary incontinence: Secondary | ICD-10-CM | POA: Diagnosis present

## 2018-09-07 DIAGNOSIS — S299XXA Unspecified injury of thorax, initial encounter: Secondary | ICD-10-CM | POA: Diagnosis not present

## 2018-09-07 DIAGNOSIS — S0990XA Unspecified injury of head, initial encounter: Secondary | ICD-10-CM | POA: Diagnosis not present

## 2018-09-07 DIAGNOSIS — R221 Localized swelling, mass and lump, neck: Secondary | ICD-10-CM | POA: Diagnosis not present

## 2018-09-07 DIAGNOSIS — Z91041 Radiographic dye allergy status: Secondary | ICD-10-CM

## 2018-09-07 DIAGNOSIS — N184 Chronic kidney disease, stage 4 (severe): Secondary | ICD-10-CM | POA: Diagnosis not present

## 2018-09-07 DIAGNOSIS — R531 Weakness: Secondary | ICD-10-CM | POA: Diagnosis not present

## 2018-09-07 DIAGNOSIS — N12 Tubulo-interstitial nephritis, not specified as acute or chronic: Secondary | ICD-10-CM | POA: Diagnosis not present

## 2018-09-07 DIAGNOSIS — R296 Repeated falls: Secondary | ICD-10-CM | POA: Diagnosis not present

## 2018-09-07 DIAGNOSIS — Z87891 Personal history of nicotine dependence: Secondary | ICD-10-CM

## 2018-09-07 DIAGNOSIS — Z8546 Personal history of malignant neoplasm of prostate: Secondary | ICD-10-CM

## 2018-09-07 DIAGNOSIS — E785 Hyperlipidemia, unspecified: Secondary | ICD-10-CM | POA: Diagnosis present

## 2018-09-07 DIAGNOSIS — B965 Pseudomonas (aeruginosa) (mallei) (pseudomallei) as the cause of diseases classified elsewhere: Secondary | ICD-10-CM | POA: Diagnosis present

## 2018-09-07 DIAGNOSIS — R58 Hemorrhage, not elsewhere classified: Secondary | ICD-10-CM | POA: Diagnosis not present

## 2018-09-07 DIAGNOSIS — I480 Paroxysmal atrial fibrillation: Secondary | ICD-10-CM | POA: Diagnosis present

## 2018-09-07 DIAGNOSIS — Z885 Allergy status to narcotic agent status: Secondary | ICD-10-CM

## 2018-09-07 DIAGNOSIS — R0689 Other abnormalities of breathing: Secondary | ICD-10-CM | POA: Diagnosis not present

## 2018-09-07 DIAGNOSIS — Z79899 Other long term (current) drug therapy: Secondary | ICD-10-CM

## 2018-09-07 DIAGNOSIS — Z8249 Family history of ischemic heart disease and other diseases of the circulatory system: Secondary | ICD-10-CM

## 2018-09-07 DIAGNOSIS — I10 Essential (primary) hypertension: Secondary | ICD-10-CM | POA: Diagnosis present

## 2018-09-07 DIAGNOSIS — Z9079 Acquired absence of other genital organ(s): Secondary | ICD-10-CM

## 2018-09-07 LAB — COMPREHENSIVE METABOLIC PANEL
ALT: 28 U/L (ref 0–44)
AST: 26 U/L (ref 15–41)
Albumin: 2.8 g/dL — ABNORMAL LOW (ref 3.5–5.0)
Alkaline Phosphatase: 69 U/L (ref 38–126)
Anion gap: 10 (ref 5–15)
BUN: 53 mg/dL — ABNORMAL HIGH (ref 8–23)
CO2: 18 mmol/L — ABNORMAL LOW (ref 22–32)
Calcium: 8.6 mg/dL — ABNORMAL LOW (ref 8.9–10.3)
Chloride: 112 mmol/L — ABNORMAL HIGH (ref 98–111)
Creatinine, Ser: 3.39 mg/dL — ABNORMAL HIGH (ref 0.61–1.24)
GFR calc Af Amer: 18 mL/min — ABNORMAL LOW (ref >60)
GFR calc non Af Amer: 16 mL/min — ABNORMAL LOW (ref >60)
Glucose, Bld: 110 mg/dL — ABNORMAL HIGH (ref 70–99)
Potassium: 4.4 mmol/L (ref 3.5–5.1)
Sodium: 140 mmol/L (ref 135–145)
Total Bilirubin: 0.5 mg/dL (ref 0.3–1.2)
Total Protein: 6.7 g/dL (ref 6.5–8.1)

## 2018-09-07 LAB — URINALYSIS, ROUTINE W REFLEX MICROSCOPIC
Bilirubin Urine: NEGATIVE
Glucose, UA: NEGATIVE mg/dL
Ketones, ur: NEGATIVE mg/dL
Nitrite: POSITIVE — AB
Protein, ur: 100 mg/dL — AB
Specific Gravity, Urine: 1.013 (ref 1.005–1.030)
WBC, UA: >50 WBC/hpf — ABNORMAL HIGH (ref 0–5)
pH: 5 (ref 5.0–8.0)

## 2018-09-07 LAB — CBC WITH DIFFERENTIAL/PLATELET
Basophils Absolute: 0 10*3/uL (ref 0.0–0.1)
Basophils Relative: 0 %
Eosinophils Absolute: 0 10*3/uL (ref 0.0–0.7)
Eosinophils Relative: 0 %
HCT: 24.4 % — ABNORMAL LOW (ref 39.0–52.0)
Hemoglobin: 7.9 g/dL — ABNORMAL LOW (ref 13.0–17.0)
Lymphocytes Relative: 7 %
Lymphs Abs: 0.8 10*3/uL (ref 0.7–4.0)
MCH: 28.6 pg (ref 26.0–34.0)
MCHC: 32.4 g/dL (ref 30.0–36.0)
MCV: 88.4 fL (ref 78.0–100.0)
Monocytes Absolute: 0.6 10*3/uL (ref 0.1–1.0)
Monocytes Relative: 6 %
Neutro Abs: 9.7 10*3/uL — ABNORMAL HIGH (ref 1.7–7.7)
Neutrophils Relative %: 87 %
Platelets: 208 10*3/uL (ref 150–400)
RBC: 2.76 MIL/uL — ABNORMAL LOW (ref 4.22–5.81)
RDW: 15.9 % — ABNORMAL HIGH (ref 11.5–15.5)
WBC: 11.1 10*3/uL — ABNORMAL HIGH (ref 4.0–10.5)

## 2018-09-07 LAB — CBG MONITORING, ED: Glucose-Capillary: 115 mg/dL — ABNORMAL HIGH (ref 70–99)

## 2018-09-07 LAB — CK: Total CK: 52 U/L (ref 49–397)

## 2018-09-07 LAB — I-STAT CG4 LACTIC ACID, ED: Lactic Acid, Venous: 0.84 mmol/L (ref 0.5–1.9)

## 2018-09-07 LAB — POC OCCULT BLOOD, ED: Fecal Occult Bld: POSITIVE — AB

## 2018-09-07 LAB — TROPONIN I: Troponin I: <0.03 ng/mL (ref <0.03)

## 2018-09-07 MED ORDER — AMLODIPINE BESYLATE 5 MG PO TABS
5.0000 mg | ORAL_TABLET | Freq: Every day | ORAL | Status: DC
  Administered 2018-09-07 – 2018-09-10 (×4): 5 mg via ORAL
  Filled 2018-09-07 (×4): qty 1

## 2018-09-07 MED ORDER — PIPERACILLIN-TAZOBACTAM 3.375 G IVPB 30 MIN
3.3750 g | Freq: Once | INTRAVENOUS | Status: AC
  Administered 2018-09-07: 3.375 g via INTRAVENOUS
  Filled 2018-09-07: qty 50

## 2018-09-07 MED ORDER — PRO-STAT SUGAR FREE PO LIQD
30.0000 mL | Freq: Two times a day (BID) | ORAL | Status: DC
  Administered 2018-09-07 – 2018-09-08 (×2): 30 mL via ORAL
  Filled 2018-09-07 (×9): qty 30

## 2018-09-07 MED ORDER — ROSUVASTATIN CALCIUM 10 MG PO TABS
10.0000 mg | ORAL_TABLET | Freq: Every day | ORAL | Status: DC
  Administered 2018-09-07 – 2018-09-09 (×3): 10 mg via ORAL
  Filled 2018-09-07 (×3): qty 1

## 2018-09-07 MED ORDER — SODIUM CHLORIDE 0.9 % IV SOLN
1.0000 g | INTRAVENOUS | Status: DC
  Administered 2018-09-07 – 2018-09-09 (×3): 1 g via INTRAVENOUS
  Filled 2018-09-07 (×5): qty 1

## 2018-09-07 MED ORDER — SERTRALINE HCL 50 MG PO TABS
100.0000 mg | ORAL_TABLET | Freq: Every day | ORAL | Status: DC
  Administered 2018-09-07 – 2018-09-10 (×4): 100 mg via ORAL
  Filled 2018-09-07 (×4): qty 2

## 2018-09-07 MED ORDER — HEPARIN SODIUM (PORCINE) 5000 UNIT/ML IJ SOLN
5000.0000 [IU] | Freq: Three times a day (TID) | INTRAMUSCULAR | Status: DC
  Administered 2018-09-07 – 2018-09-10 (×8): 5000 [IU] via SUBCUTANEOUS
  Filled 2018-09-07 (×8): qty 1

## 2018-09-07 MED ORDER — SODIUM CHLORIDE 0.9 % IV BOLUS
1000.0000 mL | Freq: Once | INTRAVENOUS | Status: AC
  Administered 2018-09-07: 1000 mL via INTRAVENOUS

## 2018-09-07 MED ORDER — ACETAMINOPHEN 325 MG PO TABS: 650.0000 mg | ORAL_TABLET | Freq: Four times a day (QID) | ORAL | Status: DC | PRN

## 2018-09-07 MED ORDER — ACETAMINOPHEN 650 MG RE SUPP: 650.0000 mg | Freq: Four times a day (QID) | RECTAL | Status: DC | PRN

## 2018-09-07 MED ORDER — SODIUM CHLORIDE 0.9 % IV SOLN
INTRAVENOUS | Status: AC
  Administered 2018-09-07: 20:00:00 via INTRAVENOUS

## 2018-09-07 NOTE — ED Notes (Signed)
Pt has skin tear to r arm and under left eye.  PT has steri strips in place over left eye from previous fall.

## 2018-09-07 NOTE — ED Notes (Signed)
Phlebotomy at bedside.

## 2018-09-07 NOTE — ED Triage Notes (Signed)
Family found pt sitting in the floor when they went to check on him this morning. Reports had been in the floor for approx 52min.  Reports felt dizzy and fell.  EMS says pt unable to tolerate standing for orthostatics.  HR 116,   bp 115/74 auto, 100/66manually.  EMS started IV in L ac.  Pt was recently in the hospital and then was at [facility] for rehab.  EMS unsure of reason for hospitalization.

## 2018-09-07 NOTE — ED Provider Notes (Addendum)
[facility] EMERGENCY DEPARTMENT Provider Note   CSN: [id number] Arrival date & time: 09/07/18  1021     History   Chief Complaint Chief Complaint  Patient presents with  . Fall    HPI [name] [name] is a 84 y.o. male.  HPI  84 y/o male - had mechanical fall this morning - reaching for his walker -s tates he was on the ground for 30 minutes - family came over and found him on the floor - EMS had to knock down the floor - hit head on the walker and hit the elbow.  He couldn't get up - occurred acutely - started 2 hours ago - has pain in the L elbow - no LOC, no seizure - no vomiting.  No other c/o.  He has had some multiple falls.  On further questioning the patient states that he thinks that he is getting lightheaded and passing out causing the falls.  He was recently admitted to the hospital at [facility] from August 23 to September 13 after he had acute kidney injury because of bladder outlet obstruction.  During that admission he had bilateral nephrostomy tubes that were placed.  According to medical record baseline creatinine seems to be around 3.  He is also had history of anemia and required multiple units of packed red cells to be transfused recently.  It was thought that he did have some parkinsonian type symptoms and was started on Sinemet  Recently treated with doxycycline for a rash in the groin  The patient states that he was unable to get up off of the ground because of generalized weakness after his fall today.  He does distinctly remember trying to reach for his walker when he fell forward and bumped the walker in the ground.  Past Medical History:  Diagnosis Date  . 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   . Cancer ([place])    prostate ca 1999/removal/ rad tx  . Carotid artery occlusion   . Hyperlipidemia   . Hypertension   . Incontinence   . Reflux   . Renal disorder   .  Status post implantation of artificial urinary sphincter     Patient Active Problem List   Diagnosis Date Noted  . Gross hematuria 07/25/2018  . Sepsis due to urinary tract infection ([place]) 07/19/2018  . Paroxysmal A-fib ([place]) 07/19/2018  . Chronic kidney disease, stage IV (severe) ([place]) 07/19/2018  . Hyperlipidemia 08/05/2014  . Knee effusion, right 04/21/2012  . Essential hypertension 07/25/2011  . Anemia 08/27/2010  . CAROTID ARTERY DISEASE 06/14/2010  . CLOSED FRACTURE OF ACROMIAL END OF CLAVICLE 04/25/2010    Past Surgical History:  Procedure Laterality Date  . cataracts    . HERNIA REPAIR    . radical prostectomy    . URINARY SPHINCTER IMPLANT          Home Medications    Prior to Admission medications   Medication Sig Start Date End Date Taking? Authorizing Provider  acetaminophen (TYLENOL) 500 MG tablet Take 1,000 mg by mouth every 8 (eight) hours as needed for mild pain or moderate pain. For headache pain    Yes [provider]  ALFALFA PO Take 250 mg by mouth daily.    Yes [provider]  amLODipine (NORVASC) 5 MG tablet Take 5 mg by mouth daily.   Yes [provider]  doxycycline (VIBRAMYCIN) 100 MG capsule  Take 100 mg by mouth 2 (two) times daily.    Yes [provider]  fish oil-omega-3 fatty acids 1000 MG capsule Take 1 g by mouth 2 (two) times daily.    Yes [provider]  Multiple Vitamin (MULTIVITAMIN WITH MINERALS) TABS tablet Take 1 tablet by mouth daily.   Yes [provider]  rosuvastatin (CRESTOR) 10 MG tablet Take 10 mg by mouth daily.    Yes [provider]  sertraline (ZOLOFT) 100 MG tablet Take 100 mg by mouth daily.   Yes [provider]  Influenza Vac Split Quad (FLUARIX QUADRIVALENT IM) Inject 0.5 mLs into the muscle once. 09/03/18   [provider]    Family History Family History  Problem Relation Age of Onset  . Coronary artery disease Unknown        family  history, male < 38  . Arthritis Unknown        family history  . Cancer Unknown   . Kidney disease Unknown     Social History Social History   Tobacco Use  . Smoking status: Former Smoker    Packs/day: 2.00    Types: Cigarettes    Start date: 08/05/1962    Last attempt to quit: 12/02/1976    Years since quitting: 41.7  . Smokeless tobacco: Never Used  Substance Use Topics  . Alcohol use: No  . Drug use: No     Allergies   Ivp dye [iodinated diagnostic agents]; Nalbuphine; and Codeine   Review of Systems Review of Systems  All other systems reviewed and are negative.    Physical Exam Updated Vital Signs BP (!) 134/54   Pulse 83   Temp 97.9 F (36.6 C) (Oral)   Resp (!) 21   Ht 1.727 m (5\' 8" )   Wt 74.8 kg   SpO2 (!) 86%   BMI 25.09 kg/m   Physical Exam  Constitutional: He appears well-developed and well-nourished. No distress.  HENT:  Head: Normocephalic.  Mouth/Throat: No oropharyngeal exudate.  Healing abrasion to the left frontotemporal region, mucous membranes dry  Eyes: Pupils are equal, round, and reactive to light. Conjunctivae and EOM are normal. Right eye exhibits no discharge. Left eye exhibits no discharge. No scleral icterus.  Neck: Normal range of motion. Neck supple. No JVD present.  Trachea seems to be deviated to the left  Cardiovascular: Normal rate, regular rhythm, normal heart sounds and intact distal pulses. Exam reveals no gallop and no friction rub.  No murmur heard. Pulmonary/Chest: Effort normal and breath sounds normal. No respiratory distress. He has no wheezes. He has no rales.  Abdominal: Soft. Bowel sounds are normal. He exhibits no distension and no mass. There is no tenderness.  Musculoskeletal: Normal range of motion. He exhibits no edema or tenderness.  Lymphadenopathy:    He has no cervical adenopathy.  Neurological: He is alert. Coordination normal.  The patient is alert, he is generally weak, he is able to assist in  sitting up in the bed, no facial droop  Skin: Skin is warm and dry. No rash noted. No erythema.  No rashes seen, he does have bilateral nephrostomy tubes in the flanks  Psychiatric: He has a normal mood and affect. His behavior is normal.  Nursing note and vitals reviewed.    ED Treatments / Results  Labs (all labs ordered are listed, but only abnormal results are displayed) Labs Reviewed  COMPREHENSIVE METABOLIC PANEL - Abnormal; Notable for the following components:  Result Value   Chloride 112 (*)    CO2 18 (*)    Glucose, Bld 110 (*)    BUN 53 (*)    Creatinine, Ser 3.39 (*)    Calcium 8.6 (*)    Albumin 2.8 (*)    GFR calc non Af Amer 16 (*)    GFR calc Af Amer 18 (*)    All other components within normal limits  CBC WITH DIFFERENTIAL/PLATELET - Abnormal; Notable for the following components:   WBC 11.1 (*)    RBC 2.76 (*)    Hemoglobin 7.9 (*)    HCT 24.4 (*)    RDW 15.9 (*)    Neutro Abs 9.7 (*)    All other components within normal limits  URINALYSIS, ROUTINE W REFLEX MICROSCOPIC - Abnormal; Notable for the following components:   APPearance CLOUDY (*)    Hgb urine dipstick MODERATE (*)    Protein, ur 100 (*)    Nitrite POSITIVE (*)    Leukocytes, UA LARGE (*)    WBC, UA >50 (*)    Bacteria, UA RARE (*)    All other components within normal limits  CBG MONITORING, ED - Abnormal; Notable for the following components:   Glucose-Capillary 115 (*)    All other components within normal limits  POC OCCULT BLOOD, ED - Abnormal; Notable for the following components:   Fecal Occult Bld POSITIVE (*)    All other components within normal limits  URINE CULTURE  CK  I-STAT CG4 LACTIC ACID, ED    EKG EKG Interpretation  Date/Time:  Monday September 07 2018 10:40:02 EDT Ventricular Rate:  90 PR Interval:    QRS Duration: 96 QT Interval:  380 QTC Calculation: 465 R Axis:   47 Text Interpretation:  Sinus rhythm Normal ECG Since last tracing rate faster Confirmed  by Noemi Chapel [phone]) on 09/07/2018 3:51:32 PM   Radiology Dg Elbow Complete Right (3+view)  Result Date: 09/07/2018 CLINICAL DATA:  FALL TODAY.  RIGHT ELBOW PAIN. EXAM: RIGHT ELBOW - COMPLETE 3+ VIEW COMPARISON:  None. FINDINGS: Soft tissue abrasion is noted posterior to the olecranon. There is no underlying fracture or foreign body. There is no joint effusion. The joint is located. IMPRESSION: 1. Superficial soft tissue abrasion without underlying fracture. Electronically Signed   By: San Morelle M.D.   On: 09/07/2018 12:46   Ct Soft Tissue Neck Wo Contrast  Result Date: 09/07/2018 CLINICAL DATA:  Neck mass.  Solitary, afebrile. EXAM: CT NECK WITHOUT CONTRAST TECHNIQUE: Multidetector CT imaging of the neck was performed following the standard protocol without intravenous contrast. COMPARISON:  None. FINDINGS: Pharynx and larynx: Normal. No mass or swelling. Salivary glands: Atrophic right parotid gland. Left parotid normal. Submandibular glands mildly atrophic bilaterally. Negative for mass or calculus. Thyroid: Negative Lymph nodes: Negative Vascular: Atherosclerotic disease. Limited evaluation of the vessels without intravenous contrast. Limited intracranial: Negative Visualized orbits: Not imaged Mastoids and visualized paranasal sinuses: Mild mucosal edema right maxillary sinus. Otherwise clear. Mastoid clear bilaterally. Skeleton: Cervical spondylosis C5-6 and C6-7. No acute skeletal abnormality. Upper chest: Lung apices clear bilaterally. Other: None IMPRESSION: Negative for mass or adenopathy in the neck. No acute abnormality identified Atherosclerotic disease. Atrophic right parotid gland and bilateral submandibular glands without acute abnormality. Electronically Signed   By: Franchot Gallo M.D.   On: 09/07/2018 13:25   Dg Chest Port 1 View  Result Date: 09/07/2018 CLINICAL DATA:  Weakness, fall EXAM: PORTABLE CHEST 1 VIEW COMPARISON:  06/11/2017, 04/29/2018 FINDINGS: There  is  right basilar scarring with tenting of the diaphragm. There is mild lingular scarring. There is no focal consolidation. There is no pleural effusion or pneumothorax. The heart and mediastinal contours are unremarkable. The osseous structures are unremarkable. IMPRESSION: No acute cardiopulmonary disease. Electronically Signed   By: Kathreen Devoid   On: 09/07/2018 12:50    Procedures Procedures (including critical care time)  Medications Ordered in ED Medications  piperacillin-tazobactam (ZOSYN) IVPB 3.375 g (has no administration in time range)  sodium chloride 0.9 % bolus 1,000 mL (0 mLs Intravenous Stopped 09/07/18 1423)     Initial Impression / Assessment and Plan / ED Course  I have reviewed the triage vital signs and the nursing notes.  Pertinent labs & imaging results that were available during my care of the patient were reviewed by me and considered in my medical decision making (see chart for details).  Clinical Course as of Sep 08 1543  Mon Sep 07, 2018  1304 Blood counts compared to prior level show that he is approximately 1 g lower with his hemoglobin when he was a week ago.   [BM]  1540 The patient has urinary tract infection evidence on urinalysis with numerous white blood cells, bacteria are present, nitrite positive.  Given the patient's weakness, his lightheadedness with standing, dehydration and orthostasis this is likely a part of the cause.  He is also anemic compared to baseline and compared to prior labs he is at least 1 g lower than he was 3 weeks ago.  His Hemoccult test on my exam is negative.  Will discuss with the hospitalist for admission.   [BM]    Clinical Course User Index [BM] Noemi Chapel, MD    The patient appears weak, he appears dehydrated however he is a mouth breather, will check labs given recent injury of acute kidney injury and dehydration, check for urinary tract infection from nephrostomy tubes, the patient is afebrile, he is not tachycardic, he  is not hypotensive, I suspect that dehydration is playing a role in his symptoms as he gets lightheaded when he stands.  Started Zosyn as recommended by antibiotic order sent for catheter associated urinary infection.  Creatinine seems to be slightly higher than baseline.  Paged hospitalist to discuss.  Cussed with Dr. Maudie Mercury who will admit  Final Clinical Impressions(s) / ED Diagnoses   Final diagnoses:  Pyelonephritis    ED Discharge Orders    None       Noemi Chapel, MD 09/07/18 1551    Noemi Chapel, MD 09/07/18 1600

## 2018-09-07 NOTE — ED Notes (Signed)
Unable to perform orthostatic vitals. Pt states he feels too weak to stand up.

## 2018-09-07 NOTE — Progress Notes (Signed)
Pharmacy Antibiotic Note  [name] [name] is a 84 y.o. male admitted on 09/07/2018 with UTI.  Pharmacy has been consulted for Cefepime dosing.  Plan: Cefepime 1gm IV every 24 hours. Monitor labs, micro and vitals.   Height: 5\' 8"  (172.7 cm) Weight: 165 lb (74.8 kg) IBW/kg (Calculated) : 68.4  Temp (24hrs), Avg:98.2 F (36.8 C), Min:97.9 F (36.6 C), Max:98.7 F (37.1 C)  Recent Labs  Lab 09/04/18 0700 09/07/18 1228 09/07/18 1255  WBC  --  11.1*  --   CREATININE 2.93* 3.39*  --   LATICACIDVEN  --   --  0.84    Estimated Creatinine Clearance: 16.5 mL/min (A) (by C-G formula based on SCr of 3.39 mg/dL (H)).    Allergies  Allergen Reactions  . Ivp Dye [Iodinated Diagnostic Agents]     Due to partial renal failure  . Nalbuphine Other (See Comments)    PATIENT STATES IT MADE HIM FEEL LIKE HE WAS GOING TO DIE. NO SPECIFICS  . Codeine Anxiety    Antimicrobials this admission: Cefepime 10/7  >>       >>   Dose adjustments this admission: n/a   Microbiology results:  BCx:  10/7 UCx:  pending  Sputum:    MRSA PCR:   Thank you for allowing pharmacy to be a part of this patient's care.  Pricilla Larsson 09/07/2018 10:15 PM

## 2018-09-07 NOTE — H&P (Addendum)
TRH H&P   Patient Demographics:    [name] [name], is a 84 y.o. male  MRN: [id number]   DOB - [birth year]  Admit Date - 09/07/2018  Outpatient Primary MD for the patient is [name], MD  Referring MD/NP/PA: Noemi Chapel  Outpatient Specialists:  Hinda Lenis  Patient coming from: home  Chief Complaint  Patient presents with  . Fall      HPI:    [name] [name]  is a 84 y.o. male, w hypertension, hyperlipidemia, ckd stage 4 (baseline creatinine 2.8), anemia, apparently c/o syncope, lasting a few seconds.  Pt notes slightly lightheaded w standing per ED.  Pt denies cp, palp, sob, n/v, diarrhea, brbpr.  Prior colonoscopy negative per pt.  Pt denies dysuria, hematuria, .  Pt notes he has chronic urinary incontinence.   In ED,  T 97.9, P 74,  R 18, Bp 136/57  Pox 99% on RA  Wbc 11.1, Hgb 7.9, Plt 208  Na 140, K 4.4,  Bun 53, Creatinine 3.39 Glucose 110 Lactic acid 0.84  Xray R elbow IMPRESSION: 1. Superficial soft tissue abrasion without underlying fracture.  CXR IMPRESSION: No acute cardiopulmonary disease.  CT neck IMPRESSION: Negative for mass or adenopathy in the neck. No acute abnormality identified  Atherosclerotic disease.  Atrophic right parotid gland and bilateral submandibular glands without acute abnormality.  EKG nsr at 90, nl axis. No st- t changes c/w ischemia  Pt will be admitted for syncope, mild ARF, and Anemia    Review of systems:    In addition to the HPI above, No Fever-chills, No Headache, No changes with Vision or hearing, No problems swallowing food or Liquids, No Chest pain, Cough or Shortness of Breath, No Abdominal pain, No Nausea or Vommitting, Bowel movements are regular, No Blood in stool or Urine, No dysuria, No new skin rashes or bruises, No new joints pains-aches,  No new weakness, tingling, numbness in any  extremity, No recent weight gain or loss, No polyuria, polydypsia or polyphagia, No significant Mental Stressors.  A full 10 point Review of Systems was done, except as stated above, all other Review of Systems were negative.   With Past History of the following :    Past Medical History:  Diagnosis Date  . 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   . Cancer ([place])    prostate ca 1999/removal/ rad tx  . Carotid artery occlusion   . Hyperlipidemia   . Hypertension   . Incontinence   . Reflux   . Renal disorder   . Status post implantation of artificial urinary sphincter       Past Surgical History:  Procedure Laterality Date  . cataracts    . HERNIA REPAIR    . NEPHROSTOMY    . radical prostectomy    . URINARY SPHINCTER  IMPLANT        Social History:     Social History   Tobacco Use  . Smoking status: Former Smoker    Packs/day: 2.00    Types: Cigarettes    Start date: 08/05/1962    Last attempt to quit: 12/02/1976    Years since quitting: 41.7  . Smokeless tobacco: Never Used  Substance Use Topics  . Alcohol use: No     Lives -  At home alone  Mobility -  Walks by self   Family History :     Family History  Problem Relation Age of Onset  . Coronary artery disease Unknown        family history, male < 6  . Arthritis Unknown        family history  . Cancer Unknown   . Kidney disease Unknown        Home Medications:   Prior to Admission medications   Medication Sig Start Date End Date Taking? Authorizing Provider  acetaminophen (TYLENOL) 500 MG tablet Take 1,000 mg by mouth every 8 (eight) hours as needed for mild pain or moderate pain. For headache pain    Yes [provider]  ALFALFA PO Take 250 mg by mouth daily.    Yes [provider]  amLODipine (NORVASC) 5 MG tablet Take 5 mg by mouth daily.   Yes [provider]  doxycycline (VIBRAMYCIN) 100  MG capsule Take 100 mg by mouth 2 (two) times daily.    Yes [provider]  fish oil-omega-3 fatty acids 1000 MG capsule Take 1 g by mouth 2 (two) times daily.    Yes [provider]  Multiple Vitamin (MULTIVITAMIN WITH MINERALS) TABS tablet Take 1 tablet by mouth daily.   Yes [provider]  rosuvastatin (CRESTOR) 10 MG tablet Take 10 mg by mouth daily.    Yes [provider]  sertraline (ZOLOFT) 100 MG tablet Take 100 mg by mouth daily.   Yes [provider]  Influenza Vac Split Quad (FLUARIX QUADRIVALENT IM) Inject 0.5 mLs into the muscle once. 09/03/18   [provider]     Allergies:     Allergies  Allergen Reactions  . Ivp Dye [Iodinated Diagnostic Agents]     Due to partial renal failure  . Nalbuphine Other (See Comments)    PATIENT STATES IT MADE HIM FEEL LIKE HE WAS GOING TO DIE. NO SPECIFICS  . Codeine Anxiety     Physical Exam:   Vitals  Blood pressure (!) 136/57, pulse 74, temperature 97.9 F (36.6 C), temperature source Oral, resp. rate 18, height 5\' 8"  (1.727 m), weight 74.8 kg, SpO2 99 %.   1. General  lying in bed in NAD,   2. Normal affect and insight, Not Suicidal or Homicidal, Awake Alert, Oriented X 3.  3. No F.N deficits, ALL C.Nerves Intact, Strength 5/5 all 4 extremities, Sensation intact all 4 extremities, Plantars down going.  4. Ears and Eyes appear Normal, Conjunctivae clear, PERRLA. Dry lips and dry Oral Mucosa.  5. Supple Neck, No JVD, No cervical lymphadenopathy appriciated, No Carotid Bruits.  6. Symmetrical Chest wall movement, Good air movement bilaterally, CTAB.  7. RRR, No Gallops, Rubs or Murmurs, No Parasternal Heave.  8. Positive Bowel Sounds, Abdomen Soft, No tenderness, No organomegaly appriciated,No rebound -guarding or rigidity.  9.  No Cyanosis, Normal Skin Turgor, No Skin Rash or Bruise.  10. Good muscle tone,  joints appear normal , no effusions, Normal ROM.  11. No  Palpable Lymph Nodes in Neck or Axillae  Slight blood on skin under the left eye, scrape over the right elbow   Data Review:    CBC Recent Labs  Lab 09/04/18 0700 09/07/18 1228  WBC  --  11.1*  HGB 8.3* 7.9*  HCT 26.0* 24.4*  PLT  --  208  MCV  --  88.4  MCH  --  28.6  MCHC  --  32.4  RDW  --  15.9*  LYMPHSABS  --  0.8  MONOABS  --  0.6  EOSABS  --  0.0  BASOSABS  --  0.0   ------------------------------------------------------------------------------------------------------------------  Chemistries  Recent Labs  Lab 09/04/18 0700 09/07/18 1228  NA 140 140  K 4.2 4.4  CL 111 112*  CO2 21* 18*  GLUCOSE 96 110*  BUN 43* 53*  CREATININE 2.93* 3.39*  CALCIUM 8.8*  8.8 8.6*  AST  --  26  ALT  --  28  ALKPHOS  --  69  BILITOT  --  0.5   ------------------------------------------------------------------------------------------------------------------ estimated creatinine clearance is 16.5 mL/min (A) (by C-G formula based on SCr of 3.39 mg/dL (H)). ------------------------------------------------------------------------------------------------------------------ No results for input(s): TSH, T4TOTAL, T3FREE, THYROIDAB in the last 72 hours.  Invalid input(s): FREET3  Coagulation profile No results for input(s): INR, PROTIME in the last 168 hours. ------------------------------------------------------------------------------------------------------------------- No results for input(s): DDIMER in the last 72 hours. -------------------------------------------------------------------------------------------------------------------  Cardiac Enzymes No results for input(s): CKMB, TROPONINI, MYOGLOBIN in the last 168 hours.  Invalid input(s): CK ------------------------------------------------------------------------------------------------------------------ No results found for:  BNP   ---------------------------------------------------------------------------------------------------------------  Urinalysis    Component Value Date/Time   COLORURINE YELLOW 09/07/2018 1404   APPEARANCEUR CLOUDY (A) 09/07/2018 1404   LABSPEC 1.013 09/07/2018 1404   PHURINE 5.0 09/07/2018 1404   GLUCOSEU NEGATIVE 09/07/2018 1404   HGBUR MODERATE (A) 09/07/2018 1404   BILIRUBINUR NEGATIVE 09/07/2018 1404   KETONESUR NEGATIVE 09/07/2018 1404   PROTEINUR 100 (A) 09/07/2018 1404   UROBILINOGEN 0.2 03/14/2010 0809   NITRITE POSITIVE (A) 09/07/2018 1404   LEUKOCYTESUR LARGE (A) 09/07/2018 1404    ----------------------------------------------------------------------------------------------------------------   Imaging Results:    Dg Elbow Complete Right (3+view)  Result Date: 09/07/2018 CLINICAL DATA:  FALL TODAY.  RIGHT ELBOW PAIN. EXAM: RIGHT ELBOW - COMPLETE 3+ VIEW COMPARISON:  None. FINDINGS: Soft tissue abrasion is noted posterior to the olecranon. There is no underlying fracture or foreign body. There is no joint effusion. The joint is located. IMPRESSION: 1. Superficial soft tissue abrasion without underlying fracture. Electronically Signed   By: San Morelle M.D.   On: 09/07/2018 12:46   Ct Soft Tissue Neck Wo Contrast  Result Date: 09/07/2018 CLINICAL DATA:  Neck mass.  Solitary, afebrile. EXAM: CT NECK WITHOUT CONTRAST TECHNIQUE: Multidetector CT imaging of the neck was performed following the standard protocol without intravenous contrast. COMPARISON:  None. FINDINGS: Pharynx and larynx: Normal. No mass or swelling. Salivary glands: Atrophic right parotid gland. Left parotid normal. Submandibular glands mildly atrophic bilaterally. Negative for mass or calculus. Thyroid: Negative Lymph nodes: Negative Vascular: Atherosclerotic disease. Limited evaluation of the vessels without intravenous contrast. Limited intracranial: Negative Visualized orbits: Not imaged Mastoids  and visualized paranasal sinuses: Mild mucosal edema right maxillary sinus. Otherwise clear. Mastoid clear bilaterally. Skeleton: Cervical spondylosis C5-6 and C6-7. No acute skeletal abnormality. Upper chest: Lung apices clear bilaterally. Other: None IMPRESSION: Negative for mass or adenopathy in the neck. No acute abnormality identified Atherosclerotic disease. Atrophic right parotid gland and bilateral submandibular glands  without acute abnormality. Electronically Signed   By: Franchot Gallo M.D.   On: 09/07/2018 13:25   Dg Chest Port 1 View  Result Date: 09/07/2018 CLINICAL DATA:  Weakness, fall EXAM: PORTABLE CHEST 1 VIEW COMPARISON:  06/11/2017, 04/29/2018 FINDINGS: There is right basilar scarring with tenting of the diaphragm. There is mild lingular scarring. There is no focal consolidation. There is no pleural effusion or pneumothorax. The heart and mediastinal contours are unremarkable. The osseous structures are unremarkable. IMPRESSION: No acute cardiopulmonary disease. Electronically Signed   By: Kathreen Devoid   On: 09/07/2018 12:50      Assessment & Plan:    Principal Problem:   Syncope Active Problems:   Essential hypertension   Paroxysmal A-fib (HCC)   Anemia   Acute lower UTI   ARF (acute renal failure) (HCC)    Syncope Tele Trop I q6h x3 CT brain Carotid ultrasound Cardiac echo  ARF, mild Check renal ultrasound Hydrate with ns iv  UTI Urine culture pending tx with cefepime iv pharmacy to dose  Anemia Check iron studies, b12, folate,  Check cbc in am  Hypertension Cont amlodipine 5mg  po qday  R ICA stenosis Cont Crestor 10mg  po qhs Unclear why patient is not on aspirin  Anxiety Cont Zoloft 100mg  po qday  ? Pafib in the past? Unclear if has had in the past, please investigate further in AM Esp in regards to anticoagulation  CKD stage 4 Will need to follow up with [facility] as outpatient  DVT Prophylaxis Heparin -  SCDs  AM Labs Ordered, also  please review Full Orders  Family Communication: Admission, patients condition and plan of care including tests being ordered have been discussed with the patient  who indicate understanding and agree with the plan and Code Status.  Code Status  FULL CODE  Likely DC to  home  Condition GUARDED    Consults called:   none  Admission status: observation,  Pt will require admission for syncope and mild ARF, and anemia,  W/up pending and depending upon results of studies might require inpatient status  Time spent in minutes : 70   Jani Gravel M.D on 09/07/2018 at 4:23 PM  Between 7am to 7pm - Pager - [phone]  . After 7pm go to [email or web address] - password [facility]  [facility] - Office  [phone]

## 2018-09-08 ENCOUNTER — Inpatient Hospital Stay (HOSPITAL_COMMUNITY): Payer: MEDICARE

## 2018-09-08 ENCOUNTER — Observation Stay (HOSPITAL_COMMUNITY): Payer: MEDICARE

## 2018-09-08 DIAGNOSIS — I10 Essential (primary) hypertension: Secondary | ICD-10-CM | POA: Diagnosis not present

## 2018-09-08 DIAGNOSIS — K110 Atrophy of salivary gland: Secondary | ICD-10-CM | POA: Diagnosis present

## 2018-09-08 DIAGNOSIS — S01412D Laceration without foreign body of left cheek and temporomandibular area, subsequent encounter: Secondary | ICD-10-CM | POA: Diagnosis not present

## 2018-09-08 DIAGNOSIS — Z9079 Acquired absence of other genital organ(s): Secondary | ICD-10-CM | POA: Diagnosis not present

## 2018-09-08 DIAGNOSIS — I480 Paroxysmal atrial fibrillation: Secondary | ICD-10-CM | POA: Diagnosis present

## 2018-09-08 DIAGNOSIS — R32 Unspecified urinary incontinence: Secondary | ICD-10-CM | POA: Diagnosis present

## 2018-09-08 DIAGNOSIS — R296 Repeated falls: Secondary | ICD-10-CM | POA: Diagnosis present

## 2018-09-08 DIAGNOSIS — D649 Anemia, unspecified: Secondary | ICD-10-CM | POA: Diagnosis present

## 2018-09-08 DIAGNOSIS — Z79899 Other long term (current) drug therapy: Secondary | ICD-10-CM | POA: Diagnosis not present

## 2018-09-08 DIAGNOSIS — N12 Tubulo-interstitial nephritis, not specified as acute or chronic: Secondary | ICD-10-CM | POA: Diagnosis not present

## 2018-09-08 DIAGNOSIS — Z8546 Personal history of malignant neoplasm of prostate: Secondary | ICD-10-CM | POA: Diagnosis not present

## 2018-09-08 DIAGNOSIS — Z885 Allergy status to narcotic agent status: Secondary | ICD-10-CM | POA: Diagnosis not present

## 2018-09-08 DIAGNOSIS — N289 Disorder of kidney and ureter, unspecified: Secondary | ICD-10-CM | POA: Diagnosis not present

## 2018-09-08 DIAGNOSIS — R262 Difficulty in walking, not elsewhere classified: Secondary | ICD-10-CM | POA: Diagnosis not present

## 2018-09-08 DIAGNOSIS — D6489 Other specified anemias: Secondary | ICD-10-CM | POA: Diagnosis not present

## 2018-09-08 DIAGNOSIS — K219 Gastro-esophageal reflux disease without esophagitis: Secondary | ICD-10-CM | POA: Diagnosis not present

## 2018-09-08 DIAGNOSIS — I361 Nonrheumatic tricuspid (valve) insufficiency: Secondary | ICD-10-CM

## 2018-09-08 DIAGNOSIS — Z9181 History of falling: Secondary | ICD-10-CM | POA: Diagnosis not present

## 2018-09-08 DIAGNOSIS — S50311D Abrasion of right elbow, subsequent encounter: Secondary | ICD-10-CM | POA: Diagnosis not present

## 2018-09-08 DIAGNOSIS — R55 Syncope and collapse: Secondary | ICD-10-CM | POA: Diagnosis not present

## 2018-09-08 DIAGNOSIS — B965 Pseudomonas (aeruginosa) (mallei) (pseudomallei) as the cause of diseases classified elsewhere: Secondary | ICD-10-CM | POA: Diagnosis present

## 2018-09-08 DIAGNOSIS — R279 Unspecified lack of coordination: Secondary | ICD-10-CM | POA: Diagnosis not present

## 2018-09-08 DIAGNOSIS — N3021 Other chronic cystitis with hematuria: Secondary | ICD-10-CM | POA: Diagnosis not present

## 2018-09-08 DIAGNOSIS — I6523 Occlusion and stenosis of bilateral carotid arteries: Secondary | ICD-10-CM | POA: Diagnosis not present

## 2018-09-08 DIAGNOSIS — Z9849 Cataract extraction status, unspecified eye: Secondary | ICD-10-CM | POA: Diagnosis not present

## 2018-09-08 DIAGNOSIS — Z841 Family history of disorders of kidney and ureter: Secondary | ICD-10-CM | POA: Diagnosis not present

## 2018-09-08 DIAGNOSIS — Z8249 Family history of ischemic heart disease and other diseases of the circulatory system: Secondary | ICD-10-CM | POA: Diagnosis not present

## 2018-09-08 DIAGNOSIS — N390 Urinary tract infection, site not specified: Secondary | ICD-10-CM | POA: Diagnosis not present

## 2018-09-08 DIAGNOSIS — E860 Dehydration: Secondary | ICD-10-CM | POA: Diagnosis present

## 2018-09-08 DIAGNOSIS — E785 Hyperlipidemia, unspecified: Secondary | ICD-10-CM | POA: Diagnosis present

## 2018-09-08 DIAGNOSIS — Z87891 Personal history of nicotine dependence: Secondary | ICD-10-CM | POA: Diagnosis not present

## 2018-09-08 DIAGNOSIS — N184 Chronic kidney disease, stage 4 (severe): Secondary | ICD-10-CM | POA: Diagnosis not present

## 2018-09-08 DIAGNOSIS — Z91041 Radiographic dye allergy status: Secondary | ICD-10-CM | POA: Diagnosis not present

## 2018-09-08 DIAGNOSIS — I129 Hypertensive chronic kidney disease with stage 1 through stage 4 chronic kidney disease, or unspecified chronic kidney disease: Secondary | ICD-10-CM | POA: Diagnosis present

## 2018-09-08 DIAGNOSIS — F329 Major depressive disorder, single episode, unspecified: Secondary | ICD-10-CM | POA: Diagnosis present

## 2018-09-08 DIAGNOSIS — Z978 Presence of other specified devices: Secondary | ICD-10-CM | POA: Diagnosis not present

## 2018-09-08 DIAGNOSIS — N179 Acute kidney failure, unspecified: Secondary | ICD-10-CM | POA: Diagnosis present

## 2018-09-08 DIAGNOSIS — M6281 Muscle weakness (generalized): Secondary | ICD-10-CM | POA: Diagnosis not present

## 2018-09-08 LAB — COMPREHENSIVE METABOLIC PANEL
ALT: 23 U/L (ref 0–44)
AST: 27 U/L (ref 15–41)
Albumin: 2.7 g/dL — ABNORMAL LOW (ref 3.5–5.0)
Alkaline Phosphatase: 76 U/L (ref 38–126)
Anion gap: 8 (ref 5–15)
BUN: 48 mg/dL — ABNORMAL HIGH (ref 8–23)
CO2: 19 mmol/L — ABNORMAL LOW (ref 22–32)
Calcium: 8.4 mg/dL — ABNORMAL LOW (ref 8.9–10.3)
Chloride: 113 mmol/L — ABNORMAL HIGH (ref 98–111)
Creatinine, Ser: 2.9 mg/dL — ABNORMAL HIGH (ref 0.61–1.24)
GFR calc Af Amer: 22 mL/min — ABNORMAL LOW (ref >60)
GFR calc non Af Amer: 19 mL/min — ABNORMAL LOW (ref >60)
Glucose, Bld: 92 mg/dL (ref 70–99)
Potassium: 4.5 mmol/L (ref 3.5–5.1)
Sodium: 140 mmol/L (ref 135–145)
Total Bilirubin: 0.3 mg/dL (ref 0.3–1.2)
Total Protein: 6.7 g/dL (ref 6.5–8.1)

## 2018-09-08 LAB — CBC
HCT: 26 % — ABNORMAL LOW (ref 39.0–52.0)
Hemoglobin: 8.2 g/dL — ABNORMAL LOW (ref 13.0–17.0)
MCH: 28.5 pg (ref 26.0–34.0)
MCHC: 31.5 g/dL (ref 30.0–36.0)
MCV: 90.3 fL (ref 80.0–100.0)
Platelets: 211 10*3/uL (ref 150–400)
RBC: 2.88 MIL/uL — ABNORMAL LOW (ref 4.22–5.81)
RDW: 16.1 % — ABNORMAL HIGH (ref 11.5–15.5)
WBC: 9.2 10*3/uL (ref 4.0–10.5)

## 2018-09-08 LAB — ECHOCARDIOGRAM COMPLETE
Height: 68 in
Weight: 2627.88 oz

## 2018-09-08 LAB — TROPONIN I
Troponin I: <0.03 ng/mL (ref <0.03)
Troponin I: <0.03 ng/mL (ref <0.03)

## 2018-09-08 MED ORDER — SODIUM CHLORIDE 0.9 % IV SOLN
INTRAVENOUS | Status: AC
  Administered 2018-09-08: 13:00:00 via INTRAVENOUS

## 2018-09-08 NOTE — Progress Notes (Signed)
*  PRELIMINARY RESULTS* Echocardiogram 2D Echocardiogram has been performed.  [name] [name] 09/08/2018, 3:57 PM

## 2018-09-08 NOTE — Evaluation (Signed)
Physical Therapy Evaluation Patient Details Name: [name] [name] MRN: [id number] DOB: 07/14/1934 Today's Date: 09/08/2018   History of Present Illness  84yo male c/o syncope, lightheadedness. PMH prostate CA, HTN, nephrostomy   Clinical Impression  Patient received in bed, very flat affect but polite and willing to participate in PT session today. Performed orthostatics check as per MD order, however orthostatics negative, see vitals flowsheet for specific details.  He is able to complete functional bed mobility with min guard and extended time, increased dizziness. Performed vestibular screen (see below in note for specific details), initially patient with difficulty with initial VOR exercises but feel this may have been due to inattention as he was then able to perform first and all other VOR activities/testing successfully and with negative results; appears negative for BPPV per screen. He initially requires ModA with RW for functional transfers, however this faded to MinA with RW on subsequent attempts. Able to ambulate approximately 4ft to recliner with RW and min guard, gait distance limited by fatigue. He was left up in the chair with all needs met, chair alarm activated. He will continue to benefit from skilled PT services in the acute setting as well as potential return to ST-SNF moving forward; note, however, he may be able to progress to HHPT level during this hospital stay- will follow and update if/as appropriate.     Follow Up Recommendations SNF;Other (comment)(may be able to progress to HHPT, will update if/as appropriate )    Equipment Recommendations  Rolling walker with 5" wheels;3in1 (PT)    Recommendations for Other Services       Precautions / Restrictions Precautions Precautions: Fall;Other (comment) Precaution Comments: B nephrostomy bags/tubes with leg straps  Restrictions Weight Bearing Restrictions: No      Mobility  Bed Mobility Overal bed mobility: Needs  Assistance Bed Mobility: Sidelying to Sit;Supine to Sit;Sit to Supine;Sit to Sidelying   Sidelying to sit: Min assist Supine to sit: Min guard   Sit to sidelying: Min assist General bed mobility comments: min guard for supine to sit, required MinA for sitting<-> sidelying activity during vestibular testing   Transfers Overall transfer level: Needs assistance Equipment used: Rolling walker (2 wheeled) Transfers: Sit to/from Stand Sit to Stand: Min assist;Mod assist         General transfer comment: Initially ModA with VC for technique/sequencing, faded to MinA on subsequent attempts   Ambulation/Gait Ambulation/Gait assistance: Min guard Gait Distance (Feet): 4 Feet Assistive device: Rolling walker (2 wheeled) Gait Pattern/deviations: Step-through pattern;Decreased step length - right;Decreased step length - left;Decreased stride length Gait velocity: decreased    General Gait Details: gait distance limited by fatigue   Stairs            Wheelchair Mobility    Modified Rankin (Stroke Patients Only)       Balance Overall balance assessment: Needs assistance;History of Falls Sitting-balance support: Bilateral upper extremity supported;Feet supported Sitting balance-Leahy Scale: Good     Standing balance support: Bilateral upper extremity supported;During functional activity Standing balance-Leahy Scale: Fair Standing balance comment: heavy reliance on B UE support                    09/08/18 0001  Symptom Behavior  Type of Dizziness Lightheadedness  Frequency of Dizziness with motion in general   Duration of Dizziness minutes   Aggravating Factors Activity in general  Relieving Factors Rest  Occulomotor Exam  Occulomotor Alignment Normal  Spontaneous Absent  Gaze-induced Absent  Head shaking   Horizontal Absent  Head Shaking Vertical Absent  Smooth Pursuits Intact  Saccades Intact  Vestibulo-Occular Reflex  VOR 1 Head Only (x 1 viewing) initially  dysmetric but then WNL/unable to reproduce dysmetria or symptoms (likely due to initial inattention)  VOR Cancellation Normal  Comment feel initial "positive" was due to inattention, uanble to reproduce and all other VOR tests WNL   Positional Testing  Sidelying Test Sidelying Right;Sidelying Left  Sidelying Right  Sidelying Right Duration 1 minute   Sidelying Right Symptoms No nystagmus  Sidelying Left  Sidelying Left Duration 1 minute   Sidelying Left Symptoms No nystagmus  Cognition  Cognition Orientation Level Appropriate for developmental age;Oriented x 4  Orthostatics  Orthostatics Comment see vitals flowsheet, overall orthostatics negative                Pertinent Vitals/Pain Pain Assessment: No/denies pain    Home Living Family/patient expects to be discharged to:: Private residence Living Arrangements: Alone Available Help at Discharge: Personal care attendant;Other (Comment)(pateint reports they are there every day but unsure how many hours per day) Type of Home: House Home Access: Stairs to enter Entrance Stairs-Rails: None Entrance Stairs-Number of Steps: 2 Home Layout: Two level;Able to live on main level with bedroom/bathroom Home Equipment: Gilford Rile - 2 wheels      Prior Function Level of Independence: Independent with assistive device(s)         Comments: recent fall/syncopal episode      Hand Dominance        Extremity/Trunk Assessment   Upper Extremity Assessment Upper Extremity Assessment: Generalized weakness    Lower Extremity Assessment Lower Extremity Assessment: Generalized weakness    Cervical / Trunk Assessment Cervical / Trunk Assessment: Normal  Communication   Communication: No difficulties  Cognition Arousal/Alertness: Awake/alert Behavior During Therapy: Flat affect Overall Cognitive Status: Within Functional Limits for tasks assessed                                        General Comments General  comments (skin integrity, edema, etc.): see orthostatic section in vitals and vestibular flowsheets for outcomes of these measures (both negative)     Exercises     Assessment/Plan    PT Assessment Patient needs continued PT services  PT Problem List Decreased strength;Decreased mobility;Decreased safety awareness;Decreased coordination;Decreased activity tolerance;Decreased balance       PT Treatment Interventions DME instruction;Therapeutic activities;Gait training;Therapeutic exercise;Patient/family education;Stair training;Balance training;Functional mobility training;Neuromuscular re-education    PT Goals (Current goals can be found in the Care Plan section)  Acute Rehab PT Goals Patient Stated Goal: to feel better  PT Goal Formulation: With patient Time For Goal Achievement: 09/22/18 Potential to Achieve Goals: Fair    Frequency Min 3X/week   Barriers to discharge        Co-evaluation               AM-PAC PT "6 Clicks" Daily Activity  Outcome Measure Difficulty turning over in bed (including adjusting bedclothes, sheets and blankets)?: A Little Difficulty moving from lying on back to sitting on the side of the bed? : A Little Difficulty sitting down on and standing up from a chair with arms (e.g., wheelchair, bedside commode, etc,.)?: A Little Help needed moving to and from a bed to chair (including a wheelchair)?: A Little Help needed walking in hospital room?: A Little Help needed climbing 3-5 steps with a railing? :  A Lot 6 Click Score: 17    End of Session   Activity Tolerance: Patient tolerated treatment well;Patient limited by fatigue Patient left: in chair;with chair alarm set;with call bell/phone within reach   PT Visit Diagnosis: Unsteadiness on feet (R26.81);Muscle weakness (generalized) (M62.81);History of falling (Z91.81);Dizziness and giddiness (R42)    Time: 0947-1032 PT Time Calculation (min) (ACUTE ONLY): 45 min   Charges:   PT  Evaluation $PT Eval Moderate Complexity: 1 Mod PT Treatments $Therapeutic Activity: 23-37 mins          PT, DPT, CBIS  Supplemental Physical Therapist [place]    Pager [phone] Acute Rehab Office [phone]    

## 2018-09-08 NOTE — Progress Notes (Addendum)
Patient noted to have small amount of blood in urine  - small amount in drainage bag appearing pink - was pale yellow earlier this AM.  Patient states that his urine "gets bloody off and on" will continue to monitor and make MD aware

## 2018-09-08 NOTE — Progress Notes (Signed)
Infection Prevention  Call to [facility]  Unit:300 Regarding: isolation precautions IP Recommendation:Patient had positive C diff on 9/17 and needs Enteric precautions for 30 days/NO need to retest. Also had MRSA + culture of scrotum on 9/29 which warrants contact precautions. Continue Contact/enteric precautions thru 09/18/2018 then if no diarrhea can switch to Contact.

## 2018-09-08 NOTE — Progress Notes (Signed)
Subjective: He was admitted yesterday with near syncope and acute on chronic renal failure.  He says he feels better.  He has no new complaints.  He is been in and out of the hospital on multiple occasions in the last several weeks with problems with his bladder.  Objective: Vital signs in last 24 hours: Temp:  [97.9 F (36.6 C)-98.7 F (37.1 C)] 97.9 F (36.6 C) (10/08 0524) Pulse Rate:  [73-88] 73 (10/08 0524) Resp:  [14-21] 15 (10/08 0524) BP: (124-145)/(51-71) 136/51 (10/08 0524) SpO2:  [86 %-100 %] 98 % (10/08 0756) Weight:  [74.5 kg-74.8 kg] 74.5 kg (10/08 0500) Weight change:  Last BM Date: 09/07/18  Intake/Output from previous day: 10/07 0701 - 10/08 0700 In: 1773.2 [P.O.:240; I.V.:389.6; IV Piggyback:1143.6] Out: 1200 [Urine:1200]  PHYSICAL EXAM General appearance: alert, cooperative and no distress Resp: clear to auscultation bilaterally Cardio: regular rate and rhythm, S1, S2 normal, no murmur, click, rub or gallop GI: soft, non-tender; bowel sounds normal; no masses,  no organomegaly Extremities: extremities normal, atraumatic, no cyanosis or edema He is neurologically intact.  He appears depressed as always.  He has some bruising around his left orbit Lab Results:  Results for orders placed or performed during the hospital encounter of 09/07/18 (from the past 48 hour(s))  CBG monitoring, ED     Status: Abnormal   Collection Time: 09/07/18 10:34 AM  Result Value Ref Range   Glucose-Capillary 115 (H) 70 - 99 mg/dL  CK     Status: None   Collection Time: 09/07/18 12:28 PM  Result Value Ref Range   Total CK 52 49 - 397 U/L    Comment: Performed at [facility], [address] [place], [place] [address]  Comprehensive metabolic panel     Status: Abnormal   Collection Time: 09/07/18 12:28 PM  Result Value Ref Range   Sodium 140 135 - 145 mmol/L   Potassium 4.4 3.5 - 5.1 mmol/L   Chloride 112 (H) 98 - 111 mmol/L   CO2 18 (L) 22 - 32 mmol/L   Glucose, Bld 110 (H)  70 - 99 mg/dL   BUN 53 (H) 8 - 23 mg/dL   Creatinine, Ser 3.39 (H) 0.61 - 1.24 mg/dL   Calcium 8.6 (L) 8.9 - 10.3 mg/dL   Total Protein 6.7 6.5 - 8.1 g/dL   Albumin 2.8 (L) 3.5 - 5.0 g/dL   AST 26 15 - 41 U/L   ALT 28 0 - 44 U/L   Alkaline Phosphatase 69 38 - 126 U/L   Total Bilirubin 0.5 0.3 - 1.2 mg/dL   GFR calc non Af Amer 16 (L) >60 mL/min   GFR calc Af Amer 18 (L) >60 mL/min    Comment: (NOTE) The eGFR has been calculated using the CKD EPI equation. This calculation has not been validated in all clinical situations. eGFR's persistently <60 mL/min signify possible Chronic Kidney Disease.    Anion gap 10 5 - 15    Comment: Performed at [facility], [address], [place], [place] [address]  CBC with Differential/Platelet     Status: Abnormal   Collection Time: 09/07/18 12:28 PM  Result Value Ref Range   WBC 11.1 (H) 4.0 - 10.5 K/uL   RBC 2.76 (L) 4.22 - 5.81 MIL/uL   Hemoglobin 7.9 (L) 13.0 - 17.0 g/dL   HCT 24.4 (L) 39.0 - 52.0 %   MCV 88.4 78.0 - 100.0 fL   MCH 28.6 26.0 - 34.0 pg   MCHC 32.4  30.0 - 36.0 g/dL   RDW 15.9 (H) 11.5 - 15.5 %   Platelets 208 150 - 400 K/uL   Neutrophils Relative % 87 %   Neutro Abs 9.7 (H) 1.7 - 7.7 K/uL   Lymphocytes Relative 7 %   Lymphs Abs 0.8 0.7 - 4.0 K/uL   Monocytes Relative 6 %   Monocytes Absolute 0.6 0.1 - 1.0 K/uL   Eosinophils Relative 0 %   Eosinophils Absolute 0.0 0.0 - 0.7 K/uL   Basophils Relative 0 %   Basophils Absolute 0.0 0.0 - 0.1 K/uL    Comment: Performed at [facility], [address] [place], [place] [address]  I-Stat CG4 Lactic Acid, ED     Status: None   Collection Time: 09/07/18 12:55 PM  Result Value Ref Range   Lactic Acid, Venous 0.84 0.5 - 1.9 mmol/L  Urinalysis, Routine w reflex microscopic     Status: Abnormal   Collection Time: 09/07/18  2:04 PM  Result Value Ref Range   Color, Urine YELLOW YELLOW   APPearance CLOUDY (A) CLEAR   Specific Gravity, Urine 1.013 1.005 - 1.030   pH 5.0 5.0 - 8.0    Glucose, UA NEGATIVE NEGATIVE mg/dL   Hgb urine dipstick MODERATE (A) NEGATIVE   Bilirubin Urine NEGATIVE NEGATIVE   Ketones, ur NEGATIVE NEGATIVE mg/dL   Protein, ur 100 (A) NEGATIVE mg/dL   Nitrite POSITIVE (A) NEGATIVE   Leukocytes, UA LARGE (A) NEGATIVE   WBC, UA >50 (H) 0 - 5 WBC/hpf   Bacteria, UA RARE (A) NONE SEEN   Squamous Epithelial / LPF 0-5 0 - 5   WBC Clumps PRESENT    Mucus PRESENT    Budding Yeast PRESENT    Ca Oxalate Crys, UA PRESENT     Comment: Performed at [facility], [address], [place], [place] [address]  POC occult blood, ED Provider will collect     Status: Abnormal   Collection Time: 09/07/18  2:39 PM  Result Value Ref Range   Fecal Occult Bld POSITIVE (A) NEGATIVE  Troponin I     Status: None   Collection Time: 09/07/18  7:16 PM  Result Value Ref Range   Troponin I <0.03 <0.03 ng/mL    Comment: Performed at [facility], [address] [place], [place] [address]  Troponin I     Status: None   Collection Time: 09/08/18  1:23 AM  Result Value Ref Range   Troponin I <0.03 <0.03 ng/mL    Comment: Performed at [facility], [address] [place], [place] [address]  Troponin I     Status: None   Collection Time: 09/08/18  8:03 AM  Result Value Ref Range   Troponin I <0.03 <0.03 ng/mL    Comment: Performed at [facility], [address], [place],  [address]  Comprehensive metabolic panel     Status: Abnormal   Collection Time: 09/08/18  8:03 AM  Result Value Ref Range   Sodium 140 135 - 145 mmol/L   Potassium 4.5 3.5 - 5.1 mmol/L   Chloride 113 (H) 98 - 111 mmol/L   CO2 19 (L) 22 - 32 mmol/L   Glucose, Bld 92 70 - 99 mg/dL   BUN 48 (H) 8 - 23 mg/dL   Creatinine, Ser 2.90 (H) 0.61 - 1.24 mg/dL   Calcium 8.4 (L) 8.9 - 10.3 mg/dL   Total Protein 6.7 6.5 - 8.1 g/dL   Albumin 2.7 (L) 3.5 - 5.0 g/dL   AST  27 15 - 41 U/L   ALT 23 0 - 44 U/L   Alkaline Phosphatase 76 38 - 126 U/L   Total Bilirubin 0.3 0.3 - 1.2 mg/dL   GFR calc non Af  Amer 19 (L) >60 mL/min   GFR calc Af Amer 22 (L) >60 mL/min    Comment: (NOTE) The eGFR has been calculated using the CKD EPI equation. This calculation has not been validated in all clinical situations. eGFR's persistently <60 mL/min signify possible Chronic Kidney Disease.    Anion gap 8 5 - 15    Comment: Performed at [facility], [address], [place], [place] [address]  CBC     Status: Abnormal   Collection Time: 09/08/18  8:03 AM  Result Value Ref Range   WBC 9.2 4.0 - 10.5 K/uL   RBC 2.88 (L) 4.22 - 5.81 MIL/uL   Hemoglobin 8.2 (L) 13.0 - 17.0 g/dL   HCT 26.0 (L) 39.0 - 52.0 %   MCV 90.3 80.0 - 100.0 fL   MCH 28.5 26.0 - 34.0 pg   MCHC 31.5 30.0 - 36.0 g/dL   RDW 16.1 (H) 11.5 - 15.5 %   Platelets 211 150 - 400 K/uL    Comment: Performed at [facility], [address] [place], [place] [address]    ABGS No results for input(s): PHART, PO2ART, TCO2, HCO3 in the last 72 hours.  Invalid input(s): PCO2 CULTURES Recent Results (from the past 240 hour(s))  Aerobic Culture (superficial specimen)     Status: None   Collection Time: 08/30/18 11:10 AM  Result Value Ref Range Status   Specimen Description   Final    PERITONEAL Performed at [facility], [address] [place], [place] [address]    Special Requests   Final    LEFT Performed at [facility], [address], [place], [place] [address]    Gram Stain   Final    NO WBC SEEN MODERATE GRAM NEGATIVE COCCOBACILLI Performed at [facility], [place] [address] [place], [place] [address]    Culture   Final    ABUNDANT METHICILLIN RESISTANT STAPHYLOCOCCUS AUREUS   Report Status 09/02/2018 FINAL  Final   Organism ID, Bacteria METHICILLIN RESISTANT STAPHYLOCOCCUS AUREUS  Final      Susceptibility   Methicillin resistant staphylococcus aureus - MIC*    CIPROFLOXACIN >=8 RESISTANT Resistant     ERYTHROMYCIN >=8 RESISTANT Resistant     GENTAMICIN <=0.5 SENSITIVE Sensitive     OXACILLIN RESISTANT Resistant      TETRACYCLINE >=16 RESISTANT Resistant     VANCOMYCIN 1 SENSITIVE Sensitive     TRIMETH/SULFA <=10 SENSITIVE Sensitive     CLINDAMYCIN RESISTANT Resistant     RIFAMPIN <=0.5 SENSITIVE Sensitive     Inducible Clindamycin POSITIVE Resistant     * ABUNDANT METHICILLIN RESISTANT STAPHYLOCOCCUS AUREUS  Aerobic Culture (superficial specimen)     Status: None   Collection Time: 08/30/18 11:15 AM  Result Value Ref Range Status   Specimen Description   Final    PERITONEAL Performed at [facility], [address], [place], [place] [address]    Special Requests   Final    NONE Performed at [facility], [address], [place], [place] [address]    Gram Stain   Final    NO WBC SEEN FEW GRAM NEGATIVE COCCOBACILLI Performed at [facility], [place] [address], [place],  [address]    Culture   Final    ABUNDANT METHICILLIN  RESISTANT STAPHYLOCOCCUS AUREUS   Report Status 09/02/2018 FINAL  Final   Organism ID, Bacteria METHICILLIN RESISTANT STAPHYLOCOCCUS AUREUS  Final      Susceptibility   Methicillin resistant staphylococcus aureus - MIC*    CIPROFLOXACIN >=8 RESISTANT Resistant     ERYTHROMYCIN >=8 RESISTANT Resistant     GENTAMICIN <=0.5 SENSITIVE Sensitive     OXACILLIN >=4 RESISTANT Resistant     TETRACYCLINE >=16 RESISTANT Resistant     VANCOMYCIN <=0.5 SENSITIVE Sensitive     TRIMETH/SULFA <=10 SENSITIVE Sensitive     CLINDAMYCIN RESISTANT Resistant     RIFAMPIN <=0.5 SENSITIVE Sensitive     Inducible Clindamycin POSITIVE Resistant     * ABUNDANT METHICILLIN RESISTANT STAPHYLOCOCCUS AUREUS  Aerobic Culture (superficial specimen)     Status: None   Collection Time: 08/30/18 11:30 AM  Result Value Ref Range Status   Specimen Description   Final    SCROTUM Performed at [facility], [address], [place], [place] [address]    Special Requests   Final    NONE Performed at [facility], [address], [place], [place] [address]    Gram Stain   Final    RARE WBC  PRESENT, PREDOMINANTLY PMN FEW GRAM POSITIVE COCCI FEW GRAM NEGATIVE RODS Performed at [facility], [place] [address], [place], [place] [address]    Culture   Final    ABUNDANT METHICILLIN RESISTANT STAPHYLOCOCCUS AUREUS ABUNDANT ESCHERICHIA COLI    Report Status 09/02/2018 FINAL  Final   Organism ID, Bacteria ESCHERICHIA COLI  Final   Organism ID, Bacteria METHICILLIN RESISTANT STAPHYLOCOCCUS AUREUS  Final      Susceptibility   Escherichia coli - MIC*    AMPICILLIN >=32 RESISTANT Resistant     CEFAZOLIN >=64 RESISTANT Resistant     CEFEPIME <=1 SENSITIVE Sensitive     CEFTAZIDIME <=1 SENSITIVE Sensitive     CEFTRIAXONE <=1 SENSITIVE Sensitive     CIPROFLOXACIN <=0.25 SENSITIVE Sensitive     GENTAMICIN <=1 SENSITIVE Sensitive     IMIPENEM <=0.25 SENSITIVE Sensitive     TRIMETH/SULFA <=20 SENSITIVE Sensitive     AMPICILLIN/SULBACTAM >=32 RESISTANT Resistant     PIP/TAZO 8 SENSITIVE Sensitive     Extended ESBL NEGATIVE Sensitive     * ABUNDANT ESCHERICHIA COLI   Methicillin resistant staphylococcus aureus - MIC*    CIPROFLOXACIN >=8 RESISTANT Resistant     ERYTHROMYCIN >=8 RESISTANT Resistant     GENTAMICIN <=0.5 SENSITIVE Sensitive     OXACILLIN RESISTANT Resistant     TETRACYCLINE >=16 RESISTANT Resistant     VANCOMYCIN <=0.5 SENSITIVE Sensitive     TRIMETH/SULFA <=10 SENSITIVE Sensitive     CLINDAMYCIN <=0.25 SENSITIVE Sensitive     RIFAMPIN <=0.5 SENSITIVE Sensitive     Inducible Clindamycin NEGATIVE Sensitive     * ABUNDANT METHICILLIN RESISTANT STAPHYLOCOCCUS AUREUS   Studies/Results: Dg Elbow Complete Right (3+view)  Result Date: 09/07/2018 CLINICAL DATA:  FALL TODAY.  RIGHT ELBOW PAIN. EXAM: RIGHT ELBOW - COMPLETE 3+ VIEW COMPARISON:  None. FINDINGS: Soft tissue abrasion is noted posterior to the olecranon. There is no underlying fracture or foreign body. There is no joint effusion. The joint is located. IMPRESSION: 1. Superficial soft tissue abrasion without  underlying fracture. Electronically Signed   By: San Morelle M.D.   On: 09/07/2018 12:46   Ct Head Wo Contrast  Result Date: 09/07/2018 CLINICAL DATA:  84 year old male found sitting on floor this morning. Felt dizzy and fell. Initial encounter. EXAM: CT  HEAD WITHOUT CONTRAST TECHNIQUE: Contiguous axial images were obtained from the base of the skull through the vertex without intravenous contrast. COMPARISON:  09/07/2018 neck CT.  08/20/2011 head CT. FINDINGS: Brain: No intracranial hemorrhage or CT evidence of large acute infarct. Moderate chronic microvascular changes. Moderate global atrophy. No intracranial mass lesion noted on this unenhanced exam. Vascular: No hyperdense vessel.  Vascular calcifications. Skull: No skull fracture Sinuses/Orbits: No acute orbital abnormality. Minimal partial opacification inferior aspect right maxillary sinus. Other: Mastoid air cells and middle ear cavities are clear. IMPRESSION: 1. No skull fracture or intracranial hemorrhage. 2. No CT evidence of large acute infarct. 3. Chronic microvascular changes. 4. Global atrophy. Electronically Signed   By: Genia Del M.D.   On: 09/07/2018 18:30   Ct Soft Tissue Neck Wo Contrast  Result Date: 09/07/2018 CLINICAL DATA:  Neck mass.  Solitary, afebrile. EXAM: CT NECK WITHOUT CONTRAST TECHNIQUE: Multidetector CT imaging of the neck was performed following the standard protocol without intravenous contrast. COMPARISON:  None. FINDINGS: Pharynx and larynx: Normal. No mass or swelling. Salivary glands: Atrophic right parotid gland. Left parotid normal. Submandibular glands mildly atrophic bilaterally. Negative for mass or calculus. Thyroid: Negative Lymph nodes: Negative Vascular: Atherosclerotic disease. Limited evaluation of the vessels without intravenous contrast. Limited intracranial: Negative Visualized orbits: Not imaged Mastoids and visualized paranasal sinuses: Mild mucosal edema right maxillary sinus. Otherwise  clear. Mastoid clear bilaterally. Skeleton: Cervical spondylosis C5-6 and C6-7. No acute skeletal abnormality. Upper chest: Lung apices clear bilaterally. Other: None IMPRESSION: Negative for mass or adenopathy in the neck. No acute abnormality identified Atherosclerotic disease. Atrophic right parotid gland and bilateral submandibular glands without acute abnormality. Electronically Signed   By: Franchot Gallo M.D.   On: 09/07/2018 13:25   US Renal  Result Date: 09/07/2018 CLINICAL DATA:  Acute renal failure EXAM: RENAL / URINARY TRACT ULTRASOUND COMPLETE COMPARISON:  CT abdomen and pelvis 07/19/2018 FINDINGS: Right Kidney: Length: 9.0 cm. Normal cortical thickness. Upper normal cortical echogenicity. No mass or shadowing calcification. Mild collecting system dilatation, similar to prior CT. Left Kidney: Length: 9.5 cm. Cortical thinning. Normal cortical echogenicity. No mass, hydronephrosis or shadowing calcification. Mild LEFT renal collecting system dilatation seen on the prior CT exam not definitely visualized on current ultrasound. Bladder: Probable dependent debris.  No focal mass. IMPRESSION: Mild collecting system dilatation of the RIGHT kidney similar to that seen on prior CT exam. Probable dependent debris within urinary bladder. Electronically Signed   By: Lavonia Dana M.D.   On: 09/07/2018 16:46   Dg Chest Port 1 View  Result Date: 09/07/2018 CLINICAL DATA:  Weakness, fall EXAM: PORTABLE CHEST 1 VIEW COMPARISON:  06/11/2017, 04/29/2018 FINDINGS: There is right basilar scarring with tenting of the diaphragm. There is mild lingular scarring. There is no focal consolidation. There is no pleural effusion or pneumothorax. The heart and mediastinal contours are unremarkable. The osseous structures are unremarkable. IMPRESSION: No acute cardiopulmonary disease. Electronically Signed   By: Kathreen Devoid   On: 09/07/2018 12:50    Medications:  Prior to Admission:  Medications Prior to Admission   Medication Sig Dispense Refill Last Dose  . acetaminophen (TYLENOL) 500 MG tablet Take 1,000 mg by mouth every 8 (eight) hours as needed for mild pain or moderate pain. For headache pain     at [facility]  . ALFALFA PO Take 250 mg by mouth daily.    09/06/2018 at Unknown time  . amLODipine (NORVASC) 5 MG tablet Take 5 mg by mouth daily.  09/06/2018 at Unknown time  . doxycycline (VIBRAMYCIN) 100 MG capsule Take 100 mg by mouth 2 (two) times daily.    Past Week at Unknown time  . fish oil-omega-3 fatty acids 1000 MG capsule Take 1 g by mouth 2 (two) times daily.    09/06/2018 at Unknown time  . Multiple Vitamin (MULTIVITAMIN WITH MINERALS) TABS tablet Take 1 tablet by mouth daily.   09/06/2018 at Unknown time  . rosuvastatin (CRESTOR) 10 MG tablet Take 10 mg by mouth daily.    09/06/2018 at Unknown time  . sertraline (ZOLOFT) 100 MG tablet Take 100 mg by mouth daily.   09/06/2018 at Unknown time  . Influenza Vac Split Quad (FLUARIX QUADRIVALENT IM) Inject 0.5 mLs into the muscle once.   Completed Course at Unknown time   Scheduled: . amLODipine  5 mg Oral Daily  . feeding supplement (PRO-STAT SUGAR FREE 64)  30 mL Oral BID  . heparin  5,000 Units Subcutaneous Q8H  . rosuvastatin  10 mg Oral q1800  . sertraline  100 mg Oral Daily   Continuous: . ceFEPime (MAXIPIME) IV Stopped (09/08/18 0006)   AJH:[id number] **OR** acetaminophen  Assesment: He had syncope.  He has a urinary tract infection and has had multiple episodes of problems with gross hematuria recently.  He is being treated for that.  I think he is mildly dehydrated  He has acute kidney injury on top of his chronic renal failure. Principal Problem:   Syncope Active Problems:   Essential hypertension   Paroxysmal A-fib (HCC)   Anemia   Acute lower UTI   ARF (acute renal failure) (HCC)    Plan: Continue current treatments.    LOS: 0 days   Jordie Skalsky L 09/08/2018, 8:57 AM

## 2018-09-09 LAB — BASIC METABOLIC PANEL
Anion gap: 7 (ref 5–15)
BUN: 42 mg/dL — ABNORMAL HIGH (ref 8–23)
CO2: 19 mmol/L — ABNORMAL LOW (ref 22–32)
Calcium: 8.2 mg/dL — ABNORMAL LOW (ref 8.9–10.3)
Chloride: 112 mmol/L — ABNORMAL HIGH (ref 98–111)
Creatinine, Ser: 2.52 mg/dL — ABNORMAL HIGH (ref 0.61–1.24)
GFR calc Af Amer: 26 mL/min — ABNORMAL LOW (ref >60)
GFR calc non Af Amer: 22 mL/min — ABNORMAL LOW (ref >60)
Glucose, Bld: 93 mg/dL (ref 70–99)
Potassium: 4.4 mmol/L (ref 3.5–5.1)
Sodium: 138 mmol/L (ref 135–145)

## 2018-09-09 LAB — URINE CULTURE: Culture: >=100000 — AB

## 2018-09-09 LAB — GLUCOSE, CAPILLARY: Glucose-Capillary: 97 mg/dL (ref 70–99)

## 2018-09-09 NOTE — Clinical Social Work Note (Signed)
Clinical Social Work Assessment  Patient Details  Name: [name] [name] MRN: [id number] Date of Birth: 07/05/1934  Date of referral:  09/09/18               Reason for consult:  Facility Placement                Permission sought to share information with:    Permission granted to share information::     Name::        Agency::     Relationship::     Contact Information:     Housing/Transportation Living arrangements for the past 2 months:  Single Family Home Source of Information:  Patient Patient Interpreter Needed:  None Criminal Activity/Legal Involvement Pertinent to Current Situation/Hospitalization:    Significant Relationships:  None Lives with:  Self Do you feel safe going back to the place where you live?  Yes Need for family participation in patient care:  No (Coment)  Care giving concerns: none identified.    Social Worker assessment / plan:  Patient is agreeable to short term rehab at [facility]. He stated that he had been at [facility] for three weeks and was only home for one day before he fell. He stated that he had nurses set up to come to his home. At baseline, patient ambulates with a walker. He states that he does not have family support.   Employment status:  Retired Forensic scientist:  Medicare PT Recommendations:  [place] / Referral to community resources:  [place]  Patient/Family's Response to care:  Patient is agreeable to rehab.   Patient/Family's Understanding of and Emotional Response to Diagnosis, Current Treatment, and Prognosis:  Patient understands his diagnosis, treatment and prognosis, and although he would like to go home is agreeable to SNF.   Emotional Assessment Appearance:  Appears stated age Attitude/Demeanor/Rapport:    Affect (typically observed):  Accepting, Calm Orientation:  Oriented to Self, Oriented to Place, Oriented to  Time, Oriented to Situation Alcohol / Substance use:  Not  Applicable Psych involvement (Current and /or in the community):  No (Comment)  Discharge Needs  Concerns to be addressed:  Discharge Planning Concerns Readmission within the last 30 days:  No Current discharge risk:  None Barriers to Discharge:  No Barriers Identified   Ihor Gully, LCSW 09/09/2018, 2:08 PM

## 2018-09-09 NOTE — Progress Notes (Signed)
Subjective: He says he feels a little better.  He was admitted with syncope and struck the left side of his face.  Work-up so far is negative as far as acute coronary syndrome no evidence of stroke.  He has another urinary tract infection this time Pseudomonas which is multiply resistant and I will discuss with pharmacy.  He was recommended that he go back to skilled care facility by physical therapy and he is considering that.  Objective: Vital signs in last 24 hours: Temp:  [98.6 F (37 C)-99.3 F (37.4 C)] 98.7 F (37.1 C) (10/09 0555) Pulse Rate:  [69-75] 69 (10/09 0555) Resp:  [16-18] 16 (10/09 0555) BP: (142-149)/(52-64) 142/61 (10/09 0555) SpO2:  [98 %-100 %] 98 % (10/09 0859) Weight:  [78.9 kg] 78.9 kg (10/09 0555) Weight change: 4.057 kg Last BM Date: 09/08/18  Intake/Output from previous day: 10/08 0701 - 10/09 0700 In: 853.3 [P.O.:480; I.V.:123.3] Out: 900 [Urine:900]  PHYSICAL EXAM General appearance: alert, cooperative and no distress Resp: clear to auscultation bilaterally Cardio: regular rate and rhythm, S1, S2 normal, no murmur, click, rub or gallop GI: soft, non-tender; bowel sounds normal; no masses,  no organomegaly Extremities: extremities normal, atraumatic, no cyanosis or edema  Lab Results:  Results for orders placed or performed during the hospital encounter of 09/07/18 (from the past 48 hour(s))  CBG monitoring, ED     Status: Abnormal   Collection Time: 09/07/18 10:34 AM  Result Value Ref Range   Glucose-Capillary 115 (H) 70 - 99 mg/dL  CK     Status: None   Collection Time: 09/07/18 12:28 PM  Result Value Ref Range   Total CK 52 49 - 397 U/L    Comment: Performed at [facility], [address] [place], [place] [address]  Comprehensive metabolic panel     Status: Abnormal   Collection Time: 09/07/18 12:28 PM  Result Value Ref Range   Sodium 140 135 - 145 mmol/L   Potassium 4.4 3.5 - 5.1 mmol/L   Chloride 112 (H) 98 - 111 mmol/L   CO2 18 (L) 22  - 32 mmol/L   Glucose, Bld 110 (H) 70 - 99 mg/dL   BUN 53 (H) 8 - 23 mg/dL   Creatinine, Ser 3.39 (H) 0.61 - 1.24 mg/dL   Calcium 8.6 (L) 8.9 - 10.3 mg/dL   Total Protein 6.7 6.5 - 8.1 g/dL   Albumin 2.8 (L) 3.5 - 5.0 g/dL   AST 26 15 - 41 U/L   ALT 28 0 - 44 U/L   Alkaline Phosphatase 69 38 - 126 U/L   Total Bilirubin 0.5 0.3 - 1.2 mg/dL   GFR calc non Af Amer 16 (L) >60 mL/min   GFR calc Af Amer 18 (L) >60 mL/min    Comment: (NOTE) The eGFR has been calculated using the CKD EPI equation. This calculation has not been validated in all clinical situations. eGFR's persistently <60 mL/min signify possible Chronic Kidney Disease.    Anion gap 10 5 - 15    Comment: Performed at [facility], [address] [place], [place] [address]  CBC with Differential/Platelet     Status: Abnormal   Collection Time: 09/07/18 12:28 PM  Result Value Ref Range   WBC 11.1 (H) 4.0 - 10.5 K/uL   RBC 2.76 (L) 4.22 - 5.81 MIL/uL   Hemoglobin 7.9 (L) 13.0 - 17.0 g/dL   HCT 24.4 (L) 39.0 - 52.0 %   MCV 88.4 78.0 - 100.0 fL   MCH  28.6 26.0 - 34.0 pg   MCHC 32.4 30.0 - 36.0 g/dL   RDW 15.9 (H) 11.5 - 15.5 %   Platelets 208 150 - 400 K/uL   Neutrophils Relative % 87 %   Neutro Abs 9.7 (H) 1.7 - 7.7 K/uL   Lymphocytes Relative 7 %   Lymphs Abs 0.8 0.7 - 4.0 K/uL   Monocytes Relative 6 %   Monocytes Absolute 0.6 0.1 - 1.0 K/uL   Eosinophils Relative 0 %   Eosinophils Absolute 0.0 0.0 - 0.7 K/uL   Basophils Relative 0 %   Basophils Absolute 0.0 0.0 - 0.1 K/uL    Comment: Performed at [facility], [address], [place], [place] [address]  I-Stat CG4 Lactic Acid, ED     Status: None   Collection Time: 09/07/18 12:55 PM  Result Value Ref Range   Lactic Acid, Venous 0.84 0.5 - 1.9 mmol/L  Urinalysis, Routine w reflex microscopic     Status: Abnormal   Collection Time: 09/07/18  2:04 PM  Result Value Ref Range   Color, Urine YELLOW YELLOW   APPearance CLOUDY (A) CLEAR   Specific Gravity, Urine 1.013  1.005 - 1.030   pH 5.0 5.0 - 8.0   Glucose, UA NEGATIVE NEGATIVE mg/dL   Hgb urine dipstick MODERATE (A) NEGATIVE   Bilirubin Urine NEGATIVE NEGATIVE   Ketones, ur NEGATIVE NEGATIVE mg/dL   Protein, ur 100 (A) NEGATIVE mg/dL   Nitrite POSITIVE (A) NEGATIVE   Leukocytes, UA LARGE (A) NEGATIVE   WBC, UA >50 (H) 0 - 5 WBC/hpf   Bacteria, UA RARE (A) NONE SEEN   Squamous Epithelial / LPF 0-5 0 - 5   WBC Clumps PRESENT    Mucus PRESENT    Budding Yeast PRESENT    Ca Oxalate Crys, UA PRESENT     Comment: Performed at [facility], [address], [place], [place] [address]  Urine Culture     Status: Abnormal   Collection Time: 09/07/18  2:04 PM  Result Value Ref Range   Specimen Description      URINE, CLEAN CATCH Performed at [facility], [address] [place], [place] [address]    Special Requests      NONE Performed at [facility], [address], [place], [place] [address]    Culture >=100,000 COLONIES/mL PSEUDOMONAS AERUGINOSA (A)    Report Status 09/09/2018 FINAL    Organism ID, Bacteria PSEUDOMONAS AERUGINOSA (A)       Susceptibility   Pseudomonas aeruginosa - MIC*    CEFTAZIDIME 8 SENSITIVE Sensitive     CIPROFLOXACIN >=4 RESISTANT Resistant     GENTAMICIN 4 SENSITIVE Sensitive     IMIPENEM >=16 RESISTANT Resistant     CEFEPIME 8 SENSITIVE Sensitive     * >=100,000 COLONIES/mL PSEUDOMONAS AERUGINOSA  POC occult blood, ED Provider will collect     Status: Abnormal   Collection Time: 09/07/18  2:39 PM  Result Value Ref Range   Fecal Occult Bld POSITIVE (A) NEGATIVE  Troponin I     Status: None   Collection Time: 09/07/18  7:16 PM  Result Value Ref Range   Troponin I <0.03 <0.03 ng/mL    Comment: Performed at [facility], [address], [place], [place] [address]  Troponin I     Status: None   Collection Time: 09/08/18  1:23 AM  Result Value Ref Range   Troponin I <0.03 <0.03 ng/mL    Comment: Performed at [facility], [address]  [place], [place] [address]   Troponin I     Status: None   Collection Time: 09/08/18  8:03 AM  Result Value Ref Range   Troponin I <0.03 <0.03 ng/mL    Comment: Performed at [facility], [address] [place], [place] [address]  Comprehensive metabolic panel     Status: Abnormal   Collection Time: 09/08/18  8:03 AM  Result Value Ref Range   Sodium 140 135 - 145 mmol/L   Potassium 4.5 3.5 - 5.1 mmol/L   Chloride 113 (H) 98 - 111 mmol/L   CO2 19 (L) 22 - 32 mmol/L   Glucose, Bld 92 70 - 99 mg/dL   BUN 48 (H) 8 - 23 mg/dL   Creatinine, Ser 2.90 (H) 0.61 - 1.24 mg/dL   Calcium 8.4 (L) 8.9 - 10.3 mg/dL   Total Protein 6.7 6.5 - 8.1 g/dL   Albumin 2.7 (L) 3.5 - 5.0 g/dL   AST 27 15 - 41 U/L   ALT 23 0 - 44 U/L   Alkaline Phosphatase 76 38 - 126 U/L   Total Bilirubin 0.3 0.3 - 1.2 mg/dL   GFR calc non Af Amer 19 (L) >60 mL/min   GFR calc Af Amer 22 (L) >60 mL/min    Comment: (NOTE) The eGFR has been calculated using the CKD EPI equation. This calculation has not been validated in all clinical situations. eGFR's persistently <60 mL/min signify possible Chronic Kidney Disease.    Anion gap 8 5 - 15    Comment: Performed at [facility], [address], [place], [place] [address]  CBC     Status: Abnormal   Collection Time: 09/08/18  8:03 AM  Result Value Ref Range   WBC 9.2 4.0 - 10.5 K/uL   RBC 2.88 (L) 4.22 - 5.81 MIL/uL   Hemoglobin 8.2 (L) 13.0 - 17.0 g/dL   HCT 26.0 (L) 39.0 - 52.0 %   MCV 90.3 80.0 - 100.0 fL   MCH 28.5 26.0 - 34.0 pg   MCHC 31.5 30.0 - 36.0 g/dL   RDW 16.1 (H) 11.5 - 15.5 %   Platelets 211 150 - 400 K/uL    Comment: Performed at [facility], [address] [place], [place] [address]  Basic metabolic panel     Status: Abnormal   Collection Time: 09/09/18  5:29 AM  Result Value Ref Range   Sodium 138 135 - 145 mmol/L   Potassium 4.4 3.5 - 5.1 mmol/L   Chloride 112 (H) 98 - 111 mmol/L   CO2 19 (L) 22 - 32 mmol/L   Glucose, Bld 93 70 - 99 mg/dL   BUN 42 (H) 8 - 23 mg/dL    Creatinine, Ser 2.52 (H) 0.61 - 1.24 mg/dL   Calcium 8.2 (L) 8.9 - 10.3 mg/dL   GFR calc non Af Amer 22 (L) >60 mL/min   GFR calc Af Amer 26 (L) >60 mL/min    Comment: (NOTE) The eGFR has been calculated using the CKD EPI equation. This calculation has not been validated in all clinical situations. eGFR's persistently <60 mL/min signify possible Chronic Kidney Disease.    Anion gap 7 5 - 15    Comment: Performed at [facility], [address] [place], [place] [address]    ABGS No results for input(s): PHART, PO2ART, TCO2, HCO3 in the last 72 hours.  Invalid input(s): PCO2 CULTURES Recent Results (from the past 240 hour(s))  Aerobic Culture (superficial specimen)     Status: None  Collection Time: 08/30/18 11:10 AM  Result Value Ref Range Status   Specimen Description   Final    PERITONEAL Performed at [facility], [address] [place], [place] [address]    Special Requests   Final    LEFT Performed at [facility], [address] [place], [place] [address]    Gram Stain   Final    NO WBC SEEN MODERATE GRAM NEGATIVE COCCOBACILLI Performed at [facility], [place] [address] [place], [place] [address]    Culture   Final    ABUNDANT METHICILLIN RESISTANT STAPHYLOCOCCUS AUREUS   Report Status 09/02/2018 FINAL  Final   Organism ID, Bacteria METHICILLIN RESISTANT STAPHYLOCOCCUS AUREUS  Final      Susceptibility   Methicillin resistant staphylococcus aureus - MIC*    CIPROFLOXACIN >=8 RESISTANT Resistant     ERYTHROMYCIN >=8 RESISTANT Resistant     GENTAMICIN <=0.5 SENSITIVE Sensitive     OXACILLIN RESISTANT Resistant     TETRACYCLINE >=16 RESISTANT Resistant     VANCOMYCIN 1 SENSITIVE Sensitive     TRIMETH/SULFA <=10 SENSITIVE Sensitive     CLINDAMYCIN RESISTANT Resistant     RIFAMPIN <=0.5 SENSITIVE Sensitive     Inducible Clindamycin POSITIVE Resistant     * ABUNDANT METHICILLIN RESISTANT STAPHYLOCOCCUS AUREUS  Aerobic Culture (superficial specimen)      Status: None   Collection Time: 08/30/18 11:15 AM  Result Value Ref Range Status   Specimen Description   Final    PERITONEAL Performed at [facility], [address], [place], [place] [address]    Special Requests   Final    NONE Performed at [facility], [address], [place], [place] [address]    Gram Stain   Final    NO WBC SEEN FEW GRAM NEGATIVE COCCOBACILLI Performed at [facility], [place] [address] [place], [place] [address]    Culture   Final    ABUNDANT METHICILLIN RESISTANT STAPHYLOCOCCUS AUREUS   Report Status 09/02/2018 FINAL  Final   Organism ID, Bacteria METHICILLIN RESISTANT STAPHYLOCOCCUS AUREUS  Final      Susceptibility   Methicillin resistant staphylococcus aureus - MIC*    CIPROFLOXACIN >=8 RESISTANT Resistant     ERYTHROMYCIN >=8 RESISTANT Resistant     GENTAMICIN <=0.5 SENSITIVE Sensitive     OXACILLIN >=4 RESISTANT Resistant     TETRACYCLINE >=16 RESISTANT Resistant     VANCOMYCIN <=0.5 SENSITIVE Sensitive     TRIMETH/SULFA <=10 SENSITIVE Sensitive     CLINDAMYCIN RESISTANT Resistant     RIFAMPIN <=0.5 SENSITIVE Sensitive     Inducible Clindamycin POSITIVE Resistant     * ABUNDANT METHICILLIN RESISTANT STAPHYLOCOCCUS AUREUS  Aerobic Culture (superficial specimen)     Status: None   Collection Time: 08/30/18 11:30 AM  Result Value Ref Range Status   Specimen Description   Final    SCROTUM Performed at [facility], [address], [place], [place] [address]    Special Requests   Final    NONE Performed at [facility], [address], [place],  [address]    Gram Stain   Final    RARE WBC PRESENT, PREDOMINANTLY PMN FEW GRAM POSITIVE COCCI FEW GRAM NEGATIVE RODS Performed at [facility], [place] [address], [place],  [address]    Culture   Final    ABUNDANT METHICILLIN RESISTANT STAPHYLOCOCCUS AUREUS ABUNDANT ESCHERICHIA COLI    Report Status 09/02/2018 FINAL  Final   Organism ID, Bacteria ESCHERICHIA COLI  Final    Organism ID, Bacteria METHICILLIN RESISTANT STAPHYLOCOCCUS AUREUS  Final      Susceptibility   Escherichia coli - MIC*    AMPICILLIN >=32 RESISTANT Resistant     CEFAZOLIN >=64 RESISTANT Resistant     CEFEPIME <=1 SENSITIVE Sensitive     CEFTAZIDIME <=1 SENSITIVE Sensitive     CEFTRIAXONE <=1 SENSITIVE Sensitive     CIPROFLOXACIN <=0.25 SENSITIVE Sensitive     GENTAMICIN <=1 SENSITIVE Sensitive     IMIPENEM <=0.25 SENSITIVE Sensitive     TRIMETH/SULFA <=20 SENSITIVE Sensitive     AMPICILLIN/SULBACTAM >=32 RESISTANT Resistant     PIP/TAZO 8 SENSITIVE Sensitive     Extended ESBL NEGATIVE Sensitive     * ABUNDANT ESCHERICHIA COLI   Methicillin resistant staphylococcus aureus - MIC*    CIPROFLOXACIN >=8 RESISTANT Resistant     ERYTHROMYCIN >=8 RESISTANT Resistant     GENTAMICIN <=0.5 SENSITIVE Sensitive     OXACILLIN RESISTANT Resistant     TETRACYCLINE >=16 RESISTANT Resistant     VANCOMYCIN <=0.5 SENSITIVE Sensitive     TRIMETH/SULFA <=10 SENSITIVE Sensitive     CLINDAMYCIN <=0.25 SENSITIVE Sensitive     RIFAMPIN <=0.5 SENSITIVE Sensitive     Inducible Clindamycin NEGATIVE Sensitive     * ABUNDANT METHICILLIN RESISTANT STAPHYLOCOCCUS AUREUS  Urine Culture     Status: Abnormal   Collection Time: 09/07/18  2:04 PM  Result Value Ref Range Status   Specimen Description   Final    URINE, CLEAN CATCH Performed at [facility], [address] [place], [place] [address]    Special Requests   Final    NONE Performed at [facility], [address] [place], [place] [address]    Culture >=100,000 COLONIES/mL PSEUDOMONAS AERUGINOSA (A)  Final   Report Status 09/09/2018 FINAL  Final   Organism ID, Bacteria PSEUDOMONAS AERUGINOSA (A)  Final      Susceptibility   Pseudomonas aeruginosa - MIC*    CEFTAZIDIME 8 SENSITIVE Sensitive     CIPROFLOXACIN >=4 RESISTANT Resistant     GENTAMICIN 4 SENSITIVE Sensitive     IMIPENEM >=16 RESISTANT Resistant     CEFEPIME 8 SENSITIVE Sensitive     *  >=100,000 COLONIES/mL PSEUDOMONAS AERUGINOSA   Studies/Results: Dg Elbow Complete Right (3+view)  Result Date: 09/07/2018 CLINICAL DATA:  FALL TODAY.  RIGHT ELBOW PAIN. EXAM: RIGHT ELBOW - COMPLETE 3+ VIEW COMPARISON:  None. FINDINGS: Soft tissue abrasion is noted posterior to the olecranon. There is no underlying fracture or foreign body. There is no joint effusion. The joint is located. IMPRESSION: 1. Superficial soft tissue abrasion without underlying fracture. Electronically Signed   By: San Morelle M.D.   On: 09/07/2018 12:46   Ct Head Wo Contrast  Result Date: 09/07/2018 CLINICAL DATA:  84 year old male found sitting on floor this morning. Felt dizzy and fell. Initial encounter. EXAM: CT HEAD WITHOUT CONTRAST TECHNIQUE: Contiguous axial images were obtained from the base of the skull through the vertex without intravenous contrast. COMPARISON:  09/07/2018 neck CT.  08/20/2011 head CT. FINDINGS: Brain: No intracranial hemorrhage or CT evidence of large acute infarct. Moderate chronic microvascular changes. Moderate global atrophy. No intracranial mass lesion noted on this unenhanced exam. Vascular: No hyperdense vessel.  Vascular calcifications. Skull: No skull fracture Sinuses/Orbits: No acute orbital abnormality. Minimal partial opacification inferior aspect right maxillary sinus. Other: Mastoid air cells and middle ear cavities are clear. IMPRESSION: 1. No skull fracture or intracranial hemorrhage. 2. No CT evidence of large  acute infarct. 3. Chronic microvascular changes. 4. Global atrophy. Electronically Signed   By: Genia Del M.D.   On: 09/07/2018 18:30   Ct Soft Tissue Neck Wo Contrast  Result Date: 09/07/2018 CLINICAL DATA:  Neck mass.  Solitary, afebrile. EXAM: CT NECK WITHOUT CONTRAST TECHNIQUE: Multidetector CT imaging of the neck was performed following the standard protocol without intravenous contrast. COMPARISON:  None. FINDINGS: Pharynx and larynx: Normal. No mass or  swelling. Salivary glands: Atrophic right parotid gland. Left parotid normal. Submandibular glands mildly atrophic bilaterally. Negative for mass or calculus. Thyroid: Negative Lymph nodes: Negative Vascular: Atherosclerotic disease. Limited evaluation of the vessels without intravenous contrast. Limited intracranial: Negative Visualized orbits: Not imaged Mastoids and visualized paranasal sinuses: Mild mucosal edema right maxillary sinus. Otherwise clear. Mastoid clear bilaterally. Skeleton: Cervical spondylosis C5-6 and C6-7. No acute skeletal abnormality. Upper chest: Lung apices clear bilaterally. Other: None IMPRESSION: Negative for mass or adenopathy in the neck. No acute abnormality identified Atherosclerotic disease. Atrophic right parotid gland and bilateral submandibular glands without acute abnormality. Electronically Signed   By: Franchot Gallo M.D.   On: 09/07/2018 13:25   US Renal  Result Date: 09/07/2018 CLINICAL DATA:  Acute renal failure EXAM: RENAL / URINARY TRACT ULTRASOUND COMPLETE COMPARISON:  CT abdomen and pelvis 07/19/2018 FINDINGS: Right Kidney: Length: 9.0 cm. Normal cortical thickness. Upper normal cortical echogenicity. No mass or shadowing calcification. Mild collecting system dilatation, similar to prior CT. Left Kidney: Length: 9.5 cm. Cortical thinning. Normal cortical echogenicity. No mass, hydronephrosis or shadowing calcification. Mild LEFT renal collecting system dilatation seen on the prior CT exam not definitely visualized on current ultrasound. Bladder: Probable dependent debris.  No focal mass. IMPRESSION: Mild collecting system dilatation of the RIGHT kidney similar to that seen on prior CT exam. Probable dependent debris within urinary bladder. Electronically Signed   By: Lavonia Dana M.D.   On: 09/07/2018 16:46   US Carotid Bilateral  Result Date: 09/08/2018 CLINICAL DATA:  Syncopal episode. Altered mental status. History of CAD and hyperlipidemia. Former smoker.  EXAM: BILATERAL CAROTID DUPLEX ULTRASOUND TECHNIQUE: Pearline Cables scale imaging, color Doppler and duplex ultrasound were performed of bilateral carotid and vertebral arteries in the neck. COMPARISON:  None. FINDINGS: Criteria: Quantification of carotid stenosis is based on velocity parameters that correlate the residual internal carotid diameter with NASCET-based stenosis levels, using the diameter of the distal internal carotid lumen as the denominator for stenosis measurement. The following velocity measurements were obtained: RIGHT ICA:  168/35 cm/sec CCA:  3/87 cm/sec SYSTOLIC ICA/CCA RATIO:  1.5 ECA:  132 cm/sec LEFT ICA:  117/27 cm/sec CCA:  56/43 cm/sec SYSTOLIC ICA/CCA RATIO:  1.2 ECA:  152 cm/sec RIGHT CAROTID ARTERY: There is a minimal to moderate amount of echogenic plaque within the right carotid bulb (images 15 and 17), extending to involve the origin and proximal aspects of the right internal carotid artery (image 25), resulting in elevated peak systolic velocities within the proximal mid aspects the right internal carotid artery. Greatest acquired peak systolic velocity within mid aspect the right internal carotid artery measures 168 centimeters/second (image 31). RIGHT VERTEBRAL ARTERY:  Antegrade Flow LEFT CAROTID ARTERY: There is a moderate amount of echogenic plaque within the left carotid bulb (images 50 and 52). There is a minimal amount of echogenic plaque involving the origin and proximal aspect the left internal carotid artery (image 60), not resulting in elevated peak systolic velocities within the interrogated course the left internal carotid artery to suggest a hemodynamically significant stenosis. LEFT  VERTEBRAL ARTERY:  Antegrade Flow IMPRESSION: 1. Minimal to moderate amount of right-sided atherosclerotic plaque results in elevated peak systolic velocities within the right internal carotid artery compatible with the 50-69% luminal narrowing range. Further evaluation with CTA could performed as  clinically indicated. 2. Minimal to moderate amount of left-sided atherosclerotic plaque, not definitely resulting in a hemodynamically significant stenosis. Electronically Signed   By: Sandi Mariscal M.D.   On: 09/08/2018 12:57   Dg Chest Port 1 View  Result Date: 09/07/2018 CLINICAL DATA:  Weakness, fall EXAM: PORTABLE CHEST 1 VIEW COMPARISON:  06/11/2017, 04/29/2018 FINDINGS: There is right basilar scarring with tenting of the diaphragm. There is mild lingular scarring. There is no focal consolidation. There is no pleural effusion or pneumothorax. The heart and mediastinal contours are unremarkable. The osseous structures are unremarkable. IMPRESSION: No acute cardiopulmonary disease. Electronically Signed   By: Kathreen Devoid   On: 09/07/2018 12:50    Medications:  Prior to Admission:  Medications Prior to Admission  Medication Sig Dispense Refill Last Dose  . acetaminophen (TYLENOL) 500 MG tablet Take 1,000 mg by mouth every 8 (eight) hours as needed for mild pain or moderate pain. For headache pain     at [facility]  . ALFALFA PO Take 250 mg by mouth daily.    09/06/2018 at Unknown time  . amLODipine (NORVASC) 5 MG tablet Take 5 mg by mouth daily.   09/06/2018 at Unknown time  . doxycycline (VIBRAMYCIN) 100 MG capsule Take 100 mg by mouth 2 (two) times daily.    Past Week at Unknown time  . fish oil-omega-3 fatty acids 1000 MG capsule Take 1 g by mouth 2 (two) times daily.    09/06/2018 at Unknown time  . Multiple Vitamin (MULTIVITAMIN WITH MINERALS) TABS tablet Take 1 tablet by mouth daily.   09/06/2018 at Unknown time  . rosuvastatin (CRESTOR) 10 MG tablet Take 10 mg by mouth daily.    09/06/2018 at Unknown time  . sertraline (ZOLOFT) 100 MG tablet Take 100 mg by mouth daily.   09/06/2018 at Unknown time  . Influenza Vac Split Quad (FLUARIX QUADRIVALENT IM) Inject 0.5 mLs into the muscle once.   Completed Course at Unknown time   Scheduled: . amLODipine  5 mg Oral Daily  . feeding supplement  (PRO-STAT SUGAR FREE 64)  30 mL Oral BID  . heparin  5,000 Units Subcutaneous Q8H  . rosuvastatin  10 mg Oral q1800  . sertraline  100 mg Oral Daily   Continuous: . ceFEPime (MAXIPIME) IV 1 g (09/08/18 2204)   MHD:[id number] **OR** acetaminophen  Assesment: He was admitted with syncope.  He has multiple other medical problems including chronic renal failure and his renal function appears to be back to baseline.  He has hypertension which is fairly well controlled.  He has severe depression which is an ongoing issue.  He is had a lot of trouble with blood in his urine and clots.  He is doing okay with that now.  He is weak and it has been recommended that he go to skilled care facility.  He has urinary tract infection that may need IV treatment. Principal Problem:   Syncope Active Problems:   Essential hypertension   Paroxysmal A-fib (HCC)   Anemia   Acute lower UTI   ARF (acute renal failure) (HCC)    Plan: Continue treatments.  Consider nursing home placement.  Discussed with pharmacy regarding his Pseudomonas in the urine    LOS: 1 day  , L 09/09/2018, 9:06 AM

## 2018-09-09 NOTE — Care Management Important Message (Signed)
Important Message  Patient Details  Name: [name] [name] MRN: [id number] Date of Birth: 1934-02-07   Medicare Important Message Given:  Yes    Shelda Altes 09/09/2018, 11:43 AM

## 2018-09-09 NOTE — NC FL2 (Signed)
[place] LEVEL OF CARE SCREENING TOOL     IDENTIFICATION  Patient Name: [name] [name] Birthdate: October 07, 1934 Sex: male Admission Date (Current Location): 09/07/2018  [facility] and [place] Number:  [employer] and Address:  [place] [address] [place]      Provider Number: [id number]  Attending Physician Name and Address:  [name], MD  Relative Name and Phone Number:       Current Level of Care: Hospital Recommended Level of Care: [place] Prior Approval Number:    Date Approved/Denied:   PASRR Number: [id number]  Discharge Plan: SNF    Current Diagnoses: Patient Active Problem List   Diagnosis Date Noted  . Syncope 09/07/2018  . Acute lower UTI 09/07/2018  . ARF (acute renal failure) ([place]) 09/07/2018  . Gross hematuria 07/25/2018  . Sepsis due to urinary tract infection ([place]) 07/19/2018  . Paroxysmal A-fib ([place]) 07/19/2018  . Chronic kidney disease, stage IV (severe) ([place]) 07/19/2018  . Hyperlipidemia 08/05/2014  . Knee effusion, right 04/21/2012  . Essential hypertension 07/25/2011  . Anemia 08/27/2010  . CAROTID ARTERY DISEASE 06/14/2010  . CLOSED FRACTURE OF ACROMIAL END OF CLAVICLE 04/25/2010    Orientation RESPIRATION BLADDER Height & Weight     Self, Time, Situation, Place  Normal Incontinent Weight: 173 lb 15.1 oz (78.9 kg) Height:  5\' 8"  (172.7 cm)  BEHAVIORAL SYMPTOMS/MOOD NEUROLOGICAL BOWEL NUTRITION STATUS      Continent Diet(heart healthy)  AMBULATORY STATUS COMMUNICATION OF NEEDS Skin   Limited Assist Verbally Normal                       Personal Care Assistance Level of Assistance  Bathing, Feeding, Dressing Bathing Assistance: Limited assistance Feeding assistance: Independent Dressing Assistance: Limited assistance     Functional Limitations Info  Sight, Hearing, Speech Sight Info: Adequate Hearing Info: Adequate Speech Info: Adequate     SPECIAL CARE FACTORS FREQUENCY  PT (By licensed PT)     PT Frequency: 5x/week              Contractures Contractures Info: Not present    Additional Factors Info  Allergies, Psychotropic, Code Status Code Status Info: Full Code Allergies Info: Ivp Dye, Nalbuphine, Codeine,  Psychotropic Info: Zoloft         Current Medications (09/09/2018):  This is the current hospital active medication list Current Facility-Administered Medications  Medication Dose Route Frequency Provider Last Rate Last Dose  . acetaminophen (TYLENOL) tablet 650 mg  650 mg Oral Q6H PRN Jani Gravel, MD       Or  . acetaminophen (TYLENOL) suppository 650 mg  650 mg Rectal Q6H PRN Jani Gravel, MD      . amLODipine (NORVASC) tablet 5 mg  5 mg Oral Daily Jani Gravel, MD   5 mg at 09/09/18 0950  . ceFEPIme (MAXIPIME) 1 g in sodium chloride 0.9 % 100 mL IVPB  1 g Intravenous Q24H Jani Gravel, MD 200 mL/hr at 09/08/18 2204 1 g at 09/08/18 2204  . feeding supplement (PRO-STAT SUGAR FREE 64) liquid 30 mL  30 mL Oral BID Jani Gravel, MD   30 mL at 09/08/18 1056  . heparin injection 5,000 Units  5,000 Units Subcutaneous Q8H Jani Gravel, MD   5,000 Units at 09/09/18 [phone]  . rosuvastatin (CRESTOR) tablet 10 mg  10 mg Oral q1800 Jani Gravel, MD   10 mg at 09/08/18 1619  . sertraline (ZOLOFT)  tablet 100 mg  100 mg Oral Daily Jani Gravel, MD   100 mg at 09/09/18 1941     Discharge Medications: Please see discharge summary for a list of discharge medications.  Relevant Imaging Results:  Relevant Lab Results:   Additional Information SSN [id number]. Patient will likely need IV antibiotics at discharge.   Nikelle Malatesta, Clydene Pugh, LCSW

## 2018-09-10 ENCOUNTER — Encounter (HOSPITAL_COMMUNITY): Payer: Self-pay | Admitting: *Deleted

## 2018-09-10 ENCOUNTER — Inpatient Hospital Stay
Admission: RE | Admit: 2018-09-10 | Discharge: 2018-09-18 | Disposition: A | Discharge status: Home / Self Care | Payer: MEDICARE | Source: Ambulatory Visit | Attending: Internal Medicine | Admitting: Internal Medicine

## 2018-09-10 DIAGNOSIS — N289 Disorder of kidney and ureter, unspecified: Secondary | ICD-10-CM | POA: Diagnosis not present

## 2018-09-10 DIAGNOSIS — N390 Urinary tract infection, site not specified: Secondary | ICD-10-CM | POA: Diagnosis not present

## 2018-09-10 DIAGNOSIS — M6281 Muscle weakness (generalized): Secondary | ICD-10-CM | POA: Diagnosis not present

## 2018-09-10 DIAGNOSIS — E785 Hyperlipidemia, unspecified: Secondary | ICD-10-CM | POA: Diagnosis not present

## 2018-09-10 DIAGNOSIS — R279 Unspecified lack of coordination: Secondary | ICD-10-CM | POA: Diagnosis not present

## 2018-09-10 DIAGNOSIS — B965 Pseudomonas (aeruginosa) (mallei) (pseudomallei) as the cause of diseases classified elsewhere: Secondary | ICD-10-CM | POA: Diagnosis not present

## 2018-09-10 DIAGNOSIS — R262 Difficulty in walking, not elsewhere classified: Secondary | ICD-10-CM | POA: Diagnosis not present

## 2018-09-10 DIAGNOSIS — I10 Essential (primary) hypertension: Secondary | ICD-10-CM | POA: Diagnosis not present

## 2018-09-10 DIAGNOSIS — S50311D Abrasion of right elbow, subsequent encounter: Secondary | ICD-10-CM | POA: Diagnosis not present

## 2018-09-10 DIAGNOSIS — I480 Paroxysmal atrial fibrillation: Secondary | ICD-10-CM | POA: Diagnosis not present

## 2018-09-10 DIAGNOSIS — F329 Major depressive disorder, single episode, unspecified: Secondary | ICD-10-CM | POA: Diagnosis not present

## 2018-09-10 DIAGNOSIS — K219 Gastro-esophageal reflux disease without esophagitis: Secondary | ICD-10-CM | POA: Diagnosis not present

## 2018-09-10 DIAGNOSIS — E860 Dehydration: Secondary | ICD-10-CM | POA: Diagnosis not present

## 2018-09-10 DIAGNOSIS — A419 Sepsis, unspecified organism: Secondary | ICD-10-CM | POA: Diagnosis not present

## 2018-09-10 DIAGNOSIS — D6489 Other specified anemias: Secondary | ICD-10-CM | POA: Diagnosis not present

## 2018-09-10 DIAGNOSIS — S01412D Laceration without foreign body of left cheek and temporomandibular area, subsequent encounter: Secondary | ICD-10-CM | POA: Diagnosis not present

## 2018-09-10 DIAGNOSIS — R42 Dizziness and giddiness: Secondary | ICD-10-CM | POA: Diagnosis not present

## 2018-09-10 DIAGNOSIS — Z978 Presence of other specified devices: Secondary | ICD-10-CM | POA: Diagnosis not present

## 2018-09-10 DIAGNOSIS — N184 Chronic kidney disease, stage 4 (severe): Secondary | ICD-10-CM | POA: Diagnosis not present

## 2018-09-10 DIAGNOSIS — R55 Syncope and collapse: Secondary | ICD-10-CM | POA: Diagnosis not present

## 2018-09-10 DIAGNOSIS — N12 Tubulo-interstitial nephritis, not specified as acute or chronic: Secondary | ICD-10-CM | POA: Diagnosis not present

## 2018-09-10 DIAGNOSIS — N3021 Other chronic cystitis with hematuria: Secondary | ICD-10-CM | POA: Diagnosis not present

## 2018-09-10 DIAGNOSIS — D631 Anemia in chronic kidney disease: Secondary | ICD-10-CM | POA: Diagnosis not present

## 2018-09-10 DIAGNOSIS — Z9181 History of falling: Secondary | ICD-10-CM | POA: Diagnosis not present

## 2018-09-10 MED ORDER — SODIUM CHLORIDE 0.9 % IV SOLN: 1.0000 g | INTRAVENOUS | 0 refills | Status: DC

## 2018-09-10 NOTE — Clinical Social Work Placement (Signed)
   CLINICAL SOCIAL WORK PLACEMENT  NOTE  Date:  09/10/2018  Patient Details  Name: [name] [name] MRN: [id number] Date of Birth: May 17, 1934  Clinical Social Work is seeking post-discharge placement for this patient at the [place] level of care (*CSW will initial, date and re-position this form in  chart as items are completed):  Yes   Patient/family provided with [place] Work Department's list of facilities offering this level of care within the geographic area requested by the patient (or if unable, by the patient's family).  Yes   Patient/family informed of their freedom to choose among providers that offer the needed level of care, that participate in Medicare, Medicaid or managed care program needed by the patient, have an available bed and are willing to accept the patient.  Yes   Patient/family informed of [place]'s ownership interest in [facility] and [facility], as well as of the fact that they are under no obligation to receive care at these facilities.  PASRR submitted to EDS on       PASRR number received on       Existing PASRR number confirmed on 09/09/18     FL2 transmitted to all facilities in geographic area requested by pt/family on 09/09/18     FL2 transmitted to all facilities within larger geographic area on       Patient informed that his/her managed care company has contracts with or will negotiate with certain facilities, including the following:        Yes   Patient/family informed of bed offers received.  Patient chooses bed at [facility]     Physician recommends and patient chooses bed at      Patient to be transferred to [facility] on 09/10/18.  Patient to be transferred to facility by aph staff     Patient family notified on 09/10/18 of transfer.  Name of family member notified:  son listed on chart     PHYSICIAN       Additional Comment:  Discharge clinicals sent. LCSW  signing off.   _______________________________________________ Ihor Gully, LCSW 09/10/2018, 1:31 PM

## 2018-09-10 NOTE — Progress Notes (Signed)
Pharmacy Antibiotic Note  [name] [name] is a 84 y.o. male admitted on 09/07/2018 with UTI.  Pharmacy has been consulted for Cefepime dosing.  Plan: Cefepime 1gm IV every 24 hours. Monitor labs, micro and vitals.   Height: 5\' 8"  (172.7 cm) Weight: 169 lb 15.6 oz (77.1 kg) IBW/kg (Calculated) : 68.4  Temp (24hrs), Avg:98.9 F (37.2 C), Min:98.1 F (36.7 C), Max:99.4 F (37.4 C)  Recent Labs  Lab 09/04/18 0700 09/07/18 1228 09/07/18 1255 09/08/18 0803 09/09/18 0529  WBC  --  11.1*  --  9.2  --   CREATININE 2.93* 3.39*  --  2.90* 2.52*  LATICACIDVEN  --   --  0.84  --   --     Estimated Creatinine Clearance: 22.2 mL/min (A) (by C-G formula based on SCr of 2.52 mg/dL (H)).    Allergies  Allergen Reactions  . Ivp Dye [Iodinated Diagnostic Agents]     Due to partial renal failure  . Nalbuphine Other (See Comments)    PATIENT STATES IT MADE HIM FEEL LIKE HE WAS GOING TO DIE. NO SPECIFICS  . Codeine Anxiety    Antimicrobials this admission: Cefepime 10/7  >>        Dose adjustments this admission: n/a   Microbiology results: 10/7 UCx:  Pseudomonas: sensitive to cefepime   Thank you for allowing pharmacy to be a part of this patient's care.  Ramond Craver 09/10/2018 8:43 AM

## 2018-09-10 NOTE — Care Management (Signed)
Patient discharging to SNF today. CSW making arrangements, no CM needs.

## 2018-09-10 NOTE — Progress Notes (Signed)
Pt IV is actually still in place even though it is charted that it is removed. Pt is going to the [facility] on IV antibiotics. Confirmed that order with Dr. Luan Pulling.

## 2018-09-10 NOTE — Progress Notes (Signed)
Physical Therapy Treatment Patient Details Name: [name] [name] MRN: [id number] DOB: 06/23/1934 Today's Date: 09/10/2018    History of Present Illness 84yo male c/o syncope, lightheadedness. PMH prostate CA, HTN, nephrostomy     PT Comments    Pt was seen for continued PT but declined to work on gait.  He is able to stretch and move but is not flexibile in ankles and is weak in hips as noted with performance.  Follow acutely for further work on gait and balance as needed.  Pt is expected to DC to SNF soon.   Follow Up Recommendations  SNF     Equipment Recommendations  Rolling walker with 5" wheels;3in1 (PT)    Recommendations for Other Services       Precautions / Restrictions Precautions Precautions: Fall;Other (comment) Precaution Comments: B nephrostomy bags/tubes with leg straps  Restrictions Weight Bearing Restrictions: No    Mobility  Bed Mobility Overal bed mobility: Needs Assistance Bed Mobility: (scooting up the bed)           General bed mobility comments: declined OOB  Transfers                 General transfer comment: declined OOB  Ambulation/Gait                 Stairs             Wheelchair Mobility    Modified Rankin (Stroke Patients Only)       Balance                                            Cognition Arousal/Alertness: Awake/alert Behavior During Therapy: Flat affect Overall Cognitive Status: Within Functional Limits for tasks assessed                                        Exercises General Exercises - Lower Extremity Ankle Circles/Pumps: AAROM;Both;5 reps Quad Sets: AROM;Both;10 reps Gluteal Sets: AROM;Both;10 reps Heel Slides: AAROM;Both;10 reps Hip ABduction/ADduction: AROM;AAROM;Both;10 reps Straight Leg Raises: AAROM;Both;10 reps    General Comments        Pertinent Vitals/Pain Pain Assessment: No/denies pain    Home Living                       Prior Function            PT Goals (current goals can now be found in the care plan section) Acute Rehab PT Goals Patient Stated Goal: get home Progress towards PT goals: Progressing toward goals    Frequency    Min 3X/week      PT Plan Current plan remains appropriate    Co-evaluation              AM-PAC PT "6 Clicks" Daily Activity  Outcome Measure  Difficulty turning over in bed (including adjusting bedclothes, sheets and blankets)?: A Little Difficulty moving from lying on back to sitting on the side of the bed? : A Little Difficulty sitting down on and standing up from a chair with arms (e.g., wheelchair, bedside commode, etc,.)?: A Little Help needed moving to and from a bed to chair (including a wheelchair)?: A Little Help needed walking in hospital room?: A Little Help needed climbing 3-5 steps with a  railing? : A Lot 6 Click Score: 17    End of Session   Activity Tolerance: Patient tolerated treatment well Patient left: in bed;with call bell/phone within reach;with bed alarm set Nurse Communication: Mobility status PT Visit Diagnosis: Unsteadiness on feet (R26.81);Muscle weakness (generalized) (M62.81);History of falling (Z91.81);Dizziness and giddiness (R42)     Time: 4619-0122 PT Time Calculation (min) (ACUTE ONLY): 22 min  Charges:  $Therapeutic Exercise: 8-22 mins                     Ramond Dial 09/10/2018, 8:54 PM   8:56 PM, 09/10/18 Mee Hives, PT, MS Physical Therapist - [place] [phone] (Office)

## 2018-09-10 NOTE — Progress Notes (Signed)
Subjective: He says he feels better.  He has agreed to short-term nursing home placement which I think is appropriate  Objective: Vital signs in last 24 hours: Temp:  [98.1 F (36.7 C)-99.4 F (37.4 C)] 98.1 F (36.7 C) (10/10 0559) Pulse Rate:  [70-97] 70 (10/10 0559) Resp:  [16-22] 22 (10/10 0559) BP: (136-148)/(60-70) 136/60 (10/10 0559) SpO2:  [97 %-99 %] 98 % (10/10 0559) Weight:  [77.1 kg] 77.1 kg (10/10 0500) Weight change: -1.8 kg Last BM Date: 09/09/18  Intake/Output from previous day: 10/09 0701 - 10/10 0700 In: 480 [P.O.:480] Out: 550 [Urine:550]  PHYSICAL EXAM General appearance: alert, cooperative and no distress Resp: clear to auscultation bilaterally Cardio: regular rate and rhythm, S1, S2 normal, no murmur, click, rub or gallop GI: soft, non-tender; bowel sounds normal; no masses,  no organomegaly Extremities: extremities normal, atraumatic, no cyanosis or edema  Lab Results:  Results for orders placed or performed during the hospital encounter of 09/07/18 (from the past 48 hour(s))  Basic metabolic panel     Status: Abnormal   Collection Time: 09/09/18  5:29 AM  Result Value Ref Range   Sodium 138 135 - 145 mmol/L   Potassium 4.4 3.5 - 5.1 mmol/L   Chloride 112 (H) 98 - 111 mmol/L   CO2 19 (L) 22 - 32 mmol/L   Glucose, Bld 93 70 - 99 mg/dL   BUN 42 (H) 8 - 23 mg/dL   Creatinine, Ser 2.52 (H) 0.61 - 1.24 mg/dL   Calcium 8.2 (L) 8.9 - 10.3 mg/dL   GFR calc non Af Amer 22 (L) >60 mL/min   GFR calc Af Amer 26 (L) >60 mL/min    Comment: (NOTE) The eGFR has been calculated using the CKD EPI equation. This calculation has not been validated in all clinical situations. eGFR's persistently <60 mL/min signify possible Chronic Kidney Disease.    Anion gap 7 5 - 15    Comment: Performed at [facility], [address] [place], [place] [address]  Glucose, capillary     Status: None   Collection Time: 09/09/18 10:34 PM  Result Value Ref Range    Glucose-Capillary 97 70 - 99 mg/dL    ABGS No results for input(s): PHART, PO2ART, TCO2, HCO3 in the last 72 hours.  Invalid input(s): PCO2 CULTURES Recent Results (from the past 240 hour(s))  Urine Culture     Status: Abnormal   Collection Time: 09/07/18  2:04 PM  Result Value Ref Range Status   Specimen Description   Final    URINE, CLEAN CATCH Performed at [facility], [address] [place], [place] [address]    Special Requests   Final    NONE Performed at [facility], [address] [place], [place] [address]    Culture >=100,000 COLONIES/mL PSEUDOMONAS AERUGINOSA (A)  Final   Report Status 09/09/2018 FINAL  Final   Organism ID, Bacteria PSEUDOMONAS AERUGINOSA (A)  Final      Susceptibility   Pseudomonas aeruginosa - MIC*    CEFTAZIDIME 8 SENSITIVE Sensitive     CIPROFLOXACIN >=4 RESISTANT Resistant     GENTAMICIN 4 SENSITIVE Sensitive     IMIPENEM >=16 RESISTANT Resistant     CEFEPIME 8 SENSITIVE Sensitive     * >=100,000 COLONIES/mL PSEUDOMONAS AERUGINOSA   Studies/Results: US Carotid Bilateral  Result Date: 09/08/2018 CLINICAL DATA:  Syncopal episode. Altered mental status. History of CAD and hyperlipidemia. Former smoker. EXAM: BILATERAL CAROTID DUPLEX ULTRASOUND TECHNIQUE: Pearline Cables scale imaging, color Doppler and duplex  ultrasound were performed of bilateral carotid and vertebral arteries in the neck. COMPARISON:  None. FINDINGS: Criteria: Quantification of carotid stenosis is based on velocity parameters that correlate the residual internal carotid diameter with NASCET-based stenosis levels, using the diameter of the distal internal carotid lumen as the denominator for stenosis measurement. The following velocity measurements were obtained: RIGHT ICA:  168/35 cm/sec CCA:  2/02 cm/sec SYSTOLIC ICA/CCA RATIO:  1.5 ECA:  132 cm/sec LEFT ICA:  117/27 cm/sec CCA:  54/27 cm/sec SYSTOLIC ICA/CCA RATIO:  1.2 ECA:  152 cm/sec RIGHT CAROTID ARTERY: There is a minimal to moderate  amount of echogenic plaque within the right carotid bulb (images 15 and 17), extending to involve the origin and proximal aspects of the right internal carotid artery (image 25), resulting in elevated peak systolic velocities within the proximal mid aspects the right internal carotid artery. Greatest acquired peak systolic velocity within mid aspect the right internal carotid artery measures 168 centimeters/second (image 31). RIGHT VERTEBRAL ARTERY:  Antegrade Flow LEFT CAROTID ARTERY: There is a moderate amount of echogenic plaque within the left carotid bulb (images 50 and 52). There is a minimal amount of echogenic plaque involving the origin and proximal aspect the left internal carotid artery (image 60), not resulting in elevated peak systolic velocities within the interrogated course the left internal carotid artery to suggest a hemodynamically significant stenosis. LEFT VERTEBRAL ARTERY:  Antegrade Flow IMPRESSION: 1. Minimal to moderate amount of right-sided atherosclerotic plaque results in elevated peak systolic velocities within the right internal carotid artery compatible with the 50-69% luminal narrowing range. Further evaluation with CTA could performed as clinically indicated. 2. Minimal to moderate amount of left-sided atherosclerotic plaque, not definitely resulting in a hemodynamically significant stenosis. Electronically Signed   By: Sandi Mariscal M.D.   On: 09/08/2018 12:57    Medications:  Prior to Admission:  Medications Prior to Admission  Medication Sig Dispense Refill Last Dose  . acetaminophen (TYLENOL) 500 MG tablet Take 1,000 mg by mouth every 8 (eight) hours as needed for mild pain or moderate pain. For headache pain     at [facility]  . ALFALFA PO Take 250 mg by mouth daily.    09/06/2018 at Unknown time  . amLODipine (NORVASC) 5 MG tablet Take 5 mg by mouth daily.   09/06/2018 at Unknown time  . doxycycline (VIBRAMYCIN) 100 MG capsule Take 100 mg by mouth 2 (two) times daily.     Past Week at Unknown time  . fish oil-omega-3 fatty acids 1000 MG capsule Take 1 g by mouth 2 (two) times daily.    09/06/2018 at Unknown time  . Multiple Vitamin (MULTIVITAMIN WITH MINERALS) TABS tablet Take 1 tablet by mouth daily.   09/06/2018 at Unknown time  . rosuvastatin (CRESTOR) 10 MG tablet Take 10 mg by mouth daily.    09/06/2018 at Unknown time  . sertraline (ZOLOFT) 100 MG tablet Take 100 mg by mouth daily.   09/06/2018 at Unknown time  . Influenza Vac Split Quad (FLUARIX QUADRIVALENT IM) Inject 0.5 mLs into the muscle once.   Completed Course at Unknown time   Scheduled: . amLODipine  5 mg Oral Daily  . feeding supplement (PRO-STAT SUGAR FREE 64)  30 mL Oral BID  . heparin  5,000 Units Subcutaneous Q8H  . rosuvastatin  10 mg Oral q1800  . sertraline  100 mg Oral Daily   Continuous: . ceFEPime (MAXIPIME) IV Stopped (09/09/18 2215)   CWC:[id number] **OR** acetaminophen  Assesment: He was admitted  with syncope.  Syncope work-up is negative.  He had only been discharged from skilled care facility 24 hours when he fell at home.  He has acute on chronic renal failure and he is back to baseline he was probably dehydrated on admission  He has Pseudomonas urinary tract infection that will require IV treatment  He has hypertension that is well controlled  He has severe depression on medication Principal Problem:   Syncope Active Problems:   Essential hypertension   Paroxysmal A-fib (HCC)   Anemia   Acute lower UTI   ARF (acute renal failure) ([place])    Plan: Plan is for him to go to skilled care facility for rehab    LOS: 2 days   Aftyn Nott L 09/10/2018, 8:43 AM

## 2018-09-10 NOTE — Progress Notes (Signed)
Report called and given to nursing staff at [facility]. Pt to be transported via nursing staff.

## 2018-09-10 NOTE — Discharge Summary (Signed)
Physician Discharge Summary  Patient ID: [name] [name] MRN: [id number] DOB/AGE: Apr 05, 1934 84 y.o. Primary Care Physician:Ricki Vanhandel, Percell Miller, MD Admit date: 09/07/2018 Discharge date: 09/10/2018    Discharge Diagnoses:   Principal Problem:   Syncope Active Problems:   Essential hypertension   Paroxysmal A-fib (HCC)   Anemia   Acute lower UTI   ARF (acute renal failure) (HCC) Dehydration Depression  Allergies as of 09/10/2018      Reactions   Ivp Dye [iodinated Diagnostic Agents]    Due to partial renal failure   Nalbuphine Other (See Comments)   PATIENT STATES IT MADE HIM FEEL LIKE HE WAS GOING TO DIE. NO SPECIFICS   Codeine Anxiety      Medication List    STOP taking these medications   doxycycline 100 MG capsule Commonly known as:  VIBRAMYCIN     TAKE these medications   acetaminophen 500 MG tablet Commonly known as:  TYLENOL Take 1,000 mg by mouth every 8 (eight) hours as needed for mild pain or moderate pain. For headache pain   ALFALFA PO Take 250 mg by mouth daily.   amLODipine 5 MG tablet Commonly known as:  NORVASC Take 5 mg by mouth daily.   ceFEPIme 1 g in sodium chloride 0.9 % 100 mL Inject 1 g into the vein daily.   fish oil-omega-3 fatty acids 1000 MG capsule Take 1 g by mouth 2 (two) times daily.   FLUARIX QUADRIVALENT IM Inject 0.5 mLs into the muscle once.   multivitamin with minerals Tabs tablet Take 1 tablet by mouth daily.   rosuvastatin 10 MG tablet Commonly known as:  CRESTOR Take 10 mg by mouth daily.   sertraline 100 MG tablet Commonly known as:  ZOLOFT Take 100 mg by mouth daily.       Discharged Condition: Improved    Consults: None  Significant Diagnostic Studies: Dg Elbow Complete Right (3+view)  Result Date: 09/07/2018 CLINICAL DATA:  FALL TODAY.  RIGHT ELBOW PAIN. EXAM: RIGHT ELBOW - COMPLETE 3+ VIEW COMPARISON:  None. FINDINGS: Soft tissue abrasion is noted posterior to the olecranon. There is no underlying  fracture or foreign body. There is no joint effusion. The joint is located. IMPRESSION: 1. Superficial soft tissue abrasion without underlying fracture. Electronically Signed   By: San Morelle M.D.   On: 09/07/2018 12:46   Ct Head Wo Contrast  Result Date: 09/07/2018 CLINICAL DATA:  84 year old male found sitting on floor this morning. Felt dizzy and fell. Initial encounter. EXAM: CT HEAD WITHOUT CONTRAST TECHNIQUE: Contiguous axial images were obtained from the base of the skull through the vertex without intravenous contrast. COMPARISON:  09/07/2018 neck CT.  08/20/2011 head CT. FINDINGS: Brain: No intracranial hemorrhage or CT evidence of large acute infarct. Moderate chronic microvascular changes. Moderate global atrophy. No intracranial mass lesion noted on this unenhanced exam. Vascular: No hyperdense vessel.  Vascular calcifications. Skull: No skull fracture Sinuses/Orbits: No acute orbital abnormality. Minimal partial opacification inferior aspect right maxillary sinus. Other: Mastoid air cells and middle ear cavities are clear. IMPRESSION: 1. No skull fracture or intracranial hemorrhage. 2. No CT evidence of large acute infarct. 3. Chronic microvascular changes. 4. Global atrophy. Electronically Signed   By: Genia Del M.D.   On: 09/07/2018 18:30   Ct Soft Tissue Neck Wo Contrast  Result Date: 09/07/2018 CLINICAL DATA:  Neck mass.  Solitary, afebrile. EXAM: CT NECK WITHOUT CONTRAST TECHNIQUE: Multidetector CT imaging of the neck was performed following the standard protocol without intravenous contrast.  COMPARISON:  None. FINDINGS: Pharynx and larynx: Normal. No mass or swelling. Salivary glands: Atrophic right parotid gland. Left parotid normal. Submandibular glands mildly atrophic bilaterally. Negative for mass or calculus. Thyroid: Negative Lymph nodes: Negative Vascular: Atherosclerotic disease. Limited evaluation of the vessels without intravenous contrast. Limited intracranial:  Negative Visualized orbits: Not imaged Mastoids and visualized paranasal sinuses: Mild mucosal edema right maxillary sinus. Otherwise clear. Mastoid clear bilaterally. Skeleton: Cervical spondylosis C5-6 and C6-7. No acute skeletal abnormality. Upper chest: Lung apices clear bilaterally. Other: None IMPRESSION: Negative for mass or adenopathy in the neck. No acute abnormality identified Atherosclerotic disease. Atrophic right parotid gland and bilateral submandibular glands without acute abnormality. Electronically Signed   By: Franchot Gallo M.D.   On: 09/07/2018 13:25   US Renal  Result Date: 09/07/2018 CLINICAL DATA:  Acute renal failure EXAM: RENAL / URINARY TRACT ULTRASOUND COMPLETE COMPARISON:  CT abdomen and pelvis 07/19/2018 FINDINGS: Right Kidney: Length: 9.0 cm. Normal cortical thickness. Upper normal cortical echogenicity. No mass or shadowing calcification. Mild collecting system dilatation, similar to prior CT. Left Kidney: Length: 9.5 cm. Cortical thinning. Normal cortical echogenicity. No mass, hydronephrosis or shadowing calcification. Mild LEFT renal collecting system dilatation seen on the prior CT exam not definitely visualized on current ultrasound. Bladder: Probable dependent debris.  No focal mass. IMPRESSION: Mild collecting system dilatation of the RIGHT kidney similar to that seen on prior CT exam. Probable dependent debris within urinary bladder. Electronically Signed   By: Lavonia Dana M.D.   On: 09/07/2018 16:46   US Carotid Bilateral  Result Date: 09/08/2018 CLINICAL DATA:  Syncopal episode. Altered mental status. History of CAD and hyperlipidemia. Former smoker. EXAM: BILATERAL CAROTID DUPLEX ULTRASOUND TECHNIQUE: Pearline Cables scale imaging, color Doppler and duplex ultrasound were performed of bilateral carotid and vertebral arteries in the neck. COMPARISON:  None. FINDINGS: Criteria: Quantification of carotid stenosis is based on velocity parameters that correlate the residual internal  carotid diameter with NASCET-based stenosis levels, using the diameter of the distal internal carotid lumen as the denominator for stenosis measurement. The following velocity measurements were obtained: RIGHT ICA:  168/35 cm/sec CCA:  3/29 cm/sec SYSTOLIC ICA/CCA RATIO:  1.5 ECA:  132 cm/sec LEFT ICA:  117/27 cm/sec CCA:  92/42 cm/sec SYSTOLIC ICA/CCA RATIO:  1.2 ECA:  152 cm/sec RIGHT CAROTID ARTERY: There is a minimal to moderate amount of echogenic plaque within the right carotid bulb (images 15 and 17), extending to involve the origin and proximal aspects of the right internal carotid artery (image 25), resulting in elevated peak systolic velocities within the proximal mid aspects the right internal carotid artery. Greatest acquired peak systolic velocity within mid aspect the right internal carotid artery measures 168 centimeters/second (image 31). RIGHT VERTEBRAL ARTERY:  Antegrade Flow LEFT CAROTID ARTERY: There is a moderate amount of echogenic plaque within the left carotid bulb (images 50 and 52). There is a minimal amount of echogenic plaque involving the origin and proximal aspect the left internal carotid artery (image 60), not resulting in elevated peak systolic velocities within the interrogated course the left internal carotid artery to suggest a hemodynamically significant stenosis. LEFT VERTEBRAL ARTERY:  Antegrade Flow IMPRESSION: 1. Minimal to moderate amount of right-sided atherosclerotic plaque results in elevated peak systolic velocities within the right internal carotid artery compatible with the 50-69% luminal narrowing range. Further evaluation with CTA could performed as clinically indicated. 2. Minimal to moderate amount of left-sided atherosclerotic plaque, not definitely resulting in a hemodynamically significant stenosis. Electronically Signed  By: Sandi Mariscal M.D.   On: 09/08/2018 12:57   Dg Chest Port 1 View  Result Date: 09/07/2018 CLINICAL DATA:  Weakness, fall EXAM: PORTABLE  CHEST 1 VIEW COMPARISON:  06/11/2017, 04/29/2018 FINDINGS: There is right basilar scarring with tenting of the diaphragm. There is mild lingular scarring. There is no focal consolidation. There is no pleural effusion or pneumothorax. The heart and mediastinal contours are unremarkable. The osseous structures are unremarkable. IMPRESSION: No acute cardiopulmonary disease. Electronically Signed   By: Kathreen Devoid   On: 09/07/2018 12:50    Lab Results: Basic Metabolic Panel: Recent Labs    09/08/18 0803 09/09/18 0529  NA 140 138  K 4.5 4.4  CL 113* 112*  CO2 19* 19*  GLUCOSE 92 93  BUN 48* 42*  CREATININE 2.90* 2.52*  CALCIUM 8.4* 8.2*   Liver Function Tests: Recent Labs    09/07/18 1228 09/08/18 0803  AST 26 27  ALT 28 23  ALKPHOS 69 76  BILITOT 0.5 0.3  PROT 6.7 6.7  ALBUMIN 2.8* 2.7*     CBC: Recent Labs    09/07/18 1228 09/08/18 0803  WBC 11.1* 9.2  NEUTROABS 9.7*  --   HGB 7.9* 8.2*  HCT 24.4* 26.0*  MCV 88.4 90.3  PLT 208 211    Recent Results (from the past 240 hour(s))  Urine Culture     Status: Abnormal   Collection Time: 09/07/18  2:04 PM  Result Value Ref Range Status   Specimen Description   Final    URINE, CLEAN CATCH Performed at [facility], [address], [place], [place] [address]    Special Requests   Final    NONE Performed at [facility], [address], [place], [place] [address]    Culture >=100,000 COLONIES/mL PSEUDOMONAS AERUGINOSA (A)  Final   Report Status 09/09/2018 FINAL  Final   Organism ID, Bacteria PSEUDOMONAS AERUGINOSA (A)  Final      Susceptibility   Pseudomonas aeruginosa - MIC*    CEFTAZIDIME 8 SENSITIVE Sensitive     CIPROFLOXACIN >=4 RESISTANT Resistant     GENTAMICIN 4 SENSITIVE Sensitive     IMIPENEM >=16 RESISTANT Resistant     CEFEPIME 8 SENSITIVE Sensitive     * >=100,000 COLONIES/mL PSEUDOMONAS AERUGINOSA     Hospital Course: This is an 84 year old who has had significant issues with his urinary tract over  the last 2 or 3 months.  He has had multiple bouts of gross hematuria has developed bouts of acute renal failure and eventually was hospitalized on multiple occasions discharged to a skilled care facility.  He did rehab and went home but about 24 hours after discharge he fell.  He came to the emergency department where he was noted to have acute on chronic renal failure probably dehydration concern about urinary tract infection.  He was started on IV fluids because he was dehydrated.  He had syncope work-up which was negative.  He grew Pseudomonas in his urine sensitive to cefepime and he will be discharged to skilled care facility for 7 more days of cefepime.  Discussed with pharmacy and they feel that a regular IV will be okay for this antibiotic  Discharge Exam: Blood pressure 136/60, pulse 70, temperature 98.1 F (36.7 C), temperature source Oral, resp. rate (!) 22, height 5\' 8"  (1.727 m), weight 77.1 kg, SpO2 98 %. He is awake and alert.  He has a laceration on his left cheek with some bruising around it.  His chest  is clear.  His heart is regular.  Disposition: To skilled care facility for rehabilitation      Signed: Robt Okuda L   09/10/2018, 8:49 AM

## 2018-09-11 ENCOUNTER — Non-Acute Institutional Stay (SKILLED_NURSING_FACILITY): Payer: MEDICARE | Admitting: Internal Medicine

## 2018-09-11 ENCOUNTER — Encounter: Payer: Self-pay | Admitting: Internal Medicine

## 2018-09-11 DIAGNOSIS — I10 Essential (primary) hypertension: Secondary | ICD-10-CM

## 2018-09-11 DIAGNOSIS — N390 Urinary tract infection, site not specified: Secondary | ICD-10-CM | POA: Diagnosis not present

## 2018-09-11 DIAGNOSIS — I480 Paroxysmal atrial fibrillation: Secondary | ICD-10-CM | POA: Diagnosis not present

## 2018-09-11 DIAGNOSIS — N184 Chronic kidney disease, stage 4 (severe): Secondary | ICD-10-CM

## 2018-09-11 DIAGNOSIS — D631 Anemia in chronic kidney disease: Secondary | ICD-10-CM | POA: Diagnosis not present

## 2018-09-11 DIAGNOSIS — A419 Sepsis, unspecified organism: Secondary | ICD-10-CM | POA: Diagnosis not present

## 2018-09-11 NOTE — Progress Notes (Signed)
Location:    [facility] Room Number: 130/P Place of Service:  SNF (31) Provider:  Murray Hodgkins, MD  Patient Care Team: [name], MD as PCP - General (Internal Medicine) [name], MD as Attending Physician (Cardiology)  Extended Emergency Contact Information Primary Emergency Contact: Mayra Neer of [place] Phone: [phone] Relation: Son  Code Status:  DNR Goals of care: Advanced Directive information Advanced Directives 09/11/2018  Does Patient Have a Medical Advance Directive? Yes  Type of Advance Directive Out of facility DNR (pink MOST or yellow form)  Does patient want to make changes to medical advance directive? No - Patient declined  Would patient like information on creating a medical advance directive? No - Patient declined  Pre-existing out of facility DNR order (yellow form or pink MOST form) -     Chief Complaint  Patient presents with  . Hospitalization Follow-up    Hospitalization f/u visit    HPI:  Pt is a 84 y.o. male seen today for a hospital followup of a hospitalization for UTI complicated with a history of acute renal issues.  Patient has a very complicated history in regards to his urinary tract- he has had gross hematuria has developed bouts of acute renal failure and required multiple hospitalizations including hospitalizations at [facility].  He has a history of GERD hyperlipidemia hypertension chronic kidney disease stage IV and prostate cancer status post radical prostatectomy with radiation and artificial urinary sphincter placement back in 2012.   He was initially hospitalized for E. coli bacteremia in early August he also had bladder outlet obstruction with blood clots with hemorrhage I cystitis.  He also had A. fib with rapid ventricular rate.  He went to skilled nursing but again was admitted because of  hematuria and worsening renal function.  He had bilateral nephrostomy tubes placed which he remains on.  He also has anemia thought due to hematuria and has required transfusion in the past.  In regards A. fib was not started on anticoagulation because of hematuria beta-blocker was attempted but was discontinued because of bradycardia  Eventually was discharged home but eventually came back to the ER after apparently falling- in the ER he was noted to have acute on chronic renal failure and suspected a urinary tract infection he was started on fluids- syncope work-up was negative he did grow Pseudomonas in his urine this was sensitive to cefepime and he is completing a course this-  Currently he says he feels okay but still weak.  Vital signs are stable he will need nephrology and urology work-up.    Past Medical History:  Diagnosis Date  . 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   . Cancer ([place])    prostate ca 1999/removal/ rad tx  . Carotid artery occlusion   . Hyperlipidemia   . Hypertension   . Incontinence   . Reflux   . Renal disorder   . Status post implantation of artificial urinary sphincter    Past Surgical History:  Procedure Laterality Date  . cataracts    . HERNIA REPAIR    . NEPHROSTOMY    . radical prostectomy    . URINARY SPHINCTER IMPLANT      Allergies  Allergen Reactions  . Ivp Dye [Iodinated Diagnostic Agents]  Due to partial renal failure  . Nalbuphine Other (See Comments)    PATIENT STATES IT MADE HIM FEEL LIKE HE WAS GOING TO DIE. NO SPECIFICS  . Codeine Anxiety    Outpatient Encounter Medications as of 09/11/2018  Medication Sig  . acetaminophen (TYLENOL) 500 MG tablet Take 1,000 mg by mouth every 8 (eight) hours as needed for mild pain or moderate pain. For headache pain   . ALFALFA PO Take 250 mg by mouth daily.   Marland Kitchen amLODipine (NORVASC) 5 MG tablet Take 5 mg by mouth  daily.  Marland Kitchen ceFEPIme 1 g in sodium chloride 0.9 % 100 mL Inject 1 g into the vein daily.  . fish oil-omega-3 fatty acids 1000 MG capsule Take 1 g by mouth 2 (two) times daily.   . Influenza Vac Split Quad (FLUARIX QUADRIVALENT IM) Inject 0.5 mLs into the muscle once.  . Multiple Vitamin (MULTIVITAMIN WITH MINERALS) TABS tablet Take 1 tablet by mouth daily.  . rosuvastatin (CRESTOR) 10 MG tablet Take 10 mg by mouth daily.   . sertraline (ZOLOFT) 100 MG tablet Take 100 mg by mouth daily.  . sodium chloride 0.9 % injection Inject 3 mLs into the vein 2 (two) times daily.  . sodium chloride 0.9 % injection Inject 3 mLs into the vein daily.   No facility-administered encounter medications on file as of 09/11/2018.      Review of Systems   General is not complaining of any fever or chills.  Skin does not complain of rashes or itching.  Head ears eyes nose mouth and throat is not complaining of sore throat or visual changes.  Respiratory does not complain of being short of breath or having a significant cough.  Cardiac does not complain of chest pain appears that has  relatively baseline lower extremity edema  GI does not complain of abdominal pain vomiting or diarrhea said earlier he had some nausea but this appears to have resolved.  GU has a significant urinary issues but does not complain of dysuria at this time he does have nephrostomy tubes says he still has hematuria at times.  Musculoskeletal is not complaining of joint pain at this time.  Neurologic does not complain of dizziness syncopal episodes or numbness does have weakness.  And psych does not complain of overt depression or anxiety continues to have somewhat of a flat affect he is on Zoloft  Immunization History  Administered Date(s) Administered  . Influenza-Unspecified 09/01/2013   Pertinent  Health Maintenance Due  Topic Date Due  . INFLUENZA VACCINE  09/30/2018 (Originally 07/02/2018)  . PNA vac Low Risk Adult (1 of  2 - PCV13) 09/30/2018 (Originally 09/30/2001)   Fall Risk  07/29/2016  Falls in the past year? No  Comment Emmi Telephone Survey: data to providers prior to load   Functional Status Survey:    Vitals:   09/11/18 1229  BP: (!) 152/74  Pulse: 77  Resp: 20  Temp: 98.3 F (36.8 C)  TempSrc: Oral  Manual blood pressure was 138/64  Physical Exam   General this is a pleasant elderly male in no distress resting comfortably in bed.  His skin is warm and dry.--Has a small abrasion under his left eye that appears to be healing  Eyes visual acuity appears to be intact sclera and conjunctive are clear.  Oropharynx is clear mucous membranes moist.   Chest is clear to auscultation there is no labored breathing.  Heart is regular rate and rhythm with distant heart sounds  he has mild lower extremity edema appears baseline.  Abdomen is soft nontender with positive bowel sounds.  Musculoskeletal is able to move all extremities x4 with some lower extremity weakness limited exam since he is in bed.  Neurologic is grossly intact he is alert could not really appreciate lateralizing findings.  His speech is clear  Psych--he is pleasant and appropriate continues to speak somewhat slowly- appears to be fairly accurate when he gives a history.    Labs reviewed: Recent Labs    09/04/18 0700 09/07/18 1228 09/08/18 0803 09/09/18 0529  NA 140 140 140 138  K 4.2 4.4 4.5 4.4  CL 111 112* 113* 112*  CO2 21* 18* 19* 19*  GLUCOSE 96 110* 92 93  BUN 43* 53* 48* 42*  CREATININE 2.93* 3.39* 2.90* 2.52*  CALCIUM 8.8*  8.8 8.6* 8.4* 8.2*  PHOS 3.8  --   --   --    Recent Labs    07/21/18 1425 09/04/18 0700 09/07/18 1228 09/08/18 0803  AST 30  --  26 27  ALT 27  --  28 23  ALKPHOS 82  --  69 76  BILITOT 0.5  --  0.5 0.3  PROT 6.0*  --  6.7 6.7  ALBUMIN 2.9* 2.7* 2.8* 2.7*   Recent Labs    08/15/18 0830  08/30/18 1130 09/04/18 0700 09/07/18 1228 09/08/18 0803  WBC 9.5   < >  12.8*  --  11.1* 9.2  NEUTROABS 6.2  --  10.7*  --  9.7*  --   HGB 9.8*   < > 9.3* 8.3* 7.9* 8.2*  HCT 30.5*   < > 29.5* 26.0* 24.4* 26.0*  MCV 90.2   < > 91.0  --  88.4 90.3  PLT 316   < > 272  --  208 211   < > = values in this interval not displayed.   No results found for: TSH No results found for: HGBA1C Lab Results  Component Value Date   CHOL 146 07/26/2008   HDL 30.3 (L) 07/26/2008   LDLCALC 82 07/26/2008   TRIG 171 (H) 07/26/2008   CHOLHDL 4.8 CALC 07/26/2008    Significant Diagnostic Results in last 30 days:  No results found.  Assessment/Plan  #1- history of UTI complicated with renal issues as noted above- he is completing a course of cefepime -he will complete this on October 17- mental status appears relatively baseline.  2.-  History of renal issues as noted above status post nephrostomy tubes- complicated with acute on chronic renal disease-creatinine of 2.52on hospital discharge appears relatively baseline--will update this on Monday, October 14.  He will need nephrology as well as urology follow-up.  I noted at one time he had been on sodium bicarbonate he is no longer on this again will await updated labs  3.  Hypertension at this point will monitor he is on Norvasc-manual blood pressure was 138/64.  4.  History of anemia with previous history as a transfusion and etiology I suspect of chronic disease with his renal issues- hemoglobin was stable at 8.2-will have this updated as well.--He has had previous hospitalizations that included iron studies B12 folate and thought not to need supplementation.  4.  History of atrial fibrillation not on anticoagulation secondary to history hematuria not a candidate for beta-blocker because of bradycardia appears to be stable however.  5 history of hyperlipidemia he continues on a statin-  He will need continued PT and OT as well as expedient urology  and nephrology follow-up- will update labs first laboratory day next  week-  CPT- 99310-of note greater than 35 minutes spent assessing patient-reviewing his chart and labs- coordinating and formulating a plan of care- greater than 50% time spent coordinating a plan of care

## 2018-09-13 ENCOUNTER — Encounter: Payer: Self-pay | Admitting: Internal Medicine

## 2018-09-14 ENCOUNTER — Non-Acute Institutional Stay (SKILLED_NURSING_FACILITY): Payer: MEDICARE | Admitting: Internal Medicine

## 2018-09-14 ENCOUNTER — Encounter (HOSPITAL_COMMUNITY)
Admission: RE | Admit: 2018-09-14 | Discharge: 2018-09-14 | Disposition: A | Discharge status: Home / Self Care | Payer: MEDICARE | Source: Skilled Nursing Facility

## 2018-09-14 ENCOUNTER — Encounter: Payer: Self-pay | Admitting: Internal Medicine

## 2018-09-14 DIAGNOSIS — R55 Syncope and collapse: Secondary | ICD-10-CM

## 2018-09-14 DIAGNOSIS — I480 Paroxysmal atrial fibrillation: Secondary | ICD-10-CM

## 2018-09-14 DIAGNOSIS — N390 Urinary tract infection, site not specified: Secondary | ICD-10-CM | POA: Diagnosis not present

## 2018-09-14 DIAGNOSIS — N184 Chronic kidney disease, stage 4 (severe): Secondary | ICD-10-CM

## 2018-09-14 LAB — CBC WITH DIFFERENTIAL/PLATELET
Abs Immature Granulocytes: 0.03 10*3/uL (ref 0.00–0.07)
Basophils Absolute: 0 10*3/uL (ref 0.0–0.1)
Basophils Relative: 0 %
Eosinophils Absolute: 0.3 10*3/uL (ref 0.0–0.5)
Eosinophils Relative: 3 %
HCT: 23.3 % — ABNORMAL LOW (ref 39.0–52.0)
Hemoglobin: 7.2 g/dL — ABNORMAL LOW (ref 13.0–17.0)
Immature Granulocytes: 0 %
Lymphocytes Relative: 17 %
Lymphs Abs: 1.5 10*3/uL (ref 0.7–4.0)
MCH: 28.2 pg (ref 26.0–34.0)
MCHC: 30.9 g/dL (ref 30.0–36.0)
MCV: 91.4 fL (ref 80.0–100.0)
Monocytes Absolute: 0.6 10*3/uL (ref 0.1–1.0)
Monocytes Relative: 7 %
Neutro Abs: 6.2 10*3/uL (ref 1.7–7.7)
Neutrophils Relative %: 73 %
Platelets: 183 10*3/uL (ref 150–400)
RBC: 2.55 MIL/uL — ABNORMAL LOW (ref 4.22–5.81)
RDW: 16.1 % — ABNORMAL HIGH (ref 11.5–15.5)
WBC: 8.7 10*3/uL (ref 4.0–10.5)
nRBC: 0 % (ref 0.0–0.2)

## 2018-09-14 LAB — BASIC METABOLIC PANEL
Anion gap: 6 (ref 5–15)
BUN: 42 mg/dL — ABNORMAL HIGH (ref 8–23)
CO2: 21 mmol/L — ABNORMAL LOW (ref 22–32)
Calcium: 8.5 mg/dL — ABNORMAL LOW (ref 8.9–10.3)
Chloride: 110 mmol/L (ref 98–111)
Creatinine, Ser: 2.68 mg/dL — ABNORMAL HIGH (ref 0.61–1.24)
GFR calc Af Amer: 24 mL/min — ABNORMAL LOW (ref >60)
GFR calc non Af Amer: 21 mL/min — ABNORMAL LOW (ref >60)
Glucose, Bld: 86 mg/dL (ref 70–99)
Potassium: 4.5 mmol/L (ref 3.5–5.1)
Sodium: 137 mmol/L (ref 135–145)

## 2018-09-14 NOTE — Progress Notes (Signed)
Provider:  Veleta Miners MD Location:    [facility] Room Number: 130/P Place of Service:  SNF (31)  PCP: [name], MD Patient Care Team: [name], MD as PCP - General (Internal Medicine) [name], MD as Attending Physician (Cardiology)  Extended Emergency Contact Information Primary Emergency Contact: Mayra Neer of [place] Phone: [phone] Relation: Son  Code Status: DNR Goals of Care: Advanced Directive information Advanced Directives 09/14/2018  Does Patient Have a Medical Advance Directive? Yes  Type of Advance Directive Out of facility DNR (pink MOST or yellow form)  Does patient want to make changes to medical advance directive? No - Patient declined  Would patient like information on creating a medical advance directive? No - Patient declined  Pre-existing out of facility DNR order (yellow form or pink MOST form) -      Chief Complaint  Patient presents with  . New Admit To SNF    New Admission Visit    HPI: Patient is a 84 y.o. male seen today for admission to SNF for therapy and IV antibiotics Patient was in the hospital from 10/7 to 10/10 for pyelonephritis and syncope and dehydration.  Patient has h/o Bilateral Nephrostomy Tubes for Symptomatic Uremia in 09/07, Hematuria requiring number of Transfusions, PAF not on any anticoagulation due to Symptomatic Hematuria, C. Diff Colitis, HTN, CKD stage IV, and prostate cancer s/p radical prostatectomy with adjuvant radiation and artifical urinary sphincter (AUS) placement in 2012, GERD, Hyperlipidemia,, Depression. Patient was in [facility] and was discharged home on 10/03 Per patient he fell at home and possibly had a syncopal episode.  He was admitted and was found to be dehydrated. His imaging including CTscan if head or Abdomen was negative for any acute Process.  He also had a urinary tract infection. His urine grew Pseudomonas and he  was started on IV antibiotics.  He also was hydrated in the hospital and was eventually discharged to SNF for therapy and IV antibiotics. Patient lives by himself.  He can walk with a walker.  His son's live out of the city. His only complaint today was some pain on the right upper quadrant.  He denied any nausea vomiting or Fever or Chills.   Past Medical History:  Diagnosis Date  . 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   . Cancer ([place])    prostate ca 1999/removal/ rad tx  . Carotid artery occlusion   . Hyperlipidemia   . Hypertension   . Incontinence   . Reflux   . Renal disorder   . Status post implantation of artificial urinary sphincter    Past Surgical History:  Procedure Laterality Date  . cataracts    . HERNIA REPAIR    . NEPHROSTOMY    . radical prostectomy    . URINARY SPHINCTER IMPLANT      reports that he quit smoking about 41 years ago. His smoking use included cigarettes. He started smoking about 56 years ago. He smoked 2.00 packs per day. He has never used smokeless tobacco. He reports that he does not drink alcohol or use drugs. Social History   Socioeconomic History  . Marital status: Widowed    Spouse name: Not on file  . Number of children: Not on file  . Years of education: Not on file  . Highest education level:  Not on file  Occupational History  . Occupation: retired  Scientific laboratory technician  . Financial resource strain: Not on file  . Food insecurity:    Worry: Not on file    Inability: Not on file  . Transportation needs:    Medical: Not on file    Non-medical: Not on file  Tobacco Use  . Smoking status: Former Smoker    Packs/day: 2.00    Types: Cigarettes    Start date: 08/05/1962    Last attempt to quit: 12/02/1976    Years since quitting: 41.8  . Smokeless tobacco: Never Used  Substance and Sexual Activity  . Alcohol use: No  . Drug use: No  . Sexual activity: Not on file    Lifestyle  . Physical activity:    Days per week: Not on file    Minutes per session: Not on file  . Stress: Not on file  Relationships  . Social connections:    Talks on phone: Not on file    Gets together: Not on file    Attends religious service: Not on file    Active member of club or organization: Not on file    Attends meetings of clubs or organizations: Not on file    Relationship status: Not on file  . Intimate partner violence:    Fear of current or ex partner: Not on file    Emotionally abused: Not on file    Physically abused: Not on file    Forced sexual activity: Not on file  Other Topics Concern  . Not on file  Social History Narrative  . Not on file    Functional Status Survey:    Family History  Problem Relation Age of Onset  . Coronary artery disease Unknown        family history, male < 67  . Arthritis Unknown        family history  . Cancer Unknown   . Kidney disease Unknown     Health Maintenance  Topic Date Due  . INFLUENZA VACCINE  09/30/2018 (Originally 07/02/2018)  . TETANUS/TDAP  09/30/2018 (Originally 10/01/1953)  . PNA vac Low Risk Adult (1 of 2 - PCV13) 09/30/2018 (Originally 09/30/2001)    Allergies  Allergen Reactions  . Ivp Dye [Iodinated Diagnostic Agents]     Due to partial renal failure  . Nalbuphine Other (See Comments)    PATIENT STATES IT MADE HIM FEEL LIKE HE WAS GOING TO DIE. NO SPECIFICS  . Codeine Anxiety    Outpatient Encounter Medications as of 09/14/2018  Medication Sig  . acetaminophen (TYLENOL) 500 MG tablet Take 1,000 mg by mouth every 8 (eight) hours as needed for mild pain or moderate pain. For headache pain   . ALFALFA PO Take 250 mg by mouth daily.   Marland Kitchen amLODipine (NORVASC) 5 MG tablet Take 5 mg by mouth daily.  Marland Kitchen ceFEPIme 1 g in sodium chloride 0.9 % 100 mL Inject 1 g into the vein daily.  . fish oil-omega-3 fatty acids 1000 MG capsule Take 1 g by mouth 2 (two) times daily.   . Influenza Vac Split Quad  (FLUARIX QUADRIVALENT IM) Inject 0.5 mLs into the muscle once.  . Multiple Vitamin (MULTIVITAMIN WITH MINERALS) TABS tablet Take 1 tablet by mouth daily.  . rosuvastatin (CRESTOR) 10 MG tablet Take 10 mg by mouth daily.   . sertraline (ZOLOFT) 100 MG tablet Take 100 mg by mouth daily.  . [DISCONTINUED] sodium chloride 0.9 % injection Inject 3  mLs into the vein daily.  . [DISCONTINUED] sodium chloride 0.9 % injection Inject 3 mLs into the vein 2 (two) times daily.   No facility-administered encounter medications on file as of 09/14/2018.      Review of Systems  Constitutional: Negative.   HENT: Negative.   Respiratory: Negative.   Cardiovascular: Negative.   Gastrointestinal: Positive for abdominal pain. Negative for constipation, diarrhea, nausea and vomiting.  Genitourinary: Negative.   Musculoskeletal: Negative.   Skin: Negative.   Neurological: Positive for weakness.  Psychiatric/Behavioral: Negative.       Vitals:   09/14/18 1048  BP: (!) 112/57  Pulse: 98  Resp: 20  Temp: (!) 97.1 F (36.2 C)  TempSrc: Oral   There is no height or weight on file to calculate BMI. Physical Exam  Constitutional: He is oriented to person, place, and time. He appears well-developed and well-nourished.  HENT:  Head: Normocephalic.  Mouth/Throat: Oropharynx is clear and moist.  Eyes: Pupils are equal, round, and reactive to light.  Neck: Neck supple.  Cardiovascular: Normal rate and regular rhythm.  Pulmonary/Chest: Effort normal and breath sounds normal. No stridor. No respiratory distress. He has no wheezes.  Abdominal: Soft. Bowel sounds are normal. He exhibits no distension.  Mildly Tender in RUQ  Musculoskeletal:  Mild edema  Neurological: He is alert and oriented to person, place, and time.  Skin: Skin is warm and dry.  Psychiatric: He has a normal mood and affect. His behavior is normal. Thought content normal.    Labs reviewed: Basic Metabolic Panel: Recent Labs     09/04/18 0700  09/08/18 0803 09/09/18 0529 09/14/18 0300  NA 140   < > 140 138 137  K 4.2   < > 4.5 4.4 4.5  CL 111   < > 113* 112* 110  CO2 21*   < > 19* 19* 21*  GLUCOSE 96   < > 92 93 86  BUN 43*   < > 48* 42* 42*  CREATININE 2.93*   < > 2.90* 2.52* 2.68*  CALCIUM 8.8*  8.8   < > 8.4* 8.2* 8.5*  PHOS 3.8  --   --   --   --    < > = values in this interval not displayed.   Liver Function Tests: Recent Labs    07/21/18 1425 09/04/18 0700 09/07/18 1228 09/08/18 0803  AST 30  --  26 27  ALT 27  --  28 23  ALKPHOS 82  --  69 76  BILITOT 0.5  --  0.5 0.3  PROT 6.0*  --  6.7 6.7  ALBUMIN 2.9* 2.7* 2.8* 2.7*   No results for input(s): LIPASE, AMYLASE in the last 8760 hours. No results for input(s): AMMONIA in the last 8760 hours. CBC: Recent Labs    08/30/18 1130  09/07/18 1228 09/08/18 0803 09/14/18 0300  WBC 12.8*  --  11.1* 9.2 8.7  NEUTROABS 10.7*  --  9.7*  --  6.2  HGB 9.3*   < > 7.9* 8.2* 7.2*  HCT 29.5*   < > 24.4* 26.0* 23.3*  MCV 91.0  --  88.4 90.3 91.4  PLT 272  --  208 211 183   < > = values in this interval not displayed.   Cardiac Enzymes: Recent Labs    09/07/18 1228 09/07/18 1916 09/08/18 0123 09/08/18 0803  CKTOTAL 52  --   --   --   TROPONINI  --  <0.03 <0.03 <0.03   BNP:  Invalid input(s): POCBNP No results found for: HGBA1C No results found for: TSH No results found for: VITAMINB12 No results found for: FOLATE Lab Results  Component Value Date   IRON 34 (L) 09/04/2018   TIBC 180 (L) 09/04/2018   FERRITIN 502 (H) 09/04/2018    Imaging and Procedures obtained prior to SNF admission: No results found.  Assessment/Plan  UTI Complicated with Nephrostomy Tubes. His Urine was positive for Pseudomonas. He is on IV antibiotics till 10/17 He is Afebrile and White count is normal. Abdominal Pain No Nausea or Vomiting. Ate his breakfast and Lunch Will continue to monitor. Syncope Patient had complete work up in hospital which was  negative and it was thought to be due to dehydration and UTI. Patient asymptomatic Is walking around with the walker and working with therapy Status post bilateral nephrostomy tubes Patient has appointment in October 21 for removal of his tubes. Acute on chronic anemia His last PRBC transfusion was 09/09 and [facility]. It is thought to be due to chronic disease. Repeat Hgb here is low Will continue to follow .  H/O  PAF He was never started on any anticoagulation due to his hematuria and per discharge note from [facility] he stayed mostly in sinus rhythm on monitor in [place] His beta-blocker was also stopped due to bradycardia He needs to follow-up with cardiology Worsening CKD with acidosis Follow-up with renal Depression Continue on Zoloft Discharge planning Discussed with social worker patient would be better if he goes to assisted living but at this time patient has refused and wants to go back home.  Family/ staff Communication:   Labs/tests ordered: Total time spent in this patient care encounter was 45_ minutes; greater than 50% of the visit spent counseling patient, reviewing records , Labs and coordinating care for problems addressed at this encounter.

## 2018-09-15 ENCOUNTER — Encounter: Payer: Self-pay | Admitting: Internal Medicine

## 2018-09-15 ENCOUNTER — Non-Acute Institutional Stay (SKILLED_NURSING_FACILITY): Payer: MEDICARE | Admitting: Internal Medicine

## 2018-09-15 DIAGNOSIS — D631 Anemia in chronic kidney disease: Secondary | ICD-10-CM

## 2018-09-15 DIAGNOSIS — N184 Chronic kidney disease, stage 4 (severe): Secondary | ICD-10-CM | POA: Diagnosis not present

## 2018-09-15 DIAGNOSIS — R42 Dizziness and giddiness: Secondary | ICD-10-CM

## 2018-09-15 DIAGNOSIS — N390 Urinary tract infection, site not specified: Secondary | ICD-10-CM | POA: Diagnosis not present

## 2018-09-15 NOTE — Progress Notes (Signed)
Location:    [place] Room Number: 130/P Place of Service:  SNF (31) Provider:  Veleta Miners MD  [name], MD  Patient Care Team: [name], MD as PCP - General (Internal Medicine) [name], MD as Attending Physician (Cardiology)  Extended Emergency Contact Information Primary Emergency Contact: [name], [name] of [place] Phone: [phone] Relation: Son  Code Status:  DNR Goals of care: Advanced Directive information Advanced Directives 09/15/2018  Does Patient Have a Medical Advance Directive? Yes  Type of Advance Directive Out of facility DNR (pink MOST or yellow form)  Does patient want to make changes to medical advance directive? No - Patient declined  Would patient like information on creating a medical advance directive? No - Patient declined  Pre-existing out of facility DNR order (yellow form or pink MOST form) -     Chief Complaint  Patient presents with  . Acute Visit    Dizzness    HPI:  Pt is a 84 y.o. male seen today for an acute visit for Dizziness  Patient was admitted  to SNF for therapy and IV antibiotics Patient was in the hospital from 10/7 to 10/10 for pyelonephritis and syncope and dehydration.  Patient has h/o Bilateral Nephrostomy Tubes for Symptomatic Uremia in 09/07, Hematuria requiring number of Transfusions, PAF not on any anticoagulation due to Symptomatic Hematuria, C. Diff Colitis, HTN, CKD stage IV, and prostate cancer s/p radical prostatectomy with adjuvant radiation and artifical urinary sphincter (AUS) placement in 2012, GERD, Hyperlipidemia,, Depression. Patient was in [facility] and was discharged home on 10/03  Per patient he fell at home and possibly had a syncopal episode.  He was admitted and was found to be dehydrated. His imaging including CTscan if head or Abdomen was negative for any acute Process.  He also had a urinary tract infection. His urine  grew Pseudomonas and he was started on IV antibiotics.  He also was hydrated in the hospital and was eventually discharged to SNF for therapy and IV antibiotics.  Per therapy patient has been refusing Therapy as he says he feels dizzy.  Orthostatics have been negative so far. Patient told me symptoms more like Vertigo. He denies any Passing out sensation. No Inner Ear complains. Does have some Nausea but mostly resolved now. It is hard to get detail history from the patient .  Past Medical History:  Diagnosis Date  . 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   . Cancer ([place])    prostate ca 1999/removal/ rad tx  . Carotid artery occlusion   . Hyperlipidemia   . Hypertension   . Incontinence   . Reflux   . Renal disorder   . Status post implantation of artificial urinary sphincter    Past Surgical History:  Procedure Laterality Date  . cataracts    . HERNIA REPAIR    . NEPHROSTOMY    . radical prostectomy    . URINARY SPHINCTER IMPLANT      Allergies  Allergen Reactions  . Ivp Dye [Iodinated Diagnostic Agents]     Due to partial renal failure  . Nalbuphine Other (See Comments)    PATIENT STATES IT MADE HIM FEEL LIKE HE WAS GOING TO DIE. NO SPECIFICS  . Codeine Anxiety    Outpatient Encounter Medications as of 09/15/2018  Medication Sig  . acetaminophen (  TYLENOL) 500 MG tablet Take 1,000 mg by mouth every 8 (eight) hours as needed for mild pain or moderate pain. For headache pain   . ALFALFA PO Take 250 mg by mouth daily.   Marland Kitchen amLODipine (NORVASC) 5 MG tablet Take 5 mg by mouth daily.  Marland Kitchen ceFEPIme 1 g in sodium chloride 0.9 % 100 mL Inject 1 g into the vein daily.  . fish oil-omega-3 fatty acids 1000 MG capsule Take 1 g by mouth 2 (two) times daily.   . Influenza Vac Split Quad (FLUARIX QUADRIVALENT IM) Inject 0.5 mLs into the muscle once.  . Multiple Vitamin (MULTIVITAMIN WITH MINERALS) TABS tablet Take 1  tablet by mouth daily.  . Probiotic Product (RISA-BID PROBIOTIC) TABS Give 1 tablet by mouth twice a day from 09/14/2018-09/24/2018  . rosuvastatin (CRESTOR) 10 MG tablet Take 10 mg by mouth daily.   . sertraline (ZOLOFT) 100 MG tablet Take 100 mg by mouth daily.   No facility-administered encounter medications on file as of 09/15/2018.      Review of Systems  Constitutional: Negative.   HENT: Negative.   Respiratory: Negative.   Cardiovascular: Negative.   Gastrointestinal: Negative for abdominal pain, constipation, diarrhea, nausea and vomiting.  Genitourinary: Negative.   Musculoskeletal: Negative.   Skin: Negative.   Neurological: Positive for dizziness and weakness.  Psychiatric/Behavioral: Negative.     Immunization History  Administered Date(s) Administered  . Influenza-Unspecified 09/01/2013   Pertinent  Health Maintenance Due  Topic Date Due  . INFLUENZA VACCINE  09/30/2018 (Originally 07/02/2018)  . PNA vac Low Risk Adult (1 of 2 - PCV13) 09/30/2018 (Originally 09/30/2001)   Fall Risk  07/29/2016  Falls in the past year? No  Comment Emmi Telephone Survey: data to providers prior to load   Functional Status Survey:    There were no vitals filed for this visit. There is no height or weight on file to calculate BMI. Physical Exam  Constitutional: He is oriented to person, place, and time. He appears well-developed and well-nourished.  HENT:  Head: Normocephalic.  Mouth/Throat: Oropharynx is clear and moist.  Eyes: Pupils are equal, round, and reactive to light.  Neck: Neck supple.  Cardiovascular: Normal rate and regular rhythm.  Pulmonary/Chest: Effort normal and breath sounds normal. No stridor. No respiratory distress. He has no wheezes.  Abdominal: Soft. Bowel sounds are normal. He exhibits no distension.  Musculoskeletal:  Mild edema  Neurological: He is alert and oriented to person, place, and time.  C/o Dizziness with Change of Head position. No  Nystagmus  Skin: Skin is warm and dry.  Psychiatric: He has a normal mood and affect. His behavior is normal. Thought content normal.    Labs reviewed: Recent Labs    09/04/18 0700  09/08/18 0803 09/09/18 0529 09/14/18 0300  NA 140   < > 140 138 137  K 4.2   < > 4.5 4.4 4.5  CL 111   < > 113* 112* 110  CO2 21*   < > 19* 19* 21*  GLUCOSE 96   < > 92 93 86  BUN 43*   < > 48* 42* 42*  CREATININE 2.93*   < > 2.90* 2.52* 2.68*  CALCIUM 8.8*  8.8   < > 8.4* 8.2* 8.5*  PHOS 3.8  --   --   --   --    < > = values in this interval not displayed.   Recent Labs    07/21/18 1425 09/04/18 0700 09/07/18 1228 09/08/18  0803  AST 30  --  26 27  ALT 27  --  28 23  ALKPHOS 82  --  69 76  BILITOT 0.5  --  0.5 0.3  PROT 6.0*  --  6.7 6.7  ALBUMIN 2.9* 2.7* 2.8* 2.7*   Recent Labs    08/30/18 1130  09/07/18 1228 09/08/18 0803 09/14/18 0300  WBC 12.8*  --  11.1* 9.2 8.7  NEUTROABS 10.7*  --  9.7*  --  6.2  HGB 9.3*   < > 7.9* 8.2* 7.2*  HCT 29.5*   < > 24.4* 26.0* 23.3*  MCV 91.0  --  88.4 90.3 91.4  PLT 272  --  208 211 183   < > = values in this interval not displayed.   No results found for: TSH No results found for: HGBA1C Lab Results  Component Value Date   CHOL 146 07/26/2008   HDL 30.3 (L) 07/26/2008   LDLCALC 82 07/26/2008   TRIG 171 (H) 07/26/2008   CHOLHDL 4.8 CALC 07/26/2008    Significant Diagnostic Results in last 30 days:  Dg Elbow Complete Right (3+view)  Result Date: 09/07/2018 CLINICAL DATA:  FALL TODAY.  RIGHT ELBOW PAIN. EXAM: RIGHT ELBOW - COMPLETE 3+ VIEW COMPARISON:  None. FINDINGS: Soft tissue abrasion is noted posterior to the olecranon. There is no underlying fracture or foreign body. There is no joint effusion. The joint is located. IMPRESSION: 1. Superficial soft tissue abrasion without underlying fracture. Electronically Signed   By: San Morelle M.D.   On: 09/07/2018 12:46   Ct Head Wo Contrast  Result Date: 09/07/2018 CLINICAL DATA:   84 year old male found sitting on floor this morning. Felt dizzy and fell. Initial encounter. EXAM: CT HEAD WITHOUT CONTRAST TECHNIQUE: Contiguous axial images were obtained from the base of the skull through the vertex without intravenous contrast. COMPARISON:  09/07/2018 neck CT.  08/20/2011 head CT. FINDINGS: Brain: No intracranial hemorrhage or CT evidence of large acute infarct. Moderate chronic microvascular changes. Moderate global atrophy. No intracranial mass lesion noted on this unenhanced exam. Vascular: No hyperdense vessel.  Vascular calcifications. Skull: No skull fracture Sinuses/Orbits: No acute orbital abnormality. Minimal partial opacification inferior aspect right maxillary sinus. Other: Mastoid air cells and middle ear cavities are clear. IMPRESSION: 1. No skull fracture or intracranial hemorrhage. 2. No CT evidence of large acute infarct. 3. Chronic microvascular changes. 4. Global atrophy. Electronically Signed   By: Genia Del M.D.   On: 09/07/2018 18:30   Ct Soft Tissue Neck Wo Contrast  Result Date: 09/07/2018 CLINICAL DATA:  Neck mass.  Solitary, afebrile. EXAM: CT NECK WITHOUT CONTRAST TECHNIQUE: Multidetector CT imaging of the neck was performed following the standard protocol without intravenous contrast. COMPARISON:  None. FINDINGS: Pharynx and larynx: Normal. No mass or swelling. Salivary glands: Atrophic right parotid gland. Left parotid normal. Submandibular glands mildly atrophic bilaterally. Negative for mass or calculus. Thyroid: Negative Lymph nodes: Negative Vascular: Atherosclerotic disease. Limited evaluation of the vessels without intravenous contrast. Limited intracranial: Negative Visualized orbits: Not imaged Mastoids and visualized paranasal sinuses: Mild mucosal edema right maxillary sinus. Otherwise clear. Mastoid clear bilaterally. Skeleton: Cervical spondylosis C5-6 and C6-7. No acute skeletal abnormality. Upper chest: Lung apices clear bilaterally. Other: None  IMPRESSION: Negative for mass or adenopathy in the neck. No acute abnormality identified Atherosclerotic disease. Atrophic right parotid gland and bilateral submandibular glands without acute abnormality. Electronically Signed   By: Franchot Gallo M.D.   On: 09/07/2018 13:25   US Renal  Result Date: 09/07/2018 CLINICAL DATA:  Acute renal failure EXAM: RENAL / URINARY TRACT ULTRASOUND COMPLETE COMPARISON:  CT abdomen and pelvis 07/19/2018 FINDINGS: Right Kidney: Length: 9.0 cm. Normal cortical thickness. Upper normal cortical echogenicity. No mass or shadowing calcification. Mild collecting system dilatation, similar to prior CT. Left Kidney: Length: 9.5 cm. Cortical thinning. Normal cortical echogenicity. No mass, hydronephrosis or shadowing calcification. Mild LEFT renal collecting system dilatation seen on the prior CT exam not definitely visualized on current ultrasound. Bladder: Probable dependent debris.  No focal mass. IMPRESSION: Mild collecting system dilatation of the RIGHT kidney similar to that seen on prior CT exam. Probable dependent debris within urinary bladder. Electronically Signed   By: Lavonia Dana M.D.   On: 09/07/2018 16:46   US Carotid Bilateral  Result Date: 09/08/2018 CLINICAL DATA:  Syncopal episode. Altered mental status. History of CAD and hyperlipidemia. Former smoker. EXAM: BILATERAL CAROTID DUPLEX ULTRASOUND TECHNIQUE: Pearline Cables scale imaging, color Doppler and duplex ultrasound were performed of bilateral carotid and vertebral arteries in the neck. COMPARISON:  None. FINDINGS: Criteria: Quantification of carotid stenosis is based on velocity parameters that correlate the residual internal carotid diameter with NASCET-based stenosis levels, using the diameter of the distal internal carotid lumen as the denominator for stenosis measurement. The following velocity measurements were obtained: RIGHT ICA:  168/35 cm/sec CCA:  8/92 cm/sec SYSTOLIC ICA/CCA RATIO:  1.5 ECA:  132 cm/sec LEFT  ICA:  117/27 cm/sec CCA:  11/94 cm/sec SYSTOLIC ICA/CCA RATIO:  1.2 ECA:  152 cm/sec RIGHT CAROTID ARTERY: There is a minimal to moderate amount of echogenic plaque within the right carotid bulb (images 15 and 17), extending to involve the origin and proximal aspects of the right internal carotid artery (image 25), resulting in elevated peak systolic velocities within the proximal mid aspects the right internal carotid artery. Greatest acquired peak systolic velocity within mid aspect the right internal carotid artery measures 168 centimeters/second (image 31). RIGHT VERTEBRAL ARTERY:  Antegrade Flow LEFT CAROTID ARTERY: There is a moderate amount of echogenic plaque within the left carotid bulb (images 50 and 52). There is a minimal amount of echogenic plaque involving the origin and proximal aspect the left internal carotid artery (image 60), not resulting in elevated peak systolic velocities within the interrogated course the left internal carotid artery to suggest a hemodynamically significant stenosis. LEFT VERTEBRAL ARTERY:  Antegrade Flow IMPRESSION: 1. Minimal to moderate amount of right-sided atherosclerotic plaque results in elevated peak systolic velocities within the right internal carotid artery compatible with the 50-69% luminal narrowing range. Further evaluation with CTA could performed as clinically indicated. 2. Minimal to moderate amount of left-sided atherosclerotic plaque, not definitely resulting in a hemodynamically significant stenosis. Electronically Signed   By: Sandi Mariscal M.D.   On: 09/08/2018 12:57   Dg Chest Port 1 View  Result Date: 09/07/2018 CLINICAL DATA:  Weakness, fall EXAM: PORTABLE CHEST 1 VIEW COMPARISON:  06/11/2017, 04/29/2018 FINDINGS: There is right basilar scarring with tenting of the diaphragm. There is mild lingular scarring. There is no focal consolidation. There is no pleural effusion or pneumothorax. The heart and mediastinal contours are unremarkable. The osseous  structures are unremarkable. IMPRESSION: No acute cardiopulmonary disease. Electronically Signed   By: Kathreen Devoid   On: 09/07/2018 12:50    Assessment/Plan  Dizziness ? BPPV. His CT scan in the hospital was negative Will start him on Meclizine Prn to see if it helps. Was not orthostatic per therapy. Also Recheck BMP and CBC tomorrow. UTI Complicated  with Nephrostomy Tubes. His Urine was positive for Pseudomonas. He is on IV antibiotics till 10/17 Status post bilateral nephrostomy tubes Patient has appointment in October 21 for removal of his tubes. Acute on chronic anemia His last PRBC transfusion was 09/09 and [facility]. It is thought to be due to chronic disease. Repeat Hgb here is low Will Repeat CBC tomorrow.  H/O  PAF He was never started on any anticoagulation due to his hematuria and per discharge note from [facility] he stayed mostly in sinus rhythm on monitor in [place] His beta-blocker was also stopped due to bradycardia He needs to follow-up with cardiology Worsening CKD with acidosis Follow-up with renal Depression Continue on Zoloft Discharge planning Discussed with social worker and patient again would be better if he goes to assisted living but at this time patient has refused and [place] that he wants to go back home.     Family/ staff Communication:   Labs/tests ordered:

## 2018-09-16 ENCOUNTER — Encounter (HOSPITAL_COMMUNITY)
Admission: RE | Admit: 2018-09-16 | Discharge: 2018-09-16 | Disposition: A | Discharge status: Home / Self Care | Payer: MEDICARE | Source: Skilled Nursing Facility | Attending: Internal Medicine | Admitting: Internal Medicine

## 2018-09-16 DIAGNOSIS — R55 Syncope and collapse: Secondary | ICD-10-CM | POA: Insufficient documentation

## 2018-09-16 DIAGNOSIS — S01412D Laceration without foreign body of left cheek and temporomandibular area, subsequent encounter: Secondary | ICD-10-CM | POA: Insufficient documentation

## 2018-09-16 DIAGNOSIS — N390 Urinary tract infection, site not specified: Secondary | ICD-10-CM | POA: Insufficient documentation

## 2018-09-16 DIAGNOSIS — S50311D Abrasion of right elbow, subsequent encounter: Secondary | ICD-10-CM | POA: Insufficient documentation

## 2018-09-16 LAB — CBC WITH DIFFERENTIAL/PLATELET
Abs Immature Granulocytes: 0.02 10*3/uL (ref 0.00–0.07)
Basophils Absolute: 0 10*3/uL (ref 0.0–0.1)
Basophils Relative: 0 %
Eosinophils Absolute: 0.2 10*3/uL (ref 0.0–0.5)
Eosinophils Relative: 3 %
HCT: 24.3 % — ABNORMAL LOW (ref 39.0–52.0)
Hemoglobin: 7.5 g/dL — ABNORMAL LOW (ref 13.0–17.0)
Immature Granulocytes: 0 %
Lymphocytes Relative: 20 %
Lymphs Abs: 1.5 10*3/uL (ref 0.7–4.0)
MCH: 28.7 pg (ref 26.0–34.0)
MCHC: 30.9 g/dL (ref 30.0–36.0)
MCV: 93.1 fL (ref 80.0–100.0)
Monocytes Absolute: 0.5 10*3/uL (ref 0.1–1.0)
Monocytes Relative: 7 %
Neutro Abs: 5.1 10*3/uL (ref 1.7–7.7)
Neutrophils Relative %: 70 %
Platelets: 208 10*3/uL (ref 150–400)
RBC: 2.61 MIL/uL — ABNORMAL LOW (ref 4.22–5.81)
RDW: 16 % — ABNORMAL HIGH (ref 11.5–15.5)
WBC: 7.3 10*3/uL (ref 4.0–10.5)
nRBC: 0 % (ref 0.0–0.2)

## 2018-09-16 LAB — COMPREHENSIVE METABOLIC PANEL
ALT: 49 U/L — ABNORMAL HIGH (ref 0–44)
AST: 61 U/L — ABNORMAL HIGH (ref 15–41)
Albumin: 2.8 g/dL — ABNORMAL LOW (ref 3.5–5.0)
Alkaline Phosphatase: 74 U/L (ref 38–126)
Anion gap: 6 (ref 5–15)
BUN: 39 mg/dL — ABNORMAL HIGH (ref 8–23)
CO2: 20 mmol/L — ABNORMAL LOW (ref 22–32)
Calcium: 8.7 mg/dL — ABNORMAL LOW (ref 8.9–10.3)
Chloride: 111 mmol/L (ref 98–111)
Creatinine, Ser: 2.44 mg/dL — ABNORMAL HIGH (ref 0.61–1.24)
GFR calc Af Amer: 27 mL/min — ABNORMAL LOW (ref >60)
GFR calc non Af Amer: 23 mL/min — ABNORMAL LOW (ref >60)
Glucose, Bld: 96 mg/dL (ref 70–99)
Potassium: 4.3 mmol/L (ref 3.5–5.1)
Sodium: 137 mmol/L (ref 135–145)
Total Bilirubin: 0.3 mg/dL (ref 0.3–1.2)
Total Protein: 6.7 g/dL (ref 6.5–8.1)

## 2018-09-17 ENCOUNTER — Encounter (HOSPITAL_COMMUNITY)
Admission: RE | Admit: 2018-09-17 | Discharge: 2018-09-17 | Disposition: A | Discharge status: Home / Self Care | Payer: MEDICARE | Source: Skilled Nursing Facility | Attending: Internal Medicine | Admitting: Internal Medicine

## 2018-09-17 ENCOUNTER — Non-Acute Institutional Stay (SKILLED_NURSING_FACILITY): Payer: MEDICARE | Admitting: Internal Medicine

## 2018-09-17 ENCOUNTER — Encounter: Payer: Self-pay | Admitting: Internal Medicine

## 2018-09-17 DIAGNOSIS — N390 Urinary tract infection, site not specified: Secondary | ICD-10-CM

## 2018-09-17 DIAGNOSIS — R55 Syncope and collapse: Secondary | ICD-10-CM | POA: Insufficient documentation

## 2018-09-17 DIAGNOSIS — D631 Anemia in chronic kidney disease: Secondary | ICD-10-CM | POA: Diagnosis not present

## 2018-09-17 DIAGNOSIS — I10 Essential (primary) hypertension: Secondary | ICD-10-CM | POA: Insufficient documentation

## 2018-09-17 DIAGNOSIS — S50311D Abrasion of right elbow, subsequent encounter: Secondary | ICD-10-CM | POA: Insufficient documentation

## 2018-09-17 DIAGNOSIS — S01412D Laceration without foreign body of left cheek and temporomandibular area, subsequent encounter: Secondary | ICD-10-CM | POA: Insufficient documentation

## 2018-09-17 DIAGNOSIS — N184 Chronic kidney disease, stage 4 (severe): Secondary | ICD-10-CM

## 2018-09-17 LAB — COMPREHENSIVE METABOLIC PANEL
ALT: 55 U/L — ABNORMAL HIGH (ref 0–44)
AST: 63 U/L — ABNORMAL HIGH (ref 15–41)
Albumin: 2.9 g/dL — ABNORMAL LOW (ref 3.5–5.0)
Alkaline Phosphatase: 76 U/L (ref 38–126)
Anion gap: 10 (ref 5–15)
BUN: 36 mg/dL — ABNORMAL HIGH (ref 8–23)
CO2: 18 mmol/L — ABNORMAL LOW (ref 22–32)
Calcium: 9 mg/dL (ref 8.9–10.3)
Chloride: 109 mmol/L (ref 98–111)
Creatinine, Ser: 2.51 mg/dL — ABNORMAL HIGH (ref 0.61–1.24)
GFR calc Af Amer: 26 mL/min — ABNORMAL LOW (ref >60)
GFR calc non Af Amer: 22 mL/min — ABNORMAL LOW (ref >60)
Glucose, Bld: 150 mg/dL — ABNORMAL HIGH (ref 70–99)
Potassium: 3.9 mmol/L (ref 3.5–5.1)
Sodium: 137 mmol/L (ref 135–145)
Total Bilirubin: 0.4 mg/dL (ref 0.3–1.2)
Total Protein: 7.4 g/dL (ref 6.5–8.1)

## 2018-09-17 LAB — CBC WITH DIFFERENTIAL/PLATELET
Abs Immature Granulocytes: 0.02 10*3/uL (ref 0.00–0.07)
Basophils Absolute: 0 10*3/uL (ref 0.0–0.1)
Basophils Relative: 1 %
Eosinophils Absolute: 0.3 10*3/uL (ref 0.0–0.5)
Eosinophils Relative: 4 %
HCT: 28.7 % — ABNORMAL LOW (ref 39.0–52.0)
Hemoglobin: 8.7 g/dL — ABNORMAL LOW (ref 13.0–17.0)
Immature Granulocytes: 0 %
Lymphocytes Relative: 16 %
Lymphs Abs: 1.2 10*3/uL (ref 0.7–4.0)
MCH: 28.2 pg (ref 26.0–34.0)
MCHC: 30.3 g/dL (ref 30.0–36.0)
MCV: 93.2 fL (ref 80.0–100.0)
Monocytes Absolute: 0.4 10*3/uL (ref 0.1–1.0)
Monocytes Relative: 6 %
Neutro Abs: 5.5 10*3/uL (ref 1.7–7.7)
Neutrophils Relative %: 73 %
Platelets: 259 10*3/uL (ref 150–400)
RBC: 3.08 MIL/uL — ABNORMAL LOW (ref 4.22–5.81)
RDW: 16 % — ABNORMAL HIGH (ref 11.5–15.5)
WBC: 7.5 10*3/uL (ref 4.0–10.5)
nRBC: 0 % (ref 0.0–0.2)

## 2018-09-17 NOTE — Progress Notes (Signed)
Location:    [facility] Room Number: 130/P Place of Service:  SNF (31)  Provider: Veleta Miners MD  PCP: [name], MD Patient Care Team: [name], MD as PCP - General (Internal Medicine) [name], MD as Attending Physician (Cardiology)  Extended Emergency Contact Information Primary Emergency Contact: [name], [name] of [place] Phone: [phone] Relation: Son  Code Status: DNR Goals of care:  Advanced Directive information Advanced Directives 09/15/2018  Does Patient Have a Medical Advance Directive? Yes  Type of Advance Directive Out of facility DNR (pink MOST or yellow form)  Does patient want to make changes to medical advance directive? No - Patient declined  Would patient like information on creating a medical advance directive? No - Patient declined  Pre-existing out of facility DNR order (yellow form or pink MOST form) -     Allergies  Allergen Reactions  . Ivp Dye [Iodinated Diagnostic Agents]     Due to partial renal failure  . Nalbuphine Other (See Comments)    PATIENT STATES IT MADE HIM FEEL LIKE HE WAS GOING TO DIE. NO SPECIFICS  . Codeine Anxiety    Chief Complaint  Patient presents with  . Discharge Note    Discharge Visit    HPI:  84 y.o. male  Seen today for discharge from facility.  Patient has h/o Bilateral Nephrostomy Tubes for Symptomatic Uremia in 09/07, Hematuria requiring number of Transfusions, PAF not on any anticoagulation due to Symptomatic Hematuria, C. Diff Colitis, HTN, CKD stage IV, and prostate cancer s/p radical prostatectomy with adjuvant radiation and artifical urinary sphincter (AUS) placement in 2012, GERD, Hyperlipidemia,, Depression. Patient was in [facility] and was discharged home on 10/03 Per patient he fell at home and possibly had a syncopal episode.  He was admitted and was found to be dehydrated. His imaging including CTscan if head or Abdomen  was negative for any acute Process.  He also had a urinary tract infection. His urine grew Pseudomonas and he was started on IV antibiotics.  He also was hydrated in the hospital and was eventually discharged to SNF for therapy and IV antibiotics. In the Facility patient did not receive much therapy as he was walking with the walker and refused PT. He did not have any fever or Chills. And tolerated Antibiotics.  He did c/o Some Dizziness. Postural BP were not Significant . He tried PRN Meclizine and said it helped. Patient lives by himself.     Past Medical History:  Diagnosis Date  . 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   . Cancer ([place])    prostate ca 1999/removal/ rad tx  . Carotid artery occlusion   . Hyperlipidemia   . Hypertension   . Incontinence   . Reflux   . Renal disorder   . Status post implantation of artificial urinary sphincter     Past Surgical History:  Procedure Laterality Date  . cataracts    . HERNIA REPAIR    . NEPHROSTOMY    . radical prostectomy    . URINARY SPHINCTER IMPLANT        reports that he quit smoking about 41 years ago. His smoking use included cigarettes. He started smoking about 56 years ago. He smoked 2.00 packs per day. He has never used smokeless tobacco. He reports that he does not drink  alcohol or use drugs. Social History   Socioeconomic History  . Marital status: Widowed    Spouse name: Not on file  . Number of children: Not on file  . Years of education: Not on file  . Highest education level: Not on file  Occupational History  . Occupation: retired  Scientific laboratory technician  . Financial resource strain: Not on file  . Food insecurity:    Worry: Not on file    Inability: Not on file  . Transportation needs:    Medical: Not on file    Non-medical: Not on file  Tobacco Use  . Smoking status: Former Smoker    Packs/day: 2.00    Types: Cigarettes    Start date:  08/05/1962    Last attempt to quit: 12/02/1976    Years since quitting: 41.8  . Smokeless tobacco: Never Used  Substance and Sexual Activity  . Alcohol use: No  . Drug use: No  . Sexual activity: Not on file  Lifestyle  . Physical activity:    Days per week: Not on file    Minutes per session: Not on file  . Stress: Not on file  Relationships  . Social connections:    Talks on phone: Not on file    Gets together: Not on file    Attends religious service: Not on file    Active member of club or organization: Not on file    Attends meetings of clubs or organizations: Not on file    Relationship status: Not on file  . Intimate partner violence:    Fear of current or ex partner: Not on file    Emotionally abused: Not on file    Physically abused: Not on file    Forced sexual activity: Not on file  Other Topics Concern  . Not on file  Social History Narrative  . Not on file   Functional Status Survey:    Allergies  Allergen Reactions  . Ivp Dye [Iodinated Diagnostic Agents]     Due to partial renal failure  . Nalbuphine Other (See Comments)    PATIENT STATES IT MADE HIM FEEL LIKE HE WAS GOING TO DIE. NO SPECIFICS  . Codeine Anxiety    Pertinent  Health Maintenance Due  Topic Date Due  . PNA vac Low Risk Adult (2 of 2 - PPSV23) 09/30/2018 (Originally 07/10/2016)  . INFLUENZA VACCINE  Completed    Medications: Outpatient Encounter Medications as of 09/17/2018  Medication Sig  . acetaminophen (TYLENOL) 500 MG tablet Take 1,000 mg by mouth every 8 (eight) hours as needed for mild pain or moderate pain. For headache pain   . ALFALFA PO Take 250 mg by mouth daily.   Marland Kitchen amLODipine (NORVASC) 5 MG tablet Take 5 mg by mouth daily.  Marland Kitchen ceFEPIme 1 g in sodium chloride 0.9 % 100 mL Inject 1 g into the vein daily.  . fish oil-omega-3 fatty acids 1000 MG capsule Take 1 g by mouth 2 (two) times daily.   . meclizine (ANTIVERT) 12.5 MG tablet Take 12.5 mg by mouth daily.  . Multiple  Vitamin (MULTIVITAMIN WITH MINERALS) TABS tablet Take 1 tablet by mouth daily.  . Probiotic Product (RISA-BID PROBIOTIC) TABS Give 1 tablet by mouth twice a day from 09/14/2018-09/24/2018  . rosuvastatin (CRESTOR) 10 MG tablet Take 10 mg by mouth daily.   . sertraline (ZOLOFT) 100 MG tablet Take 100 mg by mouth daily.  . [DISCONTINUED] Influenza Vac Split Quad (FLUARIX QUADRIVALENT IM) Inject 0.5  mLs into the muscle once.   No facility-administered encounter medications on file as of 09/17/2018.      Review of Systems  Vitals:   09/17/18 1145  BP: 115/64  Pulse: 96  Resp: 17  Temp: 98 F (36.7 C)  SpO2: 99%   There is no height or weight on file to calculate BMI. Physical Exam  Constitutional: He is oriented to person, place, and time. He appears well-developed and well-nourished.  HENT:  Head: Normocephalic.  Mouth/Throat: Oropharynx is clear and moist.  Eyes: Pupils are equal, round, and reactive to light.  Neck: Neck supple.  Cardiovascular: Normal rate and regular rhythm.  Pulmonary/Chest: Effort normal and breath sounds normal. No stridor. No respiratory distress. He has no wheezes.  Abdominal: Soft. Bowel sounds are normal. He exhibits no distension.  Musculoskeletal:  Mild edema  Neurological: He is alert and oriented to person, place, and time.  C/o Dizziness with Change of Head position. No Nystagmus  Skin: Skin is warm and dry.  Psychiatric: He has a normal mood and affect. His behavior is normal. Thought content normal.    Labs reviewed: Basic Metabolic Panel: Recent Labs    09/04/18 0700  09/14/18 0300 09/16/18 0700 09/17/18 0736  NA 140   < > 137 137 137  K 4.2   < > 4.5 4.3 3.9  CL 111   < > 110 111 109  CO2 21*   < > 21* 20* 18*  GLUCOSE 96   < > 86 96 150*  BUN 43*   < > 42* 39* 36*  CREATININE 2.93*   < > 2.68* 2.44* 2.51*  CALCIUM 8.8*  8.8   < > 8.5* 8.7* 9.0  PHOS 3.8  --   --   --   --    < > = values in this interval not displayed.    Liver Function Tests: Recent Labs    09/08/18 0803 09/16/18 0700 09/17/18 0736  AST 27 61* 63*  ALT 23 49* 55*  ALKPHOS 76 74 76  BILITOT 0.3 0.3 0.4  PROT 6.7 6.7 7.4  ALBUMIN 2.7* 2.8* 2.9*   No results for input(s): LIPASE, AMYLASE in the last 8760 hours. No results for input(s): AMMONIA in the last 8760 hours. CBC: Recent Labs    09/14/18 0300 09/16/18 0700 09/17/18 0736  WBC 8.7 7.3 7.5  NEUTROABS 6.2 5.1 5.5  HGB 7.2* 7.5* 8.7*  HCT 23.3* 24.3* 28.7*  MCV 91.4 93.1 93.2  PLT 183 208 259   Cardiac Enzymes: Recent Labs    09/07/18 1228 09/07/18 1916 09/08/18 0123 09/08/18 0803  CKTOTAL 52  --   --   --   TROPONINI  --  <0.03 <0.03 <0.03   BNP: Invalid input(s): POCBNP CBG: Recent Labs    09/07/18 1034 09/09/18 2234  GLUCAP 115* 97    Procedures and Imaging Studies During Stay: Dg Elbow Complete Right (3+view)  Result Date: 09/07/2018 CLINICAL DATA:  FALL TODAY.  RIGHT ELBOW PAIN. EXAM: RIGHT ELBOW - COMPLETE 3+ VIEW COMPARISON:  None. FINDINGS: Soft tissue abrasion is noted posterior to the olecranon. There is no underlying fracture or foreign body. There is no joint effusion. The joint is located. IMPRESSION: 1. Superficial soft tissue abrasion without underlying fracture. Electronically Signed   By: San Morelle M.D.   On: 09/07/2018 12:46   Ct Head Wo Contrast  Result Date: 09/07/2018 CLINICAL DATA:  84 year old male found sitting on floor this morning. Felt dizzy and fell.  Initial encounter. EXAM: CT HEAD WITHOUT CONTRAST TECHNIQUE: Contiguous axial images were obtained from the base of the skull through the vertex without intravenous contrast. COMPARISON:  09/07/2018 neck CT.  08/20/2011 head CT. FINDINGS: Brain: No intracranial hemorrhage or CT evidence of large acute infarct. Moderate chronic microvascular changes. Moderate global atrophy. No intracranial mass lesion noted on this unenhanced exam. Vascular: No hyperdense vessel.  Vascular  calcifications. Skull: No skull fracture Sinuses/Orbits: No acute orbital abnormality. Minimal partial opacification inferior aspect right maxillary sinus. Other: Mastoid air cells and middle ear cavities are clear. IMPRESSION: 1. No skull fracture or intracranial hemorrhage. 2. No CT evidence of large acute infarct. 3. Chronic microvascular changes. 4. Global atrophy. Electronically Signed   By: Genia Del M.D.   On: 09/07/2018 18:30   Ct Soft Tissue Neck Wo Contrast  Result Date: 09/07/2018 CLINICAL DATA:  Neck mass.  Solitary, afebrile. EXAM: CT NECK WITHOUT CONTRAST TECHNIQUE: Multidetector CT imaging of the neck was performed following the standard protocol without intravenous contrast. COMPARISON:  None. FINDINGS: Pharynx and larynx: Normal. No mass or swelling. Salivary glands: Atrophic right parotid gland. Left parotid normal. Submandibular glands mildly atrophic bilaterally. Negative for mass or calculus. Thyroid: Negative Lymph nodes: Negative Vascular: Atherosclerotic disease. Limited evaluation of the vessels without intravenous contrast. Limited intracranial: Negative Visualized orbits: Not imaged Mastoids and visualized paranasal sinuses: Mild mucosal edema right maxillary sinus. Otherwise clear. Mastoid clear bilaterally. Skeleton: Cervical spondylosis C5-6 and C6-7. No acute skeletal abnormality. Upper chest: Lung apices clear bilaterally. Other: None IMPRESSION: Negative for mass or adenopathy in the neck. No acute abnormality identified Atherosclerotic disease. Atrophic right parotid gland and bilateral submandibular glands without acute abnormality. Electronically Signed   By: Franchot Gallo M.D.   On: 09/07/2018 13:25   US Renal  Result Date: 09/07/2018 CLINICAL DATA:  Acute renal failure EXAM: RENAL / URINARY TRACT ULTRASOUND COMPLETE COMPARISON:  CT abdomen and pelvis 07/19/2018 FINDINGS: Right Kidney: Length: 9.0 cm. Normal cortical thickness. Upper normal cortical echogenicity. No  mass or shadowing calcification. Mild collecting system dilatation, similar to prior CT. Left Kidney: Length: 9.5 cm. Cortical thinning. Normal cortical echogenicity. No mass, hydronephrosis or shadowing calcification. Mild LEFT renal collecting system dilatation seen on the prior CT exam not definitely visualized on current ultrasound. Bladder: Probable dependent debris.  No focal mass. IMPRESSION: Mild collecting system dilatation of the RIGHT kidney similar to that seen on prior CT exam. Probable dependent debris within urinary bladder. Electronically Signed   By: Lavonia Dana M.D.   On: 09/07/2018 16:46   US Carotid Bilateral  Result Date: 09/08/2018 CLINICAL DATA:  Syncopal episode. Altered mental status. History of CAD and hyperlipidemia. Former smoker. EXAM: BILATERAL CAROTID DUPLEX ULTRASOUND TECHNIQUE: Pearline Cables scale imaging, color Doppler and duplex ultrasound were performed of bilateral carotid and vertebral arteries in the neck. COMPARISON:  None. FINDINGS: Criteria: Quantification of carotid stenosis is based on velocity parameters that correlate the residual internal carotid diameter with NASCET-based stenosis levels, using the diameter of the distal internal carotid lumen as the denominator for stenosis measurement. The following velocity measurements were obtained: RIGHT ICA:  168/35 cm/sec CCA:  2/11 cm/sec SYSTOLIC ICA/CCA RATIO:  1.5 ECA:  132 cm/sec LEFT ICA:  117/27 cm/sec CCA:  94/17 cm/sec SYSTOLIC ICA/CCA RATIO:  1.2 ECA:  152 cm/sec RIGHT CAROTID ARTERY: There is a minimal to moderate amount of echogenic plaque within the right carotid bulb (images 15 and 17), extending to involve the origin and proximal aspects of the  right internal carotid artery (image 25), resulting in elevated peak systolic velocities within the proximal mid aspects the right internal carotid artery. Greatest acquired peak systolic velocity within mid aspect the right internal carotid artery measures 168  centimeters/second (image 31). RIGHT VERTEBRAL ARTERY:  Antegrade Flow LEFT CAROTID ARTERY: There is a moderate amount of echogenic plaque within the left carotid bulb (images 50 and 52). There is a minimal amount of echogenic plaque involving the origin and proximal aspect the left internal carotid artery (image 60), not resulting in elevated peak systolic velocities within the interrogated course the left internal carotid artery to suggest a hemodynamically significant stenosis. LEFT VERTEBRAL ARTERY:  Antegrade Flow IMPRESSION: 1. Minimal to moderate amount of right-sided atherosclerotic plaque results in elevated peak systolic velocities within the right internal carotid artery compatible with the 50-69% luminal narrowing range. Further evaluation with CTA could performed as clinically indicated. 2. Minimal to moderate amount of left-sided atherosclerotic plaque, not definitely resulting in a hemodynamically significant stenosis. Electronically Signed   By: Sandi Mariscal M.D.   On: 09/08/2018 12:57   Dg Chest Port 1 View  Result Date: 09/07/2018 CLINICAL DATA:  Weakness, fall EXAM: PORTABLE CHEST 1 VIEW COMPARISON:  06/11/2017, 04/29/2018 FINDINGS: There is right basilar scarring with tenting of the diaphragm. There is mild lingular scarring. There is no focal consolidation. There is no pleural effusion or pneumothorax. The heart and mediastinal contours are unremarkable. The osseous structures are unremarkable. IMPRESSION: No acute cardiopulmonary disease. Electronically Signed   By: Kathreen Devoid   On: 09/07/2018 12:50    Assessment/Plan:   UTI Complicated with Nephrostomy Tubes. His Urine was positive for Pseudomonas. He finished 1 week of Antibiotics today He is Afebrile and White count is normal. Syncope Patient had complete work up in hospital which was negative and it was thought to be due to dehydration and UTI. Patient asymptomatic.  Is walking around with the walker   Status post  bilateral nephrostomy tubes Patient has appointment in October 21 for removal of his tubes.  Acute on chronic anemia His last PRBC transfusion was 09/09 and [facility]. It is thought to be due to chronic disease. Repeat Hgb here has been Stable.  H/O  PAF He was never started on any anticoagulation due to his hematuria and per discharge note from [facility] he stayed mostly in sinus rhythm on monitor in [place] His beta-blocker was also stopped due to bradycardia He needs to follow-up with cardiology Worsening CKD with acidosis Follow-up with renal Depression Continue on Zoloft Discharge planning Discussed with the Patient for need to move in ALF but at this time patient wants to go home He has sons who live out of town but has friends who check on him Home Nursing Future labs/tests needed:   Discharge Planning of more then 35 min.

## 2018-09-20 DIAGNOSIS — N3001 Acute cystitis with hematuria: Secondary | ICD-10-CM | POA: Diagnosis not present

## 2018-09-20 DIAGNOSIS — I130 Hypertensive heart and chronic kidney disease with heart failure and stage 1 through stage 4 chronic kidney disease, or unspecified chronic kidney disease: Secondary | ICD-10-CM | POA: Diagnosis not present

## 2018-09-20 DIAGNOSIS — C61 Malignant neoplasm of prostate: Secondary | ICD-10-CM | POA: Diagnosis not present

## 2018-09-20 DIAGNOSIS — Z466 Encounter for fitting and adjustment of urinary device: Secondary | ICD-10-CM | POA: Diagnosis not present

## 2018-09-20 DIAGNOSIS — Z436 Encounter for attention to other artificial openings of urinary tract: Secondary | ICD-10-CM | POA: Diagnosis not present

## 2018-09-20 DIAGNOSIS — L89150 Pressure ulcer of sacral region, unstageable: Secondary | ICD-10-CM | POA: Diagnosis not present

## 2018-09-21 DIAGNOSIS — Z466 Encounter for fitting and adjustment of urinary device: Secondary | ICD-10-CM | POA: Diagnosis not present

## 2018-09-23 DIAGNOSIS — N3001 Acute cystitis with hematuria: Secondary | ICD-10-CM | POA: Diagnosis not present

## 2018-09-23 DIAGNOSIS — I130 Hypertensive heart and chronic kidney disease with heart failure and stage 1 through stage 4 chronic kidney disease, or unspecified chronic kidney disease: Secondary | ICD-10-CM | POA: Diagnosis not present

## 2018-09-23 DIAGNOSIS — C61 Malignant neoplasm of prostate: Secondary | ICD-10-CM | POA: Diagnosis not present

## 2018-09-23 DIAGNOSIS — L89150 Pressure ulcer of sacral region, unstageable: Secondary | ICD-10-CM | POA: Diagnosis not present

## 2018-09-23 DIAGNOSIS — Z466 Encounter for fitting and adjustment of urinary device: Secondary | ICD-10-CM | POA: Diagnosis not present

## 2018-09-23 DIAGNOSIS — Z436 Encounter for attention to other artificial openings of urinary tract: Secondary | ICD-10-CM | POA: Diagnosis not present

## 2018-09-24 DIAGNOSIS — Z466 Encounter for fitting and adjustment of urinary device: Secondary | ICD-10-CM | POA: Diagnosis not present

## 2018-09-24 DIAGNOSIS — C61 Malignant neoplasm of prostate: Secondary | ICD-10-CM | POA: Diagnosis not present

## 2018-09-24 DIAGNOSIS — N3001 Acute cystitis with hematuria: Secondary | ICD-10-CM | POA: Diagnosis not present

## 2018-09-24 DIAGNOSIS — Z436 Encounter for attention to other artificial openings of urinary tract: Secondary | ICD-10-CM | POA: Diagnosis not present

## 2018-09-24 DIAGNOSIS — L89150 Pressure ulcer of sacral region, unstageable: Secondary | ICD-10-CM | POA: Diagnosis not present

## 2018-09-24 DIAGNOSIS — I130 Hypertensive heart and chronic kidney disease with heart failure and stage 1 through stage 4 chronic kidney disease, or unspecified chronic kidney disease: Secondary | ICD-10-CM | POA: Diagnosis not present

## 2018-09-25 DIAGNOSIS — L89150 Pressure ulcer of sacral region, unstageable: Secondary | ICD-10-CM | POA: Diagnosis not present

## 2018-09-25 DIAGNOSIS — N3001 Acute cystitis with hematuria: Secondary | ICD-10-CM | POA: Diagnosis not present

## 2018-09-25 DIAGNOSIS — C61 Malignant neoplasm of prostate: Secondary | ICD-10-CM | POA: Diagnosis not present

## 2018-09-25 DIAGNOSIS — Z466 Encounter for fitting and adjustment of urinary device: Secondary | ICD-10-CM | POA: Diagnosis not present

## 2018-09-25 DIAGNOSIS — Z436 Encounter for attention to other artificial openings of urinary tract: Secondary | ICD-10-CM | POA: Diagnosis not present

## 2018-09-25 DIAGNOSIS — I130 Hypertensive heart and chronic kidney disease with heart failure and stage 1 through stage 4 chronic kidney disease, or unspecified chronic kidney disease: Secondary | ICD-10-CM | POA: Diagnosis not present

## 2018-09-30 DIAGNOSIS — L89150 Pressure ulcer of sacral region, unstageable: Secondary | ICD-10-CM | POA: Diagnosis not present

## 2018-09-30 DIAGNOSIS — Z436 Encounter for attention to other artificial openings of urinary tract: Secondary | ICD-10-CM | POA: Diagnosis not present

## 2018-09-30 DIAGNOSIS — C61 Malignant neoplasm of prostate: Secondary | ICD-10-CM | POA: Diagnosis not present

## 2018-09-30 DIAGNOSIS — I130 Hypertensive heart and chronic kidney disease with heart failure and stage 1 through stage 4 chronic kidney disease, or unspecified chronic kidney disease: Secondary | ICD-10-CM | POA: Diagnosis not present

## 2018-09-30 DIAGNOSIS — N3001 Acute cystitis with hematuria: Secondary | ICD-10-CM | POA: Diagnosis not present

## 2018-09-30 DIAGNOSIS — Z466 Encounter for fitting and adjustment of urinary device: Secondary | ICD-10-CM | POA: Diagnosis not present

## 2018-10-01 DIAGNOSIS — N3001 Acute cystitis with hematuria: Secondary | ICD-10-CM | POA: Diagnosis not present

## 2018-10-01 DIAGNOSIS — Z466 Encounter for fitting and adjustment of urinary device: Secondary | ICD-10-CM | POA: Diagnosis not present

## 2018-10-01 DIAGNOSIS — Z436 Encounter for attention to other artificial openings of urinary tract: Secondary | ICD-10-CM | POA: Diagnosis not present

## 2018-10-01 DIAGNOSIS — C61 Malignant neoplasm of prostate: Secondary | ICD-10-CM | POA: Diagnosis not present

## 2018-10-01 DIAGNOSIS — L89150 Pressure ulcer of sacral region, unstageable: Secondary | ICD-10-CM | POA: Diagnosis not present

## 2018-10-01 DIAGNOSIS — I130 Hypertensive heart and chronic kidney disease with heart failure and stage 1 through stage 4 chronic kidney disease, or unspecified chronic kidney disease: Secondary | ICD-10-CM | POA: Diagnosis not present

## 2018-10-02 DIAGNOSIS — N3001 Acute cystitis with hematuria: Secondary | ICD-10-CM | POA: Diagnosis not present

## 2018-10-02 DIAGNOSIS — Z466 Encounter for fitting and adjustment of urinary device: Secondary | ICD-10-CM | POA: Diagnosis not present

## 2018-10-02 DIAGNOSIS — Z436 Encounter for attention to other artificial openings of urinary tract: Secondary | ICD-10-CM | POA: Diagnosis not present

## 2018-10-02 DIAGNOSIS — C61 Malignant neoplasm of prostate: Secondary | ICD-10-CM | POA: Diagnosis not present

## 2018-10-02 DIAGNOSIS — L89150 Pressure ulcer of sacral region, unstageable: Secondary | ICD-10-CM | POA: Diagnosis not present

## 2018-10-02 DIAGNOSIS — I130 Hypertensive heart and chronic kidney disease with heart failure and stage 1 through stage 4 chronic kidney disease, or unspecified chronic kidney disease: Secondary | ICD-10-CM | POA: Diagnosis not present

## 2018-10-06 DIAGNOSIS — I2510 Atherosclerotic heart disease of native coronary artery without angina pectoris: Secondary | ICD-10-CM | POA: Diagnosis not present

## 2018-10-06 DIAGNOSIS — F321 Major depressive disorder, single episode, moderate: Secondary | ICD-10-CM | POA: Diagnosis not present

## 2018-10-06 DIAGNOSIS — R809 Proteinuria, unspecified: Secondary | ICD-10-CM | POA: Diagnosis not present

## 2018-10-06 DIAGNOSIS — N183 Chronic kidney disease, stage 3 (moderate): Secondary | ICD-10-CM | POA: Diagnosis not present

## 2018-10-06 DIAGNOSIS — D509 Iron deficiency anemia, unspecified: Secondary | ICD-10-CM | POA: Diagnosis not present

## 2018-10-06 DIAGNOSIS — Z79899 Other long term (current) drug therapy: Secondary | ICD-10-CM | POA: Diagnosis not present

## 2018-10-06 DIAGNOSIS — I10 Essential (primary) hypertension: Secondary | ICD-10-CM | POA: Diagnosis not present

## 2018-10-06 DIAGNOSIS — N184 Chronic kidney disease, stage 4 (severe): Secondary | ICD-10-CM | POA: Diagnosis not present

## 2018-10-06 DIAGNOSIS — E559 Vitamin D deficiency, unspecified: Secondary | ICD-10-CM | POA: Diagnosis not present

## 2018-10-07 DIAGNOSIS — Z466 Encounter for fitting and adjustment of urinary device: Secondary | ICD-10-CM | POA: Diagnosis not present

## 2018-10-07 DIAGNOSIS — N3001 Acute cystitis with hematuria: Secondary | ICD-10-CM | POA: Diagnosis not present

## 2018-10-07 DIAGNOSIS — Z436 Encounter for attention to other artificial openings of urinary tract: Secondary | ICD-10-CM | POA: Diagnosis not present

## 2018-10-07 DIAGNOSIS — I130 Hypertensive heart and chronic kidney disease with heart failure and stage 1 through stage 4 chronic kidney disease, or unspecified chronic kidney disease: Secondary | ICD-10-CM | POA: Diagnosis not present

## 2018-10-07 DIAGNOSIS — L89150 Pressure ulcer of sacral region, unstageable: Secondary | ICD-10-CM | POA: Diagnosis not present

## 2018-10-07 DIAGNOSIS — C61 Malignant neoplasm of prostate: Secondary | ICD-10-CM | POA: Diagnosis not present

## 2018-10-08 DIAGNOSIS — L89150 Pressure ulcer of sacral region, unstageable: Secondary | ICD-10-CM | POA: Diagnosis not present

## 2018-10-08 DIAGNOSIS — N3001 Acute cystitis with hematuria: Secondary | ICD-10-CM | POA: Diagnosis not present

## 2018-10-08 DIAGNOSIS — Z436 Encounter for attention to other artificial openings of urinary tract: Secondary | ICD-10-CM | POA: Diagnosis not present

## 2018-10-08 DIAGNOSIS — I130 Hypertensive heart and chronic kidney disease with heart failure and stage 1 through stage 4 chronic kidney disease, or unspecified chronic kidney disease: Secondary | ICD-10-CM | POA: Diagnosis not present

## 2018-10-08 DIAGNOSIS — Z466 Encounter for fitting and adjustment of urinary device: Secondary | ICD-10-CM | POA: Diagnosis not present

## 2018-10-08 DIAGNOSIS — C61 Malignant neoplasm of prostate: Secondary | ICD-10-CM | POA: Diagnosis not present

## 2018-10-09 DIAGNOSIS — C61 Malignant neoplasm of prostate: Secondary | ICD-10-CM | POA: Diagnosis not present

## 2018-10-09 DIAGNOSIS — L89150 Pressure ulcer of sacral region, unstageable: Secondary | ICD-10-CM | POA: Diagnosis not present

## 2018-10-09 DIAGNOSIS — Z436 Encounter for attention to other artificial openings of urinary tract: Secondary | ICD-10-CM | POA: Diagnosis not present

## 2018-10-09 DIAGNOSIS — N3001 Acute cystitis with hematuria: Secondary | ICD-10-CM | POA: Diagnosis not present

## 2018-10-09 DIAGNOSIS — I130 Hypertensive heart and chronic kidney disease with heart failure and stage 1 through stage 4 chronic kidney disease, or unspecified chronic kidney disease: Secondary | ICD-10-CM | POA: Diagnosis not present

## 2018-10-09 DIAGNOSIS — Z466 Encounter for fitting and adjustment of urinary device: Secondary | ICD-10-CM | POA: Diagnosis not present

## 2018-10-12 DIAGNOSIS — L89150 Pressure ulcer of sacral region, unstageable: Secondary | ICD-10-CM | POA: Diagnosis not present

## 2018-10-12 DIAGNOSIS — I130 Hypertensive heart and chronic kidney disease with heart failure and stage 1 through stage 4 chronic kidney disease, or unspecified chronic kidney disease: Secondary | ICD-10-CM | POA: Diagnosis not present

## 2018-10-12 DIAGNOSIS — Z436 Encounter for attention to other artificial openings of urinary tract: Secondary | ICD-10-CM | POA: Diagnosis not present

## 2018-10-12 DIAGNOSIS — N3001 Acute cystitis with hematuria: Secondary | ICD-10-CM | POA: Diagnosis not present

## 2018-10-12 DIAGNOSIS — Z466 Encounter for fitting and adjustment of urinary device: Secondary | ICD-10-CM | POA: Diagnosis not present

## 2018-10-12 DIAGNOSIS — C61 Malignant neoplasm of prostate: Secondary | ICD-10-CM | POA: Diagnosis not present

## 2018-10-13 DIAGNOSIS — N3001 Acute cystitis with hematuria: Secondary | ICD-10-CM | POA: Diagnosis not present

## 2018-10-13 DIAGNOSIS — E872 Acidosis: Secondary | ICD-10-CM | POA: Diagnosis not present

## 2018-10-13 DIAGNOSIS — Z466 Encounter for fitting and adjustment of urinary device: Secondary | ICD-10-CM | POA: Diagnosis not present

## 2018-10-13 DIAGNOSIS — Z436 Encounter for attention to other artificial openings of urinary tract: Secondary | ICD-10-CM | POA: Diagnosis not present

## 2018-10-13 DIAGNOSIS — I130 Hypertensive heart and chronic kidney disease with heart failure and stage 1 through stage 4 chronic kidney disease, or unspecified chronic kidney disease: Secondary | ICD-10-CM | POA: Diagnosis not present

## 2018-10-13 DIAGNOSIS — I10 Essential (primary) hypertension: Secondary | ICD-10-CM | POA: Diagnosis not present

## 2018-10-13 DIAGNOSIS — R809 Proteinuria, unspecified: Secondary | ICD-10-CM | POA: Diagnosis not present

## 2018-10-13 DIAGNOSIS — L89150 Pressure ulcer of sacral region, unstageable: Secondary | ICD-10-CM | POA: Diagnosis not present

## 2018-10-13 DIAGNOSIS — C61 Malignant neoplasm of prostate: Secondary | ICD-10-CM | POA: Diagnosis not present

## 2018-10-13 DIAGNOSIS — N184 Chronic kidney disease, stage 4 (severe): Secondary | ICD-10-CM | POA: Diagnosis not present

## 2018-10-15 DIAGNOSIS — I130 Hypertensive heart and chronic kidney disease with heart failure and stage 1 through stage 4 chronic kidney disease, or unspecified chronic kidney disease: Secondary | ICD-10-CM | POA: Diagnosis not present

## 2018-10-15 DIAGNOSIS — C61 Malignant neoplasm of prostate: Secondary | ICD-10-CM | POA: Diagnosis not present

## 2018-10-15 DIAGNOSIS — L89150 Pressure ulcer of sacral region, unstageable: Secondary | ICD-10-CM | POA: Diagnosis not present

## 2018-10-15 DIAGNOSIS — Z466 Encounter for fitting and adjustment of urinary device: Secondary | ICD-10-CM | POA: Diagnosis not present

## 2018-10-15 DIAGNOSIS — N3001 Acute cystitis with hematuria: Secondary | ICD-10-CM | POA: Diagnosis not present

## 2018-10-15 DIAGNOSIS — Z436 Encounter for attention to other artificial openings of urinary tract: Secondary | ICD-10-CM | POA: Diagnosis not present

## 2018-10-16 DIAGNOSIS — I130 Hypertensive heart and chronic kidney disease with heart failure and stage 1 through stage 4 chronic kidney disease, or unspecified chronic kidney disease: Secondary | ICD-10-CM | POA: Diagnosis not present

## 2018-10-16 DIAGNOSIS — Z436 Encounter for attention to other artificial openings of urinary tract: Secondary | ICD-10-CM | POA: Diagnosis not present

## 2018-10-16 DIAGNOSIS — Z466 Encounter for fitting and adjustment of urinary device: Secondary | ICD-10-CM | POA: Diagnosis not present

## 2018-10-16 DIAGNOSIS — C61 Malignant neoplasm of prostate: Secondary | ICD-10-CM | POA: Diagnosis not present

## 2018-10-16 DIAGNOSIS — L89150 Pressure ulcer of sacral region, unstageable: Secondary | ICD-10-CM | POA: Diagnosis not present

## 2018-10-16 DIAGNOSIS — N3001 Acute cystitis with hematuria: Secondary | ICD-10-CM | POA: Diagnosis not present

## 2018-10-20 DIAGNOSIS — Z466 Encounter for fitting and adjustment of urinary device: Secondary | ICD-10-CM | POA: Diagnosis not present

## 2018-10-20 DIAGNOSIS — Z436 Encounter for attention to other artificial openings of urinary tract: Secondary | ICD-10-CM | POA: Diagnosis not present

## 2018-10-20 DIAGNOSIS — C61 Malignant neoplasm of prostate: Secondary | ICD-10-CM | POA: Diagnosis not present

## 2018-10-20 DIAGNOSIS — I130 Hypertensive heart and chronic kidney disease with heart failure and stage 1 through stage 4 chronic kidney disease, or unspecified chronic kidney disease: Secondary | ICD-10-CM | POA: Diagnosis not present

## 2018-10-20 DIAGNOSIS — N3001 Acute cystitis with hematuria: Secondary | ICD-10-CM | POA: Diagnosis not present

## 2018-10-20 DIAGNOSIS — L89150 Pressure ulcer of sacral region, unstageable: Secondary | ICD-10-CM | POA: Diagnosis not present

## 2018-10-22 DIAGNOSIS — Z466 Encounter for fitting and adjustment of urinary device: Secondary | ICD-10-CM | POA: Diagnosis not present

## 2018-10-22 DIAGNOSIS — I130 Hypertensive heart and chronic kidney disease with heart failure and stage 1 through stage 4 chronic kidney disease, or unspecified chronic kidney disease: Secondary | ICD-10-CM | POA: Diagnosis not present

## 2018-10-22 DIAGNOSIS — C61 Malignant neoplasm of prostate: Secondary | ICD-10-CM | POA: Diagnosis not present

## 2018-10-22 DIAGNOSIS — N3001 Acute cystitis with hematuria: Secondary | ICD-10-CM | POA: Diagnosis not present

## 2018-10-22 DIAGNOSIS — L89150 Pressure ulcer of sacral region, unstageable: Secondary | ICD-10-CM | POA: Diagnosis not present

## 2018-10-22 DIAGNOSIS — Z436 Encounter for attention to other artificial openings of urinary tract: Secondary | ICD-10-CM | POA: Diagnosis not present

## 2018-10-23 DIAGNOSIS — L89150 Pressure ulcer of sacral region, unstageable: Secondary | ICD-10-CM | POA: Diagnosis not present

## 2018-10-23 DIAGNOSIS — N3001 Acute cystitis with hematuria: Secondary | ICD-10-CM | POA: Diagnosis not present

## 2018-10-23 DIAGNOSIS — I130 Hypertensive heart and chronic kidney disease with heart failure and stage 1 through stage 4 chronic kidney disease, or unspecified chronic kidney disease: Secondary | ICD-10-CM | POA: Diagnosis not present

## 2018-10-23 DIAGNOSIS — C61 Malignant neoplasm of prostate: Secondary | ICD-10-CM | POA: Diagnosis not present

## 2018-10-23 DIAGNOSIS — Z436 Encounter for attention to other artificial openings of urinary tract: Secondary | ICD-10-CM | POA: Diagnosis not present

## 2018-10-23 DIAGNOSIS — Z466 Encounter for fitting and adjustment of urinary device: Secondary | ICD-10-CM | POA: Diagnosis not present

## 2018-10-26 DIAGNOSIS — Z466 Encounter for fitting and adjustment of urinary device: Secondary | ICD-10-CM | POA: Diagnosis not present

## 2018-10-26 DIAGNOSIS — I130 Hypertensive heart and chronic kidney disease with heart failure and stage 1 through stage 4 chronic kidney disease, or unspecified chronic kidney disease: Secondary | ICD-10-CM | POA: Diagnosis not present

## 2018-10-26 DIAGNOSIS — C61 Malignant neoplasm of prostate: Secondary | ICD-10-CM | POA: Diagnosis not present

## 2018-10-26 DIAGNOSIS — L89150 Pressure ulcer of sacral region, unstageable: Secondary | ICD-10-CM | POA: Diagnosis not present

## 2018-10-26 DIAGNOSIS — Z436 Encounter for attention to other artificial openings of urinary tract: Secondary | ICD-10-CM | POA: Diagnosis not present

## 2018-10-26 DIAGNOSIS — N3001 Acute cystitis with hematuria: Secondary | ICD-10-CM | POA: Diagnosis not present

## 2018-10-27 DIAGNOSIS — I130 Hypertensive heart and chronic kidney disease with heart failure and stage 1 through stage 4 chronic kidney disease, or unspecified chronic kidney disease: Secondary | ICD-10-CM | POA: Diagnosis not present

## 2018-10-27 DIAGNOSIS — Z436 Encounter for attention to other artificial openings of urinary tract: Secondary | ICD-10-CM | POA: Diagnosis not present

## 2018-10-27 DIAGNOSIS — N3001 Acute cystitis with hematuria: Secondary | ICD-10-CM | POA: Diagnosis not present

## 2018-10-27 DIAGNOSIS — C61 Malignant neoplasm of prostate: Secondary | ICD-10-CM | POA: Diagnosis not present

## 2018-10-27 DIAGNOSIS — L89150 Pressure ulcer of sacral region, unstageable: Secondary | ICD-10-CM | POA: Diagnosis not present

## 2018-10-27 DIAGNOSIS — Z466 Encounter for fitting and adjustment of urinary device: Secondary | ICD-10-CM | POA: Diagnosis not present

## 2018-10-28 DIAGNOSIS — N3001 Acute cystitis with hematuria: Secondary | ICD-10-CM | POA: Diagnosis not present

## 2018-10-28 DIAGNOSIS — C61 Malignant neoplasm of prostate: Secondary | ICD-10-CM | POA: Diagnosis not present

## 2018-10-28 DIAGNOSIS — Z436 Encounter for attention to other artificial openings of urinary tract: Secondary | ICD-10-CM | POA: Diagnosis not present

## 2018-10-28 DIAGNOSIS — L89150 Pressure ulcer of sacral region, unstageable: Secondary | ICD-10-CM | POA: Diagnosis not present

## 2018-10-28 DIAGNOSIS — I130 Hypertensive heart and chronic kidney disease with heart failure and stage 1 through stage 4 chronic kidney disease, or unspecified chronic kidney disease: Secondary | ICD-10-CM | POA: Diagnosis not present

## 2018-10-28 DIAGNOSIS — Z466 Encounter for fitting and adjustment of urinary device: Secondary | ICD-10-CM | POA: Diagnosis not present

## 2018-10-30 ENCOUNTER — Emergency Department (HOSPITAL_COMMUNITY)
Admission: EM | Admit: 2018-10-30 | Discharge: 2018-10-30 | Disposition: A | Discharge status: Home / Self Care | Payer: MEDICARE | Attending: Emergency Medicine | Admitting: Emergency Medicine

## 2018-10-30 DIAGNOSIS — Z87891 Personal history of nicotine dependence: Secondary | ICD-10-CM | POA: Diagnosis not present

## 2018-10-30 DIAGNOSIS — I129 Hypertensive chronic kidney disease with stage 1 through stage 4 chronic kidney disease, or unspecified chronic kidney disease: Secondary | ICD-10-CM | POA: Insufficient documentation

## 2018-10-30 DIAGNOSIS — R319 Hematuria, unspecified: Secondary | ICD-10-CM | POA: Insufficient documentation

## 2018-10-30 DIAGNOSIS — N184 Chronic kidney disease, stage 4 (severe): Secondary | ICD-10-CM | POA: Diagnosis not present

## 2018-10-30 DIAGNOSIS — Z79899 Other long term (current) drug therapy: Secondary | ICD-10-CM | POA: Diagnosis not present

## 2018-10-30 DIAGNOSIS — R369 Urethral discharge, unspecified: Secondary | ICD-10-CM | POA: Diagnosis present

## 2018-10-30 DIAGNOSIS — I2510 Atherosclerotic heart disease of native coronary artery without angina pectoris: Secondary | ICD-10-CM | POA: Diagnosis not present

## 2018-10-30 LAB — BASIC METABOLIC PANEL
Anion gap: 7 (ref 5–15)
BUN: 31 mg/dL — ABNORMAL HIGH (ref 8–23)
CO2: 23 mmol/L (ref 22–32)
Calcium: 8.3 mg/dL — ABNORMAL LOW (ref 8.9–10.3)
Chloride: 103 mmol/L (ref 98–111)
Creatinine, Ser: 2.55 mg/dL — ABNORMAL HIGH (ref 0.61–1.24)
GFR calc Af Amer: 26 mL/min — ABNORMAL LOW (ref >60)
GFR calc non Af Amer: 23 mL/min — ABNORMAL LOW (ref >60)
Glucose, Bld: 101 mg/dL — ABNORMAL HIGH (ref 70–99)
Potassium: 4.1 mmol/L (ref 3.5–5.1)
Sodium: 133 mmol/L — ABNORMAL LOW (ref 135–145)

## 2018-10-30 LAB — CBC
HCT: 26 % — ABNORMAL LOW (ref 39.0–52.0)
Hemoglobin: 8.1 g/dL — ABNORMAL LOW (ref 13.0–17.0)
MCH: 28.6 pg (ref 26.0–34.0)
MCHC: 31.2 g/dL (ref 30.0–36.0)
MCV: 91.9 fL (ref 80.0–100.0)
Platelets: 186 10*3/uL (ref 150–400)
RBC: 2.83 MIL/uL — ABNORMAL LOW (ref 4.22–5.81)
RDW: 16.3 % — ABNORMAL HIGH (ref 11.5–15.5)
WBC: 6.8 10*3/uL (ref 4.0–10.5)
nRBC: 0 % (ref 0.0–0.2)

## 2018-10-30 NOTE — ED Notes (Signed)
EDP at bedside  

## 2018-10-30 NOTE — ED Triage Notes (Signed)
Pt reports bloody penile discharge for last several months and reports several admissions for same in the past. Pt reports bilateral nephrostomy tubes placed several months ago as well. Pt denies fever, chills.

## 2018-10-30 NOTE — Discharge Instructions (Signed)
I have spoken with your doctors at [facility].  They have recommended that they will see you in the office within 2 to 4 weeks and will call you to schedule this appointment.  They do not want any further treatment this evening as the bleeding will likely stop by itself.  They have recommended that if you continue to have bleeding or if it worsens you should go to [facility].  They also expect that he will likely have some bleeding for the next couple of days.  Be aware that you are not anemic today, you are at your baseline for blood counts.

## 2018-10-30 NOTE — ED Provider Notes (Signed)
[facility] EMERGENCY DEPARTMENT Provider Note   CSN: [id number] Arrival date & time: 10/30/18  1139     History   Chief Complaint Chief Complaint  Patient presents with  . Penile Discharge    HPI [name] [name] is a 84 y.o. male.  HPI  The patient is an 84 year old male status post prostatectomy in 1999, artificial sphincter placement as the patient was unable to have a Foley catheter placed, he has a known history of chronic hematuria and blood clots which have caused a chronic urinary obstruction and incontinence, because of this he has had bilateral nephrostomy tubes placed which she has had for many years.  The patient has not had any difficulty with this in the last couple of weeks but in the month November was seen on the 12th when he was seen in the office of the nephrologist because of his chronic kidney disease for routine follow-up.  It was noted that in August he had evaluation for urinary retention and hematuria and required removal of an artificial urinary sphincter, he had cystourethroscopy and bladder clot evacuation followed by placement of bilateral nephrostomy tubes at [facility].  Since that time the patient has had evaluation for urinary tract infection and in fact is been admitted to the hospital for infection control.  He states that overnight he started to have increasing bleeding and clot from his penis, he reports that he has no flank pain abdominal pain fevers chills nausea vomiting shortness of breath or any other complaints.  He has been filling up his diaper with some blood.  The patient denies any pain in the penis, states that he is chronically mildly incontinent and has no control since his urinary sphincter was removed.  He is accompanied by the nurse assistant who helps take care of him, reports that he appears to be his normal self as well.  Past Medical History:  Diagnosis Date  . Artificial opening care, other    pt states that he has  artifical sphincter, unable to have foley cath placed.   . Cancer ([place])    prostate ca 1999/removal/ rad tx  . Carotid artery occlusion   . Hyperlipidemia   . Hypertension   . Incontinence   . Reflux   . Renal disorder   . Status post implantation of artificial urinary sphincter     Patient Active Problem List   Diagnosis Date Noted  . Syncope 09/07/2018  . Acute lower UTI 09/07/2018  . ARF (acute renal failure) ([place]) 09/07/2018  . Gross hematuria 07/25/2018  . Sepsis due to urinary tract infection ([place]) 07/19/2018  . Paroxysmal A-fib ([place]) 07/19/2018  . Chronic kidney disease, stage IV (severe) ([place]) 07/19/2018  . Hyperlipidemia 08/05/2014  . Knee effusion, right 04/21/2012  . Essential hypertension 07/25/2011  . Anemia 08/27/2010  . CAROTID ARTERY DISEASE 06/14/2010  . CLOSED FRACTURE OF ACROMIAL END OF CLAVICLE 04/25/2010    Past Surgical History:  Procedure Laterality Date  . cataracts    . HERNIA REPAIR    . NEPHROSTOMY    . radical prostectomy    . URINARY SPHINCTER IMPLANT          Home Medications    Prior to Admission medications   Medication Sig Start Date End Date Taking? Authorizing Provider  acetaminophen (TYLENOL) 500 MG tablet Take 1,000 mg by mouth every 8 (eight) hours as needed for mild pain or moderate pain. For headache pain    Yes [provider]  ALFALFA  PO Take 250 mg by mouth 2 (two) times daily.    Yes [provider]  allopurinol (ZYLOPRIM) 100 MG tablet TK 1 T PO ONCE D 10/07/18  Yes [provider]  ALPRAZolam (XANAX) 0.5 MG tablet TK 1 T PO  BID 10/08/18  Yes [provider]  amLODipine (NORVASC) 5 MG tablet Take 5 mg by mouth daily.   Yes [provider]  fish oil-omega-3 fatty acids 1000 MG capsule Take 1 g by mouth daily.    Yes [provider]  lansoprazole (PREVACID) 15 MG capsule  08/24/10  Yes [provider]  meclizine (ANTIVERT) 12.5 MG tablet Take 12.5 mg by mouth  daily.   Yes [provider]  Multiple Vitamin (MULTIVITAMIN WITH MINERALS) TABS tablet Take 1 tablet by mouth daily.   Yes [provider]  ofloxacin (FLOXIN) 0.3 % OTIC solution PUT 5 DROPS TWICE DAILY INTO AFFECTED EAR 10/19/18  Yes [provider]  Probiotic Product (RISA-BID PROBIOTIC) TABS Give 1 tablet by mouth twice a day from 09/14/2018-09/24/2018   Yes [provider]  rosuvastatin (CRESTOR) 10 MG tablet Take 10 mg by mouth daily.    Yes [provider]  sertraline (ZOLOFT) 100 MG tablet Take 100 mg by mouth daily.   Yes [provider]  sodium bicarbonate 650 MG tablet TK 1 T PO  TID 10/13/18  Yes [provider]  ceFEPIme 1 g in sodium chloride 0.9 % 100 mL Inject 1 g into the vein daily. 09/10/18   [name], MD    Family History Family History  Problem Relation Age of Onset  . Coronary artery disease Unknown        family history, male < 92  . Arthritis Unknown        family history  . Cancer Unknown   . Kidney disease Unknown     Social History Social History   Tobacco Use  . Smoking status: Former Smoker    Packs/day: 2.00    Types: Cigarettes    Start date: 08/05/1962    Last attempt to quit: 12/02/1976    Years since quitting: 41.9  . Smokeless tobacco: Never Used  Substance Use Topics  . Alcohol use: No  . Drug use: No     Allergies   Ivp dye [iodinated diagnostic agents]; Nalbuphine; and Codeine   Review of Systems Review of Systems  All other systems reviewed and are negative.    Physical Exam Updated Vital Signs BP (!) 154/62 (BP Location: Right Arm)   Pulse 91   Temp 98.4 F (36.9 C) (Oral)   Resp 20   Ht 1.727 m (5\' 8" )   Wt 73.5 kg   SpO2 99%   BMI 24.63 kg/m   Physical Exam  Constitutional: He appears well-developed and well-nourished. No distress.  HENT:  Head: Normocephalic and atraumatic.  Mouth/Throat: Oropharynx is clear and moist. No oropharyngeal exudate.    Eyes: Pupils are equal, round, and reactive to light. Conjunctivae and EOM are normal. Right eye exhibits no discharge. Left eye exhibits no discharge. No scleral icterus.  Neck: Normal range of motion. Neck supple. No JVD present. No thyromegaly present.  Cardiovascular: Normal rate, regular rhythm, normal heart sounds and intact distal pulses. Exam reveals no gallop and no friction rub.  No murmur heard. Pulmonary/Chest: Effort normal and breath sounds normal. No respiratory distress. He has no wheezes. He has no rales.  Abdominal: Soft. Bowel sounds are normal. He exhibits no distension  and no mass. There is no tenderness.  Genitourinary:  Genitourinary Comments: Normal-appearing penis scrotum and testicles, he is uncircumcised, the urethral meatus has blood at the tip, the diaper that he is wearing a soaked in blood  Musculoskeletal: Normal range of motion. He exhibits no edema or tenderness.  Lymphadenopathy:    He has no cervical adenopathy.  Neurological: He is alert. Coordination normal.  Skin: Skin is warm and dry. No rash noted. No erythema.  Psychiatric: He has a normal mood and affect. His behavior is normal.  Nursing note and vitals reviewed.    ED Treatments / Results  Labs (all labs ordered are listed, but only abnormal results are displayed) Labs Reviewed  CBC - Abnormal; Notable for the following components:      Result Value   RBC 2.83 (*)    Hemoglobin 8.1 (*)    HCT 26.0 (*)    RDW 16.3 (*)    All other components within normal limits  BASIC METABOLIC PANEL - Abnormal; Notable for the following components:   Sodium 133 (*)    Glucose, Bld 101 (*)    BUN 31 (*)    Creatinine, Ser 2.55 (*)    Calcium 8.3 (*)    GFR calc non Af Amer 23 (*)    GFR calc Af Amer 26 (*)    All other components within normal limits    EKG None  Radiology No results found.  Procedures Procedures (including critical care time)  Medications Ordered in ED Medications - No  data to display   Initial Impression / Assessment and Plan / ED Course  I have reviewed the triage vital signs and the nursing notes.  Pertinent labs & imaging results that were available during my care of the patient were reviewed by me and considered in my medical decision making (see chart for details).  Clinical Course as of Oct 30 1658  Fri Oct 30, 2018  1600 No leukocytosis, hemoglobin is 8.1 which is consistent with prior levels   [BM]  1626 Creatinine is at baseline, sodium is minimally low   [BM]  1657 I discussed the case with Dr. Charna Archer at [facility] urology.  They have recommended a close follow-up and will arrange for this in the office.  They recommend that there is nothing else that needs to be done as long as the patient is not febrile or has any other sources of infection.  They recommend against doing a urinalysis at this time, they recommend that the patient does not need to be transfused unless he is anemic which she is not, and at this time the patient is stable appearing for discharge.  I will encourage the family to return to [facility] should the bleeding increase.   [BM]    Clinical Course User Index [BM] Noemi Chapel, MD   The patient is not ill-appearing, he has a small amount of blood at the tip of the urethra, he has some blood in the diaper but is not actively hemorrhaging has no abdominal distention or tenderness and his nephrostomy tubes contain yellow urine.  At this time the patient will have his renal function rechecked, we will discuss with urology regarding his recurrent blood clots and hematuria.  He does not appear in distress and does not appear septic.  He is not febrile tachycardic or hypotensive.  Final Clinical Impressions(s) / ED Diagnoses   Final diagnoses:  Hematuria, unspecified type      Noemi Chapel, MD  10/30/18 1659  

## 2018-10-30 NOTE — ED Notes (Signed)
Phlebotomy at bedside.

## 2018-11-02 DIAGNOSIS — R310 Gross hematuria: Secondary | ICD-10-CM | POA: Diagnosis not present

## 2018-11-02 DIAGNOSIS — N320 Bladder-neck obstruction: Secondary | ICD-10-CM | POA: Diagnosis not present

## 2018-11-02 DIAGNOSIS — Z466 Encounter for fitting and adjustment of urinary device: Secondary | ICD-10-CM | POA: Diagnosis not present

## 2018-11-03 DIAGNOSIS — I130 Hypertensive heart and chronic kidney disease with heart failure and stage 1 through stage 4 chronic kidney disease, or unspecified chronic kidney disease: Secondary | ICD-10-CM | POA: Diagnosis not present

## 2018-11-03 DIAGNOSIS — C61 Malignant neoplasm of prostate: Secondary | ICD-10-CM | POA: Diagnosis not present

## 2018-11-03 DIAGNOSIS — L89150 Pressure ulcer of sacral region, unstageable: Secondary | ICD-10-CM | POA: Diagnosis not present

## 2018-11-03 DIAGNOSIS — Z436 Encounter for attention to other artificial openings of urinary tract: Secondary | ICD-10-CM | POA: Diagnosis not present

## 2018-11-03 DIAGNOSIS — Z466 Encounter for fitting and adjustment of urinary device: Secondary | ICD-10-CM | POA: Diagnosis not present

## 2018-11-03 DIAGNOSIS — N3001 Acute cystitis with hematuria: Secondary | ICD-10-CM | POA: Diagnosis not present

## 2018-11-04 DIAGNOSIS — I130 Hypertensive heart and chronic kidney disease with heart failure and stage 1 through stage 4 chronic kidney disease, or unspecified chronic kidney disease: Secondary | ICD-10-CM | POA: Diagnosis not present

## 2018-11-04 DIAGNOSIS — Z436 Encounter for attention to other artificial openings of urinary tract: Secondary | ICD-10-CM | POA: Diagnosis not present

## 2018-11-04 DIAGNOSIS — N3001 Acute cystitis with hematuria: Secondary | ICD-10-CM | POA: Diagnosis not present

## 2018-11-04 DIAGNOSIS — L89150 Pressure ulcer of sacral region, unstageable: Secondary | ICD-10-CM | POA: Diagnosis not present

## 2018-11-04 DIAGNOSIS — Z466 Encounter for fitting and adjustment of urinary device: Secondary | ICD-10-CM | POA: Diagnosis not present

## 2018-11-04 DIAGNOSIS — C61 Malignant neoplasm of prostate: Secondary | ICD-10-CM | POA: Diagnosis not present

## 2018-11-05 DIAGNOSIS — I480 Paroxysmal atrial fibrillation: Secondary | ICD-10-CM | POA: Diagnosis not present

## 2018-11-05 DIAGNOSIS — Z8546 Personal history of malignant neoplasm of prostate: Secondary | ICD-10-CM | POA: Diagnosis not present

## 2018-11-05 DIAGNOSIS — E440 Moderate protein-calorie malnutrition: Secondary | ICD-10-CM | POA: Diagnosis not present

## 2018-11-05 DIAGNOSIS — K219 Gastro-esophageal reflux disease without esophagitis: Secondary | ICD-10-CM | POA: Diagnosis not present

## 2018-11-05 DIAGNOSIS — D631 Anemia in chronic kidney disease: Secondary | ICD-10-CM | POA: Diagnosis not present

## 2018-11-05 DIAGNOSIS — Z436 Encounter for attention to other artificial openings of urinary tract: Secondary | ICD-10-CM | POA: Diagnosis not present

## 2018-11-05 DIAGNOSIS — Z23 Encounter for immunization: Secondary | ICD-10-CM | POA: Diagnosis not present

## 2018-11-05 DIAGNOSIS — I130 Hypertensive heart and chronic kidney disease with heart failure and stage 1 through stage 4 chronic kidney disease, or unspecified chronic kidney disease: Secondary | ICD-10-CM | POA: Diagnosis not present

## 2018-11-05 DIAGNOSIS — F329 Major depressive disorder, single episode, unspecified: Secondary | ICD-10-CM | POA: Diagnosis not present

## 2018-11-05 DIAGNOSIS — I2510 Atherosclerotic heart disease of native coronary artery without angina pectoris: Secondary | ICD-10-CM | POA: Diagnosis not present

## 2018-11-05 DIAGNOSIS — G629 Polyneuropathy, unspecified: Secondary | ICD-10-CM | POA: Diagnosis not present

## 2018-11-05 DIAGNOSIS — N12 Tubulo-interstitial nephritis, not specified as acute or chronic: Secondary | ICD-10-CM | POA: Diagnosis not present

## 2018-11-05 DIAGNOSIS — I5030 Unspecified diastolic (congestive) heart failure: Secondary | ICD-10-CM | POA: Diagnosis not present

## 2018-11-05 DIAGNOSIS — N184 Chronic kidney disease, stage 4 (severe): Secondary | ICD-10-CM | POA: Diagnosis not present

## 2018-11-05 DIAGNOSIS — F321 Major depressive disorder, single episode, moderate: Secondary | ICD-10-CM | POA: Diagnosis not present

## 2018-11-06 DIAGNOSIS — N184 Chronic kidney disease, stage 4 (severe): Secondary | ICD-10-CM | POA: Diagnosis not present

## 2018-11-06 DIAGNOSIS — N12 Tubulo-interstitial nephritis, not specified as acute or chronic: Secondary | ICD-10-CM | POA: Diagnosis not present

## 2018-11-06 DIAGNOSIS — I130 Hypertensive heart and chronic kidney disease with heart failure and stage 1 through stage 4 chronic kidney disease, or unspecified chronic kidney disease: Secondary | ICD-10-CM | POA: Diagnosis not present

## 2018-11-06 DIAGNOSIS — I5030 Unspecified diastolic (congestive) heart failure: Secondary | ICD-10-CM | POA: Diagnosis not present

## 2018-11-06 DIAGNOSIS — Z436 Encounter for attention to other artificial openings of urinary tract: Secondary | ICD-10-CM | POA: Diagnosis not present

## 2018-11-06 DIAGNOSIS — I480 Paroxysmal atrial fibrillation: Secondary | ICD-10-CM | POA: Diagnosis not present

## 2018-11-09 DIAGNOSIS — Z436 Encounter for attention to other artificial openings of urinary tract: Secondary | ICD-10-CM | POA: Diagnosis not present

## 2018-11-09 DIAGNOSIS — I480 Paroxysmal atrial fibrillation: Secondary | ICD-10-CM | POA: Diagnosis not present

## 2018-11-09 DIAGNOSIS — N184 Chronic kidney disease, stage 4 (severe): Secondary | ICD-10-CM | POA: Diagnosis not present

## 2018-11-09 DIAGNOSIS — N12 Tubulo-interstitial nephritis, not specified as acute or chronic: Secondary | ICD-10-CM | POA: Diagnosis not present

## 2018-11-09 DIAGNOSIS — I130 Hypertensive heart and chronic kidney disease with heart failure and stage 1 through stage 4 chronic kidney disease, or unspecified chronic kidney disease: Secondary | ICD-10-CM | POA: Diagnosis not present

## 2018-11-09 DIAGNOSIS — I5030 Unspecified diastolic (congestive) heart failure: Secondary | ICD-10-CM | POA: Diagnosis not present

## 2018-11-10 DIAGNOSIS — I130 Hypertensive heart and chronic kidney disease with heart failure and stage 1 through stage 4 chronic kidney disease, or unspecified chronic kidney disease: Secondary | ICD-10-CM | POA: Diagnosis not present

## 2018-11-10 DIAGNOSIS — I480 Paroxysmal atrial fibrillation: Secondary | ICD-10-CM | POA: Diagnosis not present

## 2018-11-10 DIAGNOSIS — Z436 Encounter for attention to other artificial openings of urinary tract: Secondary | ICD-10-CM | POA: Diagnosis not present

## 2018-11-10 DIAGNOSIS — N184 Chronic kidney disease, stage 4 (severe): Secondary | ICD-10-CM | POA: Diagnosis not present

## 2018-11-10 DIAGNOSIS — I5030 Unspecified diastolic (congestive) heart failure: Secondary | ICD-10-CM | POA: Diagnosis not present

## 2018-11-10 DIAGNOSIS — N12 Tubulo-interstitial nephritis, not specified as acute or chronic: Secondary | ICD-10-CM | POA: Diagnosis not present

## 2018-11-12 DIAGNOSIS — N184 Chronic kidney disease, stage 4 (severe): Secondary | ICD-10-CM | POA: Diagnosis not present

## 2018-11-12 DIAGNOSIS — I480 Paroxysmal atrial fibrillation: Secondary | ICD-10-CM | POA: Diagnosis not present

## 2018-11-12 DIAGNOSIS — N12 Tubulo-interstitial nephritis, not specified as acute or chronic: Secondary | ICD-10-CM | POA: Diagnosis not present

## 2018-11-12 DIAGNOSIS — I5030 Unspecified diastolic (congestive) heart failure: Secondary | ICD-10-CM | POA: Diagnosis not present

## 2018-11-12 DIAGNOSIS — I130 Hypertensive heart and chronic kidney disease with heart failure and stage 1 through stage 4 chronic kidney disease, or unspecified chronic kidney disease: Secondary | ICD-10-CM | POA: Diagnosis not present

## 2018-11-12 DIAGNOSIS — Z436 Encounter for attention to other artificial openings of urinary tract: Secondary | ICD-10-CM | POA: Diagnosis not present

## 2018-11-16 DIAGNOSIS — Z436 Encounter for attention to other artificial openings of urinary tract: Secondary | ICD-10-CM | POA: Diagnosis not present

## 2018-11-16 DIAGNOSIS — I5030 Unspecified diastolic (congestive) heart failure: Secondary | ICD-10-CM | POA: Diagnosis not present

## 2018-11-16 DIAGNOSIS — I130 Hypertensive heart and chronic kidney disease with heart failure and stage 1 through stage 4 chronic kidney disease, or unspecified chronic kidney disease: Secondary | ICD-10-CM | POA: Diagnosis not present

## 2018-11-16 DIAGNOSIS — N184 Chronic kidney disease, stage 4 (severe): Secondary | ICD-10-CM | POA: Diagnosis not present

## 2018-11-16 DIAGNOSIS — I480 Paroxysmal atrial fibrillation: Secondary | ICD-10-CM | POA: Diagnosis not present

## 2018-11-16 DIAGNOSIS — N12 Tubulo-interstitial nephritis, not specified as acute or chronic: Secondary | ICD-10-CM | POA: Diagnosis not present

## 2018-11-19 DIAGNOSIS — N184 Chronic kidney disease, stage 4 (severe): Secondary | ICD-10-CM | POA: Diagnosis not present

## 2018-11-19 DIAGNOSIS — I5030 Unspecified diastolic (congestive) heart failure: Secondary | ICD-10-CM | POA: Diagnosis not present

## 2018-11-19 DIAGNOSIS — I130 Hypertensive heart and chronic kidney disease with heart failure and stage 1 through stage 4 chronic kidney disease, or unspecified chronic kidney disease: Secondary | ICD-10-CM | POA: Diagnosis not present

## 2018-11-19 DIAGNOSIS — Z436 Encounter for attention to other artificial openings of urinary tract: Secondary | ICD-10-CM | POA: Diagnosis not present

## 2018-11-19 DIAGNOSIS — I480 Paroxysmal atrial fibrillation: Secondary | ICD-10-CM | POA: Diagnosis not present

## 2018-11-19 DIAGNOSIS — N12 Tubulo-interstitial nephritis, not specified as acute or chronic: Secondary | ICD-10-CM | POA: Diagnosis not present

## 2018-11-23 DIAGNOSIS — R339 Retention of urine, unspecified: Secondary | ICD-10-CM | POA: Diagnosis not present

## 2018-11-23 DIAGNOSIS — N179 Acute kidney failure, unspecified: Secondary | ICD-10-CM | POA: Diagnosis not present

## 2018-11-23 DIAGNOSIS — R310 Gross hematuria: Secondary | ICD-10-CM | POA: Diagnosis not present

## 2018-11-23 DIAGNOSIS — Z960 Presence of urogenital implants: Secondary | ICD-10-CM | POA: Diagnosis not present

## 2018-11-23 DIAGNOSIS — N3040 Irradiation cystitis without hematuria: Secondary | ICD-10-CM | POA: Diagnosis not present

## 2018-11-23 DIAGNOSIS — Z8546 Personal history of malignant neoplasm of prostate: Secondary | ICD-10-CM | POA: Diagnosis not present

## 2018-11-23 DIAGNOSIS — N481 Balanitis: Secondary | ICD-10-CM | POA: Diagnosis not present

## 2018-11-23 DIAGNOSIS — N393 Stress incontinence (female) (male): Secondary | ICD-10-CM | POA: Diagnosis not present

## 2018-11-27 DIAGNOSIS — Z436 Encounter for attention to other artificial openings of urinary tract: Secondary | ICD-10-CM | POA: Diagnosis not present

## 2018-11-27 DIAGNOSIS — I130 Hypertensive heart and chronic kidney disease with heart failure and stage 1 through stage 4 chronic kidney disease, or unspecified chronic kidney disease: Secondary | ICD-10-CM | POA: Diagnosis not present

## 2018-11-27 DIAGNOSIS — N184 Chronic kidney disease, stage 4 (severe): Secondary | ICD-10-CM | POA: Diagnosis not present

## 2018-11-27 DIAGNOSIS — I5030 Unspecified diastolic (congestive) heart failure: Secondary | ICD-10-CM | POA: Diagnosis not present

## 2018-11-27 DIAGNOSIS — I480 Paroxysmal atrial fibrillation: Secondary | ICD-10-CM | POA: Diagnosis not present

## 2018-11-27 DIAGNOSIS — N12 Tubulo-interstitial nephritis, not specified as acute or chronic: Secondary | ICD-10-CM | POA: Diagnosis not present

## 2018-11-30 DIAGNOSIS — N184 Chronic kidney disease, stage 4 (severe): Secondary | ICD-10-CM | POA: Diagnosis not present

## 2018-11-30 DIAGNOSIS — N12 Tubulo-interstitial nephritis, not specified as acute or chronic: Secondary | ICD-10-CM | POA: Diagnosis not present

## 2018-11-30 DIAGNOSIS — I480 Paroxysmal atrial fibrillation: Secondary | ICD-10-CM | POA: Diagnosis not present

## 2018-11-30 DIAGNOSIS — I5030 Unspecified diastolic (congestive) heart failure: Secondary | ICD-10-CM | POA: Diagnosis not present

## 2018-11-30 DIAGNOSIS — Z436 Encounter for attention to other artificial openings of urinary tract: Secondary | ICD-10-CM | POA: Diagnosis not present

## 2018-11-30 DIAGNOSIS — I130 Hypertensive heart and chronic kidney disease with heart failure and stage 1 through stage 4 chronic kidney disease, or unspecified chronic kidney disease: Secondary | ICD-10-CM | POA: Diagnosis not present

## 2018-12-28 DIAGNOSIS — N289 Disorder of kidney and ureter, unspecified: Secondary | ICD-10-CM | POA: Diagnosis not present

## 2018-12-28 DIAGNOSIS — Z436 Encounter for attention to other artificial openings of urinary tract: Secondary | ICD-10-CM | POA: Diagnosis not present

## 2019-01-07 DIAGNOSIS — N184 Chronic kidney disease, stage 4 (severe): Secondary | ICD-10-CM | POA: Diagnosis not present

## 2019-01-07 DIAGNOSIS — I2510 Atherosclerotic heart disease of native coronary artery without angina pectoris: Secondary | ICD-10-CM | POA: Diagnosis not present

## 2019-01-07 DIAGNOSIS — I10 Essential (primary) hypertension: Secondary | ICD-10-CM | POA: Diagnosis not present

## 2019-01-20 DIAGNOSIS — R809 Proteinuria, unspecified: Secondary | ICD-10-CM | POA: Diagnosis not present

## 2019-01-20 DIAGNOSIS — N183 Chronic kidney disease, stage 3 (moderate): Secondary | ICD-10-CM | POA: Diagnosis not present

## 2019-01-20 DIAGNOSIS — I10 Essential (primary) hypertension: Secondary | ICD-10-CM | POA: Diagnosis not present

## 2019-01-20 DIAGNOSIS — Z79899 Other long term (current) drug therapy: Secondary | ICD-10-CM | POA: Diagnosis not present

## 2019-01-20 DIAGNOSIS — E559 Vitamin D deficiency, unspecified: Secondary | ICD-10-CM | POA: Diagnosis not present

## 2019-01-20 DIAGNOSIS — D509 Iron deficiency anemia, unspecified: Secondary | ICD-10-CM | POA: Diagnosis not present

## 2019-01-26 DIAGNOSIS — I10 Essential (primary) hypertension: Secondary | ICD-10-CM | POA: Diagnosis not present

## 2019-01-26 DIAGNOSIS — D638 Anemia in other chronic diseases classified elsewhere: Secondary | ICD-10-CM | POA: Diagnosis not present

## 2019-01-26 DIAGNOSIS — N184 Chronic kidney disease, stage 4 (severe): Secondary | ICD-10-CM | POA: Diagnosis not present

## 2019-01-26 DIAGNOSIS — R809 Proteinuria, unspecified: Secondary | ICD-10-CM | POA: Diagnosis not present

## 2019-01-29 DIAGNOSIS — Z8546 Personal history of malignant neoplasm of prostate: Secondary | ICD-10-CM | POA: Diagnosis not present

## 2019-01-29 DIAGNOSIS — N3040 Irradiation cystitis without hematuria: Secondary | ICD-10-CM | POA: Insufficient documentation

## 2019-01-29 DIAGNOSIS — N393 Stress incontinence (female) (male): Secondary | ICD-10-CM | POA: Diagnosis not present

## 2019-01-29 DIAGNOSIS — Z9079 Acquired absence of other genital organ(s): Secondary | ICD-10-CM | POA: Diagnosis not present

## 2019-01-29 DIAGNOSIS — N184 Chronic kidney disease, stage 4 (severe): Secondary | ICD-10-CM | POA: Diagnosis not present

## 2019-02-08 ENCOUNTER — Encounter (INDEPENDENT_AMBULATORY_CARE_PROVIDER_SITE_OTHER): Payer: Self-pay | Admitting: Ophthalmology

## 2019-02-15 DIAGNOSIS — I2510 Atherosclerotic heart disease of native coronary artery without angina pectoris: Secondary | ICD-10-CM | POA: Diagnosis not present

## 2019-02-15 DIAGNOSIS — N184 Chronic kidney disease, stage 4 (severe): Secondary | ICD-10-CM | POA: Diagnosis not present

## 2019-02-15 DIAGNOSIS — J209 Acute bronchitis, unspecified: Secondary | ICD-10-CM | POA: Diagnosis not present

## 2019-02-15 DIAGNOSIS — I129 Hypertensive chronic kidney disease with stage 1 through stage 4 chronic kidney disease, or unspecified chronic kidney disease: Secondary | ICD-10-CM | POA: Diagnosis not present

## 2019-02-22 DIAGNOSIS — R310 Gross hematuria: Secondary | ICD-10-CM | POA: Diagnosis not present

## 2019-02-22 DIAGNOSIS — Z466 Encounter for fitting and adjustment of urinary device: Secondary | ICD-10-CM | POA: Diagnosis not present

## 2019-03-29 DIAGNOSIS — I2510 Atherosclerotic heart disease of native coronary artery without angina pectoris: Secondary | ICD-10-CM | POA: Diagnosis not present

## 2019-03-29 DIAGNOSIS — R609 Edema, unspecified: Secondary | ICD-10-CM | POA: Diagnosis not present

## 2019-03-29 DIAGNOSIS — N184 Chronic kidney disease, stage 4 (severe): Secondary | ICD-10-CM | POA: Diagnosis not present

## 2019-03-29 DIAGNOSIS — F419 Anxiety disorder, unspecified: Secondary | ICD-10-CM | POA: Diagnosis not present

## 2019-05-26 DIAGNOSIS — I10 Essential (primary) hypertension: Secondary | ICD-10-CM | POA: Diagnosis not present

## 2019-05-26 DIAGNOSIS — N184 Chronic kidney disease, stage 4 (severe): Secondary | ICD-10-CM | POA: Diagnosis not present

## 2019-05-26 DIAGNOSIS — I2510 Atherosclerotic heart disease of native coronary artery without angina pectoris: Secondary | ICD-10-CM | POA: Diagnosis not present

## 2019-05-26 DIAGNOSIS — F321 Major depressive disorder, single episode, moderate: Secondary | ICD-10-CM | POA: Diagnosis not present

## 2019-06-08 DIAGNOSIS — E559 Vitamin D deficiency, unspecified: Secondary | ICD-10-CM | POA: Diagnosis not present

## 2019-06-08 DIAGNOSIS — Z79899 Other long term (current) drug therapy: Secondary | ICD-10-CM | POA: Diagnosis not present

## 2019-06-08 DIAGNOSIS — R809 Proteinuria, unspecified: Secondary | ICD-10-CM | POA: Diagnosis not present

## 2019-06-08 DIAGNOSIS — N184 Chronic kidney disease, stage 4 (severe): Secondary | ICD-10-CM | POA: Diagnosis not present

## 2019-06-08 DIAGNOSIS — I10 Essential (primary) hypertension: Secondary | ICD-10-CM | POA: Diagnosis not present

## 2019-06-08 DIAGNOSIS — D649 Anemia, unspecified: Secondary | ICD-10-CM | POA: Diagnosis not present

## 2019-06-30 ENCOUNTER — Other Ambulatory Visit: Payer: Self-pay

## 2019-07-16 DIAGNOSIS — R809 Proteinuria, unspecified: Secondary | ICD-10-CM | POA: Diagnosis not present

## 2019-08-10 DIAGNOSIS — Z23 Encounter for immunization: Secondary | ICD-10-CM | POA: Diagnosis not present

## 2019-08-10 DIAGNOSIS — Z0000 Encounter for general adult medical examination without abnormal findings: Secondary | ICD-10-CM | POA: Diagnosis not present

## 2019-08-20 DIAGNOSIS — I82401 Acute embolism and thrombosis of unspecified deep veins of right lower extremity: Secondary | ICD-10-CM | POA: Diagnosis not present

## 2019-08-20 DIAGNOSIS — R609 Edema, unspecified: Secondary | ICD-10-CM | POA: Diagnosis not present

## 2019-08-20 DIAGNOSIS — Z125 Encounter for screening for malignant neoplasm of prostate: Secondary | ICD-10-CM | POA: Diagnosis not present

## 2019-08-20 DIAGNOSIS — E785 Hyperlipidemia, unspecified: Secondary | ICD-10-CM | POA: Diagnosis not present

## 2019-08-20 DIAGNOSIS — I130 Hypertensive heart and chronic kidney disease with heart failure and stage 1 through stage 4 chronic kidney disease, or unspecified chronic kidney disease: Secondary | ICD-10-CM | POA: Diagnosis not present

## 2019-08-20 DIAGNOSIS — N184 Chronic kidney disease, stage 4 (severe): Secondary | ICD-10-CM | POA: Diagnosis not present

## 2019-08-20 DIAGNOSIS — I10 Essential (primary) hypertension: Secondary | ICD-10-CM | POA: Diagnosis not present

## 2019-08-26 ENCOUNTER — Ambulatory Visit (HOSPITAL_COMMUNITY)
Admission: RE | Admit: 2019-08-26 | Discharge: 2019-08-26 | Disposition: A | Discharge status: Home / Self Care | Payer: MEDICARE | Source: Ambulatory Visit | Attending: Pulmonary Disease | Admitting: Pulmonary Disease

## 2019-08-26 ENCOUNTER — Other Ambulatory Visit: Payer: Self-pay | Admitting: Pulmonary Disease

## 2019-08-26 ENCOUNTER — Other Ambulatory Visit: Payer: Self-pay

## 2019-08-26 ENCOUNTER — Other Ambulatory Visit (HOSPITAL_COMMUNITY): Payer: Self-pay | Admitting: Pulmonary Disease

## 2019-08-26 DIAGNOSIS — M79605 Pain in left leg: Secondary | ICD-10-CM | POA: Diagnosis not present

## 2019-08-26 DIAGNOSIS — M79662 Pain in left lower leg: Secondary | ICD-10-CM

## 2019-10-18 DIAGNOSIS — Z79899 Other long term (current) drug therapy: Secondary | ICD-10-CM | POA: Diagnosis not present

## 2019-10-18 DIAGNOSIS — D631 Anemia in chronic kidney disease: Secondary | ICD-10-CM | POA: Diagnosis not present

## 2019-10-18 DIAGNOSIS — E559 Vitamin D deficiency, unspecified: Secondary | ICD-10-CM | POA: Diagnosis not present

## 2019-10-18 DIAGNOSIS — R809 Proteinuria, unspecified: Secondary | ICD-10-CM | POA: Diagnosis not present

## 2019-10-18 DIAGNOSIS — N184 Chronic kidney disease, stage 4 (severe): Secondary | ICD-10-CM | POA: Diagnosis not present

## 2019-10-21 DIAGNOSIS — D631 Anemia in chronic kidney disease: Secondary | ICD-10-CM | POA: Insufficient documentation

## 2019-10-21 DIAGNOSIS — E7889 Other lipoprotein metabolism disorders: Secondary | ICD-10-CM | POA: Insufficient documentation

## 2019-10-21 DIAGNOSIS — N189 Chronic kidney disease, unspecified: Secondary | ICD-10-CM | POA: Insufficient documentation

## 2019-10-22 DIAGNOSIS — N184 Chronic kidney disease, stage 4 (severe): Secondary | ICD-10-CM | POA: Diagnosis not present

## 2019-10-22 DIAGNOSIS — E211 Secondary hyperparathyroidism, not elsewhere classified: Secondary | ICD-10-CM | POA: Diagnosis not present

## 2019-10-22 DIAGNOSIS — D631 Anemia in chronic kidney disease: Secondary | ICD-10-CM | POA: Diagnosis not present

## 2019-10-22 DIAGNOSIS — R809 Proteinuria, unspecified: Secondary | ICD-10-CM | POA: Diagnosis not present

## 2019-10-22 DIAGNOSIS — E872 Acidosis: Secondary | ICD-10-CM | POA: Diagnosis not present

## 2019-10-22 DIAGNOSIS — N189 Chronic kidney disease, unspecified: Secondary | ICD-10-CM | POA: Diagnosis not present

## 2019-11-10 DIAGNOSIS — I2510 Atherosclerotic heart disease of native coronary artery without angina pectoris: Secondary | ICD-10-CM | POA: Diagnosis not present

## 2019-11-10 DIAGNOSIS — I129 Hypertensive chronic kidney disease with stage 1 through stage 4 chronic kidney disease, or unspecified chronic kidney disease: Secondary | ICD-10-CM | POA: Diagnosis not present

## 2019-11-10 DIAGNOSIS — N184 Chronic kidney disease, stage 4 (severe): Secondary | ICD-10-CM | POA: Diagnosis not present

## 2019-11-10 DIAGNOSIS — F419 Anxiety disorder, unspecified: Secondary | ICD-10-CM | POA: Diagnosis not present

## 2019-12-01 IMAGING — CT CT ABD-PELV W/O CM
2 of 4 series · 16 of 46 positions shown, 18 images · non-contrast
Comparison: 02/17/2013 CT and prior studies

CLINICAL DATA: 83-year-old male with acute abdominal pain and
UTI/sepsis.

EXAM:
CT ABDOMEN AND PELVIS WITHOUT CONTRAST
TECHNIQUE: Multidetector CT imaging of the abdomen and pelvis was performed
following the standard protocol without IV contrast.

[Series 2: axial st · axial · 0.72mm/px · z∈[+819,+1289]mm · 13 of 104 slices shown, 15 images]
[im 5/104  soft-tissue]
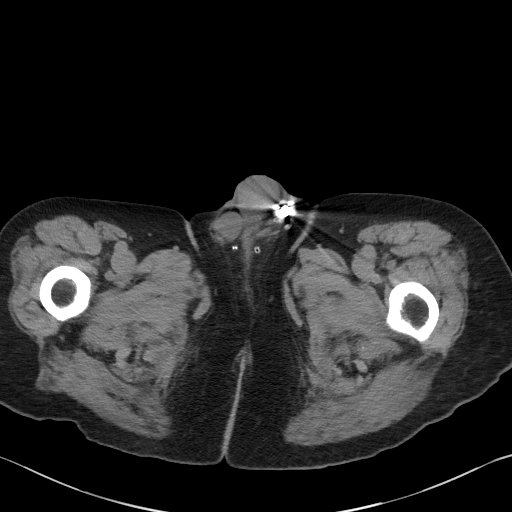
[im 5/104  bone]
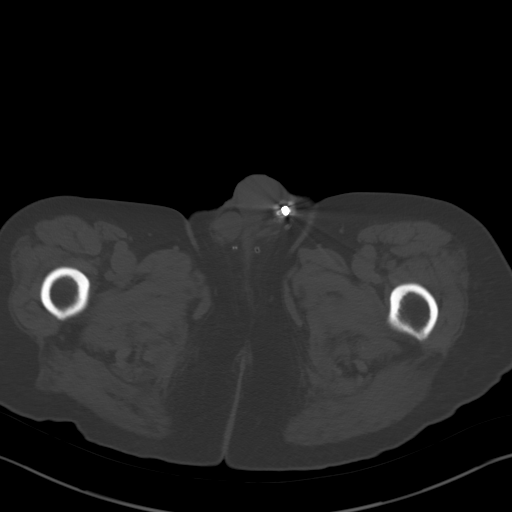
[im 14/104  soft-tissue]
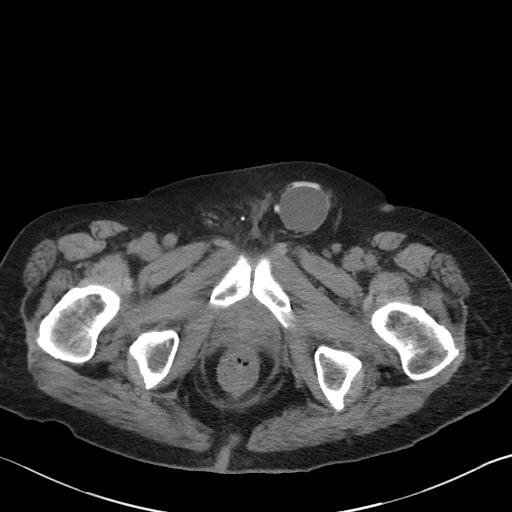
[im 23/104  soft-tissue]
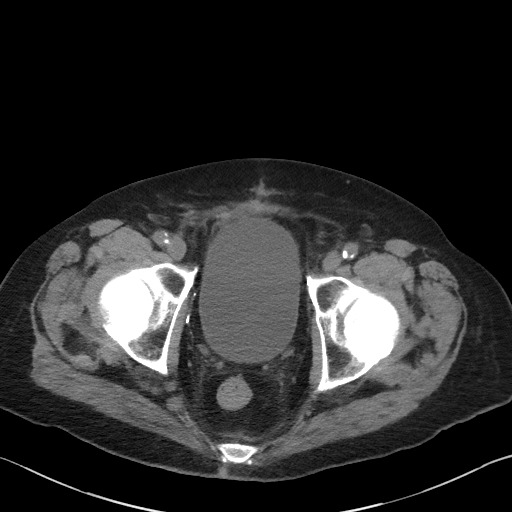
[im 27/104  soft-tissue]
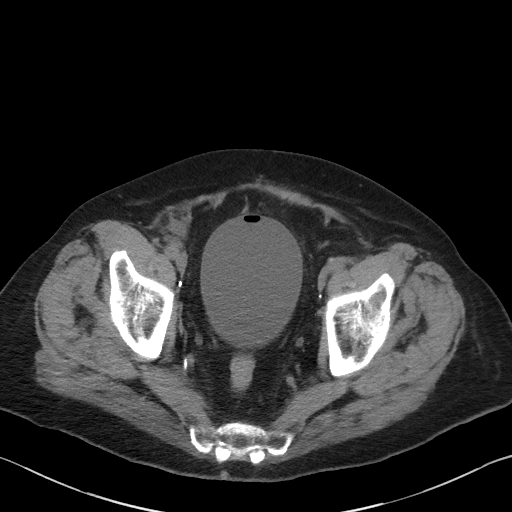
[im 36/104  soft-tissue]
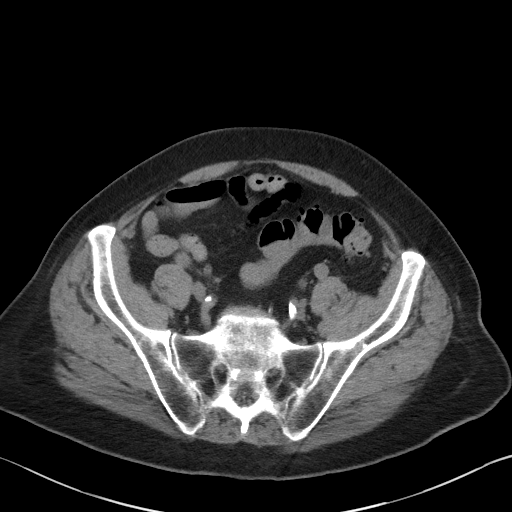
[im 45/104  soft-tissue]
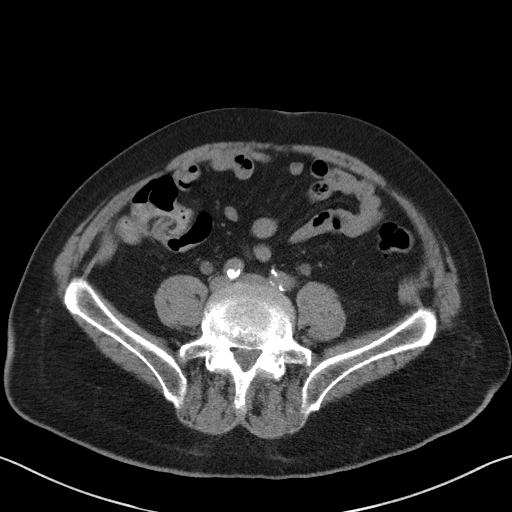
[im 54/104  soft-tissue]
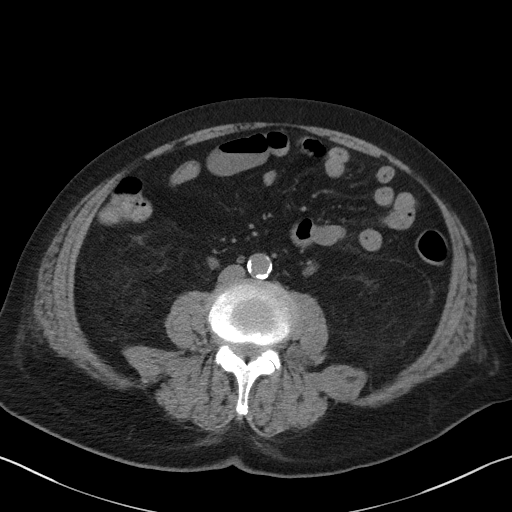
[im 59/104  soft-tissue]
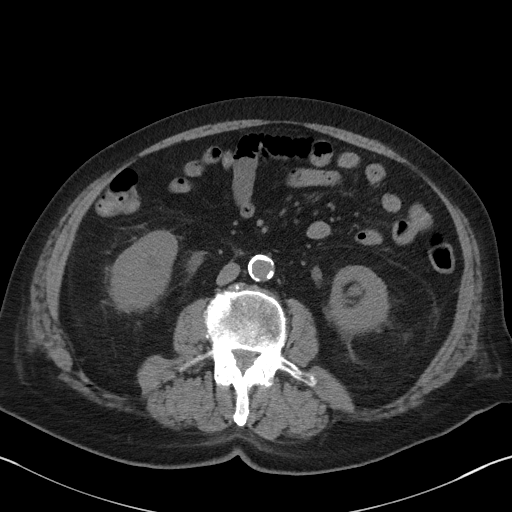
[im 68/104  soft-tissue]
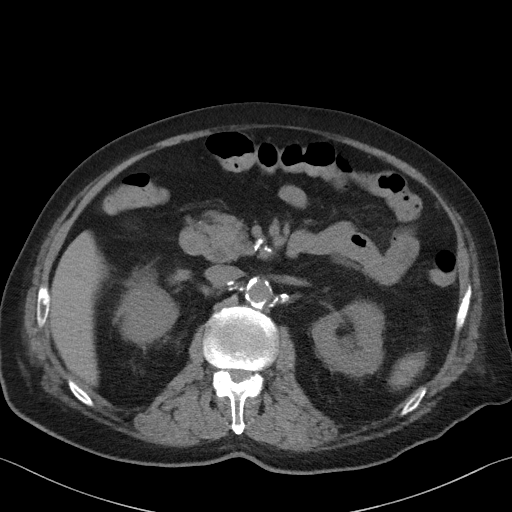
[im 68/104  bone]
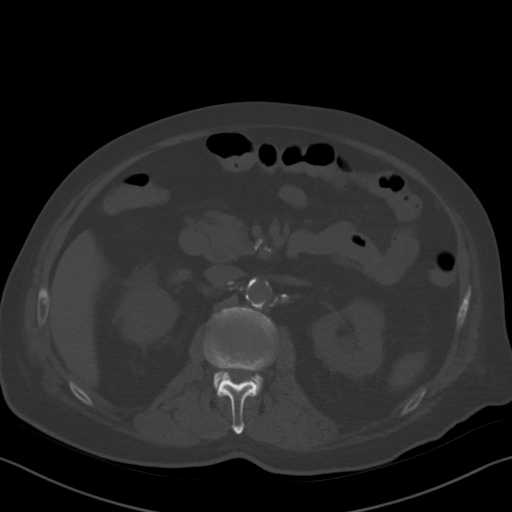
[im 77/104  soft-tissue]
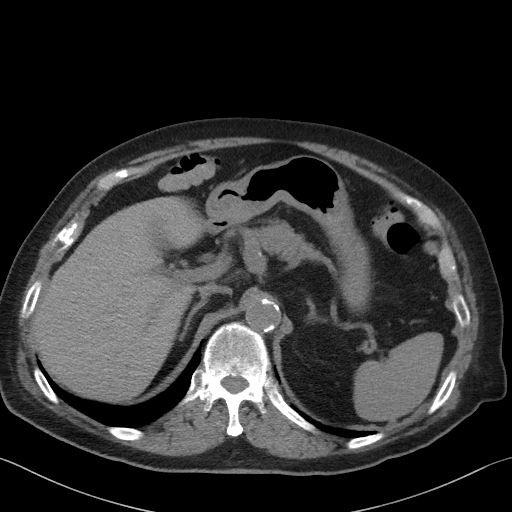
[im 81/104  soft-tissue]
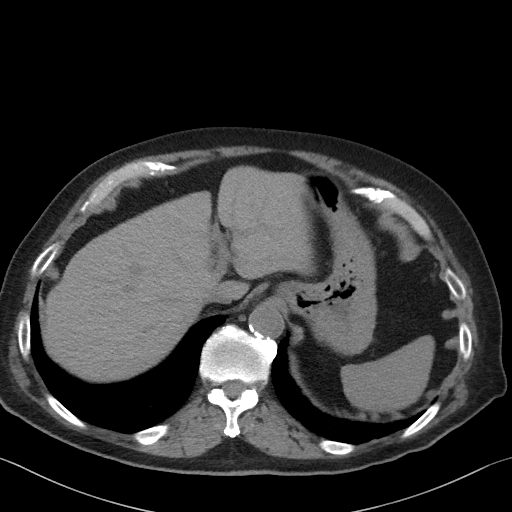
[im 90/104  soft-tissue]
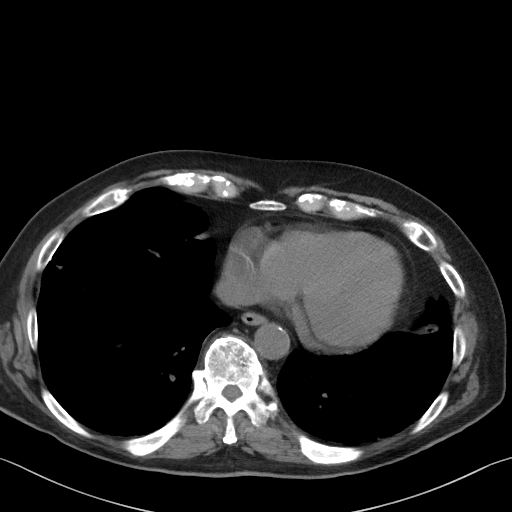
[im 99/104  soft-tissue]
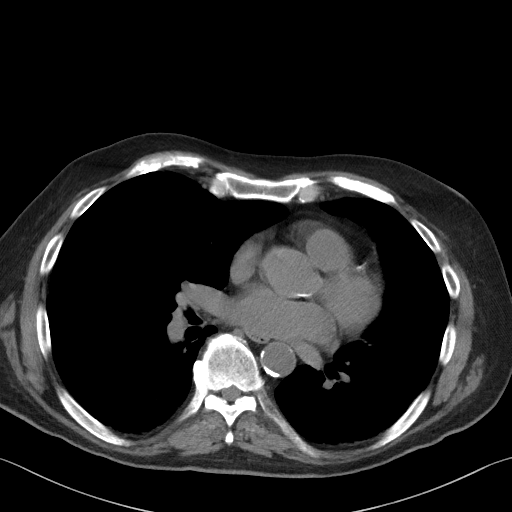

[Series 5: coronal st · coronal · 0.87mm/px · 3 of 92 slices shown]
[im 31/92  soft-tissue]
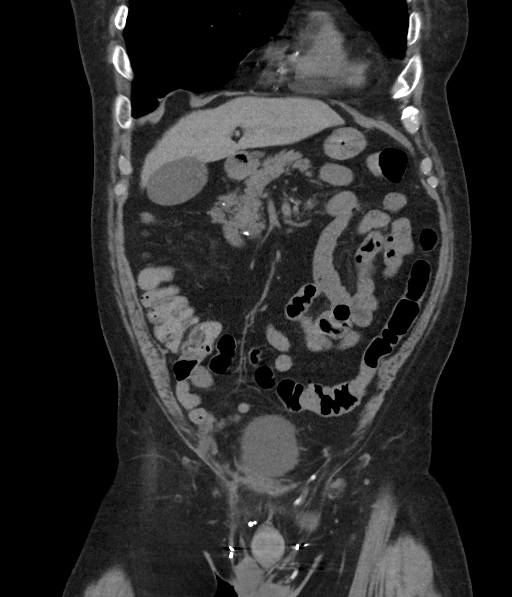
[im 41/92  soft-tissue]
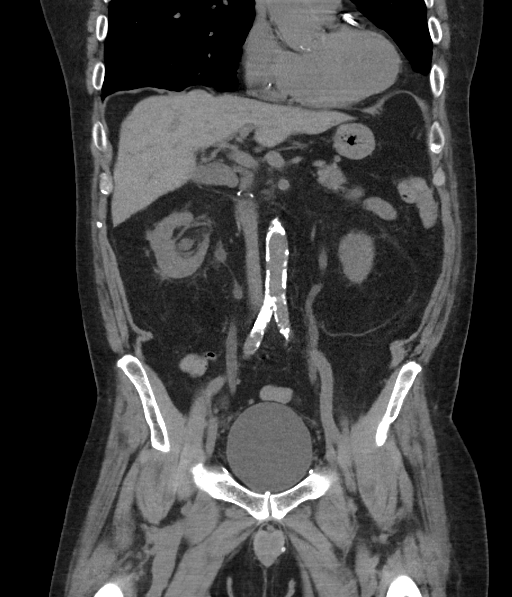
[im 51/92  soft-tissue]
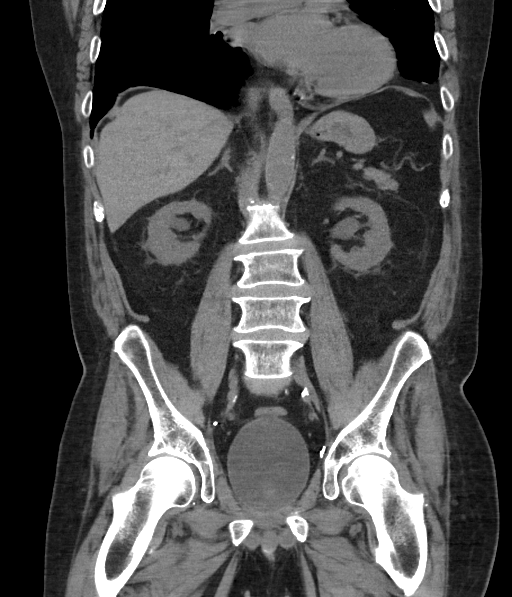

[16 of 46 positions shown; findings below may reference images not displayed]

FINDINGS: Please note that parenchymal abnormalities may be missed without
intravenous contrast.

Lower chest: No acute abnormality.

Hepatobiliary: The liver and gallbladder are unremarkable. No
biliary dilatation.

Pancreas: Unremarkable

Spleen: Unremarkable

Adrenals/Urinary Tract: Mild bilateral hydroureteronephrosis noted
to the bladder. A 4 cm filling defect within the bladder noted and
may represent a mass. A small amount of gas in the bladder may be
post catheterization or infection.

The adrenal glands are unremarkable.

Stomach/Bowel: Stomach is within normal limits. Appendix appears
normal. No evidence of bowel wall thickening, distention, or
inflammatory changes.

Vascular/Lymphatic: Aortic atherosclerosis. No enlarged abdominal or
pelvic lymph nodes.

Reproductive: Patient is status post prostatectomy. Urinary
sphincter hardware noted.

Other: No ascites, focal collection or pneumoperitoneum.

Musculoskeletal: No acute or suspicious bony abnormalities.
IMPRESSION: 1. 4 cm filling defect within the bladder which may represent
mass/malignancy. Urology consultation recommended. Mild bilateral
hydroureteronephrosis.
2. Small amount of gas within the bladder-question post
catheterization or infection.
3.  Aortic Atherosclerosis ([id number]).

## 2019-12-06 DIAGNOSIS — N184 Chronic kidney disease, stage 4 (severe): Secondary | ICD-10-CM | POA: Diagnosis not present

## 2019-12-06 DIAGNOSIS — D631 Anemia in chronic kidney disease: Secondary | ICD-10-CM | POA: Diagnosis not present

## 2019-12-06 DIAGNOSIS — R809 Proteinuria, unspecified: Secondary | ICD-10-CM | POA: Diagnosis not present

## 2019-12-06 DIAGNOSIS — E211 Secondary hyperparathyroidism, not elsewhere classified: Secondary | ICD-10-CM | POA: Diagnosis not present

## 2019-12-06 DIAGNOSIS — N189 Chronic kidney disease, unspecified: Secondary | ICD-10-CM | POA: Diagnosis not present

## 2019-12-06 DIAGNOSIS — E872 Acidosis: Secondary | ICD-10-CM | POA: Diagnosis not present

## 2019-12-10 ENCOUNTER — Other Ambulatory Visit: Payer: Self-pay | Admitting: Nephrology

## 2019-12-10 ENCOUNTER — Other Ambulatory Visit: Payer: Self-pay

## 2019-12-10 ENCOUNTER — Ambulatory Visit (HOSPITAL_COMMUNITY)
Admission: RE | Admit: 2019-12-10 | Discharge: 2019-12-10 | Disposition: A | Discharge status: Home / Self Care | Payer: MEDICARE | Source: Ambulatory Visit | Attending: Nephrology | Admitting: Nephrology

## 2019-12-10 ENCOUNTER — Other Ambulatory Visit (HOSPITAL_COMMUNITY): Payer: Self-pay | Admitting: Nephrology

## 2019-12-10 DIAGNOSIS — E211 Secondary hyperparathyroidism, not elsewhere classified: Secondary | ICD-10-CM | POA: Diagnosis not present

## 2019-12-10 DIAGNOSIS — R600 Localized edema: Secondary | ICD-10-CM | POA: Diagnosis not present

## 2019-12-10 DIAGNOSIS — R310 Gross hematuria: Secondary | ICD-10-CM | POA: Diagnosis not present

## 2019-12-10 DIAGNOSIS — E872 Acidosis: Secondary | ICD-10-CM | POA: Diagnosis not present

## 2019-12-10 DIAGNOSIS — N189 Chronic kidney disease, unspecified: Secondary | ICD-10-CM | POA: Diagnosis not present

## 2019-12-10 DIAGNOSIS — N185 Chronic kidney disease, stage 5: Secondary | ICD-10-CM

## 2019-12-10 DIAGNOSIS — R319 Hematuria, unspecified: Secondary | ICD-10-CM | POA: Diagnosis not present

## 2019-12-10 DIAGNOSIS — I129 Hypertensive chronic kidney disease with stage 1 through stage 4 chronic kidney disease, or unspecified chronic kidney disease: Secondary | ICD-10-CM | POA: Diagnosis not present

## 2019-12-10 DIAGNOSIS — N170 Acute kidney failure with tubular necrosis: Secondary | ICD-10-CM | POA: Diagnosis not present

## 2019-12-10 DIAGNOSIS — D631 Anemia in chronic kidney disease: Secondary | ICD-10-CM | POA: Diagnosis not present

## 2019-12-10 DIAGNOSIS — R809 Proteinuria, unspecified: Secondary | ICD-10-CM | POA: Diagnosis not present

## 2020-01-05 DIAGNOSIS — M1A00X0 Idiopathic chronic gout, unspecified site, without tophus (tophi): Secondary | ICD-10-CM | POA: Diagnosis not present

## 2020-01-05 DIAGNOSIS — F331 Major depressive disorder, recurrent, moderate: Secondary | ICD-10-CM | POA: Diagnosis not present

## 2020-01-05 DIAGNOSIS — I10 Essential (primary) hypertension: Secondary | ICD-10-CM | POA: Diagnosis not present

## 2020-01-05 DIAGNOSIS — G4700 Insomnia, unspecified: Secondary | ICD-10-CM | POA: Diagnosis not present

## 2020-01-05 DIAGNOSIS — K5900 Constipation, unspecified: Secondary | ICD-10-CM | POA: Diagnosis not present

## 2020-01-05 DIAGNOSIS — K219 Gastro-esophageal reflux disease without esophagitis: Secondary | ICD-10-CM | POA: Diagnosis not present

## 2020-01-05 DIAGNOSIS — N185 Chronic kidney disease, stage 5: Secondary | ICD-10-CM | POA: Diagnosis not present

## 2020-01-05 DIAGNOSIS — I25119 Atherosclerotic heart disease of native coronary artery with unspecified angina pectoris: Secondary | ICD-10-CM | POA: Diagnosis not present

## 2020-01-05 DIAGNOSIS — R42 Dizziness and giddiness: Secondary | ICD-10-CM | POA: Diagnosis not present

## 2020-01-05 DIAGNOSIS — F419 Anxiety disorder, unspecified: Secondary | ICD-10-CM | POA: Diagnosis not present

## 2020-01-05 DIAGNOSIS — Z0189 Encounter for other specified special examinations: Secondary | ICD-10-CM | POA: Diagnosis not present

## 2020-01-05 DIAGNOSIS — E785 Hyperlipidemia, unspecified: Secondary | ICD-10-CM | POA: Diagnosis not present

## 2020-01-19 DIAGNOSIS — K5900 Constipation, unspecified: Secondary | ICD-10-CM | POA: Diagnosis not present

## 2020-01-19 DIAGNOSIS — G4700 Insomnia, unspecified: Secondary | ICD-10-CM | POA: Diagnosis not present

## 2020-01-19 DIAGNOSIS — E785 Hyperlipidemia, unspecified: Secondary | ICD-10-CM | POA: Diagnosis not present

## 2020-01-19 DIAGNOSIS — F419 Anxiety disorder, unspecified: Secondary | ICD-10-CM | POA: Diagnosis not present

## 2020-01-19 DIAGNOSIS — F331 Major depressive disorder, recurrent, moderate: Secondary | ICD-10-CM | POA: Diagnosis not present

## 2020-01-19 DIAGNOSIS — R42 Dizziness and giddiness: Secondary | ICD-10-CM | POA: Diagnosis not present

## 2020-01-19 DIAGNOSIS — R7301 Impaired fasting glucose: Secondary | ICD-10-CM | POA: Diagnosis not present

## 2020-01-19 DIAGNOSIS — K219 Gastro-esophageal reflux disease without esophagitis: Secondary | ICD-10-CM | POA: Diagnosis not present

## 2020-01-19 DIAGNOSIS — N185 Chronic kidney disease, stage 5: Secondary | ICD-10-CM | POA: Diagnosis not present

## 2020-01-19 DIAGNOSIS — I10 Essential (primary) hypertension: Secondary | ICD-10-CM | POA: Diagnosis not present

## 2020-01-19 DIAGNOSIS — M1A00X0 Idiopathic chronic gout, unspecified site, without tophus (tophi): Secondary | ICD-10-CM | POA: Diagnosis not present

## 2020-01-19 DIAGNOSIS — Z0189 Encounter for other specified special examinations: Secondary | ICD-10-CM | POA: Diagnosis not present

## 2020-01-19 DIAGNOSIS — I25119 Atherosclerotic heart disease of native coronary artery with unspecified angina pectoris: Secondary | ICD-10-CM | POA: Diagnosis not present

## 2020-01-24 DIAGNOSIS — I25119 Atherosclerotic heart disease of native coronary artery with unspecified angina pectoris: Secondary | ICD-10-CM | POA: Diagnosis not present

## 2020-01-24 DIAGNOSIS — K5900 Constipation, unspecified: Secondary | ICD-10-CM | POA: Diagnosis not present

## 2020-01-24 DIAGNOSIS — I10 Essential (primary) hypertension: Secondary | ICD-10-CM | POA: Diagnosis not present

## 2020-01-24 DIAGNOSIS — G4700 Insomnia, unspecified: Secondary | ICD-10-CM | POA: Diagnosis not present

## 2020-01-24 DIAGNOSIS — N185 Chronic kidney disease, stage 5: Secondary | ICD-10-CM | POA: Diagnosis not present

## 2020-01-24 DIAGNOSIS — M1A00X0 Idiopathic chronic gout, unspecified site, without tophus (tophi): Secondary | ICD-10-CM | POA: Diagnosis not present

## 2020-01-24 DIAGNOSIS — E785 Hyperlipidemia, unspecified: Secondary | ICD-10-CM | POA: Diagnosis not present

## 2020-01-24 DIAGNOSIS — K219 Gastro-esophageal reflux disease without esophagitis: Secondary | ICD-10-CM | POA: Diagnosis not present

## 2020-01-24 DIAGNOSIS — F419 Anxiety disorder, unspecified: Secondary | ICD-10-CM | POA: Diagnosis not present

## 2020-01-24 DIAGNOSIS — F331 Major depressive disorder, recurrent, moderate: Secondary | ICD-10-CM | POA: Diagnosis not present

## 2020-01-24 DIAGNOSIS — R42 Dizziness and giddiness: Secondary | ICD-10-CM | POA: Diagnosis not present

## 2020-01-27 DIAGNOSIS — N184 Chronic kidney disease, stage 4 (severe): Secondary | ICD-10-CM | POA: Diagnosis not present

## 2020-01-27 DIAGNOSIS — R809 Proteinuria, unspecified: Secondary | ICD-10-CM | POA: Diagnosis not present

## 2020-01-27 DIAGNOSIS — I129 Hypertensive chronic kidney disease with stage 1 through stage 4 chronic kidney disease, or unspecified chronic kidney disease: Secondary | ICD-10-CM | POA: Diagnosis not present

## 2020-01-27 DIAGNOSIS — E211 Secondary hyperparathyroidism, not elsewhere classified: Secondary | ICD-10-CM | POA: Diagnosis not present

## 2020-01-27 DIAGNOSIS — Z79899 Other long term (current) drug therapy: Secondary | ICD-10-CM | POA: Diagnosis not present

## 2020-01-27 DIAGNOSIS — R600 Localized edema: Secondary | ICD-10-CM | POA: Diagnosis not present

## 2020-01-27 DIAGNOSIS — N189 Chronic kidney disease, unspecified: Secondary | ICD-10-CM | POA: Diagnosis not present

## 2020-01-27 DIAGNOSIS — N170 Acute kidney failure with tubular necrosis: Secondary | ICD-10-CM | POA: Diagnosis not present

## 2020-01-27 DIAGNOSIS — D631 Anemia in chronic kidney disease: Secondary | ICD-10-CM | POA: Diagnosis not present

## 2020-01-27 DIAGNOSIS — E872 Acidosis: Secondary | ICD-10-CM | POA: Diagnosis not present

## 2020-02-04 ENCOUNTER — Other Ambulatory Visit: Payer: Self-pay

## 2020-02-04 ENCOUNTER — Encounter (HOSPITAL_COMMUNITY)
Admission: RE | Admit: 2020-02-04 | Discharge: 2020-02-04 | Disposition: A | Discharge status: Home / Self Care | Payer: MEDICARE | Source: Ambulatory Visit | Attending: Nephrology | Admitting: Nephrology

## 2020-02-04 ENCOUNTER — Encounter (HOSPITAL_COMMUNITY): Payer: Self-pay

## 2020-02-04 DIAGNOSIS — N184 Chronic kidney disease, stage 4 (severe): Secondary | ICD-10-CM | POA: Insufficient documentation

## 2020-02-04 DIAGNOSIS — D631 Anemia in chronic kidney disease: Secondary | ICD-10-CM | POA: Diagnosis not present

## 2020-02-04 DIAGNOSIS — D509 Iron deficiency anemia, unspecified: Secondary | ICD-10-CM | POA: Diagnosis not present

## 2020-02-04 LAB — POCT HEMOGLOBIN-HEMACUE: Hemoglobin: 8.6 g/dL — ABNORMAL LOW (ref 13.0–17.0)

## 2020-02-04 MED ORDER — SODIUM CHLORIDE 0.9 % IV SOLN: Freq: Once | INTRAVENOUS | Status: AC

## 2020-02-04 MED ORDER — EPOETIN ALFA 3000 UNIT/ML IJ SOLN
3000.0000 [IU] | Freq: Once | INTRAMUSCULAR | Status: AC
  Administered 2020-02-04: 11:00:00 3000 [IU] via SUBCUTANEOUS

## 2020-02-04 MED ORDER — SODIUM CHLORIDE 0.9 % IV SOLN
510.0000 mg | Freq: Once | INTRAVENOUS | Status: AC
  Administered 2020-02-04: 510 mg via INTRAVENOUS
  Filled 2020-02-04: qty 510

## 2020-02-11 ENCOUNTER — Other Ambulatory Visit: Payer: Self-pay

## 2020-02-11 ENCOUNTER — Encounter (HOSPITAL_COMMUNITY)
Admission: RE | Admit: 2020-02-11 | Discharge: 2020-02-11 | Disposition: A | Discharge status: Home / Self Care | Payer: MEDICARE | Source: Ambulatory Visit | Attending: Nephrology | Admitting: Nephrology

## 2020-02-11 DIAGNOSIS — N184 Chronic kidney disease, stage 4 (severe): Secondary | ICD-10-CM | POA: Diagnosis not present

## 2020-02-11 DIAGNOSIS — D509 Iron deficiency anemia, unspecified: Secondary | ICD-10-CM | POA: Diagnosis not present

## 2020-02-11 MED ORDER — SODIUM CHLORIDE 0.9 % IV SOLN
510.0000 mg | Freq: Once | INTRAVENOUS | Status: AC
  Administered 2020-02-11: 13:00:00 510 mg via INTRAVENOUS
  Filled 2020-02-11: qty 510

## 2020-02-11 MED ORDER — SODIUM CHLORIDE 0.9 % IV SOLN: Freq: Once | INTRAVENOUS | Status: AC

## 2020-02-17 DIAGNOSIS — Z79899 Other long term (current) drug therapy: Secondary | ICD-10-CM | POA: Diagnosis not present

## 2020-02-17 DIAGNOSIS — E872 Acidosis: Secondary | ICD-10-CM | POA: Diagnosis not present

## 2020-02-17 DIAGNOSIS — N189 Chronic kidney disease, unspecified: Secondary | ICD-10-CM | POA: Diagnosis not present

## 2020-02-17 DIAGNOSIS — E211 Secondary hyperparathyroidism, not elsewhere classified: Secondary | ICD-10-CM | POA: Diagnosis not present

## 2020-02-17 DIAGNOSIS — R809 Proteinuria, unspecified: Secondary | ICD-10-CM | POA: Diagnosis not present

## 2020-02-17 DIAGNOSIS — R600 Localized edema: Secondary | ICD-10-CM | POA: Diagnosis not present

## 2020-02-17 DIAGNOSIS — N184 Chronic kidney disease, stage 4 (severe): Secondary | ICD-10-CM | POA: Diagnosis not present

## 2020-02-17 DIAGNOSIS — D631 Anemia in chronic kidney disease: Secondary | ICD-10-CM | POA: Diagnosis not present

## 2020-02-17 DIAGNOSIS — I129 Hypertensive chronic kidney disease with stage 1 through stage 4 chronic kidney disease, or unspecified chronic kidney disease: Secondary | ICD-10-CM | POA: Diagnosis not present

## 2020-02-17 DIAGNOSIS — N170 Acute kidney failure with tubular necrosis: Secondary | ICD-10-CM | POA: Diagnosis not present

## 2020-02-18 ENCOUNTER — Encounter (HOSPITAL_COMMUNITY)
Admission: RE | Admit: 2020-02-18 | Discharge: 2020-02-18 | Disposition: A | Discharge status: Home / Self Care | Payer: MEDICARE | Source: Ambulatory Visit | Attending: Nephrology | Admitting: Nephrology

## 2020-02-18 ENCOUNTER — Encounter (HOSPITAL_COMMUNITY): Payer: Self-pay

## 2020-02-18 ENCOUNTER — Encounter (HOSPITAL_COMMUNITY): Admission: RE | Admit: 2020-02-18 | Payer: MEDICARE | Source: Ambulatory Visit

## 2020-02-18 ENCOUNTER — Other Ambulatory Visit: Payer: Self-pay

## 2020-02-18 DIAGNOSIS — D509 Iron deficiency anemia, unspecified: Secondary | ICD-10-CM | POA: Diagnosis not present

## 2020-02-18 DIAGNOSIS — N184 Chronic kidney disease, stage 4 (severe): Secondary | ICD-10-CM | POA: Diagnosis not present

## 2020-02-18 LAB — POCT HEMOGLOBIN-HEMACUE: Hemoglobin: 10.5 g/dL — ABNORMAL LOW (ref 13.0–17.0)

## 2020-02-18 LAB — PROTEIN / CREATININE RATIO, URINE
Creatinine, Urine: 26.95 mg/dL
Protein Creatinine Ratio: 0.85 mg/mg{Cre} — ABNORMAL HIGH (ref 0.00–0.15)
Total Protein, Urine: 23 mg/dL

## 2020-02-18 MED ORDER — EPOETIN ALFA 3000 UNIT/ML IJ SOLN: 3000.0000 [IU] | Freq: Once | INTRAMUSCULAR | Status: DC

## 2020-02-24 DIAGNOSIS — E211 Secondary hyperparathyroidism, not elsewhere classified: Secondary | ICD-10-CM | POA: Diagnosis not present

## 2020-02-24 DIAGNOSIS — D631 Anemia in chronic kidney disease: Secondary | ICD-10-CM | POA: Diagnosis not present

## 2020-02-24 DIAGNOSIS — E872 Acidosis: Secondary | ICD-10-CM | POA: Diagnosis not present

## 2020-02-24 DIAGNOSIS — I129 Hypertensive chronic kidney disease with stage 1 through stage 4 chronic kidney disease, or unspecified chronic kidney disease: Secondary | ICD-10-CM | POA: Diagnosis not present

## 2020-02-24 DIAGNOSIS — N184 Chronic kidney disease, stage 4 (severe): Secondary | ICD-10-CM | POA: Diagnosis not present

## 2020-02-24 DIAGNOSIS — R809 Proteinuria, unspecified: Secondary | ICD-10-CM | POA: Diagnosis not present

## 2020-02-24 DIAGNOSIS — N189 Chronic kidney disease, unspecified: Secondary | ICD-10-CM | POA: Diagnosis not present

## 2020-02-24 DIAGNOSIS — R600 Localized edema: Secondary | ICD-10-CM | POA: Diagnosis not present

## 2020-03-07 ENCOUNTER — Other Ambulatory Visit: Payer: Self-pay

## 2020-03-07 ENCOUNTER — Encounter (HOSPITAL_COMMUNITY)
Admission: RE | Admit: 2020-03-07 | Discharge: 2020-03-07 | Disposition: A | Discharge status: Home / Self Care | Payer: MEDICARE | Source: Ambulatory Visit | Attending: Nephrology | Admitting: Nephrology

## 2020-03-07 ENCOUNTER — Encounter (HOSPITAL_COMMUNITY): Payer: Self-pay

## 2020-03-07 DIAGNOSIS — D631 Anemia in chronic kidney disease: Secondary | ICD-10-CM | POA: Diagnosis not present

## 2020-03-07 DIAGNOSIS — N184 Chronic kidney disease, stage 4 (severe): Secondary | ICD-10-CM | POA: Diagnosis not present

## 2020-03-07 LAB — POCT HEMOGLOBIN-HEMACUE: Hemoglobin: 10.4 g/dL — ABNORMAL LOW (ref 13.0–17.0)

## 2020-03-07 MED ORDER — EPOETIN ALFA 3000 UNIT/ML IJ SOLN: 3000.0000 [IU] | Freq: Once | INTRAMUSCULAR | Status: DC

## 2020-03-21 ENCOUNTER — Encounter (HOSPITAL_COMMUNITY)
Admission: RE | Admit: 2020-03-21 | Discharge: 2020-03-21 | Disposition: A | Discharge status: Home / Self Care | Payer: MEDICARE | Source: Ambulatory Visit | Attending: Nephrology | Admitting: Nephrology

## 2020-03-21 ENCOUNTER — Encounter (HOSPITAL_COMMUNITY): Payer: Self-pay

## 2020-03-21 ENCOUNTER — Other Ambulatory Visit: Payer: Self-pay

## 2020-03-21 ENCOUNTER — Ambulatory Visit (HOSPITAL_COMMUNITY): Payer: MEDICARE

## 2020-03-21 DIAGNOSIS — D631 Anemia in chronic kidney disease: Secondary | ICD-10-CM | POA: Diagnosis not present

## 2020-03-21 DIAGNOSIS — N184 Chronic kidney disease, stage 4 (severe): Secondary | ICD-10-CM | POA: Diagnosis not present

## 2020-03-21 LAB — POCT HEMOGLOBIN-HEMACUE: Hemoglobin: 8.9 g/dL — ABNORMAL LOW (ref 13.0–17.0)

## 2020-03-21 MED ORDER — EPOETIN ALFA 3000 UNIT/ML IJ SOLN
INTRAMUSCULAR | Status: AC
  Filled 2020-03-21: qty 1

## 2020-03-21 MED ORDER — EPOETIN ALFA 3000 UNIT/ML IJ SOLN
3000.0000 [IU] | Freq: Once | INTRAMUSCULAR | Status: AC
  Administered 2020-03-21: 13:00:00 3000 [IU] via SUBCUTANEOUS

## 2020-03-30 ENCOUNTER — Other Ambulatory Visit: Payer: Self-pay | Admitting: *Deleted

## 2020-03-30 DIAGNOSIS — N184 Chronic kidney disease, stage 4 (severe): Secondary | ICD-10-CM

## 2020-04-04 ENCOUNTER — Encounter (HOSPITAL_COMMUNITY): Payer: Self-pay

## 2020-04-04 ENCOUNTER — Other Ambulatory Visit: Payer: Self-pay

## 2020-04-04 ENCOUNTER — Encounter (HOSPITAL_COMMUNITY)
Admission: RE | Admit: 2020-04-04 | Discharge: 2020-04-04 | Disposition: A | Discharge status: Home / Self Care | Payer: MEDICARE | Source: Ambulatory Visit | Attending: Nephrology | Admitting: Nephrology

## 2020-04-04 DIAGNOSIS — D631 Anemia in chronic kidney disease: Secondary | ICD-10-CM | POA: Insufficient documentation

## 2020-04-04 DIAGNOSIS — N184 Chronic kidney disease, stage 4 (severe): Secondary | ICD-10-CM | POA: Diagnosis not present

## 2020-04-04 LAB — POCT HEMOGLOBIN-HEMACUE: Hemoglobin: 11.5 g/dL — ABNORMAL LOW (ref 13.0–17.0)

## 2020-04-04 MED ORDER — EPOETIN ALFA 3000 UNIT/ML IJ SOLN: 3000.0000 [IU] | Freq: Once | INTRAMUSCULAR | Status: DC

## 2020-04-05 ENCOUNTER — Ambulatory Visit (INDEPENDENT_AMBULATORY_CARE_PROVIDER_SITE_OTHER)
Admission: RE | Admit: 2020-04-05 | Discharge: 2020-04-05 | Disposition: A | Discharge status: Home / Self Care | Payer: MEDICARE | Source: Ambulatory Visit | Attending: Surgery | Admitting: Surgery

## 2020-04-05 ENCOUNTER — Encounter: Payer: Self-pay | Admitting: Vascular Surgery

## 2020-04-05 ENCOUNTER — Other Ambulatory Visit: Payer: Self-pay

## 2020-04-05 ENCOUNTER — Ambulatory Visit (HOSPITAL_COMMUNITY)
Admission: RE | Admit: 2020-04-05 | Discharge: 2020-04-05 | Disposition: A | Discharge status: Home / Self Care | Payer: MEDICARE | Source: Ambulatory Visit | Attending: Surgery | Admitting: Surgery

## 2020-04-05 ENCOUNTER — Ambulatory Visit (INDEPENDENT_AMBULATORY_CARE_PROVIDER_SITE_OTHER): Payer: MEDICARE | Admitting: Vascular Surgery

## 2020-04-05 VITALS — BP 168/73 | HR 74 | Temp 98.0°F | Resp 20 | Ht 68.0 in | Wt 191.0 lb

## 2020-04-05 DIAGNOSIS — N184 Chronic kidney disease, stage 4 (severe): Secondary | ICD-10-CM | POA: Insufficient documentation

## 2020-04-05 NOTE — Progress Notes (Signed)
REASON FOR CONSULT:    To evaluate for hemodialysis access.  The consult is requested by Dr. Theador Hawthorne.   ASSESSMENT & PLAN:   STAGE IV CHRONIC KIDNEY DISEASE: Patient has stage IV chronic kidney disease and presents for evaluation for hemodialysis access.  Based on his vein map he does not appear to be a good candidate for a fistula.  For this reason I have recommended placement of a left arm AV graft.  We have discussed the indications of the procedure and the potential complications and all his questions were answered.  He is having iron treatments for anemia and would like to wait till mid June for the surgery which I think is reasonable.  Since replacing her graft the graft would be able to be cannulated 1 month postop.  His surgery has been scheduled for 05/15/2020.  [name], MD Office: [phone]   HPI:   [name] [name] is a pleasant 86 y.o. male, who was referred for evaluation for hemodialysis access.  I have reviewed the notes from the referring office.  The patient was seen on 02/24/2020.  Patient has known chronic kidney disease secondary to hypertension.  GFR at that time was 15.  Previous ultrasound showed that the right kidney measured 9 cm in length and the left kidney measures 9-1/2 cm in length.   On my history, the patient has end-stage renal disease secondary hypertension.  He is right-handed.  He is not on dialysis.  He does not have a pacemaker.  He is not on blood thinners.  He denies any recent uremic symptoms.  Specifically he denies nausea, vomiting, fatigue, anorexia, or palpitations.  Past Medical History:  Diagnosis Date  . 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   . Cancer ([place])    prostate ca 1999/removal/ rad tx  . Carotid artery occlusion   . Hyperlipidemia   . Hypertension   . Incontinence   . Reflux   . Renal disorder   . Status post implantation of artificial urinary  sphincter     Family History  Problem Relation Age of Onset  . Coronary artery disease Other        family history, male < 67  . Arthritis Other        family history  . Cancer Other   . Kidney disease Other     SOCIAL HISTORY: Social History   Socioeconomic History  . Marital status: Widowed    Spouse name: Not on file  . Number of children: Not on file  . Years of education: Not on file  . Highest education level: Not on file  Occupational History  . Occupation: retired  Tobacco Use  . Smoking status: Former Smoker    Packs/day: 2.00    Types: Cigarettes    Start date: 08/05/1962    Quit date: 12/02/1976    Years since quitting: 43.3  . Smokeless tobacco: Never Used  Substance and Sexual Activity  . Alcohol use: No  . Drug use: No  . Sexual activity: Not on file  Other Topics Concern  . Not on file  Social History Narrative  . Not on file   Social Determinants of Health   Financial Resource Strain:   . Difficulty of Paying Living Expenses:   Food Insecurity:   . Worried About Charity fundraiser in the Last Year:   . [place] in the  Last Year:   Transportation Needs:   . Film/video editor (Medical):   Marland Kitchen Lack of Transportation (Non-Medical):   Physical Activity:   . Days of Exercise per Week:   . Minutes of Exercise per Session:   Stress:   . Feeling of Stress :   Social Connections:   . Frequency of Communication with Friends and Family:   . Frequency of Social Gatherings with Friends and Family:   . Attends Religious Services:   . Active Member of Clubs or Organizations:   . Attends Archivist Meetings:   Marland Kitchen Marital Status:   Intimate Partner Violence:   . Fear of Current or Ex-Partner:   . Emotionally Abused:   Marland Kitchen Physically Abused:   . Sexually Abused:     Allergies  Allergen Reactions  . Ivp Dye [Iodinated Diagnostic Agents]     Due to partial renal failure  . Nalbuphine Other (See Comments)    PATIENT STATES IT MADE HIM  FEEL LIKE HE WAS GOING TO DIE. NO SPECIFICS  . Codeine Anxiety    Current Outpatient Medications  Medication Sig Dispense Refill  . acetaminophen (TYLENOL) 500 MG tablet Take 1,000 mg by mouth every 8 (eight) hours as needed for mild pain or moderate pain. For headache pain     . ALFALFA PO Take 250 mg by mouth 2 (two) times daily.     Marland Kitchen allopurinol (ZYLOPRIM) 100 MG tablet TK 1 T PO ONCE D  0  . ALPRAZolam (XANAX) 0.5 MG tablet TK 1 T PO  BID  5  . amLODipine (NORVASC) 5 MG tablet Take 5 mg by mouth daily.    . ARIPiprazole (ABILIFY) 20 MG tablet Take 20 mg by mouth daily.    Marland Kitchen ceFEPIme 1 g in sodium chloride 0.9 % 100 mL Inject 1 g into the vein daily. 7 Dose 0  . epoetin alfa (EPOGEN) 3000 UNIT/ML injection Inject 3,000 Units into the vein every 14 (fourteen) days.    . fish oil-omega-3 fatty acids 1000 MG capsule Take 1 g by mouth daily.     . lansoprazole (PREVACID) 15 MG capsule     . meclizine (ANTIVERT) 12.5 MG tablet Take 12.5 mg by mouth daily.    . Multiple Vitamin (MULTIVITAMIN WITH MINERALS) TABS tablet Take 1 tablet by mouth daily.    Marland Kitchen ofloxacin (FLOXIN) 0.3 % OTIC solution PUT 5 DROPS TWICE DAILY INTO AFFECTED EAR  2  . Probiotic Product (RISA-BID PROBIOTIC) TABS Give 1 tablet by mouth twice a day from 09/14/2018-09/24/2018    . rosuvastatin (CRESTOR) 10 MG tablet Take 10 mg by mouth daily.     . sertraline (ZOLOFT) 100 MG tablet Take 100 mg by mouth daily.    . sodium bicarbonate 650 MG tablet TK 1 T PO  TID  1   No current facility-administered medications for this visit.    REVIEW OF SYSTEMS:  [X]  denotes positive finding, [ ]  denotes negative finding Cardiac  Comments:  Chest pain or chest pressure:    Shortness of breath upon exertion:    Short of breath when lying flat:    Irregular heart rhythm:        Vascular    Pain in calf, thigh, or hip brought on by ambulation:    Pain in feet at night that wakes you up from your sleep:     Blood clot in your veins:     Leg swelling:  Pulmonary    Oxygen at home:    Productive cough:     Wheezing:         Neurologic    Sudden weakness in arms or legs:     Sudden numbness in arms or legs:     Sudden onset of difficulty speaking or slurred speech:    Temporary loss of vision in one eye:     Problems with dizziness:         Gastrointestinal    Blood in stool:     Vomited blood:         Genitourinary    Burning when urinating:     Blood in urine:        Psychiatric    Major depression:         Hematologic    Bleeding problems:    Problems with blood clotting too easily:        Skin    Rashes or ulcers:        Constitutional    Fever or chills:     PHYSICAL EXAM:   Vitals:   04/05/20 1359  BP: (!) 168/73  Pulse: 74  Resp: 20  Temp: 98 F (36.7 C)  SpO2: 96%  Weight: 191 lb (86.6 kg)  Height: 5\' 8"  (1.727 m)    GENERAL: The patient is a well-nourished male, in no acute distress. The vital signs are documented above. CARDIAC: There is a regular rate and rhythm.  VASCULAR: I do not detect carotid bruits. He has a palpable brachial and radial pulse bilaterally. He has bilateral lower extremity swelling. PULMONARY: There is good air exchange bilaterally without wheezing or rales. ABDOMEN: Soft and non-tender with normal pitched bowel sounds.  MUSCULOSKELETAL: There are no major deformities or cyanosis. NEUROLOGIC: No focal weakness or paresthesias are detected. SKIN: There are no ulcers or rashes noted. PSYCHIATRIC: The patient has a normal affect.  DATA:    UPPER EXTREMITY VEIN MAP: I have independently interpreted his upper extremity vein map today.  On the right side the forearm and upper arm cephalic vein are small and likely not usable for a fistula.  The basilic vein is fairly small with diameters ranging from 0.2-0.44 cm.  On the left side there is chronic thrombus in the upper arm cephalic vein.  The forearm cephalic vein does not appear to be usable.  The  basilic vein does not appear to be usable.  UPPER EXTREMITY ARTERIAL DUPLEX: I have independently interpreted his upper extremity arterial duplex scan.  On the right side there is a triphasic radial and ulnar waveform.  The brachial artery measures 0.57 cm in diameter.  On the left side there is a triphasic radial and ulnar waveform.  The brachial artery measures 0.55 cm in diameter.

## 2020-04-13 ENCOUNTER — Encounter (HOSPITAL_COMMUNITY): Payer: Self-pay | Admitting: Emergency Medicine

## 2020-04-13 ENCOUNTER — Other Ambulatory Visit: Payer: Self-pay

## 2020-04-13 ENCOUNTER — Emergency Department (HOSPITAL_COMMUNITY): Payer: MEDICARE

## 2020-04-13 ENCOUNTER — Emergency Department (HOSPITAL_COMMUNITY)
Admission: EM | Admit: 2020-04-13 | Discharge: 2020-04-13 | Disposition: A | Discharge status: Home / Self Care | Payer: MEDICARE | Attending: Emergency Medicine | Admitting: Emergency Medicine

## 2020-04-13 DIAGNOSIS — Z87891 Personal history of nicotine dependence: Secondary | ICD-10-CM | POA: Diagnosis not present

## 2020-04-13 DIAGNOSIS — S3992XA Unspecified injury of lower back, initial encounter: Secondary | ICD-10-CM | POA: Diagnosis present

## 2020-04-13 DIAGNOSIS — S32030A Wedge compression fracture of third lumbar vertebra, initial encounter for closed fracture: Secondary | ICD-10-CM

## 2020-04-13 DIAGNOSIS — N184 Chronic kidney disease, stage 4 (severe): Secondary | ICD-10-CM | POA: Diagnosis not present

## 2020-04-13 DIAGNOSIS — Y939 Activity, unspecified: Secondary | ICD-10-CM | POA: Insufficient documentation

## 2020-04-13 DIAGNOSIS — Z79899 Other long term (current) drug therapy: Secondary | ICD-10-CM | POA: Diagnosis not present

## 2020-04-13 DIAGNOSIS — I129 Hypertensive chronic kidney disease with stage 1 through stage 4 chronic kidney disease, or unspecified chronic kidney disease: Secondary | ICD-10-CM | POA: Insufficient documentation

## 2020-04-13 DIAGNOSIS — I2510 Atherosclerotic heart disease of native coronary artery without angina pectoris: Secondary | ICD-10-CM | POA: Insufficient documentation

## 2020-04-13 DIAGNOSIS — Y929 Unspecified place or not applicable: Secondary | ICD-10-CM | POA: Insufficient documentation

## 2020-04-13 DIAGNOSIS — X500XXA Overexertion from strenuous movement or load, initial encounter: Secondary | ICD-10-CM | POA: Diagnosis not present

## 2020-04-13 DIAGNOSIS — Y999 Unspecified external cause status: Secondary | ICD-10-CM | POA: Insufficient documentation

## 2020-04-13 DIAGNOSIS — M545 Low back pain: Secondary | ICD-10-CM | POA: Diagnosis not present

## 2020-04-13 NOTE — ED Provider Notes (Signed)
[facility] EMERGENCY DEPARTMENT Provider Note   CSN: [id number] Arrival date & time: 04/13/20  1131     History Chief Complaint  Patient presents with  . Back Pain    [name] [name] is a 86 y.o. male.   Back Pain Location:  Lumbar spine Quality:  Aching Radiates to:  Does not radiate Pain severity:  Moderate Pain is:  Worse during the day Onset quality:  Gradual Duration:  1 week Timing:  Constant Progression:  Unchanged Chronicity:  New Context: lifting heavy objects   Relieved by:  Being still (tylenol) Worsened by:  Ambulation, bending, movement and twisting (change in position) Associated symptoms: no abdominal pain, no abdominal swelling, no bladder incontinence, no bowel incontinence, no chest pain, no dysuria, no fever, no headaches, no leg pain, no numbness, no paresthesias, no pelvic pain, no perianal numbness, no tingling, no weakness and no weight loss   Risk factors comment:  Elderly      Past Medical History:  Diagnosis Date  . 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   . Cancer ([place])    prostate ca 1999/removal/ rad tx  . Carotid artery occlusion   . Hyperlipidemia   . Hypertension   . Incontinence   . Reflux   . Renal disorder   . Status post implantation of artificial urinary sphincter     Patient Active Problem List   Diagnosis Date Noted  . Syncope 09/07/2018  . Acute lower UTI 09/07/2018  . ARF (acute renal failure) ([place]) 09/07/2018  . Gross hematuria 07/25/2018  . Sepsis due to urinary tract infection ([place]) 07/19/2018  . Paroxysmal A-fib ([place]) 07/19/2018  . Chronic kidney disease, stage IV (severe) ([place]) 07/19/2018  . Hyperlipidemia 08/05/2014  . Knee effusion, right 04/21/2012  . Essential hypertension 07/25/2011  . Anemia 08/27/2010  . CAROTID ARTERY DISEASE 06/14/2010  . CLOSED FRACTURE OF ACROMIAL END OF CLAVICLE 04/25/2010    Past Surgical History:    Procedure Laterality Date  . cataracts    . HERNIA REPAIR    . NEPHROSTOMY    . radical prostectomy    . URINARY SPHINCTER IMPLANT         Family History  Problem Relation Age of Onset  . Coronary artery disease Other        family history, male < 81  . Arthritis Other        family history  . Cancer Other   . Kidney disease Other     Social History   Tobacco Use  . Smoking status: Former Smoker    Packs/day: 2.00    Types: Cigarettes    Start date: 08/05/1962    Quit date: 12/02/1976    Years since quitting: 43.3  . Smokeless tobacco: Never Used  Substance Use Topics  . Alcohol use: No  . Drug use: No    Home Medications Prior to Admission medications   Medication Sig Start Date End Date Taking? Authorizing Provider  acetaminophen (TYLENOL) 500 MG tablet Take 1,000 mg by mouth every 8 (eight) hours as needed for mild pain or moderate pain. For headache pain     [provider]  ALFALFA PO Take 250 mg by mouth 2 (two) times daily.     [provider]  allopurinol (ZYLOPRIM) 100 MG tablet TK 1 T PO ONCE D 10/07/18   [provider]  ALPRAZolam (XANAX) 0.5 MG tablet TK 1  T PO  BID 10/08/18   [provider]  amLODipine (NORVASC) 5 MG tablet Take 5 mg by mouth daily.    [provider]  ARIPiprazole (ABILIFY) 20 MG tablet Take 20 mg by mouth daily. 03/27/20   [provider]  ceFEPIme 1 g in sodium chloride 0.9 % 100 mL Inject 1 g into the vein daily. 09/10/18   [name], MD  epoetin alfa (EPOGEN) 3000 UNIT/ML injection Inject 3,000 Units into the vein every 14 (fourteen) days.    [provider]  fish oil-omega-3 fatty acids 1000 MG capsule Take 1 g by mouth daily.     [provider]  lansoprazole (PREVACID) 15 MG capsule  08/24/10   [provider]  meclizine (ANTIVERT) 12.5 MG tablet Take 12.5 mg by mouth daily.    [provider]  Multiple Vitamin (MULTIVITAMIN WITH MINERALS)  TABS tablet Take 1 tablet by mouth daily.    [provider]  ofloxacin (FLOXIN) 0.3 % OTIC solution PUT 5 DROPS TWICE DAILY INTO AFFECTED EAR 10/19/18   [provider]  Probiotic Product (RISA-BID PROBIOTIC) TABS Give 1 tablet by mouth twice a day from 09/14/2018-09/24/2018    [provider]  rosuvastatin (CRESTOR) 10 MG tablet Take 10 mg by mouth daily.     [provider]  sertraline (ZOLOFT) 100 MG tablet Take 100 mg by mouth daily.    [provider]  sodium bicarbonate 650 MG tablet TK 1 T PO  TID 10/13/18   [provider]    Allergies    Ivp dye [iodinated diagnostic agents], Nalbuphine, and Codeine  Review of Systems   Review of Systems  Constitutional: Negative for fever and weight loss.  Cardiovascular: Negative for chest pain.  Gastrointestinal: Negative for abdominal pain and bowel incontinence.  Genitourinary: Negative for bladder incontinence, dysuria and pelvic pain.  Musculoskeletal: Positive for back pain.  Neurological: Negative for tingling, weakness, numbness, headaches and paresthesias.    Physical Exam Updated Vital Signs BP (!) 160/63 (BP Location: Right Arm)   Pulse 77   Temp 97.6 F (36.4 C) (Oral)   Resp 18   Ht 5\' 8"  (1.727 m)   Wt 86 kg   SpO2 100%   BMI 28.83 kg/m   Physical Exam Vitals and nursing note reviewed.  Constitutional:      General: He is not in acute distress.    Appearance: He is well-developed. He is not diaphoretic.  HENT:     Head: Normocephalic and atraumatic.  Eyes:     General: No scleral icterus.    Conjunctiva/sclera: Conjunctivae normal.  Cardiovascular:     Rate and Rhythm: Normal rate and regular rhythm.     Heart sounds: Normal heart sounds.  Pulmonary:     Effort: Pulmonary effort is normal. No respiratory distress.     Breath sounds: Normal breath sounds.  Abdominal:     Palpations: Abdomen is soft.     Tenderness: There is no abdominal tenderness.    Musculoskeletal:     Cervical back: Normal range of motion and neck supple. No bony tenderness.     Thoracic back: No bony tenderness.     Lumbar back: Tenderness (Right) present. No edema, spasms or bony tenderness. Decreased range of motion. Negative right straight leg raise test and negative left straight leg raise test.  Skin:    General: Skin is warm and dry.  Neurological:     Mental Status: He is alert.  Psychiatric:  Behavior: Behavior normal.     ED Results / Procedures / Treatments   Labs (all labs ordered are listed, but only abnormal results are displayed) Labs Reviewed - No data to display  EKG None  Radiology No results found.  Procedures Procedures (including critical care time)  Medications Ordered in ED Medications - No data to display  ED Course  I have reviewed the triage vital signs and the nursing notes.  Pertinent labs & imaging results that were available during my care of the patient were reviewed by me and considered in my medical decision making (see chart for details).    MDM Rules/Calculators/A&P                      86 year old male who presents the emergency department with back pain after lifting a heavy object 1 week ago.  The emergent differential diagnosis for back pain includes but is not limited to fracture, muscle strain, cauda equina, spinal stenosis. DDD, ankylosing spondylitis, acute ligamentous injury, disk herniation, spondylolisthesis, Epidural compression syndrome, metastatic cancer, transverse myelitis, vertebral osteomyelitis, diskitis, kidney stone, pyelonephritis, AAA, Perforated ulcer, Retrocecal appendicitis, pancreatitis, bowel obstruction, retroperitoneal hemorrhage or mass, meningitis. Patient's range of motion is limited with forward flexion, changing positions.  I personally ordered and reviewed images of the lumbar film which shows a compression fracture at L3 with only mild height loss of indeterminate age.  I  suspect that this occurred about a week ago when the patient initially injured his back.  Patient seen and shared visit with Dr. Tomi Bamberger.  He has no red flag symptoms.  Plan to place the patient in TLSO back brace with outpatient neurosurgical follow-up.  He appears otherwise appropriate for discharge pending splint placement.   Patient fitted with TLSO splint.  He has significant pressure and pain relief already.  Discussed home use.  Gave him outpatient follow-up with Dr. Arnoldo Morale.  Discussed return precautions.  He appears otherwise appropriate for discharge at this time.  He is ambulatory.   Final Clinical Impression(s) / ED Diagnoses Final diagnoses:  None    Rx / DC Orders ED Discharge Orders    None       Margarita Mail, PA-C 04/13/20 2103    Dorie Rank, MD 04/14/20 [phone]

## 2020-04-13 NOTE — Discharge Instructions (Addendum)
Continue to take Tylenol for pain. Wear your brace at all times during the day. You may wear it at night if it is comfortable  You may remove there brace to bathe .  Please make a follow up appointment with Dr. Arnoldo Morale as soon as possible.  Contact a health care provider if: You have a fever. Your pain medicine is not helping. Your pain does not get better over time. You cannot return to your normal activities as planned or expected. Get help right away if: You have difficulty breathing. Your pain is very bad and it suddenly gets worse. You have numbness, tingling, or weakness in any part of your body. You are unable to empty your bladder. You cannot control your bladder or bowels. You are unable to move any body part (paralysis) that is below the level of your injury. You vomit. You have pain in your abdomen.

## 2020-04-13 NOTE — ED Triage Notes (Signed)
Pt reports was picking up a case of water while at [place] and reports back pain ever since. Pt denies any gi/gu symptoms.

## 2020-04-13 NOTE — Progress Notes (Signed)
Orthopedic Tech Progress Note Patient Details:  [name] [name] 1934-08-28 [id number] Called in order to [facility] for a STAT TLSO  Patient ID: [name] [name], male   DOB: October 24, 1934, 86 y.o.   MRN: [id number]   Janit Pagan 04/13/2020, 2:37 PM

## 2020-04-18 ENCOUNTER — Encounter (HOSPITAL_COMMUNITY): Payer: Self-pay

## 2020-04-18 ENCOUNTER — Other Ambulatory Visit: Payer: Self-pay

## 2020-04-18 ENCOUNTER — Encounter (HOSPITAL_COMMUNITY)
Admission: RE | Admit: 2020-04-18 | Discharge: 2020-04-18 | Disposition: A | Discharge status: Home / Self Care | Payer: MEDICARE | Source: Ambulatory Visit | Attending: Nephrology | Admitting: Nephrology

## 2020-04-18 DIAGNOSIS — N184 Chronic kidney disease, stage 4 (severe): Secondary | ICD-10-CM | POA: Diagnosis not present

## 2020-04-18 LAB — POCT HEMOGLOBIN-HEMACUE: Hemoglobin: 10.7 g/dL — ABNORMAL LOW (ref 13.0–17.0)

## 2020-04-18 MED ORDER — EPOETIN ALFA 3000 UNIT/ML IJ SOLN: 3000.0000 [IU] | Freq: Once | INTRAMUSCULAR | Status: DC

## 2020-04-27 DIAGNOSIS — K5900 Constipation, unspecified: Secondary | ICD-10-CM | POA: Diagnosis not present

## 2020-04-27 DIAGNOSIS — I25119 Atherosclerotic heart disease of native coronary artery with unspecified angina pectoris: Secondary | ICD-10-CM | POA: Diagnosis not present

## 2020-04-27 DIAGNOSIS — R42 Dizziness and giddiness: Secondary | ICD-10-CM | POA: Diagnosis not present

## 2020-04-27 DIAGNOSIS — K219 Gastro-esophageal reflux disease without esophagitis: Secondary | ICD-10-CM | POA: Diagnosis not present

## 2020-04-27 DIAGNOSIS — F331 Major depressive disorder, recurrent, moderate: Secondary | ICD-10-CM | POA: Diagnosis not present

## 2020-04-27 DIAGNOSIS — G4700 Insomnia, unspecified: Secondary | ICD-10-CM | POA: Diagnosis not present

## 2020-04-27 DIAGNOSIS — I10 Essential (primary) hypertension: Secondary | ICD-10-CM | POA: Diagnosis not present

## 2020-04-27 DIAGNOSIS — M1A00X0 Idiopathic chronic gout, unspecified site, without tophus (tophi): Secondary | ICD-10-CM | POA: Diagnosis not present

## 2020-04-27 DIAGNOSIS — N185 Chronic kidney disease, stage 5: Secondary | ICD-10-CM | POA: Diagnosis not present

## 2020-04-27 DIAGNOSIS — F419 Anxiety disorder, unspecified: Secondary | ICD-10-CM | POA: Diagnosis not present

## 2020-04-27 DIAGNOSIS — E785 Hyperlipidemia, unspecified: Secondary | ICD-10-CM | POA: Diagnosis not present

## 2020-04-27 DIAGNOSIS — Z0189 Encounter for other specified special examinations: Secondary | ICD-10-CM | POA: Diagnosis not present

## 2020-05-02 ENCOUNTER — Ambulatory Visit (HOSPITAL_COMMUNITY): Payer: MEDICARE

## 2020-05-02 ENCOUNTER — Other Ambulatory Visit (HOSPITAL_COMMUNITY): Payer: MEDICARE

## 2020-05-02 DIAGNOSIS — K219 Gastro-esophageal reflux disease without esophagitis: Secondary | ICD-10-CM | POA: Diagnosis not present

## 2020-05-02 DIAGNOSIS — G4700 Insomnia, unspecified: Secondary | ICD-10-CM | POA: Diagnosis not present

## 2020-05-02 DIAGNOSIS — R42 Dizziness and giddiness: Secondary | ICD-10-CM | POA: Diagnosis not present

## 2020-05-02 DIAGNOSIS — K5900 Constipation, unspecified: Secondary | ICD-10-CM | POA: Diagnosis not present

## 2020-05-02 DIAGNOSIS — F419 Anxiety disorder, unspecified: Secondary | ICD-10-CM | POA: Diagnosis not present

## 2020-05-02 DIAGNOSIS — N185 Chronic kidney disease, stage 5: Secondary | ICD-10-CM | POA: Diagnosis not present

## 2020-05-02 DIAGNOSIS — I120 Hypertensive chronic kidney disease with stage 5 chronic kidney disease or end stage renal disease: Secondary | ICD-10-CM | POA: Diagnosis not present

## 2020-05-02 DIAGNOSIS — E785 Hyperlipidemia, unspecified: Secondary | ICD-10-CM | POA: Diagnosis not present

## 2020-05-02 DIAGNOSIS — F331 Major depressive disorder, recurrent, moderate: Secondary | ICD-10-CM | POA: Diagnosis not present

## 2020-05-02 DIAGNOSIS — M1A00X0 Idiopathic chronic gout, unspecified site, without tophus (tophi): Secondary | ICD-10-CM | POA: Diagnosis not present

## 2020-05-02 DIAGNOSIS — Z0001 Encounter for general adult medical examination with abnormal findings: Secondary | ICD-10-CM | POA: Diagnosis not present

## 2020-05-02 DIAGNOSIS — I25119 Atherosclerotic heart disease of native coronary artery with unspecified angina pectoris: Secondary | ICD-10-CM | POA: Diagnosis not present

## 2020-05-02 DIAGNOSIS — D631 Anemia in chronic kidney disease: Secondary | ICD-10-CM | POA: Diagnosis not present

## 2020-05-03 ENCOUNTER — Other Ambulatory Visit: Payer: Self-pay

## 2020-05-03 ENCOUNTER — Encounter (HOSPITAL_COMMUNITY)
Admission: RE | Admit: 2020-05-03 | Discharge: 2020-05-03 | Disposition: A | Discharge status: Home / Self Care | Payer: MEDICARE | Source: Ambulatory Visit | Attending: Nephrology | Admitting: Nephrology

## 2020-05-03 ENCOUNTER — Encounter (HOSPITAL_COMMUNITY): Payer: Self-pay

## 2020-05-03 DIAGNOSIS — D631 Anemia in chronic kidney disease: Secondary | ICD-10-CM | POA: Insufficient documentation

## 2020-05-03 DIAGNOSIS — N184 Chronic kidney disease, stage 4 (severe): Secondary | ICD-10-CM | POA: Insufficient documentation

## 2020-05-03 LAB — RENAL FUNCTION PANEL
Albumin: 4 g/dL (ref 3.5–5.0)
Anion gap: 12 (ref 5–15)
BUN: 58 mg/dL — ABNORMAL HIGH (ref 8–23)
CO2: 21 mmol/L — ABNORMAL LOW (ref 22–32)
Calcium: 8.9 mg/dL (ref 8.9–10.3)
Chloride: 99 mmol/L (ref 98–111)
Creatinine, Ser: 3.79 mg/dL — ABNORMAL HIGH (ref 0.61–1.24)
GFR calc Af Amer: 16 mL/min — ABNORMAL LOW (ref >60)
GFR calc non Af Amer: 14 mL/min — ABNORMAL LOW (ref >60)
Glucose, Bld: 98 mg/dL (ref 70–99)
Phosphorus: 4.4 mg/dL (ref 2.5–4.6)
Potassium: 4.7 mmol/L (ref 3.5–5.1)
Sodium: 132 mmol/L — ABNORMAL LOW (ref 135–145)

## 2020-05-03 LAB — CBC
HCT: 29.6 % — ABNORMAL LOW (ref 39.0–52.0)
Hemoglobin: 9.8 g/dL — ABNORMAL LOW (ref 13.0–17.0)
MCH: 31.5 pg (ref 26.0–34.0)
MCHC: 33.1 g/dL (ref 30.0–36.0)
MCV: 95.2 fL (ref 80.0–100.0)
Platelets: 125 10*3/uL — ABNORMAL LOW (ref 150–400)
RBC: 3.11 MIL/uL — ABNORMAL LOW (ref 4.22–5.81)
RDW: 15.1 % (ref 11.5–15.5)
WBC: 6.9 10*3/uL (ref 4.0–10.5)
nRBC: 0 % (ref 0.0–0.2)

## 2020-05-03 LAB — IRON AND TIBC
Iron: 86 ug/dL (ref 45–182)
Saturation Ratios: 32 % (ref 17.9–39.5)
TIBC: 271 ug/dL (ref 250–450)
UIBC: 185 ug/dL

## 2020-05-03 LAB — POCT HEMOGLOBIN-HEMACUE: Hemoglobin: 9.9 g/dL — ABNORMAL LOW (ref 13.0–17.0)

## 2020-05-03 LAB — VITAMIN D 25 HYDROXY (VIT D DEFICIENCY, FRACTURES): Vit D, 25-Hydroxy: 42.9 ng/mL (ref 30–100)

## 2020-05-03 LAB — FERRITIN: Ferritin: 312 ng/mL (ref 24–336)

## 2020-05-03 MED ORDER — EPOETIN ALFA 3000 UNIT/ML IJ SOLN
3000.0000 [IU] | Freq: Once | INTRAMUSCULAR | Status: AC
  Administered 2020-05-03: 3000 [IU] via SUBCUTANEOUS

## 2020-05-04 LAB — PARATHYROID HORMONE, INTACT (NO CA): PTH: 105 pg/mL — ABNORMAL HIGH (ref 15–65)

## 2020-05-10 DIAGNOSIS — R809 Proteinuria, unspecified: Secondary | ICD-10-CM | POA: Diagnosis not present

## 2020-05-10 DIAGNOSIS — N189 Chronic kidney disease, unspecified: Secondary | ICD-10-CM | POA: Diagnosis not present

## 2020-05-10 DIAGNOSIS — R21 Rash and other nonspecific skin eruption: Secondary | ICD-10-CM | POA: Diagnosis not present

## 2020-05-10 DIAGNOSIS — N185 Chronic kidney disease, stage 5: Secondary | ICD-10-CM | POA: Diagnosis not present

## 2020-05-10 DIAGNOSIS — E211 Secondary hyperparathyroidism, not elsewhere classified: Secondary | ICD-10-CM | POA: Diagnosis not present

## 2020-05-10 DIAGNOSIS — D631 Anemia in chronic kidney disease: Secondary | ICD-10-CM | POA: Diagnosis not present

## 2020-05-10 DIAGNOSIS — E871 Hypo-osmolality and hyponatremia: Secondary | ICD-10-CM | POA: Diagnosis not present

## 2020-05-10 DIAGNOSIS — I129 Hypertensive chronic kidney disease with stage 1 through stage 4 chronic kidney disease, or unspecified chronic kidney disease: Secondary | ICD-10-CM | POA: Diagnosis not present

## 2020-05-12 ENCOUNTER — Other Ambulatory Visit (HOSPITAL_COMMUNITY)
Admission: RE | Admit: 2020-05-12 | Discharge: 2020-05-12 | Disposition: A | Discharge status: Home / Self Care | Payer: MEDICARE | Source: Ambulatory Visit | Attending: Vascular Surgery | Admitting: Vascular Surgery

## 2020-05-12 ENCOUNTER — Encounter (HOSPITAL_COMMUNITY): Payer: Self-pay | Admitting: Vascular Surgery

## 2020-05-12 ENCOUNTER — Other Ambulatory Visit: Payer: Self-pay

## 2020-05-12 ENCOUNTER — Other Ambulatory Visit (HOSPITAL_COMMUNITY): Payer: MEDICARE

## 2020-05-12 DIAGNOSIS — Z20822 Contact with and (suspected) exposure to covid-19: Secondary | ICD-10-CM | POA: Diagnosis not present

## 2020-05-12 DIAGNOSIS — Z01812 Encounter for preprocedural laboratory examination: Secondary | ICD-10-CM | POA: Insufficient documentation

## 2020-05-12 NOTE — Progress Notes (Signed)
   05/12/20 1056  OBSTRUCTIVE SLEEP APNEA  Have you ever been diagnosed with sleep apnea through a sleep study? No  Do you snore loudly (loud enough to be heard through closed doors)?  1  Do you often feel tired, fatigued, or sleepy during the daytime (such as falling asleep during driving or talking to someone)? 0  Has anyone observed you stop breathing during your sleep? 1  Do you have, or are you being treated for high blood pressure? 1  BMI more than 35 kg/m2? 0  Age > 50 (1-yes) 1  Neck circumference greater than:Male 16 inches or larger, Male 17inches or larger? 1  Male Gender (Yes=1) 1  Obstructive Sleep Apnea Score 6

## 2020-05-12 NOTE — Anesthesia Preprocedure Evaluation (Addendum)
Anesthesia Evaluation  Patient identified by MRN, date of birth, ID band Patient awake    Reviewed: Allergy & Precautions, H&P , NPO status , Patient's Chart, lab work & pertinent test results  Airway Mallampati: II   Neck ROM: full    Dental   Pulmonary former smoker,    breath sounds clear to auscultation       Cardiovascular hypertension, + CAD and + Cardiac Stents   Rhythm:regular Rate:Normal     Neuro/Psych PSYCHIATRIC DISORDERS Anxiety Depression    GI/Hepatic   Endo/Other    Renal/GU Renal InsufficiencyRenal diseasestones     Musculoskeletal   Abdominal   Peds  Hematology  (+) Blood dyscrasia, anemia ,   Anesthesia Other Findings   Reproductive/Obstetrics                            Anesthesia Physical Anesthesia Plan  ASA: III  Anesthesia Plan: MAC   Post-op Pain Management:    Induction: Intravenous  PONV Risk Score and Plan: 1 and Propofol infusion, Ondansetron and Treatment may vary due to age or medical condition  Airway Management Planned: Simple Face Mask  Additional Equipment:   Intra-op Plan:   Post-operative Plan:   Informed Consent: I have reviewed the patients History and Physical, chart, labs and discussed the procedure including the risks, benefits and alternatives for the proposed anesthesia with the patient or authorized representative who has indicated his/her understanding and acceptance.       Plan Discussed with: CRNA, Anesthesiologist and Surgeon  Anesthesia Plan Comments: (PAT note by Karoline Caldwell, PA-C: Hx of CAD s/p stent to mid LAD in 2004. Last seen by cardiology Dr. Bronson Ing 08/05/14. At that time noted to be stable from CAD standpoint. 39yrfollowup was recommended but pt did not followup, Instead continued with PCP for management.  Most recent echo 09/08/18 showed EF 55-60%, normal wall motion, normal diastolic function, mild-mod AV  calcification without stenosis or regurgitation.   Last seen by PCP Dr. ZWende Neighbors6/12/2019 for complete physical exam.  Per note, no issues from CAD standpoint.  Discussed that he was nearing need for hemodialysis.  Stage 5 CKD followed by Dr. BTheador Hawthorne Associated anemia. Receives epogen and feraheme.  Will need day of surgery labs and eval.  TTE 09/08/18: - Left ventricle: The cavity size was normal. Wall thickness was  increased in a pattern of mild LVH. Systolic function was normal.  The estimated ejection fraction was in the range of 55% to 60%.  Wall motion was normal; there were no regional wall motion  abnormalities. Left ventricular diastolic function parameters  were normal.  - Aortic valve: Mildly to moderately calcified annulus. Trileaflet.  There was no stenosis. Mean gradient (S): 7 mm Hg. Peak gradient  (S): 15 mm Hg. Valve area (VTI): 2.48 cm^2.  - Mitral valve: There was trivial regurgitation.  - Right atrium: Central venous pressure (est): 3 mm Hg.  - Atrial septum: No defect or patent foramen ovale was identified.  - Tricuspid valve: There was mild regurgitation.  - Pulmonary arteries: PA peak pressure: 34 mm Hg (S).  - Pericardium, extracardiac: There was no pericardial effusion.   Carotid [place]/8/19:  IMPRESSION: 1. Minimal to moderate amount of right-sided atherosclerotic plaque results in elevated peak systolic velocities within the right internal carotid artery compatible with the 50-69% luminal narrowing range. Further evaluation with CTA could performed as clinically indicated. 2. Minimal to moderate amount of left-sided  atherosclerotic plaque, not definitely resulting in a hemodynamically significant stenosis. )       Anesthesia Quick Evaluation

## 2020-05-12 NOTE — Progress Notes (Signed)
Anesthesia Chart Review: Same-day work-up  Hx of CAD s/p stent to mid LAD in 2004. Last seen by cardiology Dr. Bronson Ing 08/05/14. At that time noted to be stable from CAD standpoint. 59yr followup was recommended but pt did not followup, Instead continued with PCP for management.  Most recent echo 09/08/18 showed EF 55-60%, normal wall motion, normal diastolic function, mild-mod AV calcification without stenosis or regurgitation.   Last seen by PCP Dr. Wende Neighbors 05/02/2020 for complete physical exam.  Per note, no issues from CAD standpoint.  Discussed that he was nearing need for hemodialysis.  Stage 5 CKD followed by Dr. Theador Hawthorne. Associated anemia. Receives epogen and feraheme.  Will need day of surgery labs and eval.  TTE 09/08/18: - Left ventricle: The cavity size was normal. Wall thickness was  increased in a pattern of mild LVH. Systolic function was normal.  The estimated ejection fraction was in the range of 55% to 60%.  Wall motion was normal; there were no regional wall motion  abnormalities. Left ventricular diastolic function parameters  were normal.  - Aortic valve: Mildly to moderately calcified annulus. Trileaflet.  There was no stenosis. Mean gradient (S): 7 mm Hg. Peak gradient  (S): 15 mm Hg. Valve area (VTI): 2.48 cm^2.  - Mitral valve: There was trivial regurgitation.  - Right atrium: Central venous pressure (est): 3 mm Hg.  - Atrial septum: No defect or patent foramen ovale was identified.  - Tricuspid valve: There was mild regurgitation.  - Pulmonary arteries: PA peak pressure: 34 mm Hg (S).  - Pericardium, extracardiac: There was no pericardial effusion.   Carotid US 09/08/18:  IMPRESSION: 1. Minimal to moderate amount of right-sided atherosclerotic plaque results in elevated peak systolic velocities within the right internal carotid artery compatible with the 50-69% luminal narrowing range. Further evaluation with CTA could performed as  clinically indicated. 2. Minimal to moderate amount of left-sided atherosclerotic plaque, not definitely resulting in a hemodynamically significant stenosis.   Wynonia Musty [facility] Short Stay Center/Anesthesiology Phone [phone] 05/12/2020 3:44 PM

## 2020-05-12 NOTE — Progress Notes (Signed)
Pt denies SOB, chest pain, and being under the care of a cardiologist. Pt stated that PCP is Dr.John Z. [place] Crane. Pt stated that a cardiac cath was performed in  2000 or 2001 and a stress test was done " 5 years or more." Pt stated that he cannot recall who ordered the stress test. Pt denies having an EKG and chest x ray in the last year. Pt made aware to stop taking vitamins, fish oil, Alfalfa and herbal medications. Do not take any NSAIDs ie: Ibuprofen, Advil, Naproxen (Aleve), Motrin, BC and Goody Powder. Pt reminded to quarantine. Pt requested that pre-op instructions be given to caregiver, Anne Ng. Anne Ng verbalized understanding of all pre-op instructions. PA, Anesthesiology asked to review pt history.

## 2020-05-13 LAB — SARS CORONAVIRUS 2 (TAT 6-24 HRS): SARS Coronavirus 2: NEGATIVE

## 2020-05-15 ENCOUNTER — Ambulatory Visit (HOSPITAL_COMMUNITY): Payer: MEDICARE | Admitting: Physician Assistant

## 2020-05-15 ENCOUNTER — Ambulatory Visit (HOSPITAL_COMMUNITY)
Admission: RE | Admit: 2020-05-15 | Discharge: 2020-05-15 | Disposition: A | Discharge status: Home / Self Care | Payer: MEDICARE | Attending: Vascular Surgery | Admitting: Vascular Surgery

## 2020-05-15 ENCOUNTER — Other Ambulatory Visit: Payer: Self-pay

## 2020-05-15 ENCOUNTER — Encounter (HOSPITAL_COMMUNITY): Payer: Self-pay | Admitting: Vascular Surgery

## 2020-05-15 ENCOUNTER — Encounter (HOSPITAL_COMMUNITY)
Admission: RE | Disposition: A | Discharge status: Home / Self Care | Payer: Self-pay | Source: Home / Self Care | Attending: Vascular Surgery

## 2020-05-15 DIAGNOSIS — Z91041 Radiographic dye allergy status: Secondary | ICD-10-CM | POA: Diagnosis not present

## 2020-05-15 DIAGNOSIS — I2510 Atherosclerotic heart disease of native coronary artery without angina pectoris: Secondary | ICD-10-CM | POA: Diagnosis not present

## 2020-05-15 DIAGNOSIS — E785 Hyperlipidemia, unspecified: Secondary | ICD-10-CM | POA: Insufficient documentation

## 2020-05-15 DIAGNOSIS — F419 Anxiety disorder, unspecified: Secondary | ICD-10-CM | POA: Diagnosis not present

## 2020-05-15 DIAGNOSIS — Z8546 Personal history of malignant neoplasm of prostate: Secondary | ICD-10-CM | POA: Diagnosis not present

## 2020-05-15 DIAGNOSIS — Z960 Presence of urogenital implants: Secondary | ICD-10-CM | POA: Diagnosis not present

## 2020-05-15 DIAGNOSIS — I120 Hypertensive chronic kidney disease with stage 5 chronic kidney disease or end stage renal disease: Secondary | ICD-10-CM | POA: Diagnosis not present

## 2020-05-15 DIAGNOSIS — D631 Anemia in chronic kidney disease: Secondary | ICD-10-CM | POA: Diagnosis not present

## 2020-05-15 DIAGNOSIS — N184 Chronic kidney disease, stage 4 (severe): Secondary | ICD-10-CM | POA: Diagnosis not present

## 2020-05-15 DIAGNOSIS — K219 Gastro-esophageal reflux disease without esophagitis: Secondary | ICD-10-CM | POA: Diagnosis not present

## 2020-05-15 DIAGNOSIS — Z955 Presence of coronary angioplasty implant and graft: Secondary | ICD-10-CM | POA: Diagnosis not present

## 2020-05-15 DIAGNOSIS — F329 Major depressive disorder, single episode, unspecified: Secondary | ICD-10-CM | POA: Insufficient documentation

## 2020-05-15 DIAGNOSIS — N186 End stage renal disease: Secondary | ICD-10-CM | POA: Diagnosis not present

## 2020-05-15 DIAGNOSIS — Z87891 Personal history of nicotine dependence: Secondary | ICD-10-CM | POA: Diagnosis not present

## 2020-05-15 DIAGNOSIS — Z79899 Other long term (current) drug therapy: Secondary | ICD-10-CM | POA: Insufficient documentation

## 2020-05-15 DIAGNOSIS — Z885 Allergy status to narcotic agent status: Secondary | ICD-10-CM | POA: Diagnosis not present

## 2020-05-15 DIAGNOSIS — Z9079 Acquired absence of other genital organ(s): Secondary | ICD-10-CM | POA: Diagnosis not present

## 2020-05-15 DIAGNOSIS — I129 Hypertensive chronic kidney disease with stage 1 through stage 4 chronic kidney disease, or unspecified chronic kidney disease: Secondary | ICD-10-CM | POA: Diagnosis not present

## 2020-05-15 HISTORY — DX: Presence of dental prosthetic device (complete) (partial): Z97.2

## 2020-05-15 HISTORY — DX: Presence of spectacles and contact lenses: Z97.3

## 2020-05-15 HISTORY — DX: Acute embolism and thrombosis of unspecified deep veins of unspecified lower extremity: I82.409

## 2020-05-15 HISTORY — PX: AV FISTULA PLACEMENT: SHX1204

## 2020-05-15 HISTORY — DX: Anxiety disorder, unspecified: F41.9

## 2020-05-15 HISTORY — DX: Gout, unspecified: M10.9

## 2020-05-15 HISTORY — DX: Depression, unspecified: F32.A

## 2020-05-15 HISTORY — DX: Personal history of urinary calculi: Z87.442

## 2020-05-15 LAB — POCT I-STAT, CHEM 8
BUN: 60 mg/dL — ABNORMAL HIGH (ref 8–23)
Calcium, Ion: 1.11 mmol/L — ABNORMAL LOW (ref 1.15–1.40)
Chloride: 102 mmol/L (ref 98–111)
Creatinine, Ser: 4.7 mg/dL — ABNORMAL HIGH (ref 0.61–1.24)
Glucose, Bld: 93 mg/dL (ref 70–99)
HCT: 26 % — ABNORMAL LOW (ref 39.0–52.0)
Hemoglobin: 8.8 g/dL — ABNORMAL LOW (ref 13.0–17.0)
Potassium: 4.8 mmol/L (ref 3.5–5.1)
Sodium: 135 mmol/L (ref 135–145)
TCO2: 24 mmol/L (ref 22–32)

## 2020-05-15 LAB — SURGICAL PCR SCREEN
MRSA, PCR: POSITIVE — AB
Staphylococcus aureus: POSITIVE — AB

## 2020-05-15 SURGERY — ARTERIOVENOUS (AV) FISTULA CREATION
Anesthesia: Monitor Anesthesia Care | Laterality: Left

## 2020-05-15 MED ORDER — LIDOCAINE-EPINEPHRINE (PF) 1 %-1:200000 IJ SOLN
INTRAMUSCULAR | Status: AC
  Filled 2020-05-15: qty 30

## 2020-05-15 MED ORDER — HEPARIN SODIUM (PORCINE) 1000 UNIT/ML IJ SOLN
INTRAMUSCULAR | Status: DC | PRN
Start: 2020-05-15 — End: 2020-05-15
  Administered 2020-05-15: 7000 [IU] via INTRAVENOUS

## 2020-05-15 MED ORDER — HEPARIN SODIUM (PORCINE) 1000 UNIT/ML IJ SOLN
INTRAMUSCULAR | Status: AC
  Filled 2020-05-15: qty 1

## 2020-05-15 MED ORDER — 0.9 % SODIUM CHLORIDE (POUR BTL) OPTIME
TOPICAL | Status: DC | PRN
  Administered 2020-05-15: 1000 mL

## 2020-05-15 MED ORDER — PROPOFOL 1000 MG/100ML IV EMUL
INTRAVENOUS | Status: AC
  Filled 2020-05-15: qty 100

## 2020-05-15 MED ORDER — PROPOFOL 10 MG/ML IV BOLUS
INTRAVENOUS | Status: AC
  Filled 2020-05-15: qty 20

## 2020-05-15 MED ORDER — SODIUM CHLORIDE 0.9 % IV SOLN
INTRAVENOUS | Status: AC
  Filled 2020-05-15: qty 1.2

## 2020-05-15 MED ORDER — ONDANSETRON HCL 4 MG/2ML IJ SOLN
INTRAMUSCULAR | Status: DC | PRN
  Administered 2020-05-15: 4 mg via INTRAVENOUS

## 2020-05-15 MED ORDER — LIDOCAINE 2% (20 MG/ML) 5 ML SYRINGE
INTRAMUSCULAR | Status: AC
  Filled 2020-05-15: qty 5

## 2020-05-15 MED ORDER — TRAMADOL HCL 50 MG PO TABS: 50.0000 mg | ORAL_TABLET | Freq: Four times a day (QID) | ORAL | 0 refills | Status: DC | PRN

## 2020-05-15 MED ORDER — MIDAZOLAM HCL 2 MG/2ML IJ SOLN
INTRAMUSCULAR | Status: AC
  Filled 2020-05-15: qty 2

## 2020-05-15 MED ORDER — CEFAZOLIN SODIUM-DEXTROSE 2-4 GM/100ML-% IV SOLN
2.0000 g | INTRAVENOUS | Status: AC
  Administered 2020-05-15: 2 g via INTRAVENOUS
  Filled 2020-05-15: qty 100

## 2020-05-15 MED ORDER — PROTAMINE SULFATE 10 MG/ML IV SOLN
INTRAVENOUS | Status: AC
  Filled 2020-05-15: qty 5

## 2020-05-15 MED ORDER — THROMBIN (RECOMBINANT) 20000 UNITS EX SOLR
CUTANEOUS | Status: AC
  Filled 2020-05-15: qty 20000

## 2020-05-15 MED ORDER — CHLORHEXIDINE GLUCONATE 4 % EX LIQD: 60.0000 mL | Freq: Once | CUTANEOUS | Status: DC

## 2020-05-15 MED ORDER — LIDOCAINE 2% (20 MG/ML) 5 ML SYRINGE
INTRAMUSCULAR | Status: DC | PRN
Start: 2020-05-15 — End: 2020-05-15
  Administered 2020-05-15: 20 mg via INTRAVENOUS

## 2020-05-15 MED ORDER — LIDOCAINE HCL (PF) 1 % IJ SOLN
INTRAMUSCULAR | Status: AC
  Filled 2020-05-15: qty 30

## 2020-05-15 MED ORDER — MIDAZOLAM HCL 5 MG/5ML IJ SOLN
INTRAMUSCULAR | Status: DC | PRN
  Administered 2020-05-15: 1 mg via INTRAVENOUS

## 2020-05-15 MED ORDER — FENTANYL CITRATE (PF) 250 MCG/5ML IJ SOLN
INTRAMUSCULAR | Status: DC | PRN
  Administered 2020-05-15: 25 ug via INTRAVENOUS

## 2020-05-15 MED ORDER — PROTAMINE SULFATE 10 MG/ML IV SOLN
INTRAVENOUS | Status: DC | PRN
  Administered 2020-05-15: 40 mg via INTRAVENOUS

## 2020-05-15 MED ORDER — ONDANSETRON HCL 4 MG/2ML IJ SOLN
INTRAMUSCULAR | Status: AC
  Filled 2020-05-15: qty 2

## 2020-05-15 MED ORDER — SODIUM CHLORIDE 0.9 % IV SOLN
INTRAVENOUS | Status: DC | PRN
  Administered 2020-05-15: 500 mL

## 2020-05-15 MED ORDER — PROPOFOL 10 MG/ML IV BOLUS
INTRAVENOUS | Status: DC | PRN
  Administered 2020-05-15: 20 mg via INTRAVENOUS
  Administered 2020-05-15 (×2): 10 mg via INTRAVENOUS

## 2020-05-15 MED ORDER — ORAL CARE MOUTH RINSE: 15.0000 mL | Freq: Once | OROMUCOSAL | Status: AC

## 2020-05-15 MED ORDER — SODIUM CHLORIDE 0.9 % IV SOLN: INTRAVENOUS | Status: DC | PRN

## 2020-05-15 MED ORDER — SODIUM CHLORIDE 0.9 % IV SOLN: INTRAVENOUS | Status: DC

## 2020-05-15 MED ORDER — FENTANYL CITRATE (PF) 250 MCG/5ML IJ SOLN
INTRAMUSCULAR | Status: AC
  Filled 2020-05-15: qty 5

## 2020-05-15 MED ORDER — CHLORHEXIDINE GLUCONATE 0.12 % MT SOLN
15.0000 mL | Freq: Once | OROMUCOSAL | Status: AC
  Administered 2020-05-15: 15 mL via OROMUCOSAL
  Filled 2020-05-15: qty 15

## 2020-05-15 MED ORDER — LIDOCAINE-EPINEPHRINE (PF) 1 %-1:200000 IJ SOLN
INTRAMUSCULAR | Status: DC | PRN
  Administered 2020-05-15: 28 mL

## 2020-05-15 SURGICAL SUPPLY — 37 items
ADH SKN CLS APL DERMABOND .7 (GAUZE/BANDAGES/DRESSINGS) ×1
ARMBAND PINK RESTRICT EXTREMIT (MISCELLANEOUS) ×3 IMPLANT
CANISTER SUCT 3000ML PPV (MISCELLANEOUS) ×2 IMPLANT
CANNULA VESSEL 3MM 2 BLNT TIP (CANNULA) ×2 IMPLANT
CLIP VESOCCLUDE MED 6/CT (CLIP) ×2 IMPLANT
CLIP VESOCCLUDE SM WIDE 6/CT (CLIP) ×2 IMPLANT
COVER PROBE W GEL 5X96 (DRAPES) IMPLANT
COVER WAND RF STERILE (DRAPES) ×1 IMPLANT
DECANTER SPIKE VIAL GLASS SM (MISCELLANEOUS) ×2 IMPLANT
DERMABOND ADVANCED (GAUZE/BANDAGES/DRESSINGS) ×1
DERMABOND ADVANCED .7 DNX12 (GAUZE/BANDAGES/DRESSINGS) ×1 IMPLANT
ELECT REM PT RETURN 9FT ADLT (ELECTROSURGICAL) ×2
ELECTRODE REM PT RTRN 9FT ADLT (ELECTROSURGICAL) ×1 IMPLANT
GLOVE BIO SURGEON STRL SZ7.5 (GLOVE) ×2 IMPLANT
GLOVE BIOGEL PI IND STRL 6.5 (GLOVE) IMPLANT
GLOVE BIOGEL PI IND STRL 7.5 (GLOVE) IMPLANT
GLOVE BIOGEL PI IND STRL 8 (GLOVE) ×1 IMPLANT
GLOVE BIOGEL PI INDICATOR 6.5 (GLOVE) ×2
GLOVE BIOGEL PI INDICATOR 7.5 (GLOVE) ×1
GLOVE BIOGEL PI INDICATOR 8 (GLOVE) ×1
GOWN STRL REUS W/ TWL LRG LVL3 (GOWN DISPOSABLE) ×3 IMPLANT
GOWN STRL REUS W/TWL LRG LVL3 (GOWN DISPOSABLE) ×6
GRAFT GORETEX STRT 4-7X45 (Vascular Products) ×1 IMPLANT
KIT BASIN OR (CUSTOM PROCEDURE TRAY) ×2 IMPLANT
KIT TURNOVER KIT B (KITS) ×2 IMPLANT
NS IRRIG 1000ML POUR BTL (IV SOLUTION) ×2 IMPLANT
PACK CV ACCESS (CUSTOM PROCEDURE TRAY) ×2 IMPLANT
PAD ARMBOARD 7.5X6 YLW CONV (MISCELLANEOUS) ×4 IMPLANT
SPONGE SURGIFOAM ABS GEL 100 (HEMOSTASIS) IMPLANT
SUT PROLENE 6 0 BV (SUTURE) ×4 IMPLANT
SUT VIC AB 3-0 SH 27 (SUTURE) ×4
SUT VIC AB 3-0 SH 27X BRD (SUTURE) ×1 IMPLANT
SUT VIC AB 4-0 PS2 18 (SUTURE) ×1 IMPLANT
SUT VICRYL 4-0 PS2 18IN ABS (SUTURE) ×2 IMPLANT
TOWEL GREEN STERILE (TOWEL DISPOSABLE) ×2 IMPLANT
UNDERPAD 30X36 HEAVY ABSORB (UNDERPADS AND DIAPERS) ×2 IMPLANT
WATER STERILE IRR 1000ML POUR (IV SOLUTION) ×2 IMPLANT

## 2020-05-15 NOTE — Op Note (Signed)
    NAME: [name] [name]    MRN: [id number] DOB: Jul 10, 1934    DATE OF OPERATION: 05/15/2020  PREOP DIAGNOSIS:    Stage IV chronic kidney disease  POSTOP DIAGNOSIS:    Same  PROCEDURE:    Left forearm AV graft  SURGEON: Judeth Cornfield. Scot Dock, MD  ASSIST: Laurence Slate, PA  ANESTHESIA: Local with sedation  EBL: Minimal  INDICATIONS:    [name] [name] is a 86 y.o. male who is not yet on dialysis.  He presents for new access.  Based on his vein map he was not a candidate for a fistula.  FINDINGS:   3-1/2 mm brachial vein.  3 mm brachial artery.  The arterial aspect of the graft is tunneled along the lateral aspect of the forearm  TECHNIQUE:   The patient was taken to the operating room and sedated by anesthesia.  The left arm was prepped and draped in the usual sterile fashion.  After the skin was anesthetized with 1% lidocaine a longitudinal incision was made above the antecubital level and the brachial artery and adjacent brachial vein were dissected free.  Both appear to be adequate.  Using one distal counterincision, a 4-7 mm PTFE graft was then tunneled in a loop fashion in the forearm with the arterial aspect of the graft along the lateral aspect of the forearm.  The patient was then heparinized.  The brachial artery was clamped proximally and distally and a longitudinal arteriotomy was made.  A short segment of the 4 mm of the graft was excised, the graft spatulated and sewn end-to-side to the brachial artery using continuous 6-0 Prolene suture.  The graft then pulled the appropriate length for anastomosis to the brachial vein.  The vein was ligated distally and spatulated proximally.  The graft was cut to appropriate length spatulated and sewn into into the vein using 2 continuous 6-0 Prolene sutures.  At the completion was a good thrill in the graft.  There was a palpable radial pulse.  The heparin was partially reversed with protamine.  Each of the wounds was closed with a  deep layer of 3-0 Vicryl and the skin closed with 4-0 Vicryl.  Dermabond was applied.  The patient tolerated the procedure well and was transferred to the recovery room in stable condition.  All needle and sponge counts were correct.  [name], MD, FACS [facility]  DATE OF DICTATION:   05/15/2020

## 2020-05-15 NOTE — Anesthesia Postprocedure Evaluation (Signed)
Anesthesia Post Note  Patient: [name] [name]  Procedure(s) Performed: LEFT FOREARM  ARTERIOVENOUS (AV) GRAFT INSERTION (Left )     Patient location during evaluation: PACU Anesthesia Type: MAC Level of consciousness: awake and alert Pain management: pain level controlled Vital Signs Assessment: post-procedure vital signs reviewed and stable Respiratory status: spontaneous breathing, nonlabored ventilation, respiratory function stable and patient connected to nasal cannula oxygen Cardiovascular status: stable and blood pressure returned to baseline Postop Assessment: no apparent nausea or vomiting Anesthetic complications: no   No complications documented.  Last Vitals:  Vitals:   05/15/20 0955 05/15/20 1010  BP: (!) 132/51 (!) 123/58  Pulse: 60 66  Resp: 11 11  Temp:  36.6 C  SpO2: 94% 95%    Last Pain:  Vitals:   05/15/20 0955  TempSrc:   PainSc: 0-No pain                 Maximo Spratling S

## 2020-05-15 NOTE — Transfer of Care (Signed)
Immediate Anesthesia Transfer of Care Note  Patient: [name] [name]  Procedure(s) Performed: LEFT FOREARM  ARTERIOVENOUS (AV) GRAFT INSERTION (Left )  Patient Location: PACU  Anesthesia Type:MAC  Level of Consciousness: awake, patient cooperative and responds to stimulation  Airway & Oxygen Therapy: Patient Spontanous Breathing  Post-op Assessment: Report given to RN and Post -op Vital signs reviewed and stable  Post vital signs: Reviewed and stable  Last Vitals:  Vitals Value Taken Time  BP    Temp    Pulse    Resp    SpO2      Last Pain:  Vitals:   05/15/20 0630  TempSrc: Oral  PainSc:          Complications: No complications documented.

## 2020-05-15 NOTE — H&P (Signed)
REASON FOR VISIT:    For Left AVG  ASSESSMENT & PLAN:   STAGE IV CHRONIC KIDNEY DISEASE: Patient has stage IV chronic kidney disease and presents for evaluation for hemodialysis access.  Based on his vein map he does not appear to be a good candidate for a fistula.  For this reason I have recommended placement of a left arm AV graft.  We have discussed the indications of the procedure and the potential complications and all his questions were answered.  He is having iron treatments for anemia and would like to wait till mid June for the surgery which I think is reasonable.  Since replacing her graft the graft would be able to be cannulated 1 month postop.  His surgery has been scheduled for 05/15/2020.  [name] Mayo, MD Office: [phone]   HPI:   [name] [name] is a pleasant 86 y.o. male, who was referred for evaluation for hemodialysis access.  I have reviewed the notes from the referring office.  The patient was seen on 02/24/2020.  Patient has known chronic kidney disease secondary to hypertension.  GFR at that time was 15.  Previous ultrasound showed that the right kidney measured 9 cm in length and the left kidney measures 9-1/2 cm in length.   On my history, the patient has end-stage renal disease secondary hypertension.  He is right-handed.  He is not on dialysis.  He does not have a pacemaker.  He is not on blood thinners.  He denies any recent uremic symptoms.  Specifically he denies nausea, vomiting, fatigue, anorexia, or palpitations.      Past Medical History:  Diagnosis Date  . 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   . Cancer ([place])    prostate ca 1999/removal/ rad tx  . Carotid artery occlusion   . Hyperlipidemia   . Hypertension   . Incontinence   . Reflux   . Renal disorder   . Status post implantation of artificial urinary sphincter          Family History   Problem Relation Age of Onset  . Coronary artery disease Other        family history, male < 54  . Arthritis Other        family history  . Cancer Other   . Kidney disease Other     SOCIAL HISTORY: Social History        Socioeconomic History  . Marital status: Widowed    Spouse name: Not on file  . Number of children: Not on file  . Years of education: Not on file  . Highest education level: Not on file  Occupational History  . Occupation: retired  Tobacco Use  . Smoking status: Former Smoker    Packs/day: 2.00    Types: Cigarettes    Start date: 08/05/1962    Quit date: 12/02/1976    Years since quitting: 43.3  . Smokeless tobacco: Never Used  Substance and Sexual Activity  . Alcohol use: No  . Drug use: No  . Sexual activity: Not on file  Other Topics Concern  . Not on file  Social History Narrative  . Not on file   Social Determinants of Health      Financial Resource Strain:   . Difficulty of Paying Living Expenses:   Food Insecurity:   . Worried About Charity fundraiser in the Last Year:   . Ran  Out of Food in the Last Year:   Transportation Needs:   . Lack of Transportation (Medical):   Marland Kitchen Lack of Transportation (Non-Medical):   Physical Activity:   . Days of Exercise per Week:   . Minutes of Exercise per Session:   Stress:   . Feeling of Stress :   Social Connections:   . Frequency of Communication with Friends and Family:   . Frequency of Social Gatherings with Friends and Family:   . Attends Religious Services:   . Active Member of Clubs or Organizations:   . Attends Archivist Meetings:   Marland Kitchen Marital Status:   Intimate Partner Violence:   . Fear of Current or Ex-Partner:   . Emotionally Abused:   Marland Kitchen Physically Abused:   . Sexually Abused:          Allergies  Allergen Reactions  . Ivp Dye [Iodinated Diagnostic Agents]     Due to partial renal failure  . Nalbuphine Other (See Comments)    PATIENT  STATES IT MADE HIM FEEL LIKE HE WAS GOING TO DIE. NO SPECIFICS  . Codeine Anxiety          Current Outpatient Medications  Medication Sig Dispense Refill  . acetaminophen (TYLENOL) 500 MG tablet Take 1,000 mg by mouth every 8 (eight) hours as needed for mild pain or moderate pain. For headache pain     . ALFALFA PO Take 250 mg by mouth 2 (two) times daily.     Marland Kitchen allopurinol (ZYLOPRIM) 100 MG tablet TK 1 T PO ONCE D  0  . ALPRAZolam (XANAX) 0.5 MG tablet TK 1 T PO  BID  5  . amLODipine (NORVASC) 5 MG tablet Take 5 mg by mouth daily.    . ARIPiprazole (ABILIFY) 20 MG tablet Take 20 mg by mouth daily.    Marland Kitchen ceFEPIme 1 g in sodium chloride 0.9 % 100 mL Inject 1 g into the vein daily. 7 Dose 0  . epoetin alfa (EPOGEN) 3000 UNIT/ML injection Inject 3,000 Units into the vein every 14 (fourteen) days.    . fish oil-omega-3 fatty acids 1000 MG capsule Take 1 g by mouth daily.     . lansoprazole (PREVACID) 15 MG capsule     . meclizine (ANTIVERT) 12.5 MG tablet Take 12.5 mg by mouth daily.    . Multiple Vitamin (MULTIVITAMIN WITH MINERALS) TABS tablet Take 1 tablet by mouth daily.    Marland Kitchen ofloxacin (FLOXIN) 0.3 % OTIC solution PUT 5 DROPS TWICE DAILY INTO AFFECTED EAR  2  . Probiotic Product (RISA-BID PROBIOTIC) TABS Give 1 tablet by mouth twice a day from 09/14/2018-09/24/2018    . rosuvastatin (CRESTOR) 10 MG tablet Take 10 mg by mouth daily.     . sertraline (ZOLOFT) 100 MG tablet Take 100 mg by mouth daily.    . sodium bicarbonate 650 MG tablet TK 1 T PO  TID  1   No current facility-administered medications for this visit.    REVIEW OF SYSTEMS:  [X]  denotes positive finding, [ ]  denotes negative finding Cardiac  Comments:  Chest pain or chest pressure:    Shortness of breath upon exertion:    Short of breath when lying flat:    Irregular heart rhythm:        Vascular    Pain in calf, thigh, or hip brought on by ambulation:    Pain in feet  at night that wakes you up from your sleep:     Blood  clot in your veins:    Leg swelling:         Pulmonary    Oxygen at home:    Productive cough:     Wheezing:         Neurologic    Sudden weakness in arms or legs:     Sudden numbness in arms or legs:     Sudden onset of difficulty speaking or slurred speech:    Temporary loss of vision in one eye:     Problems with dizziness:         Gastrointestinal    Blood in stool:     Vomited blood:         Genitourinary    Burning when urinating:     Blood in urine:        Psychiatric    Major depression:         Hematologic    Bleeding problems:    Problems with blood clotting too easily:        Skin    Rashes or ulcers:        Constitutional    Fever or chills:     PHYSICAL EXAM:                                 Today's Vitals   05/15/20 0626 05/15/20 0630 05/15/20 0633  BP:  (!) 152/50   Pulse:  61   Resp:  17   Temp:  98.4 F (36.9 C)   TempSrc:  Oral   SpO2:  99%   Weight:   78.9 kg  Height:   5\' 8"  (1.727 m)  PainSc: 0-No pain     Body mass index is 26.46 kg/m.   GENERAL: The patient is a well-nourished male, in no acute distress. The vital signs are documented above. CARDIAC: There is a regular rate and rhythm.  VASCULAR: I do not detect carotid bruits. He has a palpable brachial and radial pulse bilaterally. He has bilateral lower extremity swelling. PULMONARY: There is good air exchange bilaterally without wheezing or rales. ABDOMEN: Soft and non-tender with normal pitched bowel sounds.  MUSCULOSKELETAL: There are no major deformities or cyanosis. NEUROLOGIC: No focal weakness or paresthesias are detected. SKIN: There are no ulcers or rashes noted. PSYCHIATRIC: The patient has a normal affect.  DATA:    UPPER EXTREMITY VEIN MAP: I have independently interpreted his upper extremity vein map  today.  On the right side the forearm and upper arm cephalic vein are small and likely not usable for a fistula.  The basilic vein is fairly small with diameters ranging from 0.2-0.44 cm.  On the left side there is chronic thrombus in the upper arm cephalic vein.  The forearm cephalic vein does not appear to be usable.  The basilic vein does not appear to be usable.  UPPER EXTREMITY ARTERIAL DUPLEX: I have independently interpreted his upper extremity arterial duplex scan.  On the right side there is a triphasic radial and ulnar waveform.  The brachial artery measures 0.57 cm in diameter.  On the left side there is a triphasic radial and ulnar waveform.  The brachial artery measures 0.55 cm in diameter.

## 2020-05-15 NOTE — Anesthesia Procedure Notes (Signed)
Procedure Name: MAC Performed by: Vallen Calabrese, CRNA Pre-anesthesia Checklist: Patient identified, Emergency Drugs available, Suction available and Patient being monitored Patient Re-evaluated:Patient Re-evaluated prior to induction Oxygen Delivery Method: Simple face mask       

## 2020-05-16 ENCOUNTER — Encounter (HOSPITAL_COMMUNITY): Payer: Self-pay | Admitting: Vascular Surgery

## 2020-05-17 ENCOUNTER — Encounter (HOSPITAL_COMMUNITY)
Admission: RE | Admit: 2020-05-17 | Discharge: 2020-05-17 | Disposition: A | Discharge status: Home / Self Care | Payer: MEDICARE | Source: Ambulatory Visit | Attending: Nephrology | Admitting: Nephrology

## 2020-05-17 ENCOUNTER — Other Ambulatory Visit: Payer: Self-pay

## 2020-05-17 ENCOUNTER — Encounter (HOSPITAL_COMMUNITY): Payer: Self-pay

## 2020-05-17 DIAGNOSIS — N184 Chronic kidney disease, stage 4 (severe): Secondary | ICD-10-CM | POA: Diagnosis not present

## 2020-05-17 LAB — PROTEIN / CREATININE RATIO, URINE
Creatinine, Urine: 179.95 mg/dL
Protein Creatinine Ratio: 0.79 mg/mg{Cre} — ABNORMAL HIGH (ref 0.00–0.15)
Total Protein, Urine: 143 mg/dL

## 2020-05-17 LAB — POCT HEMOGLOBIN-HEMACUE: Hemoglobin: 8.9 g/dL — ABNORMAL LOW (ref 13.0–17.0)

## 2020-05-17 MED ORDER — EPOETIN ALFA 3000 UNIT/ML IJ SOLN
3000.0000 [IU] | Freq: Once | INTRAMUSCULAR | Status: AC
  Administered 2020-05-17: 3000 [IU] via SUBCUTANEOUS
  Filled 2020-05-17: qty 1

## 2020-05-17 NOTE — Progress Notes (Signed)
Patient presented today for his [facility] appointment.  States having 10/10 pain in arm fistula was done in on Monday 05/15/20.  Image sent to Dr. Theador Hawthorne.  No fever, however arm is swollen and warm to the touch.  Severe bruising noted from above insertion site to wrist.  Sat 99% in that extremity and + radial pulse assessed.  Appointment made for in the am at 9 with [facility] for them to assess and treat.  Patient advised not to use arm and elevate as much as possible.

## 2020-05-18 ENCOUNTER — Ambulatory Visit (INDEPENDENT_AMBULATORY_CARE_PROVIDER_SITE_OTHER): Payer: Self-pay | Admitting: Physician Assistant

## 2020-05-18 ENCOUNTER — Other Ambulatory Visit: Payer: Self-pay

## 2020-05-18 VITALS — BP 122/62 | HR 72 | Temp 97.2°F | Resp 20 | Ht 68.0 in | Wt 192.2 lb

## 2020-05-18 DIAGNOSIS — N184 Chronic kidney disease, stage 4 (severe): Secondary | ICD-10-CM

## 2020-05-18 MED ORDER — HYDROCODONE-ACETAMINOPHEN 5-325 MG PO TABS: 1.0000 | ORAL_TABLET | Freq: Four times a day (QID) | ORAL | 0 refills | Status: DC | PRN

## 2020-05-18 NOTE — Progress Notes (Signed)
    Postoperative Access Visit   History of Present Illness   [name] [name] is a 86 y.o. year old male who presents for postoperative follow-up for: left forearm arteriovenous graft by Dr. Scot Dock (Date: 05/15/20).  He reports pain and swelling in L forearm.  Tramadol is not helping with post op pain.  He has redness and edema in L forearm.  He denies drainage, fevers, chills, N/V.  He is not yet on HD.   Physical Examination   Vitals:   05/18/20 0857  BP: 122/62  Pulse: 72  Resp: 20  Temp: (!) 97.2 F (36.2 C)  TempSrc: Temporal  SpO2: 96%  Weight: 192 lb 3.2 oz (87.2 kg)  Height: 5\' 8"  (1.727 m)   Body mass index is 29.22 kg/m.  left arm Incisions are healing well, palpable radial pulse, hand grip is 5/5, sensation in digits is intact, palpable thrill, bruit can be auscultated; no areas of fluctuance or drainage; some erythema overlying graft; path of graft is easily palpable throughout its course     Medical Decision Making   [name] [name] is a 86 y.o. year old male who presents s/p left forearm arteriovenous graft   Patent L forearm loop AVG without signs or symptoms of steal syndrome  No concern for infection at this time  I encouraged the patient to elevate his arm and exercise his hand when possible during the day  I prescribed #20 5/325mg  Norco to use instead of tramadol  He will follow up in 2 weeks to recheck; he will call/return to office sooner if symptoms worsen or if there is concern for infection   Dagoberto Ligas PA-C [facility] Office: [phone]  Clinic MD: Scot Dock

## 2020-05-26 DIAGNOSIS — M545 Low back pain, unspecified: Secondary | ICD-10-CM | POA: Insufficient documentation

## 2020-05-26 DIAGNOSIS — S32030A Wedge compression fracture of third lumbar vertebra, initial encounter for closed fracture: Secondary | ICD-10-CM | POA: Diagnosis not present

## 2020-05-28 ENCOUNTER — Emergency Department (HOSPITAL_COMMUNITY): Payer: MEDICARE

## 2020-05-28 ENCOUNTER — Encounter (HOSPITAL_COMMUNITY): Payer: Self-pay

## 2020-05-28 ENCOUNTER — Other Ambulatory Visit: Payer: Self-pay

## 2020-05-28 ENCOUNTER — Emergency Department (HOSPITAL_COMMUNITY)
Admission: EM | Admit: 2020-05-28 | Discharge: 2020-05-28 | Disposition: A | Discharge status: Home / Self Care | Payer: MEDICARE | Source: Home / Self Care | Attending: Emergency Medicine | Admitting: Emergency Medicine

## 2020-05-28 DIAGNOSIS — A4152 Sepsis due to Pseudomonas: Secondary | ICD-10-CM | POA: Diagnosis present

## 2020-05-28 DIAGNOSIS — D631 Anemia in chronic kidney disease: Secondary | ICD-10-CM | POA: Diagnosis present

## 2020-05-28 DIAGNOSIS — R69 Illness, unspecified: Secondary | ICD-10-CM | POA: Diagnosis not present

## 2020-05-28 DIAGNOSIS — R5381 Other malaise: Secondary | ICD-10-CM | POA: Diagnosis not present

## 2020-05-28 DIAGNOSIS — R1030 Lower abdominal pain, unspecified: Secondary | ICD-10-CM | POA: Diagnosis not present

## 2020-05-28 DIAGNOSIS — K219 Gastro-esophageal reflux disease without esophagitis: Secondary | ICD-10-CM | POA: Diagnosis present

## 2020-05-28 DIAGNOSIS — N12 Tubulo-interstitial nephritis, not specified as acute or chronic: Secondary | ICD-10-CM | POA: Diagnosis present

## 2020-05-28 DIAGNOSIS — I480 Paroxysmal atrial fibrillation: Secondary | ICD-10-CM | POA: Diagnosis present

## 2020-05-28 DIAGNOSIS — Z23 Encounter for immunization: Secondary | ICD-10-CM | POA: Diagnosis present

## 2020-05-28 DIAGNOSIS — R000 Tachycardia, unspecified: Secondary | ICD-10-CM | POA: Diagnosis not present

## 2020-05-28 DIAGNOSIS — I129 Hypertensive chronic kidney disease with stage 1 through stage 4 chronic kidney disease, or unspecified chronic kidney disease: Secondary | ICD-10-CM | POA: Insufficient documentation

## 2020-05-28 DIAGNOSIS — G9341 Metabolic encephalopathy: Secondary | ICD-10-CM | POA: Diagnosis present

## 2020-05-28 DIAGNOSIS — Z87891 Personal history of nicotine dependence: Secondary | ICD-10-CM | POA: Diagnosis not present

## 2020-05-28 DIAGNOSIS — R339 Retention of urine, unspecified: Secondary | ICD-10-CM

## 2020-05-28 DIAGNOSIS — R102 Pelvic and perineal pain: Secondary | ICD-10-CM

## 2020-05-28 DIAGNOSIS — Z87442 Personal history of urinary calculi: Secondary | ICD-10-CM | POA: Diagnosis not present

## 2020-05-28 DIAGNOSIS — Z9079 Acquired absence of other genital organ(s): Secondary | ICD-10-CM | POA: Diagnosis not present

## 2020-05-28 DIAGNOSIS — I10 Essential (primary) hypertension: Secondary | ICD-10-CM | POA: Diagnosis not present

## 2020-05-28 DIAGNOSIS — N136 Pyonephrosis: Secondary | ICD-10-CM | POA: Diagnosis present

## 2020-05-28 DIAGNOSIS — N186 End stage renal disease: Secondary | ICD-10-CM | POA: Diagnosis present

## 2020-05-28 DIAGNOSIS — Z79899 Other long term (current) drug therapy: Secondary | ICD-10-CM | POA: Insufficient documentation

## 2020-05-28 DIAGNOSIS — I4891 Unspecified atrial fibrillation: Secondary | ICD-10-CM | POA: Diagnosis not present

## 2020-05-28 DIAGNOSIS — F418 Other specified anxiety disorders: Secondary | ICD-10-CM | POA: Diagnosis present

## 2020-05-28 DIAGNOSIS — A419 Sepsis, unspecified organism: Secondary | ICD-10-CM | POA: Diagnosis not present

## 2020-05-28 DIAGNOSIS — E785 Hyperlipidemia, unspecified: Secondary | ICD-10-CM | POA: Diagnosis present

## 2020-05-28 DIAGNOSIS — R310 Gross hematuria: Secondary | ICD-10-CM

## 2020-05-28 DIAGNOSIS — Z20822 Contact with and (suspected) exposure to covid-19: Secondary | ICD-10-CM | POA: Diagnosis present

## 2020-05-28 DIAGNOSIS — N1330 Unspecified hydronephrosis: Secondary | ICD-10-CM | POA: Diagnosis not present

## 2020-05-28 DIAGNOSIS — Z596 Low income: Secondary | ICD-10-CM | POA: Diagnosis not present

## 2020-05-28 DIAGNOSIS — N184 Chronic kidney disease, stage 4 (severe): Secondary | ICD-10-CM | POA: Diagnosis not present

## 2020-05-28 DIAGNOSIS — Z885 Allergy status to narcotic agent status: Secondary | ICD-10-CM | POA: Diagnosis not present

## 2020-05-28 DIAGNOSIS — K4090 Unilateral inguinal hernia, without obstruction or gangrene, not specified as recurrent: Secondary | ICD-10-CM | POA: Diagnosis not present

## 2020-05-28 DIAGNOSIS — I2510 Atherosclerotic heart disease of native coronary artery without angina pectoris: Secondary | ICD-10-CM | POA: Diagnosis present

## 2020-05-28 DIAGNOSIS — G629 Polyneuropathy, unspecified: Secondary | ICD-10-CM | POA: Diagnosis present

## 2020-05-28 DIAGNOSIS — Y842 Radiological procedure and radiotherapy as the cause of abnormal reaction of the patient, or of later complication, without mention of misadventure at the time of the procedure: Secondary | ICD-10-CM | POA: Diagnosis present

## 2020-05-28 DIAGNOSIS — Z86718 Personal history of other venous thrombosis and embolism: Secondary | ICD-10-CM | POA: Diagnosis not present

## 2020-05-28 DIAGNOSIS — B965 Pseudomonas (aeruginosa) (mallei) (pseudomallei) as the cause of diseases classified elsewhere: Secondary | ICD-10-CM | POA: Diagnosis present

## 2020-05-28 DIAGNOSIS — I120 Hypertensive chronic kidney disease with stage 5 chronic kidney disease or end stage renal disease: Secondary | ICD-10-CM | POA: Diagnosis present

## 2020-05-28 DIAGNOSIS — Z66 Do not resuscitate: Secondary | ICD-10-CM | POA: Diagnosis present

## 2020-05-28 DIAGNOSIS — R739 Hyperglycemia, unspecified: Secondary | ICD-10-CM | POA: Diagnosis not present

## 2020-05-28 DIAGNOSIS — I700 Atherosclerosis of aorta: Secondary | ICD-10-CM | POA: Diagnosis not present

## 2020-05-28 DIAGNOSIS — Z9861 Coronary angioplasty status: Secondary | ICD-10-CM | POA: Insufficient documentation

## 2020-05-28 DIAGNOSIS — Z923 Personal history of irradiation: Secondary | ICD-10-CM | POA: Diagnosis not present

## 2020-05-28 DIAGNOSIS — Z7401 Bed confinement status: Secondary | ICD-10-CM | POA: Diagnosis not present

## 2020-05-28 LAB — CBC WITH DIFFERENTIAL/PLATELET
Abs Immature Granulocytes: 0.02 10*3/uL (ref 0.00–0.07)
Basophils Absolute: 0 10*3/uL (ref 0.0–0.1)
Basophils Relative: 1 %
Eosinophils Absolute: 0.4 10*3/uL (ref 0.0–0.5)
Eosinophils Relative: 6 %
HCT: 26.2 % — ABNORMAL LOW (ref 39.0–52.0)
Hemoglobin: 8.8 g/dL — ABNORMAL LOW (ref 13.0–17.0)
Immature Granulocytes: 0 %
Lymphocytes Relative: 13 %
Lymphs Abs: 0.8 10*3/uL (ref 0.7–4.0)
MCH: 32.4 pg (ref 26.0–34.0)
MCHC: 33.6 g/dL (ref 30.0–36.0)
MCV: 96.3 fL (ref 80.0–100.0)
Monocytes Absolute: 0.4 10*3/uL (ref 0.1–1.0)
Monocytes Relative: 7 %
Neutro Abs: 4.4 10*3/uL (ref 1.7–7.7)
Neutrophils Relative %: 73 %
Platelets: 138 10*3/uL — ABNORMAL LOW (ref 150–400)
RBC: 2.72 MIL/uL — ABNORMAL LOW (ref 4.22–5.81)
RDW: 14.6 % (ref 11.5–15.5)
WBC: 6 10*3/uL (ref 4.0–10.5)
nRBC: 0 % (ref 0.0–0.2)

## 2020-05-28 LAB — URINALYSIS, ROUTINE W REFLEX MICROSCOPIC
Bilirubin Urine: NEGATIVE
Glucose, UA: NEGATIVE mg/dL
Ketones, ur: NEGATIVE mg/dL
Leukocytes,Ua: NEGATIVE
Nitrite: NEGATIVE
Protein, ur: 100 mg/dL — AB
RBC / HPF: >50 RBC/hpf — ABNORMAL HIGH (ref 0–5)
Specific Gravity, Urine: 1.008 (ref 1.005–1.030)
pH: 6 (ref 5.0–8.0)

## 2020-05-28 LAB — BASIC METABOLIC PANEL
Anion gap: 14 (ref 5–15)
BUN: 54 mg/dL — ABNORMAL HIGH (ref 8–23)
CO2: 21 mmol/L — ABNORMAL LOW (ref 22–32)
Calcium: 8.5 mg/dL — ABNORMAL LOW (ref 8.9–10.3)
Chloride: 96 mmol/L — ABNORMAL LOW (ref 98–111)
Creatinine, Ser: 3.73 mg/dL — ABNORMAL HIGH (ref 0.61–1.24)
GFR calc Af Amer: 16 mL/min — ABNORMAL LOW (ref >60)
GFR calc non Af Amer: 14 mL/min — ABNORMAL LOW (ref >60)
Glucose, Bld: 111 mg/dL — ABNORMAL HIGH (ref 70–99)
Potassium: 4.5 mmol/L (ref 3.5–5.1)
Sodium: 131 mmol/L — ABNORMAL LOW (ref 135–145)

## 2020-05-28 MED ORDER — CEPHALEXIN 500 MG PO CAPS
500.0000 mg | ORAL_CAPSULE | Freq: Three times a day (TID) | ORAL | 0 refills | Status: DC
Start: 2020-05-28 — End: 2020-05-28

## 2020-05-28 MED ORDER — CEPHALEXIN 500 MG PO CAPS
1000.0000 mg | ORAL_CAPSULE | Freq: Once | ORAL | Status: AC
  Administered 2020-05-28: 1000 mg via ORAL
  Filled 2020-05-28: qty 2

## 2020-05-28 MED ORDER — CEPHALEXIN 500 MG PO CAPS
500.0000 mg | ORAL_CAPSULE | Freq: Three times a day (TID) | ORAL | 0 refills | Status: DC
Start: 2020-05-28 — End: 2020-05-29

## 2020-05-28 NOTE — ED Provider Notes (Signed)
[facility] EMERGENCY DEPARTMENT Provider Note   CSN: [id number] Arrival date & time: 05/28/20  1608     History Chief Complaint  Patient presents with  . Hematuria    [name] [name] is a 86 y.o. male.  HPI  Patient is an 86 year old gentleman with a past medical history significant for prostatic enlargement secondary to cancer with prostatectomy and radiation approximately 20 years ago.  History of kidney stones, HTN, HLD, CKD 4 currently being considered for dialysis recently had left upper extremity fistula placed.  Patient has had notable surgeries including S/P Removal of artificial urinary sphincter and bladder clot evacuation/fulguration   Patient states that he urinated this morning at approximately and has not been able to pee since.  He states that he has had increasingly intense feeling of urgency.  He states he has had blood clots in his urine today when he did fly.  He has had kidney stones in the past.  He states that he has no flank pain today but endorses achy, severe, midline suprapubic pain.  He denies any nausea, vomiting, flank pain, fevers, chills.  He has not noticed any dysuria.    Past Medical History:  Diagnosis Date  . Anxiety   . 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   . Cancer ([place])    prostate ca 1999/removal/ rad tx  . Carotid artery occlusion   . Depression   . DVT (deep venous thrombosis) ([place])   . Gout   . History of kidney stones   . Hyperlipidemia   . Hypertension   . Incontinence   . Reflux   . Renal disorder   . Status post implantation of artificial urinary sphincter   . Wears dentures   . Wears glasses     Patient Active Problem List   Diagnosis Date Noted  . Syncope 09/07/2018  . Acute lower UTI 09/07/2018  . ARF (acute renal failure) ([place]) 09/07/2018  . Gross hematuria 07/25/2018  . Sepsis due to urinary tract infection ([place]) 07/19/2018  . Paroxysmal  A-fib ([place]) 07/19/2018  . Chronic kidney disease, stage IV (severe) ([place]) 07/19/2018  . Hyperlipidemia 08/05/2014  . Knee effusion, right 04/21/2012  . Essential hypertension 07/25/2011  . Anemia 08/27/2010  . CAROTID ARTERY DISEASE 06/14/2010  . CLOSED FRACTURE OF ACROMIAL END OF CLAVICLE 04/25/2010    Past Surgical History:  Procedure Laterality Date  . AV FISTULA PLACEMENT Left 05/15/2020   Procedure: LEFT FOREARM  ARTERIOVENOUS (AV) GRAFT INSERTION;  Surgeon: Angelia Mould, MD;  Location: [facility];  Service: Vascular;  Laterality: Left;  . CARDIAC CATHETERIZATION     2000-2001  . cataracts    . CORONARY STENT PLACEMENT    . HERNIA REPAIR    . MULTIPLE TOOTH EXTRACTIONS    . NEPHROSTOMY    . radical prostectomy    . URINARY SPHINCTER IMPLANT         Family History  Problem Relation Age of Onset  . Coronary artery disease Other        family history, male < 73  . Arthritis Other        family history  . Cancer Other   . Kidney disease Other     Social History   Tobacco Use  . Smoking status: Former Smoker    Packs/day: 2.00    Types: Cigarettes    Start date: 08/05/1962    Quit date:  12/02/1972    Years since quitting: 47.5  . Smokeless tobacco: Never Used  Vaping Use  . Vaping Use: Never used  Substance Use Topics  . Alcohol use: No  . Drug use: No    Home Medications Prior to Admission medications   Medication Sig Start Date End Date Taking? Authorizing Provider  acetaminophen (TYLENOL) 500 MG tablet Take 1,000 mg by mouth every 8 (eight) hours as needed for mild pain or moderate pain. For headache pain     [provider]  ALFALFA PO Take 250 mg by mouth 2 (two) times daily.     [provider]  allopurinol (ZYLOPRIM) 100 MG tablet Take 100 mg by mouth daily.  10/07/18   [provider]  ALPRAZolam Duanne Moron) 0.5 MG tablet Take 0.5 mg by mouth 2 (two) times daily.  10/08/18   [provider]  amLODipine (NORVASC) 10 MG  tablet Take 10 mg by mouth daily. 03/27/20   [provider]  ARIPiprazole (ABILIFY) 20 MG tablet Take 20 mg by mouth daily. 03/27/20   [provider]  carvedilol (COREG) 3.125 MG tablet Take 6.25 mg by mouth 2 (two) times daily. 04/25/20   [provider]  ceFEPIme 1 g in sodium chloride 0.9 % 100 mL Inject 1 g into the vein daily. 09/10/18   [name], MD  cephALEXin (KEFLEX) 500 MG capsule Take 1 capsule (500 mg total) by mouth 3 (three) times daily for 10 days. 05/28/20 06/07/20  Tedd Sias, PA  epoetin alfa (EPOGEN) 3000 UNIT/ML injection Inject 3,000 Units into the vein every 14 (fourteen) days.    [provider]  fish oil-omega-3 fatty acids 1000 MG capsule Take 1 g by mouth daily.     [provider]  furosemide (LASIX) 40 MG tablet Take 40 mg by mouth daily as needed. 03/27/20   [provider]  gabapentin (NEURONTIN) 100 MG capsule Take 100 mg by mouth at bedtime. 05/02/20   [provider]  HYDROcodone-acetaminophen (NORCO) 5-325 MG tablet Take 1 tablet by mouth every 6 (six) hours as needed for moderate pain. 05/18/20   Dagoberto Ligas, PA-C  lansoprazole (PREVACID) 15 MG capsule  08/24/10   [provider]  meclizine (ANTIVERT) 12.5 MG tablet Take 12.5 mg by mouth daily.    [provider]  Multiple Vitamin (MULTIVITAMIN WITH MINERALS) TABS tablet Take 1 tablet by mouth daily.    [provider]  ofloxacin (FLOXIN) 0.3 % OTIC solution PUT 5 DROPS TWICE DAILY INTO AFFECTED EAR 10/19/18   [provider]  Probiotic Product (RISA-BID PROBIOTIC) TABS Give 1 tablet by mouth twice a day from 09/14/2018-09/24/2018    [provider]  rosuvastatin (CRESTOR) 10 MG tablet Take 10 mg by mouth daily.     [provider]  sertraline (ZOLOFT) 100 MG tablet Take 100 mg by mouth daily.    [provider]  sodium bicarbonate 650 MG tablet TK 1 T PO  TID 10/13/18   [provider]  traMADol (ULTRAM) 50 MG tablet Take 1 tablet (50 mg total) by mouth every 6 (six) hours as needed. 05/15/20   Angelia Mould, MD    Allergies    Ivp dye [iodinated diagnostic agents], Nalbuphine, and Codeine  Review of Systems   Review of Systems  Constitutional: Negative for chills and fever.  HENT: Negative for congestion.   Eyes: Negative for pain.  Respiratory: Negative for cough and shortness of breath.   Cardiovascular:  Negative for chest pain and leg swelling.  Gastrointestinal: Positive for abdominal pain (Lower). Negative for diarrhea, nausea and vomiting.  Genitourinary: Positive for difficulty urinating and hematuria. Negative for dysuria and penile pain.  Musculoskeletal: Negative for myalgias.  Skin: Negative for rash.  Neurological: Negative for dizziness and headaches.    Physical Exam Updated Vital Signs BP (!) 184/74 (BP Location: Right Arm)   Pulse 98   Temp 98.3 F (36.8 C) (Oral)   Resp 19   Ht 5\' 8"  (1.727 m)   Wt 81.6 kg   SpO2 100%   BMI 27.37 kg/m   Physical Exam Vitals and nursing note reviewed.  Constitutional:      General: He is not in acute distress. HENT:     Head: Normocephalic and atraumatic.     Nose: Nose normal.  Eyes:     General: No scleral icterus. Cardiovascular:     Rate and Rhythm: Normal rate and regular rhythm.     Pulses: Normal pulses.     Heart sounds: Normal heart sounds.  Pulmonary:     Effort: Pulmonary effort is normal. No respiratory distress.     Breath sounds: No wheezing.  Abdominal:     Palpations: Abdomen is soft.     Tenderness: There is no abdominal tenderness. There is no guarding or rebound.     Comments: Foley catheter at bedside with 350 mL of grossly hematuric urine at 5 PM  Musculoskeletal:     Cervical back: Normal range of motion.     Right lower leg: No edema.     Left lower leg: No edema.  Skin:    General: Skin is warm and dry.     Capillary Refill: Capillary refill  takes less than 2 seconds.     Comments: AV fistula with recent incision marks with skin glue present.  Good thrill.  Neurological:     Mental Status: He is alert. Mental status is at baseline.  Psychiatric:        Mood and Affect: Mood normal.        Behavior: Behavior normal.     ED Results / Procedures / Treatments   Labs (all labs ordered are listed, but only abnormal results are displayed) Labs Reviewed  URINALYSIS, ROUTINE W REFLEX MICROSCOPIC - Abnormal; Notable for the following components:      Result Value   Color, Urine RED (*)    APPearance HAZY (*)    Hgb urine dipstick LARGE (*)    Protein, ur 100 (*)    RBC / HPF >50 (*)    Bacteria, UA FEW (*)    All other components within normal limits  BASIC METABOLIC PANEL - Abnormal; Notable for the following components:   Sodium 131 (*)    Chloride 96 (*)    CO2 21 (*)    Glucose, Bld 111 (*)    BUN 54 (*)    Creatinine, Ser 3.73 (*)    Calcium 8.5 (*)    GFR calc non Af Amer 14 (*)    GFR calc Af Amer 16 (*)    All other components within normal limits  CBC WITH DIFFERENTIAL/PLATELET - Abnormal; Notable for the following components:   RBC 2.72 (*)    Hemoglobin 8.8 (*)    HCT 26.2 (*)    Platelets 138 (*)    All other components within normal limits  URINE CULTURE    EKG None  Radiology CT Renal Stone Study  Result Date:  05/28/2020 CLINICAL DATA:  Urinary retention. Lower abdominal pain and groin pain EXAM: CT ABDOMEN AND PELVIS WITHOUT CONTRAST TECHNIQUE: Multidetector CT imaging of the abdomen and pelvis was performed following the standard protocol without IV contrast. COMPARISON:  July 19, 2018. FINDINGS: Lower chest: The lung bases are clear. The heart is enlarged. Hepatobiliary: The liver is normal. Normal gallbladder.There is no biliary ductal dilation. Pancreas: Normal contours without ductal dilatation. No peripancreatic fluid collection. Spleen: Unremarkable. Adrenals/Urinary Tract: --Adrenal glands:  Unremarkable. --Right kidney/ureter: There is mild right-sided hydroureteronephrosis to the level of the urinary bladder. There is no evidence for an obstructing stone. --Left kidney/ureter: There is mild left-sided hydroureteronephrosis to the level of the urinary bladder. There is no evidence for an obstructing stone. --Urinary bladder: The urinary bladder is decompressed with a Foley catheter. There is bladder wall thickening. Stomach/Bowel: --Stomach/Duodenum: No hiatal hernia or other gastric abnormality. Normal duodenal course and caliber. --Small bowel: Unremarkable. --Colon: Rectosigmoid diverticulosis without acute inflammation. --Appendix: Normal. Vascular/Lymphatic: Atherosclerotic calcification is present within the non-aneurysmal abdominal aorta, without hemodynamically significant stenosis. --No retroperitoneal lymphadenopathy. --No mesenteric lymphadenopathy. --No pelvic or inguinal lymphadenopathy. Reproductive: The patient is status post prior prostatectomy. Other: No ascites or free air. There is a fat containing left inguinal hernia. Musculoskeletal. There is an age-indeterminate compression fracture of the L3 vertebral body, new since 2019. IMPRESSION: 1. Mild bilateral hydroureteronephrosis to the level of the urinary bladder. There is no evidence for an obstructing stone. 2. Rectosigmoid diverticulosis without acute inflammation. 3. Fat containing left inguinal hernia. Aortic Atherosclerosis (ICD10-I70.0). Electronically Signed   By: Constance Holster M.D.   On: 05/28/2020 18:59    Procedures Procedures (including critical care time)  Medications Ordered in ED Medications  cephALEXin (KEFLEX) capsule 1,000 mg (1,000 mg Oral Given 05/28/20 1802)    ED Course  I have reviewed the triage vital signs and the nursing notes.  Pertinent labs & imaging results that were available during my care of the patient were reviewed by me and considered in my medical decision making (see chart for  details).    MDM Rules/Calculators/A&P                          Patient is an 86 year old male with a history of prostatectomy and history of urinary retention in the past seen by urology however is not followed by 1 currently.  Presented today with urinary retention/lower abdominal pain/hematuria that began today.  Patient had Foley catheter placed before my evaluation had 350 mL of output has obvious hematuria.  As patient did have pain with his hematuria will obtain CT renal to rule out an obstructing stone.  He did have bacteria in his urine we will go ahead and treat empirically and have patient follow-up with urologist to review urine culture.  Exam is unremarkable at this time.  See PE for full details.  BMP with no significant electrolyte abnormalities patient does have mildly low sodium mildly low chloride however patient is having any symptoms of this.  BUN and creatinine are at baseline for patient.  No significant changes.  CBC without leukocytosis patient has baseline anemia.  Has chronic thrombopenia which is present again today.  As discussed above there are a few bacteria and large amount of blood with grossly red urine.  CT renal study shows no stone.  Bilateral hydronephrosis which is likely chronic.  I discussed this case with my attending physician who cosigned this note including patient's presenting  symptoms, physical exam, and planned diagnostics and interventions. Attending physician stated agreement with plan or made changes to plan which were implemented.   Attending physician assessed patient at bedside.  Patient continues to have good urine output at this time.  Has received 1 dose of Keflex and discharged with prescription.  He will follow-up with urology department.  Discharged with Foley in place.  Final Clinical Impression(s) / ED Diagnoses Final diagnoses:  Gross hematuria  Suprapubic abdominal pain  Urinary retention    Rx / DC Orders ED Discharge  Orders         Ordered    cephALEXin (KEFLEX) 500 MG capsule  3 times daily,   Status:  Discontinued     Reprint     05/28/20 1932    cephALEXin (KEFLEX) 500 MG capsule  3 times daily     Discontinue  Reprint     05/28/20 1932           Tedd Sias, PA 05/28/20 1958    Fredia Sorrow, MD 05/31/20 [phone]

## 2020-05-28 NOTE — ED Provider Notes (Signed)
Medical screening examination/treatment/procedure(s) were conducted as a shared visit with non-physician practitioner(s) and myself.  I personally evaluated the patient during the encounter.    Patient seen by me along with physician assistant.  Patient with a history significant for having prostate removed.  And does have an artificial sphincter.  Patient states that he has had blood and blood clots in his urine and some difficulty of voiding today.  Patient states he has had kidney stones in the past patient states that he was last able to void completely around 9 AM this morning.  Foley catheter was placed initially got about 325 cc output so patient's bladder was not over distended.  Definitely hematuria.  CT renal study done which showed some mild hydronephrosis in both ureters.  And his prostate had been removed.  But no other significant findings.  Urinalysis obviously shows that hematuria not classic for urinary tract infection but sent for culture we will go ahead and treat with Keflex.  Will have him follow-up with [facility].  Patient not followed by any urologist at this point in time and has been for a long time.  Patient although not did have bladder overdistended with the hematuria we will go ahead and leave the Foley catheter in place.  Until seen by urology.   Fredia Sorrow, MD 05/28/20 [phone]

## 2020-05-28 NOTE — ED Notes (Signed)
ED Provider at bedside. 

## 2020-05-28 NOTE — Discharge Instructions (Addendum)
Please keep Foley catheter in place at this time.  Your CT scan was negative for any stones.  Your kidney function is relatively unchanged.  Work-up is reassuring today however you will need to follow-up with a urologist.  I have provided you with the closest urologist here in [place] as well as the office in [place].  There was some bacteria in her urine which can cause blood in your urine and can cause obstruction.  Therefore I am treating you with a antibiotic.  You have received your first dose here in the ER today.  Please fill your prescription and take for entire course of antibiotics.

## 2020-05-28 NOTE — ED Triage Notes (Signed)
Pt to er, pt states that he has blood clots in his urine, states that he is unable to void at this time.  Pt states that he needs a catheter, states that he was last able to void around 9 or 10 am

## 2020-05-28 NOTE — ED Notes (Signed)
Pt states he has been passing clots now unable to void since 1000 this morning.  Pt with hx of incont. For years per pt.

## 2020-05-29 ENCOUNTER — Encounter (HOSPITAL_COMMUNITY): Payer: Self-pay | Admitting: *Deleted

## 2020-05-29 ENCOUNTER — Other Ambulatory Visit: Payer: Self-pay

## 2020-05-29 ENCOUNTER — Inpatient Hospital Stay (HOSPITAL_COMMUNITY)
Admission: EM | Admit: 2020-05-29 | Discharge: 2020-06-06 | DRG: 871 | Disposition: A | Discharge status: Home Health | Payer: MEDICARE | Attending: Family Medicine | Admitting: Family Medicine

## 2020-05-29 ENCOUNTER — Emergency Department (HOSPITAL_COMMUNITY): Payer: MEDICARE

## 2020-05-29 ENCOUNTER — Telehealth: Payer: Self-pay

## 2020-05-29 DIAGNOSIS — E785 Hyperlipidemia, unspecified: Secondary | ICD-10-CM

## 2020-05-29 DIAGNOSIS — Z23 Encounter for immunization: Secondary | ICD-10-CM

## 2020-05-29 DIAGNOSIS — N12 Tubulo-interstitial nephritis, not specified as acute or chronic: Secondary | ICD-10-CM

## 2020-05-29 DIAGNOSIS — B965 Pseudomonas (aeruginosa) (mallei) (pseudomallei) as the cause of diseases classified elsewhere: Secondary | ICD-10-CM | POA: Diagnosis present

## 2020-05-29 DIAGNOSIS — Z87442 Personal history of urinary calculi: Secondary | ICD-10-CM

## 2020-05-29 DIAGNOSIS — I480 Paroxysmal atrial fibrillation: Secondary | ICD-10-CM | POA: Diagnosis present

## 2020-05-29 DIAGNOSIS — A4152 Sepsis due to Pseudomonas: Secondary | ICD-10-CM | POA: Diagnosis present

## 2020-05-29 DIAGNOSIS — R5381 Other malaise: Secondary | ICD-10-CM | POA: Diagnosis not present

## 2020-05-29 DIAGNOSIS — I10 Essential (primary) hypertension: Secondary | ICD-10-CM | POA: Diagnosis not present

## 2020-05-29 DIAGNOSIS — Z20822 Contact with and (suspected) exposure to covid-19: Secondary | ICD-10-CM | POA: Diagnosis present

## 2020-05-29 DIAGNOSIS — Z955 Presence of coronary angioplasty implant and graft: Secondary | ICD-10-CM

## 2020-05-29 DIAGNOSIS — Z9079 Acquired absence of other genital organ(s): Secondary | ICD-10-CM

## 2020-05-29 DIAGNOSIS — Z8546 Personal history of malignant neoplasm of prostate: Secondary | ICD-10-CM

## 2020-05-29 DIAGNOSIS — Z923 Personal history of irradiation: Secondary | ICD-10-CM | POA: Diagnosis not present

## 2020-05-29 DIAGNOSIS — R739 Hyperglycemia, unspecified: Secondary | ICD-10-CM | POA: Diagnosis present

## 2020-05-29 DIAGNOSIS — G629 Polyneuropathy, unspecified: Secondary | ICD-10-CM | POA: Diagnosis present

## 2020-05-29 DIAGNOSIS — I2510 Atherosclerotic heart disease of native coronary artery without angina pectoris: Secondary | ICD-10-CM

## 2020-05-29 DIAGNOSIS — G9341 Metabolic encephalopathy: Secondary | ICD-10-CM | POA: Diagnosis present

## 2020-05-29 DIAGNOSIS — A419 Sepsis, unspecified organism: Secondary | ICD-10-CM

## 2020-05-29 DIAGNOSIS — Z66 Do not resuscitate: Secondary | ICD-10-CM | POA: Diagnosis present

## 2020-05-29 DIAGNOSIS — Z79899 Other long term (current) drug therapy: Secondary | ICD-10-CM

## 2020-05-29 DIAGNOSIS — R69 Illness, unspecified: Secondary | ICD-10-CM | POA: Diagnosis not present

## 2020-05-29 DIAGNOSIS — N184 Chronic kidney disease, stage 4 (severe): Secondary | ICD-10-CM

## 2020-05-29 DIAGNOSIS — Z7401 Bed confinement status: Secondary | ICD-10-CM | POA: Diagnosis not present

## 2020-05-29 DIAGNOSIS — Z86718 Personal history of other venous thrombosis and embolism: Secondary | ICD-10-CM | POA: Diagnosis not present

## 2020-05-29 DIAGNOSIS — F418 Other specified anxiety disorders: Secondary | ICD-10-CM | POA: Diagnosis present

## 2020-05-29 DIAGNOSIS — K219 Gastro-esophageal reflux disease without esophagitis: Secondary | ICD-10-CM | POA: Diagnosis present

## 2020-05-29 DIAGNOSIS — D631 Anemia in chronic kidney disease: Secondary | ICD-10-CM | POA: Diagnosis present

## 2020-05-29 DIAGNOSIS — N186 End stage renal disease: Secondary | ICD-10-CM | POA: Diagnosis present

## 2020-05-29 DIAGNOSIS — N1330 Unspecified hydronephrosis: Secondary | ICD-10-CM | POA: Diagnosis not present

## 2020-05-29 DIAGNOSIS — Z87891 Personal history of nicotine dependence: Secondary | ICD-10-CM

## 2020-05-29 DIAGNOSIS — Z596 Low income: Secondary | ICD-10-CM

## 2020-05-29 DIAGNOSIS — N136 Pyonephrosis: Secondary | ICD-10-CM | POA: Diagnosis present

## 2020-05-29 DIAGNOSIS — I120 Hypertensive chronic kidney disease with stage 5 chronic kidney disease or end stage renal disease: Secondary | ICD-10-CM | POA: Diagnosis present

## 2020-05-29 DIAGNOSIS — Z885 Allergy status to narcotic agent status: Secondary | ICD-10-CM

## 2020-05-29 DIAGNOSIS — R310 Gross hematuria: Secondary | ICD-10-CM

## 2020-05-29 DIAGNOSIS — Y842 Radiological procedure and radiotherapy as the cause of abnormal reaction of the patient, or of later complication, without mention of misadventure at the time of the procedure: Secondary | ICD-10-CM | POA: Diagnosis present

## 2020-05-29 LAB — CBC WITH DIFFERENTIAL/PLATELET
Abs Immature Granulocytes: 0.11 10*3/uL — ABNORMAL HIGH (ref 0.00–0.07)
Basophils Absolute: 0 10*3/uL (ref 0.0–0.1)
Basophils Relative: 0 %
Eosinophils Absolute: 0 10*3/uL (ref 0.0–0.5)
Eosinophils Relative: 0 %
HCT: 28.3 % — ABNORMAL LOW (ref 39.0–52.0)
Hemoglobin: 9.4 g/dL — ABNORMAL LOW (ref 13.0–17.0)
Immature Granulocytes: 1 %
Lymphocytes Relative: 5 %
Lymphs Abs: 0.8 10*3/uL (ref 0.7–4.0)
MCH: 31.9 pg (ref 26.0–34.0)
MCHC: 33.2 g/dL (ref 30.0–36.0)
MCV: 95.9 fL (ref 80.0–100.0)
Monocytes Absolute: 0.9 10*3/uL (ref 0.1–1.0)
Monocytes Relative: 5 %
Neutro Abs: 15.2 10*3/uL — ABNORMAL HIGH (ref 1.7–7.7)
Neutrophils Relative %: 89 %
Platelets: 165 10*3/uL (ref 150–400)
RBC: 2.95 MIL/uL — ABNORMAL LOW (ref 4.22–5.81)
RDW: 14.8 % (ref 11.5–15.5)
WBC: 17 10*3/uL — ABNORMAL HIGH (ref 4.0–10.5)
nRBC: 0 % (ref 0.0–0.2)

## 2020-05-29 LAB — URINALYSIS, ROUTINE W REFLEX MICROSCOPIC
Bacteria, UA: NONE SEEN
Bilirubin Urine: NEGATIVE
Glucose, UA: NEGATIVE mg/dL
Ketones, ur: NEGATIVE mg/dL
Nitrite: NEGATIVE
Protein, ur: 100 mg/dL — AB
RBC / HPF: >50 RBC/hpf — ABNORMAL HIGH (ref 0–5)
Specific Gravity, Urine: 1.008 (ref 1.005–1.030)
pH: 6 (ref 5.0–8.0)

## 2020-05-29 LAB — COMPREHENSIVE METABOLIC PANEL
ALT: 14 U/L (ref 0–44)
AST: 32 U/L (ref 15–41)
Albumin: 4.1 g/dL (ref 3.5–5.0)
Alkaline Phosphatase: 73 U/L (ref 38–126)
Anion gap: 12 (ref 5–15)
BUN: 54 mg/dL — ABNORMAL HIGH (ref 8–23)
CO2: 20 mmol/L — ABNORMAL LOW (ref 22–32)
Calcium: 9 mg/dL (ref 8.9–10.3)
Chloride: 100 mmol/L (ref 98–111)
Creatinine, Ser: 3.81 mg/dL — ABNORMAL HIGH (ref 0.61–1.24)
GFR calc Af Amer: 16 mL/min — ABNORMAL LOW (ref >60)
GFR calc non Af Amer: 14 mL/min — ABNORMAL LOW (ref >60)
Glucose, Bld: 126 mg/dL — ABNORMAL HIGH (ref 70–99)
Potassium: 5.1 mmol/L (ref 3.5–5.1)
Sodium: 132 mmol/L — ABNORMAL LOW (ref 135–145)
Total Bilirubin: 1.1 mg/dL (ref 0.3–1.2)
Total Protein: 8.1 g/dL (ref 6.5–8.1)

## 2020-05-29 LAB — LACTIC ACID, PLASMA: Lactic Acid, Venous: 0.9 mmol/L (ref 0.5–1.9)

## 2020-05-29 LAB — SARS CORONAVIRUS 2 BY RT PCR (HOSPITAL ORDER, PERFORMED IN ~~LOC~~ HOSPITAL LAB): SARS Coronavirus 2: NEGATIVE

## 2020-05-29 MED ORDER — AMLODIPINE BESYLATE 5 MG PO TABS
10.0000 mg | ORAL_TABLET | Freq: Every day | ORAL | Status: DC
  Administered 2020-05-30 – 2020-06-06 (×8): 10 mg via ORAL
  Filled 2020-05-29 (×8): qty 2

## 2020-05-29 MED ORDER — ONDANSETRON HCL 4 MG/2ML IJ SOLN: 4.0000 mg | Freq: Four times a day (QID) | INTRAMUSCULAR | Status: DC | PRN

## 2020-05-29 MED ORDER — ALLOPURINOL 100 MG PO TABS: 100.0000 mg | ORAL_TABLET | Freq: Every day | ORAL | Status: DC

## 2020-05-29 MED ORDER — SODIUM CHLORIDE 0.9 % IV SOLN
1000.0000 mL | INTRAVENOUS | Status: DC
  Administered 2020-05-29 – 2020-05-30 (×3): 1000 mL via INTRAVENOUS

## 2020-05-29 MED ORDER — SODIUM CHLORIDE 0.9 % IV SOLN
1.0000 g | INTRAVENOUS | Status: DC
  Administered 2020-05-30: 1 g via INTRAVENOUS
  Filled 2020-05-29: qty 10

## 2020-05-29 MED ORDER — ARIPIPRAZOLE 10 MG PO TABS
20.0000 mg | ORAL_TABLET | Freq: Every day | ORAL | Status: DC
  Administered 2020-05-30 – 2020-06-06 (×8): 20 mg via ORAL
  Filled 2020-05-29 (×8): qty 2

## 2020-05-29 MED ORDER — GABAPENTIN 100 MG PO CAPS
100.0000 mg | ORAL_CAPSULE | Freq: Every day | ORAL | Status: DC
  Administered 2020-05-29 – 2020-06-05 (×8): 100 mg via ORAL
  Filled 2020-05-29 (×7): qty 1

## 2020-05-29 MED ORDER — SODIUM CHLORIDE 0.9 % IV SOLN
2.0000 g | Freq: Once | INTRAVENOUS | Status: AC
  Administered 2020-05-29: 2 g via INTRAVENOUS
  Filled 2020-05-29: qty 20

## 2020-05-29 MED ORDER — ALPRAZOLAM 0.5 MG PO TABS
0.5000 mg | ORAL_TABLET | Freq: Two times a day (BID) | ORAL | Status: DC
  Administered 2020-05-29 – 2020-05-31 (×4): 0.5 mg via ORAL
  Filled 2020-05-29 (×4): qty 1

## 2020-05-29 MED ORDER — PNEUMOCOCCAL VAC POLYVALENT 25 MCG/0.5ML IJ INJ
0.5000 mL | INJECTION | INTRAMUSCULAR | Status: AC
  Administered 2020-06-03: 0.5 mL via INTRAMUSCULAR
  Filled 2020-05-29: qty 0.5

## 2020-05-29 MED ORDER — ROSUVASTATIN CALCIUM 10 MG PO TABS
10.0000 mg | ORAL_TABLET | Freq: Every day | ORAL | Status: DC
  Administered 2020-05-30 – 2020-06-06 (×8): 10 mg via ORAL
  Filled 2020-05-29 (×8): qty 1

## 2020-05-29 MED ORDER — ONDANSETRON HCL 4 MG PO TABS: 4.0000 mg | ORAL_TABLET | Freq: Four times a day (QID) | ORAL | Status: DC | PRN

## 2020-05-29 MED ORDER — TRAMADOL HCL 50 MG PO TABS
50.0000 mg | ORAL_TABLET | Freq: Four times a day (QID) | ORAL | Status: DC | PRN
  Filled 2020-05-29: qty 1

## 2020-05-29 MED ORDER — CARVEDILOL 3.125 MG PO TABS
6.2500 mg | ORAL_TABLET | Freq: Two times a day (BID) | ORAL | Status: DC
  Administered 2020-05-29 – 2020-06-06 (×16): 6.25 mg via ORAL
  Filled 2020-05-29 (×16): qty 2

## 2020-05-29 MED ORDER — SERTRALINE HCL 50 MG PO TABS
100.0000 mg | ORAL_TABLET | Freq: Every day | ORAL | Status: DC
  Administered 2020-05-30 – 2020-06-06 (×8): 100 mg via ORAL
  Filled 2020-05-29 (×8): qty 2

## 2020-05-29 MED ORDER — PANTOPRAZOLE SODIUM 40 MG PO TBEC
40.0000 mg | DELAYED_RELEASE_TABLET | Freq: Every day | ORAL | Status: DC
  Administered 2020-05-30 – 2020-06-06 (×8): 40 mg via ORAL
  Filled 2020-05-29 (×8): qty 1

## 2020-05-29 MED ORDER — SODIUM BICARBONATE 650 MG PO TABS
650.0000 mg | ORAL_TABLET | Freq: Three times a day (TID) | ORAL | Status: DC
  Administered 2020-05-30 – 2020-06-05 (×21): 650 mg via ORAL
  Filled 2020-05-29 (×18): qty 1

## 2020-05-29 MED ORDER — FUROSEMIDE 40 MG PO TABS: 40.0000 mg | ORAL_TABLET | Freq: Every day | ORAL | Status: DC | PRN

## 2020-05-29 MED ORDER — HYDROCODONE-ACETAMINOPHEN 5-325 MG PO TABS
1.0000 | ORAL_TABLET | Freq: Four times a day (QID) | ORAL | Status: DC | PRN
  Administered 2020-05-29: 1 via ORAL
  Filled 2020-05-29: qty 1

## 2020-05-29 MED ORDER — MECLIZINE HCL 12.5 MG PO TABS: 12.5000 mg | ORAL_TABLET | Freq: Two times a day (BID) | ORAL | Status: DC | PRN

## 2020-05-29 MED ORDER — ALLOPURINOL 100 MG PO TABS
50.0000 mg | ORAL_TABLET | Freq: Every day | ORAL | Status: DC
  Administered 2020-05-30 – 2020-06-06 (×8): 50 mg via ORAL
  Filled 2020-05-29 (×8): qty 1

## 2020-05-29 NOTE — ED Notes (Signed)
Pt in bed drinking oral contrast, pt reports that he is still having penis pain, denies feelings like his bladder is filling, pt has urine in foley bag.

## 2020-05-29 NOTE — ED Triage Notes (Signed)
Pt states he was seen here yesterday and had a catheter placed and today he is having perineal pain and unable to urinate; pt has noticed some blood from the catheter

## 2020-05-29 NOTE — ED Notes (Signed)
aprox 80 ml of bloody urine drained from foley bag

## 2020-05-29 NOTE — ED Notes (Signed)
Pt in bed, pt denies pain at this time, helped pt with phone to call son and explain plan of care, drained 340 ml from foley leg bag.  Draining bloody urine

## 2020-05-29 NOTE — Telephone Encounter (Signed)
Pt care taker called this am with concerning of grossly blood drainage into catheter bag with no urine output. I spoke with Dr. Alyson Ingles who recommends pt to go to nearest ER for evaluation of grossly hematuria as he may need acute intervention

## 2020-05-29 NOTE — H&P (Addendum)
History and Physical    [name] [name] VOZ:[id number] DOB: 12/26/1933 DOA: 05/29/2020  PCP: [name], MD Patient coming from:home  Chief Complaint: urinary pain, fluid retention and bloody urine  HPI: [name] [name] is a 86 y.o. male with medical history significant of DVT, prostate cancer, CAD, Nephrolithiasis, CKD/ESRD, HTN, HLD. Pt presenting w/ 2 day h/o hematuria. Passing red urine and clots. Presented ot [facility] ED 1 day ago and was dx w/ hydronephrosis and hematuria. Foley catheter was placed and pt placed on Keflex though definitive UTI was not appreciated. Pt states that since that time he has had increasing penile and groin pain. Pt was evaluated by home aid who told him he was gaining water weight and should get checked out. Pt then came to the Ed. Pt endorses sx as above and fatigue but denies LE swelling, SOB, DOE, CP, palpitations, fevers, myalgias.   ED Course: Objective findings as below.   Review of Systems: As per HPI otherwise all other systems reviewed and are negative  Ambulatory Status:limited due to progressive weakness   Past Medical History:  Diagnosis Date  . Anxiety   . 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   . Cancer ([place])    prostate ca 1999/removal/ rad tx  . Carotid artery occlusion   . Depression   . DVT (deep venous thrombosis) ([place])   . Gout   . History of kidney stones   . Hyperlipidemia   . Hypertension   . Incontinence   . Reflux   . Renal disorder   . Status post implantation of artificial urinary sphincter   . Wears dentures   . Wears glasses     Past Surgical History:  Procedure Laterality Date  . AV FISTULA PLACEMENT Left 05/15/2020   Procedure: LEFT FOREARM  ARTERIOVENOUS (AV) GRAFT INSERTION;  Surgeon: Angelia Mould, MD;  Location: [facility];  Service: Vascular;  Laterality: Left;  . CARDIAC CATHETERIZATION     2000-2001  . cataracts    . CORONARY  STENT PLACEMENT    . HERNIA REPAIR    . MULTIPLE TOOTH EXTRACTIONS    . NEPHROSTOMY    . radical prostectomy    . URINARY SPHINCTER IMPLANT      Social History   Socioeconomic History  . Marital status: Widowed    Spouse name: Not on file  . Number of children: Not on file  . Years of education: Not on file  . Highest education level: Not on file  Occupational History  . Occupation: retired  Tobacco Use  . Smoking status: Former Smoker    Packs/day: 2.00    Types: Cigarettes    Start date: 08/05/1962    Quit date: 12/02/1972    Years since quitting: 47.5  . Smokeless tobacco: Never Used  Vaping Use  . Vaping Use: Never used  Substance and Sexual Activity  . Alcohol use: No  . Drug use: No  . Sexual activity: Not on file  Other Topics Concern  . Not on file  Social History Narrative  . Not on file   Social Determinants of Health   Financial Resource Strain:   . Difficulty of Paying Living Expenses:   Food Insecurity:   . Worried About Charity fundraiser in the Last Year:   . Arboriculturist in the Last Year:   Transportation Needs:   . Lack of Transportation (  Medical):   Marland Kitchen Lack of Transportation (Non-Medical):   Physical Activity:   . Days of Exercise per Week:   . Minutes of Exercise per Session:   Stress:   . Feeling of Stress :   Social Connections:   . Frequency of Communication with Friends and Family:   . Frequency of Social Gatherings with Friends and Family:   . Attends Religious Services:   . Active Member of Clubs or Organizations:   . Attends Archivist Meetings:   Marland Kitchen Marital Status:   Intimate Partner Violence:   . Fear of Current or Ex-Partner:   . Emotionally Abused:   Marland Kitchen Physically Abused:   . Sexually Abused:     Allergies  Allergen Reactions  . Ivp Dye [Iodinated Diagnostic Agents]     Due to partial renal failure  . Nalbuphine Other (See Comments)    PATIENT STATES IT MADE HIM FEEL LIKE HE WAS GOING TO DIE. NO SPECIFICS  .  Codeine Anxiety    Family History  Problem Relation Age of Onset  . Coronary artery disease Other        family history, male < 50  . Arthritis Other        family history  . Cancer Other   . Kidney disease Other       Prior to Admission medications   Medication Sig Start Date End Date Taking? Authorizing Provider  acetaminophen (TYLENOL) 500 MG tablet Take 1,000 mg by mouth every 8 (eight) hours as needed for mild pain or moderate pain. For headache pain    Yes [provider]  ALFALFA PO Take 250 mg by mouth 2 (two) times daily.    Yes [provider]  allopurinol (ZYLOPRIM) 100 MG tablet Take 100 mg by mouth daily.  10/07/18  Yes [provider]  ALPRAZolam Duanne Moron) 0.5 MG tablet Take 0.5 mg by mouth 2 (two) times daily.  10/08/18  Yes [provider]  amLODipine (NORVASC) 10 MG tablet Take 10 mg by mouth daily. 03/27/20  Yes [provider]  ARIPiprazole (ABILIFY) 20 MG tablet Take 20 mg by mouth daily. 03/27/20  Yes [provider]  carvedilol (COREG) 3.125 MG tablet Take 6.25 mg by mouth 2 (two) times daily. 04/25/20  Yes [provider]  cephALEXin (KEFLEX) 500 MG capsule Take 1 capsule (500 mg total) by mouth 3 (three) times daily for 10 days. 05/28/20 06/07/20 Yes Fondaw, Wylder S, PA  fish oil-omega-3 fatty acids 1000 MG capsule Take 1 g by mouth daily.    Yes [provider]  furosemide (LASIX) 40 MG tablet Take 40 mg by mouth daily as needed. 03/27/20  Yes [provider]  gabapentin (NEURONTIN) 100 MG capsule Take 100 mg by mouth at bedtime. 05/02/20  Yes [provider]  HYDROcodone-acetaminophen (NORCO) 5-325 MG tablet Take 1 tablet by mouth every 6 (six) hours as needed for moderate pain. 05/18/20  Yes Dagoberto Ligas, PA-C  lansoprazole (PREVACID) 15 MG capsule Take 15 mg by mouth 2 (two) times daily before a meal.  08/24/10  Yes [provider]  meclizine (ANTIVERT) 12.5 MG tablet Take  12.5 mg by mouth daily.   Yes [provider]  Multiple Vitamin (MULTIVITAMIN WITH MINERALS) TABS tablet Take 1 tablet by mouth daily.   Yes [provider]  ofloxacin (FLOXIN) 0.3 % OTIC solution Place 5 drops into both ears daily.  10/19/18  Yes [provider]  Probiotic Product (RISA-BID PROBIOTIC) TABS Give  1 tablet by mouth twice a day from 09/14/2018-09/24/2018   Yes [provider]  rosuvastatin (CRESTOR) 10 MG tablet Take 10 mg by mouth daily.    Yes [provider]  sertraline (ZOLOFT) 100 MG tablet Take 100 mg by mouth daily.   Yes [provider]  sodium bicarbonate 650 MG tablet Take 650 mg by mouth 3 (three) times daily.  10/13/18  Yes [provider]  traMADol (ULTRAM) 50 MG tablet Take 1 tablet (50 mg total) by mouth every 6 (six) hours as needed. 05/15/20  Yes Angelia Mould, MD  ceFEPIme 1 g in sodium chloride 0.9 % 100 mL Inject 1 g into the vein daily. 09/10/18   [name], MD  epoetin alfa (EPOGEN) 3000 UNIT/ML injection Inject 3,000 Units into the vein every 14 (fourteen) days.    [provider]    Physical Exam: Vitals:   05/29/20 1403 05/29/20 1610 05/29/20 1802 05/29/20 2122  BP: (!) 191/83 (!) 172/64 (!) 159/60 133/64  Pulse: (!) 101 (!) 110 (!) 109 95  Resp: 18 (!) 22 (!) 21 18  Temp: 99.3 F (37.4 C)   100.2 F (37.9 C)  TempSrc: Oral   Oral  SpO2: 100% 100% 99% 98%  Weight:      Height:         General: Appears elderly and sick, NAD Eyes:  PERRL, EOMI, normal lids, iris ENT:  grossly normal hearing, lips & tongue, mmm Neck:  no LAD, masses or thyromegaly Cardiovascular: Holosystolic and diastolic murmur heard throughout - transmitted from LUE fistula, Fistula patent. Trace to 1+ LE edema.  Respiratory:  CTA bilaterally, no w/r/r. Normal respiratory effort. Abdomen:  Suprapubic discomfort on palpation, soft, non-distended. NABS Skin:  no rash or induration seen on  limited exam Musculoskeletal:  grossly normal tone BUE/BLE, good ROM, no bony abnormality Psychiatric:  grossly normal mood and affect, speech fluent and appropriate, AOx3 Neurologic:  CN 2-12 grossly intact, moves all extremities in coordinated fashion, sensation intact  Labs on Admission: I have personally reviewed following labs and imaging studies  CBC: Recent Labs  Lab 05/28/20 1709 05/29/20 1524  WBC 6.0 17.0*  NEUTROABS 4.4 15.2*  HGB 8.8* 9.4*  HCT 26.2* 28.3*  MCV 96.3 95.9  PLT 138* 637   Basic Metabolic Panel: Recent Labs  Lab 05/28/20 1709 05/29/20 1524  NA 131* 132*  K 4.5 5.1  CL 96* 100  CO2 21* 20*  GLUCOSE 111* 126*  BUN 54* 54*  CREATININE 3.73* 3.81*  CALCIUM 8.5* 9.0   GFR: Estimated Creatinine Clearance: 14.2 mL/min (A) (by C-G formula based on SCr of 3.81 mg/dL (H)). Liver Function Tests: Recent Labs  Lab 05/29/20 1524  AST 32  ALT 14  ALKPHOS 73  BILITOT 1.1  PROT 8.1  ALBUMIN 4.1   No results for input(s): LIPASE, AMYLASE in the last 168 hours. No results for input(s): AMMONIA in the last 168 hours. Coagulation Profile: No results for input(s): INR, PROTIME in the last 168 hours. Cardiac Enzymes: No results for input(s): CKTOTAL, CKMB, CKMBINDEX, TROPONINI in the last 168 hours. BNP (last 3 results) No results for input(s): PROBNP in the last 8760 hours. HbA1C: No results for input(s): HGBA1C in the last 72 hours. CBG: No results for input(s): GLUCAP in the last 168 hours. Lipid Profile: No results for input(s): CHOL, HDL, LDLCALC, TRIG, CHOLHDL, LDLDIRECT in the last 72 hours. Thyroid Function Tests: No results for input(s): TSH, T4TOTAL, FREET4, T3FREE, THYROIDAB in  the last 72 hours. Anemia Panel: No results for input(s): VITAMINB12, FOLATE, FERRITIN, TIBC, IRON, RETICCTPCT in the last 72 hours. Urine analysis:    Component Value Date/Time   COLORURINE AMBER (A) 05/29/2020 1524   APPEARANCEUR HAZY (A) 05/29/2020 1524    LABSPEC 1.008 05/29/2020 1524   PHURINE 6.0 05/29/2020 1524   GLUCOSEU NEGATIVE 05/29/2020 1524   HGBUR LARGE (A) 05/29/2020 1524   BILIRUBINUR NEGATIVE 05/29/2020 1524   KETONESUR NEGATIVE 05/29/2020 1524   PROTEINUR 100 (A) 05/29/2020 1524   UROBILINOGEN 0.2 03/14/2010 0809   NITRITE NEGATIVE 05/29/2020 1524   LEUKOCYTESUR MODERATE (A) 05/29/2020 1524    Creatinine Clearance: Estimated Creatinine Clearance: 14.2 mL/min (A) (by C-G formula based on SCr of 3.81 mg/dL (H)).  Sepsis Labs: @LABRCNTIP (procalcitonin:4,lacticidven:4) )No results found for this or any previous visit (from the past 240 hour(s)).   Radiological Exams on Admission: CT ABDOMEN PELVIS WO CONTRAST  Result Date: 05/29/2020 CLINICAL DATA:  Bloody drainage into catheter bag no urine output EXAM: CT ABDOMEN AND PELVIS WITHOUT CONTRAST TECHNIQUE: Multidetector CT imaging of the abdomen and pelvis was performed following the standard protocol without IV contrast. COMPARISON:  CT 05/28/2020, 07/19/2018, lumbar radiograph 04/13/2020 FINDINGS: Lower chest: Lung bases demonstrate no acute consolidation or effusion. Coronary vascular calcification. Hepatobiliary: No focal liver abnormality is seen. No gallstones, gallbladder wall thickening, or biliary dilatation. Pancreas: Unremarkable. No pancreatic ductal dilatation or surrounding inflammatory changes. Spleen: Normal in size without focal abnormality. Adrenals/Urinary Tract: Adrenal glands are within normal limits. Similar mild bilateral hydronephrosis and hydroureter down to the level of the bladder without obstructing stone. Bladder nearly empty. Foley catheter in the urinary bladder. Stomach/Bowel: The stomach is nonenlarged. No dilated small bowel. No bowel wall thickening. Negative appendix. Descending and sigmoid colon diverticula without acute inflammatory change. Vascular/Lymphatic: Advanced aortic atherosclerosis without aneurysm. No suspicious nodes. Reproductive:  Prostatectomy. Other: No free air or free fluid. Fat containing left inguinal hernia. Musculoskeletal: Moderate superior endplate compression deformity at L3 IMPRESSION: 1. Similar mild bilateral hydronephrosis and hydroureter down to the level of the bladder without obstructing stone identified. Bladder is nearly empty with Foley catheter in place. 2. Descending and sigmoid colon diverticula without acute inflammatory change. 3. Moderate superior endplate compression deformity at L3, age indeterminate. Aortic Atherosclerosis (ICD10-I70.0). Electronically Signed   By: Donavan Foil M.D.   On: 05/29/2020 19:10   DG Chest Port 1 View  Result Date: 05/29/2020 CLINICAL DATA:  Shortness of breath.  Fluid retention. EXAM: PORTABLE CHEST 1 VIEW COMPARISON:  09/07/2018 FINDINGS: Chronic interstitial lung markings are noted bilaterally. There is no focal infiltrate. The heart size is normal. There is no pneumothorax. No large pleural effusion. There is an old left clavicle fracture. Atherosclerotic changes are noted of the thoracic aorta. IMPRESSION: No active disease. Electronically Signed   By: Constance Holster M.D.   On: 05/29/2020 15:43   CT Renal Stone Study  Result Date: 05/28/2020 CLINICAL DATA:  Urinary retention. Lower abdominal pain and groin pain EXAM: CT ABDOMEN AND PELVIS WITHOUT CONTRAST TECHNIQUE: Multidetector CT imaging of the abdomen and pelvis was performed following the standard protocol without IV contrast. COMPARISON:  July 19, 2018. FINDINGS: Lower chest: The lung bases are clear. The heart is enlarged. Hepatobiliary: The liver is normal. Normal gallbladder.There is no biliary ductal dilation. Pancreas: Normal contours without ductal dilatation. No peripancreatic fluid collection. Spleen: Unremarkable. Adrenals/Urinary Tract: --Adrenal glands: Unremarkable. --Right kidney/ureter: There is mild right-sided hydroureteronephrosis to the level of the urinary bladder. There is  no evidence for an  obstructing stone. --Left kidney/ureter: There is mild left-sided hydroureteronephrosis to the level of the urinary bladder. There is no evidence for an obstructing stone. --Urinary bladder: The urinary bladder is decompressed with a Foley catheter. There is bladder wall thickening. Stomach/Bowel: --Stomach/Duodenum: No hiatal hernia or other gastric abnormality. Normal duodenal course and caliber. --Small bowel: Unremarkable. --Colon: Rectosigmoid diverticulosis without acute inflammation. --Appendix: Normal. Vascular/Lymphatic: Atherosclerotic calcification is present within the non-aneurysmal abdominal aorta, without hemodynamically significant stenosis. --No retroperitoneal lymphadenopathy. --No mesenteric lymphadenopathy. --No pelvic or inguinal lymphadenopathy. Reproductive: The patient is status post prior prostatectomy. Other: No ascites or free air. There is a fat containing left inguinal hernia. Musculoskeletal. There is an age-indeterminate compression fracture of the L3 vertebral body, new since 2019. IMPRESSION: 1. Mild bilateral hydroureteronephrosis to the level of the urinary bladder. There is no evidence for an obstructing stone. 2. Rectosigmoid diverticulosis without acute inflammation. 3. Fat containing left inguinal hernia. Aortic Atherosclerosis (ICD10-I70.0). Electronically Signed   By: Constance Holster M.D.   On: 05/28/2020 18:59    EKG: Independently reviewed. Tachy, Afib.   Assessment/Plan Active Problems:   CAD (coronary artery disease)   Essential hypertension   Hyperlipidemia   Paroxysmal A-fib ([facility]CC)   Chronic kidney disease, stage IV (severe) ([facility])   Gross hematuria   Hydronephrosis with infection   Hyperglycemia     Hydronephrosis/Hematuria/CKD/UTI: Pt approaching ESRD. Cr 3.8 - at baseline. Fistula maturing in LUE. Hematuria for 2 days which has worsened after Foley placed for tx of Hydronephrosis 1 day ago at [facility] ED. Increasing pelvic and penile discomfort w/  climbing WBC, Tachycardia, fever, concerning for urogenic source of infection. Urine appears sterile but pt on Keflex for the past 24 hrs. CT scan on my read w/ some perinephric and bladder stranding. Pt w/ h/o Urosepsis.  - Continue w/ Foley Catheter - Urology consult in am - Follow up on [facility] from 05/28/20 ED evaluation. No current UCX given the fact that pt on ABX - change to Ceftriaxone - Continue PRN Lasix for edema - Hold home Epo unless admission becomes prolonged - Mag/phos in am  Paroxysmal Afib: currently in Afib. Rate controlled - continue Coreg  HTN: - continue Norvasc, Coreg  HLD:  -continue Crestor  Hyperglycemia: - A1c  Depressoin/Anxiety:  - conitnue xanax, Abilify, Zoloft  Gout: no evidence of flare - Reduce  Allopurinol to 50mg   Neuropathy: - continue Neurontin, Norco  DVT prophylaxis: SCD  Code Status: full  Family Communication: none  Disposition Plan: pending improvement in urinary sx and identification of possible infectious source.   Consults called: Urology to be consulted in am  Admission status: inpt    Waldemar Dickens MD [facility]  If 7PM-7AM, please contact night-coverage [email or web address] Password [facility]  05/29/2020, 10:02 PM

## 2020-05-29 NOTE — ED Notes (Addendum)
Pt in bed, pt c/o pain at his penis, pt has foley cath in place, draining bloody/ clots into leg bag, appears to be draining appropriately, bladder scan 18.  Foley appears to be pulling, repositioned and resecured leg anchor for pt's comfort.   aprox 200 ml of bloody urine drained from foley leg bag

## 2020-05-29 NOTE — ED Notes (Signed)
Grossly red urine noted in leg bag.

## 2020-05-29 NOTE — ED Provider Notes (Signed)
[facility] EMERGENCY DEPARTMENT Provider Note   CSN: [id number] Arrival date & time: 05/29/20  1153     History Chief Complaint  Patient presents with   Urinary Retention    [name] [name] is a 86 y.o. male.  HPI   This patient is an 86 year old male, he has a known history of prostate cancer status post removal with an artificial sphincter, history of DVT, hyperlipidemia, hypertension and paroxysmal atrial fibrillation.  He is not on any anticoagulants.  He was seen yesterday in the emergency department because of hematuria, had a Foley catheter placed due to hydronephrosis which was seen on CT scan and some urinary retention.  The patient has not tolerated the catheter stating that it hurts his penis and has a little bit of drainage around it, has had some bloody urine in the catheter bag but was emptied at 10:00 this morning and has filled somewhat since that time.  He denies significant abdominal pain, he does feel like he has gaining a little bit of weight and his nursing assistant that stays with him in the mornings measured him and noticed a 3 pound weight gain.  She called the family doctor and the urologist and was advised to bring him to the emergency department to be evaluated for "fluid on his heart secondary to the weight gain".  The nursing assistant reports a history of possible congestive heart failure.  I reviewed the medical record showing that his last echocardiogram was about October 2019 showing an ejection fraction of 55 to 60%, no wall abnormalities and normal left ventricular diastolic function.  He has had a vascular access fistula placed in his left upper extremity which is waiting to be matured secondary to renal failure, and of note his laboratory work-up yesterday showed a creatinine of 3.7 which was better compared with 2 weeks prior when it was 4.7.  His BUN was 54 and his electrolytes were unremarkable.  A CBC yesterday showed a white blood cell count of 6 and a  hemoglobin of 8.8, both of these seem to be chronic.  He denies fevers or chills, denies shortness of breath or chest pain, denies back pain, he does report some generalized weakness.  He has had some diarrhea this morning.  Past Medical History:  Diagnosis Date   Anxiety    Artificial opening care, other    pt states that he has artifical sphincter, unable to have foley cath placed.    Cancer [facility])    prostate ca 1999/removal/ rad tx   Carotid artery occlusion    Depression    DVT (deep venous thrombosis) ([place])    Gout    History of kidney stones    Hyperlipidemia    Hypertension    Incontinence    Reflux    Renal disorder    Status post implantation of artificial urinary sphincter    Wears dentures    Wears glasses     Patient Active Problem List   Diagnosis Date Noted   Syncope 09/07/2018   Acute lower UTI 09/07/2018   ARF (acute renal failure) ([place]) 09/07/2018   Gross hematuria 07/25/2018   Sepsis due to urinary tract infection ([place]) 07/19/2018   Paroxysmal A-fib ([place]) 07/19/2018   Chronic kidney disease, stage IV (severe) ([place]) 07/19/2018   Hyperlipidemia 08/05/2014   Knee effusion, right 04/21/2012   Essential hypertension 07/25/2011   Anemia 08/27/2010   CAROTID ARTERY DISEASE 06/14/2010   CLOSED FRACTURE OF ACROMIAL END OF CLAVICLE 04/25/2010  Past Surgical History:  Procedure Laterality Date   AV FISTULA PLACEMENT Left 05/15/2020   Procedure: LEFT FOREARM  ARTERIOVENOUS (AV) GRAFT INSERTION;  Surgeon: Angelia Mould, MD;  Location: [facility] OR;  Service: Vascular;  Laterality: Left;   CARDIAC CATHETERIZATION     2000-2001   cataracts     CORONARY STENT PLACEMENT     HERNIA REPAIR     MULTIPLE TOOTH EXTRACTIONS     NEPHROSTOMY     radical prostectomy     URINARY SPHINCTER IMPLANT         Family History  Problem Relation Age of Onset   Coronary artery disease Other        family history, male < 1    Arthritis Other        family history   Cancer Other    Kidney disease Other     Social History   Tobacco Use   Smoking status: Former Smoker    Packs/day: 2.00    Types: Cigarettes    Start date: 08/05/1962    Quit date: 12/02/1972    Years since quitting: 47.5   Smokeless tobacco: Never Used  Vaping Use   Vaping Use: Never used  Substance Use Topics   Alcohol use: No   Drug use: No    Home Medications Prior to Admission medications   Medication Sig Start Date End Date Taking? Authorizing Provider  acetaminophen (TYLENOL) 500 MG tablet Take 1,000 mg by mouth every 8 (eight) hours as needed for mild pain or moderate pain. For headache pain    Yes [provider]  ALFALFA PO Take 250 mg by mouth 2 (two) times daily.    Yes [provider]  allopurinol (ZYLOPRIM) 100 MG tablet Take 100 mg by mouth daily.  10/07/18  Yes [provider]  ALPRAZolam Duanne Moron) 0.5 MG tablet Take 0.5 mg by mouth 2 (two) times daily.  10/08/18  Yes [provider]  amLODipine (NORVASC) 10 MG tablet Take 10 mg by mouth daily. 03/27/20  Yes [provider]  ARIPiprazole (ABILIFY) 20 MG tablet Take 20 mg by mouth daily. 03/27/20  Yes [provider]  carvedilol (COREG) 3.125 MG tablet Take 6.25 mg by mouth 2 (two) times daily. 04/25/20  Yes [provider]  cephALEXin (KEFLEX) 500 MG capsule Take 1 capsule (500 mg total) by mouth 3 (three) times daily for 10 days. 05/28/20 06/07/20 Yes Fondaw, Wylder S, PA  fish oil-omega-3 fatty acids 1000 MG capsule Take 1 g by mouth daily.    Yes [provider]  furosemide (LASIX) 40 MG tablet Take 40 mg by mouth daily as needed. 03/27/20  Yes [provider]  gabapentin (NEURONTIN) 100 MG capsule Take 100 mg by mouth at bedtime. 05/02/20  Yes [provider]  HYDROcodone-acetaminophen (NORCO) 5-325 MG tablet Take 1 tablet by mouth every 6 (six) hours as needed for moderate pain. 05/18/20   Yes Dagoberto Ligas, PA-C  lansoprazole (PREVACID) 15 MG capsule Take 15 mg by mouth 2 (two) times daily before a meal.  08/24/10  Yes [provider]  meclizine (ANTIVERT) 12.5 MG tablet Take 12.5 mg by mouth daily.   Yes [provider]  Multiple Vitamin (MULTIVITAMIN WITH MINERALS) TABS tablet Take 1 tablet by mouth daily.   Yes [provider]  ofloxacin (FLOXIN) 0.3 % OTIC solution Place 5 drops into both ears daily.  10/19/18  Yes [provider]  Probiotic Product (RISA-BID PROBIOTIC) TABS Give 1 tablet  by mouth twice a day from 09/14/2018-09/24/2018   Yes [provider]  rosuvastatin (CRESTOR) 10 MG tablet Take 10 mg by mouth daily.    Yes [provider]  sertraline (ZOLOFT) 100 MG tablet Take 100 mg by mouth daily.   Yes [provider]  sodium bicarbonate 650 MG tablet Take 650 mg by mouth 3 (three) times daily.  10/13/18  Yes [provider]  traMADol (ULTRAM) 50 MG tablet Take 1 tablet (50 mg total) by mouth every 6 (six) hours as needed. 05/15/20  Yes Angelia Mould, MD  ceFEPIme 1 g in sodium chloride 0.9 % 100 mL Inject 1 g into the vein daily. 09/10/18   [name], MD  epoetin alfa (EPOGEN) 3000 UNIT/ML injection Inject 3,000 Units into the vein every 14 (fourteen) days.    [provider]    Allergies    Ivp dye [iodinated diagnostic agents], Nalbuphine, and Codeine  Review of Systems   Review of Systems  All other systems reviewed and are negative.   Physical Exam Updated Vital Signs BP (!) 191/83 (BP Location: Left Arm)    Pulse (!) 101    Temp 99.3 F (37.4 C) (Oral)    Resp 18    Ht 1.727 m (5\' 8" )    Wt 81.6 kg    SpO2 100%    BMI 27.37 kg/m   Physical Exam Vitals and nursing note reviewed.  Constitutional:      General: He is not in acute distress.    Appearance: He is well-developed.  HENT:     Head: Normocephalic and atraumatic.     Mouth/Throat:     Pharynx: No  oropharyngeal exudate.  Eyes:     General: No scleral icterus.       Right eye: No discharge.        Left eye: No discharge.     Conjunctiva/sclera: Conjunctivae normal.     Pupils: Pupils are equal, round, and reactive to light.  Neck:     Thyroid: No thyromegaly.     Vascular: No JVD.  Cardiovascular:     Rate and Rhythm: Regular rhythm. Tachycardia present.     Heart sounds: Normal heart sounds. No murmur heard.  No friction rub. No gallop.      Comments: Pulse of 101, strong radial artery pulses, no peripheral edema or JVD.  His left upper extremity at the antecubital fossa has no redness tenderness or induration, there is a good thrill in the fistula that has been placed, and has not matured yet Pulmonary:     Effort: Pulmonary effort is normal. No respiratory distress.     Breath sounds: Normal breath sounds. No wheezing or rales.     Comments: Lungs are totally clear, speaks without any distress Abdominal:     General: Bowel sounds are normal. There is no distension.     Palpations: Abdomen is soft. There is no mass.     Tenderness: There is no abdominal tenderness.     Comments: There is no abdominal tenderness to palpation, fullness in the lower abdomen  Genitourinary:    Comments: Foley catheter is seated in the urethral meatus appropriately, there is no significant drainage around the catheter, there is some bloody appearing urine in the catheter bag without clots Musculoskeletal:        General: No tenderness. Normal range of motion.     Cervical back: Normal range of motion and neck supple.  Lymphadenopathy:  Cervical: No cervical adenopathy.  Skin:    General: Skin is warm and dry.     Findings: No erythema or rash.  Neurological:     Mental Status: He is alert.     Coordination: Coordination normal.  Psychiatric:        Behavior: Behavior normal.     ED Results / Procedures / Treatments   Labs (all labs ordered are listed, but only abnormal results are  displayed) Labs Reviewed - No data to display  EKG None  Radiology CT Renal Stone Study  Result Date: 05/28/2020 CLINICAL DATA:  Urinary retention. Lower abdominal pain and groin pain EXAM: CT ABDOMEN AND PELVIS WITHOUT CONTRAST TECHNIQUE: Multidetector CT imaging of the abdomen and pelvis was performed following the standard protocol without IV contrast. COMPARISON:  July 19, 2018. FINDINGS: Lower chest: The lung bases are clear. The heart is enlarged. Hepatobiliary: The liver is normal. Normal gallbladder.There is no biliary ductal dilation. Pancreas: Normal contours without ductal dilatation. No peripancreatic fluid collection. Spleen: Unremarkable. Adrenals/Urinary Tract: --Adrenal glands: Unremarkable. --Right kidney/ureter: There is mild right-sided hydroureteronephrosis to the level of the urinary bladder. There is no evidence for an obstructing stone. --Left kidney/ureter: There is mild left-sided hydroureteronephrosis to the level of the urinary bladder. There is no evidence for an obstructing stone. --Urinary bladder: The urinary bladder is decompressed with a Foley catheter. There is bladder wall thickening. Stomach/Bowel: --Stomach/Duodenum: No hiatal hernia or other gastric abnormality. Normal duodenal course and caliber. --Small bowel: Unremarkable. --Colon: Rectosigmoid diverticulosis without acute inflammation. --Appendix: Normal. Vascular/Lymphatic: Atherosclerotic calcification is present within the non-aneurysmal abdominal aorta, without hemodynamically significant stenosis. --No retroperitoneal lymphadenopathy. --No mesenteric lymphadenopathy. --No pelvic or inguinal lymphadenopathy. Reproductive: The patient is status post prior prostatectomy. Other: No ascites or free air. There is a fat containing left inguinal hernia. Musculoskeletal. There is an age-indeterminate compression fracture of the L3 vertebral body, new since 2019. IMPRESSION: 1. Mild bilateral hydroureteronephrosis to  the level of the urinary bladder. There is no evidence for an obstructing stone. 2. Rectosigmoid diverticulosis without acute inflammation. 3. Fat containing left inguinal hernia. Aortic Atherosclerosis (ICD10-I70.0). Electronically Signed   By: Constance Holster M.D.   On: 05/28/2020 18:59    Procedures .Critical Care Performed by: Noemi Chapel, MD Authorized by: Noemi Chapel, MD   Critical care provider statement:    Critical care time (minutes):  35   Critical care time was exclusive of:  Separately billable procedures and treating other patients and teaching time   Critical care was necessary to treat or prevent imminent or life-threatening deterioration of the following conditions:  Sepsis   Critical care was time spent personally by me on the following activities:  Blood draw for specimens, development of treatment plan with patient or surrogate, discussions with consultants, evaluation of patient's response to treatment, examination of patient, obtaining history from patient or surrogate, ordering and performing treatments and interventions, ordering and review of laboratory studies, ordering and review of radiographic studies, pulse oximetry, re-evaluation of patient's condition and review of old charts   (including critical care time)  Medications Ordered in ED Medications - No data to display  ED Course  I have reviewed the triage vital signs and the nursing notes.  Pertinent labs & imaging results that were available during my care of the patient were reviewed by me and considered in my medical decision making (see chart for details).    MDM Rules/Calculators/A&P  This patient presents to the ED for concern of urinary retention, some fluid gain, this involves an extensive number of treatment options, and is a complaint that carries with it a high risk of complications and morbidity.  The differential diagnosis includes worsening renal function,  continue obstruction secondary to malfunctioning catheter, possible congestive heart failure though seems less likely given history, hypertensive urgency given blood pressure of 191/83   Lab Tests:   I Ordered, reviewed, and interpreted labs, which included metabolic panel, CBC, urinalysis, chest x-ray, liver function and albumin level, recheck renal function  Medicines ordered:   I ordered medication Lasix for fluid retention  Imaging Studies ordered:   I ordered imaging studies which included portable chest x-ray and  I independently visualized and interpreted imaging which showed no acute findings other than his constant mild hydronephrosis.  Additional history obtained:   Additional history obtained from electronic medical record and the nursing assistant at the bedside  Previous records obtained and reviewed   Consultations Obtained:   I consulted with the hospitalist and discussed lab and imaging findings  Reevaluation:  After the interventions stated above, I reevaluated the patient and found to be tachycardic, he has a white count of 17,000 which is new, the patient likely needs to be treated for possible sepsis, cultures and antibiotics were added as well as a lactic acid, the patient truly does not have a fever as he is only 100.2 but because he is borderline tachycardic and has a leukocytosis this will be started. I discussed the case with Dr. Payton Emerald who will admit the patient to the hospital.  Critical Interventions:   Sepsis work-up, urinalysis, cultures, antibiotics, admission   Final Clinical Impression(s) / ED Diagnoses Final diagnoses:  Pyelonephritis  Sepsis, due to unspecified organism, unspecified whether acute organ dysfunction present [facility])      Noemi Chapel, MD 05/29/20 2143

## 2020-05-30 DIAGNOSIS — R310 Gross hematuria: Secondary | ICD-10-CM

## 2020-05-30 DIAGNOSIS — N1330 Unspecified hydronephrosis: Secondary | ICD-10-CM

## 2020-05-30 LAB — COMPREHENSIVE METABOLIC PANEL
ALT: 10 U/L (ref 0–44)
AST: 18 U/L (ref 15–41)
Albumin: 3.6 g/dL (ref 3.5–5.0)
Alkaline Phosphatase: 57 U/L (ref 38–126)
Anion gap: 12 (ref 5–15)
BUN: 54 mg/dL — ABNORMAL HIGH (ref 8–23)
CO2: 21 mmol/L — ABNORMAL LOW (ref 22–32)
Calcium: 8.7 mg/dL — ABNORMAL LOW (ref 8.9–10.3)
Chloride: 103 mmol/L (ref 98–111)
Creatinine, Ser: 3.85 mg/dL — ABNORMAL HIGH (ref 0.61–1.24)
GFR calc Af Amer: 16 mL/min — ABNORMAL LOW (ref >60)
GFR calc non Af Amer: 14 mL/min — ABNORMAL LOW (ref >60)
Glucose, Bld: 114 mg/dL — ABNORMAL HIGH (ref 70–99)
Potassium: 4.4 mmol/L (ref 3.5–5.1)
Sodium: 136 mmol/L (ref 135–145)
Total Bilirubin: 0.4 mg/dL (ref 0.3–1.2)
Total Protein: 7.5 g/dL (ref 6.5–8.1)

## 2020-05-30 LAB — CBC
HCT: 28.2 % — ABNORMAL LOW (ref 39.0–52.0)
Hemoglobin: 9.1 g/dL — ABNORMAL LOW (ref 13.0–17.0)
MCH: 32.4 pg (ref 26.0–34.0)
MCHC: 32.3 g/dL (ref 30.0–36.0)
MCV: 100.4 fL — ABNORMAL HIGH (ref 80.0–100.0)
Platelets: 144 10*3/uL — ABNORMAL LOW (ref 150–400)
RBC: 2.81 MIL/uL — ABNORMAL LOW (ref 4.22–5.81)
RDW: 14.6 % (ref 11.5–15.5)
WBC: 21.4 10*3/uL — ABNORMAL HIGH (ref 4.0–10.5)
nRBC: 0 % (ref 0.0–0.2)

## 2020-05-30 LAB — MAGNESIUM: Magnesium: 1.8 mg/dL (ref 1.7–2.4)

## 2020-05-30 LAB — PHOSPHORUS: Phosphorus: 4 mg/dL (ref 2.5–4.6)

## 2020-05-30 MED ORDER — ACETAMINOPHEN 325 MG PO TABS
650.0000 mg | ORAL_TABLET | Freq: Four times a day (QID) | ORAL | Status: DC | PRN
  Administered 2020-05-30 – 2020-06-02 (×4): 650 mg via ORAL
  Filled 2020-05-30 (×4): qty 2

## 2020-05-30 MED ORDER — SODIUM CHLORIDE 0.9 % IV SOLN
1000.0000 mL | INTRAVENOUS | Status: DC
  Administered 2020-05-30: 1000 mL via INTRAVENOUS

## 2020-05-30 MED ORDER — CHLORHEXIDINE GLUCONATE CLOTH 2 % EX PADS
6.0000 | MEDICATED_PAD | Freq: Every day | CUTANEOUS | Status: DC
  Administered 2020-05-30 – 2020-06-04 (×5): 6 via TOPICAL

## 2020-05-30 NOTE — Progress Notes (Signed)
Initial Nutrition Assessment  DOCUMENTATION CODES:      INTERVENTION:  Recommend liberalize diet   Recommend daily standing weights for clear baseline  NUTRITION DIAGNOSIS:    (Decreased protein needs) related to chronic illness (CKD stage IV) as evidenced by estimated needs (clinical guidelines).  GOAL:   (Meet >90% energy needs while preserving baseline renal function)   MONITOR:  PO intake, Weight trends, Skin, Labs  REASON FOR ASSESSMENT:   Malnutrition Screening Tool    ASSESSMENT: Patient is an 86 yo male with hx of CKD-stage IV, HLD, HTN, prostate cancer (1999). He presents complaining of urinary pain and hematuria.    Caregiver at bedside feeding patient during RD visit. He has care support which prepare his meals but at baseline he is able to feed himself. Good appetite endorsed and he has consumed ~ 50% of meals during the time we met. His habit is rising around 10 am and eats a brunch type meal and a meal later in the evening. Also, has snacks as desired in between.   Patient actual weight unknown. He has 5 weight entries of 81.6 kg and 5 entries in the 79-87 kg range. Recommend obtain daily standing weights to establish a baseline. Estimated his nutrition needs based on 81.6 kg.    Medications reviewed and include: allopurinol, Protonix, zoloft, crestor.  Labs: BMP Latest Ref Rng & Units 05/30/2020 05/29/2020 05/28/2020  Glucose 70 - 99 mg/dL 114(H) 126(H) 111(H)  BUN 8 - 23 mg/dL 54(H) 54(H) 54(H)  Creatinine 0.61 - 1.24 mg/dL 3.85(H) 3.81(H) 3.73(H)  Sodium 135 - 145 mmol/L 136 132(L) 131(L)  Potassium 3.5 - 5.1 mmol/L 4.4 5.1 4.5  Chloride 98 - 111 mmol/L 103 100 96(L)  CO2 22 - 32 mmol/L 21(L) 20(L) 21(L)  Calcium 8.9 - 10.3 mg/dL 8.7(L) 9.0 8.5(L)     NUTRITION - FOCUSED PHYSICAL EXAM:  Unable to complete Nutrition-Focused physical exam at this time.    Diet Order:   Diet Order            Diet renal with fluid restriction Fluid restriction: 1200 mL  Fluid; Room service appropriate? Yes; Fluid consistency: Thin  Diet effective now                 EDUCATION NEEDS:  No education needs have been identified at this time Skin:  Skin Assessment: Reviewed RN Assessment  Last BM:  6/28  Height:   Ht Readings from Last 1 Encounters:  05/29/20 '5\' 8"'  (1.727 m)    Weight:   Wt Readings from Last 1 Encounters:  05/29/20 81.6 kg    Ideal Body Weight:   70 kg  BMI:  Body mass index is 27.37 kg/m.  Estimated Nutritional Needs:   Kcal:  1655-3748  Protein:  49-56 gr  Fluid:  1200 ml per MD  Colman Cater MS,RD,CSG,LDN Pager: Shea Evans

## 2020-05-30 NOTE — Progress Notes (Signed)
PROGRESS NOTE    [name] [name]  JKK:[id number] DOB: 04/13/1934 DOA: 05/29/2020 PCP: [name], MD   Chief Complaint  Patient presents with  . Urinary Retention    Brief Narrative:  As per H&P written by Dr. [name] on 05/29/2020 86 y.o. male with medical history significant of DVT, prostate cancer, CAD, Nephrolithiasis, CKD/ESRD, HTN, HLD. Pt presenting w/ 2 day h/o hematuria. Passing red urine and clots. Presented ot [facility] 1 day ago and was dx w/ hydronephrosis and hematuria. Foley catheter was placed and pt placed on Keflex though definitive UTI was not appreciated. Pt states that since that time he has had increasing penile and groin pain. Pt was evaluated by home aid who told him he was gaining water weight and should get checked out. Pt then came to the Ed. Pt endorses sx as above and fatigue but denies LE swelling, SOB, DOE, CP, palpitations, fevers, myalgias.   Assessment & Plan: 1-UTI/hematuria -Continues spiking fever -Culture pending's -Patient has started to make more urine and it looks clearer. -Urology service consulted and will follow any further recommendations. -Continue IV Rocephin. -Continue as needed antipyretics and supportive care.  2-CAD (coronary artery disease) -No chest pain, no shortness of breath -Continue statins, beta-blocker and risk factor modification.  3-chronic paroxysmal atrial fibrillation -Currently sinus rhythm -Continue beta-blocker for rate control. -Not on chronic anticoagulation.  4-Essential hypertension -Continue current antihypertensive regimen  5-Hyperlipidemia -continue statins.  6-Chronic kidney disease, stage IV (severe) (HCC) -continue sodium bicarbonate -not requiring emergent HD currently  7-GERD -continue PPI  8-depression/anxiety -Stable mood, no hallucinations, no suicidal ideation. -Continue anxiolytic and antidepressant regimen.   DVT prophylaxis: SCDs. Code Status: Full code. Family Communication:  Caregiver at bedside. Disposition:   Status is: Inpatient  Dispo: The patient is from: Home with caregiver.              Anticipated d/c is to: Home with caregiver.              Anticipated d/c date is: 06/01/20              Patient currently not medically stable; still spiking high-grade temperature and complaining of suprapubic tenderness.  Some improvement no hematuria appreciated.  No nausea or vomiting currently.       Consultants:   Urology service.   Procedures:  See below for x-ray reports.   Antimicrobials:  Rocephin   Subjective: Continue to spike fever, no chest pain, no nausea, no vomiting.  Still complaining of suprapubic tenderness.  Improvement in hematuria appreciated.  Objective: Vitals:   05/30/20 0756 05/30/20 1100 05/30/20 1538 05/30/20 1540  BP:   (!) 144/55   Pulse:   92   Resp:   20   Temp:  (!) 103.7 F (39.8 C) 99.2 F (37.3 C) (!) 102.1 F (38.9 C)  TempSrc:  Rectal Oral Rectal  SpO2: 97%  100%   Weight:      Height:        Intake/Output Summary (Last 24 hours) at 05/30/2020 1906 Last data filed at 05/30/2020 1621 Gross per 24 hour  Intake 934.18 ml  Output 900 ml  Net 34.18 ml   Filed Weights   05/29/20 1231  Weight: 81.6 kg    Examination: General exam: Appears calm and in no major distress; patient reports some suprapubic pain and is a still spiking fever.  No chest pain, no nausea, no vomiting, no shortness of breath. Respiratory system: Clear to auscultation. Respiratory effort normal.  Cardiovascular system: Rate controlled, no JVD, no murmurs, no gallops, no rubs. Gastrointestinal system: Abdomen is nondistended, soft and with positive tenderness to palpation in suprapubic area.  Foley catheter in place. No organomegaly or masses felt. Normal bowel sounds heard. Central nervous system: Alert and oriented. No focal neurological deficits. Extremities: No cyanosis or clubbing.  SCDs in place. Skin: No petechiae. Psychiatry:  Judgement and insight appear normal. Mood & affect appropriate.     Data Reviewed: I have personally reviewed following labs and imaging studies  CBC: Recent Labs  Lab 05/28/20 1709 05/29/20 1524 05/30/20 0509  WBC 6.0 17.0* 21.4*  NEUTROABS 4.4 15.2*  --   HGB 8.8* 9.4* 9.1*  HCT 26.2* 28.3* 28.2*  MCV 96.3 95.9 100.4*  PLT 138* 165 144*    Basic Metabolic Panel: Recent Labs  Lab 05/28/20 1709 05/29/20 1524 05/30/20 0509  NA 131* 132* 136  K 4.5 5.1 4.4  CL 96* 100 103  CO2 21* 20* 21*  GLUCOSE 111* 126* 114*  BUN 54* 54* 54*  CREATININE 3.73* 3.81* 3.85*  CALCIUM 8.5* 9.0 8.7*  MG  --   --  1.8  PHOS  --   --  4.0    GFR: Estimated Creatinine Clearance: 14.1 mL/min (A) (by C-G formula based on SCr of 3.85 mg/dL (H)).  Liver Function Tests: Recent Labs  Lab 05/29/20 1524 05/30/20 0509  AST 32 18  ALT 14 10  ALKPHOS 73 57  BILITOT 1.1 0.4  PROT 8.1 7.5  ALBUMIN 4.1 3.6    CBG: No results for input(s): GLUCAP in the last 168 hours.   Recent Results (from the past 240 hour(s))  Urine culture     Status: Abnormal (Preliminary result)   Collection Time: 05/28/20  4:44 PM   Specimen: Urine, Catheterized  Result Value Ref Range Status   Specimen Description   Final    URINE, CATHETERIZED Performed at [facility], [address] [place], [place] [address]    Special Requests   Final    NONE Performed at [facility], [address] [place], [place] [address]    Culture 20,000 COLONIES/mL PSEUDOMONAS AERUGINOSA (A)  Final   Report Status PENDING  Incomplete  SARS Coronavirus 2 by RT PCR (hospital order, performed in [place] hospital lab) Nasopharyngeal Nasopharyngeal Swab     Status: None   Collection Time: 05/29/20  9:36 PM   Specimen: Nasopharyngeal Swab  Result Value Ref Range Status   SARS Coronavirus 2 NEGATIVE NEGATIVE Final    Comment: (NOTE) SARS-CoV-2 target nucleic acids are NOT DETECTED.  The SARS-CoV-2 RNA is generally  detectable in upper and lower respiratory specimens during the acute phase of infection. The lowest concentration of SARS-CoV-2 viral copies this assay can detect is 250 copies / mL. A negative result does not preclude SARS-CoV-2 infection and should not be used as the sole basis for treatment or other patient management decisions.  A negative result may occur with improper specimen collection / handling, submission of specimen other than nasopharyngeal swab, presence of viral mutation(s) within the areas targeted by this assay, and inadequate number of viral copies (<250 copies / mL). A negative result must be combined with clinical observations, patient history, and epidemiological information.  Fact Sheet for Patients:   StrictlyIdeas.no  Fact Sheet for Healthcare Providers: BankingDealers.co.za  This test is not yet approved or  cleared by the [place] FDA and has been authorized for detection and/or diagnosis of SARS-CoV-2 by FDA under  an Emergency Use Authorization (EUA).  This EUA will remain in effect (meaning this test can be used) for the duration of the COVID-19 declaration under Section 564(b)(1) of the Act, 21 U.S.C. section 360bbb-3(b)(1), unless the authorization is terminated or revoked sooner.  Performed at [facility], [address] [place], [place] [address]   Blood culture (routine x 2)     Status: None (Preliminary result)   Collection Time: 05/29/20  9:38 PM   Specimen: BLOOD RIGHT ARM  Result Value Ref Range Status   Specimen Description BLOOD RIGHT ARM  Final   Special Requests   Final    BOTTLES DRAWN AEROBIC AND ANAEROBIC Blood Culture adequate volume   Culture   Final    NO GROWTH < 12 HOURS Performed at [facility], [address], [place], [place] [address]    Report Status PENDING  Incomplete  Blood culture (routine x 2)     Status: None (Preliminary result)   Collection Time: 05/29/20  9:49 PM    Specimen: BLOOD RIGHT HAND  Result Value Ref Range Status   Specimen Description BLOOD RIGHT HAND  Final   Special Requests   Final    BOTTLES DRAWN AEROBIC AND ANAEROBIC Blood Culture adequate volume   Culture   Final    NO GROWTH < 12 HOURS Performed at [facility], [address] [place], [place] [address]    Report Status PENDING  Incomplete     Radiology Studies: CT ABDOMEN PELVIS WO CONTRAST  Result Date: 05/29/2020 CLINICAL DATA:  Bloody drainage into catheter bag no urine output EXAM: CT ABDOMEN AND PELVIS WITHOUT CONTRAST TECHNIQUE: Multidetector CT imaging of the abdomen and pelvis was performed following the standard protocol without IV contrast. COMPARISON:  CT 05/28/2020, 07/19/2018, lumbar radiograph 04/13/2020 FINDINGS: Lower chest: Lung bases demonstrate no acute consolidation or effusion. Coronary vascular calcification. Hepatobiliary: No focal liver abnormality is seen. No gallstones, gallbladder wall thickening, or biliary dilatation. Pancreas: Unremarkable. No pancreatic ductal dilatation or surrounding inflammatory changes. Spleen: Normal in size without focal abnormality. Adrenals/Urinary Tract: Adrenal glands are within normal limits. Similar mild bilateral hydronephrosis and hydroureter down to the level of the bladder without obstructing stone. Bladder nearly empty. Foley catheter in the urinary bladder. Stomach/Bowel: The stomach is nonenlarged. No dilated small bowel. No bowel wall thickening. Negative appendix. Descending and sigmoid colon diverticula without acute inflammatory change. Vascular/Lymphatic: Advanced aortic atherosclerosis without aneurysm. No suspicious nodes. Reproductive: Prostatectomy. Other: No free air or free fluid. Fat containing left inguinal hernia. Musculoskeletal: Moderate superior endplate compression deformity at L3 IMPRESSION: 1. Similar mild bilateral hydronephrosis and hydroureter down to the level of the bladder without obstructing stone  identified. Bladder is nearly empty with Foley catheter in place. 2. Descending and sigmoid colon diverticula without acute inflammatory change. 3. Moderate superior endplate compression deformity at L3, age indeterminate. Aortic Atherosclerosis (ICD10-I70.0). Electronically Signed   By: Donavan Foil M.D.   On: 05/29/2020 19:10   DG Chest Port 1 View  Result Date: 05/29/2020 CLINICAL DATA:  Shortness of breath.  Fluid retention. EXAM: PORTABLE CHEST 1 VIEW COMPARISON:  09/07/2018 FINDINGS: Chronic interstitial lung markings are noted bilaterally. There is no focal infiltrate. The heart size is normal. There is no pneumothorax. No large pleural effusion. There is an old left clavicle fracture. Atherosclerotic changes are noted of the thoracic aorta. IMPRESSION: No active disease. Electronically Signed   By: Constance Holster M.D.   On: 05/29/2020 15:43    Scheduled Meds: . allopurinol  50 mg Oral Daily  . ALPRAZolam  0.5 mg Oral BID  . amLODipine  10 mg Oral Daily  . ARIPiprazole  20 mg Oral Daily  . carvedilol  6.25 mg Oral BID  . Chlorhexidine Gluconate Cloth  6 each Topical Daily  . gabapentin  100 mg Oral QHS  . pantoprazole  40 mg Oral Daily  . pneumococcal 23 valent vaccine  0.5 mL Intramuscular Tomorrow-1000  . rosuvastatin  10 mg Oral Daily  . sertraline  100 mg Oral Daily  . sodium bicarbonate  650 mg Oral TID with meals   Continuous Infusions: . sodium chloride 1,000 mL (05/30/20 1901)  . cefTRIAXone (ROCEPHIN)  IV       LOS: 1 day    Time spent: 30 minutes    Barton Dubois, MD [facility]   To contact the attending provider between 7A-7P or the covering provider during after hours 7P-7A, please log into the web site [email or web address] and access using universal [place] password for that web site. If you do not have the password, please call the hospital operator.  05/30/2020, 7:06 PM

## 2020-05-30 NOTE — Consult Note (Signed)
Urology Consult  Consulting MD: Payton Emerald, MD  CC: Gross hematuria, hydronephrosis  HPI: This is a [name] [name] admitted recently for gross hematuria, bladder pain and hydronephrosis on CT scan.  Urologic consultation is requested.  The patient does have a history of prostate cancer.  He is status post radical prostatectomy in 1999 with positive margins.  He subsequently received salvage radiotherapy.  Apparently, his PSA has been fairly stable.  The patient did have a bladder neck contracture treated postoperatively and eventually had an artificial urinary sphincter placed in 2012.  In 2019, he developed radiation cystitis with subsequent gross hematuria.  Because of need for bladder irrigation, his artificial urinary sphincter was removed.  Since then, he has been leaking constantly.  He does have a history of bilateral nephrostomy tubes for hydronephrosis.  Nephrostomy tubes did not, apparently, improve the patient's renal function.  There was subsequently removed after capping them in mid 2020.  He apparently has not had any subsequent flank pain.  The patient has had DVT in the past.  He is not currently on blood thinners.  PMH: Past Medical History:  Diagnosis Date  . Anxiety   . 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   . Cancer ([place])    prostate ca 1999/removal/ rad tx  . Carotid artery occlusion   . Depression   . DVT (deep venous thrombosis) ([place])   . Gout   . History of kidney stones   . Hyperlipidemia   . Hypertension   . Incontinence   . Reflux   . Renal disorder   . Status post implantation of artificial urinary sphincter   . Wears dentures   . Wears glasses     PSH: Past Surgical History:  Procedure Laterality Date  . AV FISTULA PLACEMENT Left 05/15/2020   Procedure: LEFT FOREARM  ARTERIOVENOUS (AV) GRAFT INSERTION;  Surgeon: Angelia Mould, MD;  Location: [facility];  Service:  Vascular;  Laterality: Left;  . CARDIAC CATHETERIZATION     2000-2001  . cataracts    . CORONARY STENT PLACEMENT    . HERNIA REPAIR    . MULTIPLE TOOTH EXTRACTIONS    . NEPHROSTOMY    . radical prostectomy    . URINARY SPHINCTER IMPLANT      Allergies: Allergies  Allergen Reactions  . Ivp Dye [Iodinated Diagnostic Agents]     Due to partial renal failure  . Nalbuphine Other (See Comments)    PATIENT STATES IT MADE HIM FEEL LIKE HE WAS GOING TO DIE. NO SPECIFICS  . Codeine Anxiety    Medications: Medications Prior to Admission  Medication Sig Dispense Refill Last Dose  . acetaminophen (TYLENOL) 500 MG tablet Take 1,000 mg by mouth every 8 (eight) hours as needed for mild pain or moderate pain. For headache pain    Past Week at unk  . ALFALFA PO Take 250 mg by mouth 2 (two) times daily.    05/29/2020 at Unknown time  . allopurinol (ZYLOPRIM) 100 MG tablet Take 100 mg by mouth daily.   0 05/29/2020 at Unknown time  . ALPRAZolam (XANAX) 0.5 MG tablet Take 0.5 mg by mouth 2 (two) times daily.   5 Past Week at Unknown time  . amLODipine (NORVASC) 10 MG tablet Take 10 mg by mouth daily.   05/29/2020 at Unknown time  . ARIPiprazole (ABILIFY) 20 MG tablet Take 20 mg by mouth daily.  05/29/2020 at Unknown time  . carvedilol (COREG) 3.125 MG tablet Take 6.25 mg by mouth 2 (two) times daily.   05/29/2020 at 0800  . fish oil-omega-3 fatty acids 1000 MG capsule Take 1 g by mouth daily.    05/29/2020 at Unknown time  . furosemide (LASIX) 40 MG tablet Take 40 mg by mouth daily as needed.   05/29/2020 at Unknown time  . gabapentin (NEURONTIN) 100 MG capsule Take 100 mg by mouth at bedtime.   05/28/2020 at Unknown time  . HYDROcodone-acetaminophen (NORCO) 5-325 MG tablet Take 1 tablet by mouth every 6 (six) hours as needed for moderate pain. 20 tablet 0 05/29/2020 at 0900  . lansoprazole (PREVACID) 15 MG capsule Take 15 mg by mouth 2 (two) times daily before a meal.    05/29/2020 at Unknown time  .  meclizine (ANTIVERT) 12.5 MG tablet Take 12.5 mg by mouth daily.   05/29/2020 at Unknown time  . Multiple Vitamin (MULTIVITAMIN WITH MINERALS) TABS tablet Take 1 tablet by mouth daily.   05/29/2020 at Unknown time  . ofloxacin (FLOXIN) 0.3 % OTIC solution Place 5 drops into both ears daily.   2 05/29/2020 at Unknown time  . Probiotic Product (RISA-BID PROBIOTIC) TABS Give 1 tablet by mouth twice a day from 09/14/2018-09/24/2018   05/28/2020 at Unknown time  . rosuvastatin (CRESTOR) 10 MG tablet Take 10 mg by mouth daily.    05/28/2020 at Unknown time  . sertraline (ZOLOFT) 100 MG tablet Take 100 mg by mouth daily.   05/29/2020 at Unknown time  . sodium bicarbonate 650 MG tablet Take 650 mg by mouth 3 (three) times daily.   1 05/29/2020 at Unknown time  . traMADol (ULTRAM) 50 MG tablet Take 1 tablet (50 mg total) by mouth every 6 (six) hours as needed. 15 tablet 0 Past Week at unk  . ceFEPIme 1 g in sodium chloride 0.9 % 100 mL Inject 1 g into the vein daily. 7 Dose 0   . epoetin alfa (EPOGEN) 3000 UNIT/ML injection Inject 3,000 Units into the vein every 14 (fourteen) days.        Social History: Social History   Socioeconomic History  . Marital status: Widowed    Spouse name: Not on file  . Number of children: Not on file  . Years of education: Not on file  . Highest education level: Not on file  Occupational History  . Occupation: retired  Tobacco Use  . Smoking status: Former Smoker    Packs/day: 2.00    Types: Cigarettes    Start date: 08/05/1962    Quit date: 12/02/1972    Years since quitting: 47.5  . Smokeless tobacco: Never Used  Vaping Use  . Vaping Use: Never used  Substance and Sexual Activity  . Alcohol use: No  . Drug use: No  . Sexual activity: Not on file  Other Topics Concern  . Not on file  Social History Narrative  . Not on file   Social Determinants of Health   Financial Resource Strain:   . Difficulty of Paying Living Expenses:   Food Insecurity:   . Worried  About Charity fundraiser in the Last Year:   . Arboriculturist in the Last Year:   Transportation Needs:   . Film/video editor (Medical):   Marland Kitchen Lack of Transportation (Non-Medical):   Physical Activity:   . Days of Exercise per Week:   . Minutes of Exercise per Session:   Stress:   .  Feeling of Stress :   Social Connections:   . Frequency of Communication with Friends and Family:   . Frequency of Social Gatherings with Friends and Family:   . Attends Religious Services:   . Active Member of Clubs or Organizations:   . Attends Archivist Meetings:   Marland Kitchen Marital Status:   Intimate Partner Violence:   . Fear of Current or Ex-Partner:   . Emotionally Abused:   Marland Kitchen Physically Abused:   . Sexually Abused:     Family History: Family History  Problem Relation Age of Onset  . Coronary artery disease Other        family history, [name] < 38  . Arthritis Other        family history  . Cancer Other   . Kidney disease Other     Review of Systems: Positive: Gross hematuria.  Currently, patient not complaining about any bladder pain and his catheter seems to be draining adequately. Negative:   A further 10 point review of systems was negative except what is listed in the HPI.  Physical Exam: @VITALS2 @ General: No acute distress.  Awake. Head:  Normocephalic.  Atraumatic. ENT:  EOMI.  Mucous membranes moist Neck:  Supple.  No lymphadenopathy. CV:  Regular rate. Pulmonary: Equal effort bilaterally.   Skin:  Normal turgor.  No visible rash. Extremity: No gross deformity of extremities.  Neurologic: Alert. Appropriate mood.  Urine within the catheter bag peach colored.  No active clots.  Studies:  Recent Labs    05/29/20 1524 05/30/20 0509  HGB 9.4* 9.1*  WBC 17.0* 21.4*  PLT 165 144*    Recent Labs    05/29/20 1524 05/30/20 0509  NA 132* 136  K 5.1 4.4  CL 100 103  CO2 20* 21*  BUN 54* 54*  CREATININE 3.81* 3.85*  CALCIUM 9.0 8.7*  GFRNONAA 14* 14*   GFRAA 16* 16*    .  Urine culture growing Pseudomonas, 20,000 colonies.  I have reviewed the patient's CT images from this visit as well as prior images from CT from 2019.  He has stable bilateral mild hydroureteronephrosis.  Evidence of prior prostate surgery.   Assessment: 1.  Gross hematuria, seems to be clearing.  Most likely secondary to radiation change of the bladder.  2.  Pseudomonal UTI  Plan: 1.  At this point, I am not worried about the patient's mild hydronephrosis on CT scan.  This is bilateral, and stable since 2019.  This has been evaluated and found to be nonobstructive at [facility]  2.  Regarding his hematuria, this seems to be clearing.  I think it is okay to remove the catheter at any point for trial of emptying/voiding  3.  If he continues to do well, DC at your leisure.  Reconsult urology as necessary.    Pager:[phone]

## 2020-05-31 ENCOUNTER — Encounter (HOSPITAL_COMMUNITY): Payer: MEDICARE

## 2020-05-31 ENCOUNTER — Encounter (HOSPITAL_COMMUNITY)
Admission: RE | Admit: 2020-05-31 | Discharge: 2020-05-31 | Disposition: A | Discharge status: Home / Self Care | Payer: MEDICARE | Source: Ambulatory Visit | Attending: Nephrology | Admitting: Nephrology

## 2020-05-31 DIAGNOSIS — R310 Gross hematuria: Secondary | ICD-10-CM

## 2020-05-31 DIAGNOSIS — N1330 Unspecified hydronephrosis: Secondary | ICD-10-CM

## 2020-05-31 LAB — BLOOD CULTURE ID PANEL (REFLEXED)

## 2020-05-31 LAB — HEMOGLOBIN A1C
Hgb A1c MFr Bld: 5.5 % (ref 4.8–5.6)
Mean Plasma Glucose: 111 mg/dL

## 2020-05-31 LAB — URINE CULTURE: Culture: 20000 — AB

## 2020-05-31 MED ORDER — ALPRAZOLAM 0.5 MG PO TABS
0.5000 mg | ORAL_TABLET | Freq: Two times a day (BID) | ORAL | Status: DC | PRN
  Administered 2020-06-05: 0.5 mg via ORAL
  Filled 2020-05-31: qty 1

## 2020-05-31 MED ORDER — SODIUM CHLORIDE 0.9 % IV SOLN
2.0000 g | Freq: Once | INTRAVENOUS | Status: AC
  Administered 2020-05-31: 2 g via INTRAVENOUS
  Filled 2020-05-31: qty 2

## 2020-05-31 MED ORDER — SODIUM CHLORIDE 0.9 % IV SOLN: 2.0000 g | INTRAVENOUS | Status: DC

## 2020-05-31 MED ORDER — SODIUM CHLORIDE 0.9 % IV SOLN
1000.0000 mL | INTRAVENOUS | Status: DC
  Administered 2020-05-31 – 2020-06-06 (×4): 1000 mL via INTRAVENOUS

## 2020-05-31 MED ORDER — SODIUM CHLORIDE 0.9 % IV SOLN
0.5000 g | Freq: Four times a day (QID) | INTRAVENOUS | Status: DC
  Administered 2020-05-31 – 2020-06-06 (×21): 0.5 g via INTRAVENOUS
  Filled 2020-05-31 (×28): qty 0.5

## 2020-05-31 NOTE — Progress Notes (Signed)
PROGRESS NOTE    JARREAU CALLANAN  ESP:[id number] DOB: [birth year] DOA: 05/29/2020 PCP: [name], MD   Chief Complaint  Patient presents with  . Urinary Retention    Brief Narrative:  As per H&P written by Dr. [name] on 05/29/2020 86 y.o. male with medical history significant of DVT, prostate cancer, CAD, Nephrolithiasis, CKD/ESRD, HTN, HLD. Pt presenting w/ 2 day h/o hematuria. Passing red urine and clots. Presented ot [facility] 1 day ago and was dx w/ hydronephrosis and hematuria. Foley catheter was placed and pt placed on Keflex though definitive UTI was not appreciated. Pt states that since that time he has had increasing penile and groin pain. Pt was evaluated by home aid who told him he was gaining water weight and should get checked out. Pt then came to the Ed. Pt endorses sx as above and fatigue but denies LE swelling, SOB, DOE, CP, palpitations, fevers, myalgias.   Assessment & Plan:  1)Pseudomonas UTI--urine culture this admission from 05/28/2020 with MDR Pseudomonas Aeruginosa (resistant to imipenem, Cipro, intermediate to [place], sensitive to gentamicin)  -Discussed with pharmacist, will request further antibiotic sensitivities to be run by lab/micro department -Stop Rocephin, treat empirically with cefepime for now --Fevers resolving -Antimicrobials this admission: Cefepime 6/30 >>  CTX 6/28 >> 6/30 Microbiology results: 6/28 BCx: ngtd 6/27 UCx: pseudomonas   2)Gross Hematuria--hematuria resolving after removal of Foley catheter on 05/30/2020 -Urology input appreciated -Nocturia may have been secondary to UTI  3)-CAD (coronary artery disease) -No chest pain, no shortness of breath -Continue Crestor 10 mg daily,, Coreg 6.25 mg twice daily,  4-Essential hypertension -BP not quite at goal, continue amlodipine 10 mg daily, continue Coreg 6.25 mg twice daily, stop Lasix  5--Chronic kidney disease, stage IV (severe) (HCC) -continue sodium bicarbonate 650 mg 3 times  daily -not requiring emergent HD currently -Serum creatinine less than 4, around patient's baseline  6)-chronic paroxysm atrial fibrillation -Currently sinus rhythm Continue Coreg 6.25 mg twice daily for rate control. -Not on chronic anticoagulation--- given hematuria will not initiate anticoagulation  7-GERD -continue Protonix  8-depression/anxiety -Stable mood, no hallucinations, no suicidal ideation. -Continue Abilify 20 mg daily, Zoloft 100 mg daily and Xanax as needed and antidepressant regimen.  9) chronic anemia---suspect underlying anemia of CKD, Hgb stable around 9 -May need ESA/EPO agent as outpatient   DVT prophylaxis: SCDs. Code Status: Full code. Family Communication: Caregiver at bedside. Disposition:   Status is: Inpatient  Dispo: The patient is from: Home with caregiver.              Anticipated d/c is to: Home with caregiver.              Anticipated d/c date is: 06/01/20              Patient currently not medically stable; still spiking high-grade temperature and complaining of suprapubic tenderness.  Some improvement no hematuria appreciated.  No nausea or vomiting currently.       Consultants:   Urology service.   Procedures:  See below for x-ray reports.   Antimicrobials:  Rocephin   Subjective: Overall fever trending down -Hematuria improving -Lethargy improving  Objective: Vitals:   05/30/20 2110 05/30/20 2226 05/31/20 0620 05/31/20 0844  BP: (!) 127/38 (!) 136/56 (!) 157/55   Pulse: 81 89 96   Resp:      Temp: 99.2 F (37.3 C)  98 F (36.7 C)   TempSrc: Oral  Oral   SpO2: 94%  96% 95%  Weight:      Height:        Intake/Output Summary (Last 24 hours) at 05/31/2020 1410 Last data filed at 05/31/2020 0900 Gross per 24 hour  Intake 666.92 ml  Output 700 ml  Net -33.08 ml   Filed Weights   05/29/20 1231  Weight: 81.6 kg    Examination: General exam: Somewhat sleepy, in no acute distress Respiratory system: Clear to  auscultation. Respiratory effort normal. Cardiovascular system: Rate controlled, no JVD, no murmurs, no gallops, no rubs. Gastrointestinal system: Abdomen is nondistended, soft and with positive tenderness to palpation in suprapubic area.  Foley catheter in place.  . Normal bowel sounds heard. Central nervous system: Alert and oriented. No focal neurological deficits. Extremities: No cyanosis or clubbing.  SCDs in place. Skin: No petechiae. Psychiatry: Judgement and insight appear normal. Mood & affect appropriate. GU--Foley is out, no significant hematuria at this time with voiding    Data Reviewed:   CBC: Recent Labs  Lab 05/28/20 1709 05/29/20 1524 05/30/20 0509  WBC 6.0 17.0* 21.4*  NEUTROABS 4.4 15.2*  --   HGB 8.8* 9.4* 9.1*  HCT 26.2* 28.3* 28.2*  MCV 96.3 95.9 100.4*  PLT 138* 165 144*    Basic Metabolic Panel: Recent Labs  Lab 05/28/20 1709 05/29/20 1524 05/30/20 0509  NA 131* 132* 136  K 4.5 5.1 4.4  CL 96* 100 103  CO2 21* 20* 21*  GLUCOSE 111* 126* 114*  BUN 54* 54* 54*  CREATININE 3.73* 3.81* 3.85*  CALCIUM 8.5* 9.0 8.7*  MG  --   --  1.8  PHOS  --   --  4.0    GFR: Estimated Creatinine Clearance: 14.1 mL/min (A) (by C-G formula based on SCr of 3.85 mg/dL (H)).  Liver Function Tests: Recent Labs  Lab 05/29/20 1524 05/30/20 0509  AST 32 18  ALT 14 10  ALKPHOS 73 57  BILITOT 1.1 0.4  PROT 8.1 7.5  ALBUMIN 4.1 3.6    CBG: No results for input(s): GLUCAP in the last 168 hours.   Recent Results (from the past 240 hour(s))  Urine culture     Status: Abnormal   Collection Time: 05/28/20  4:44 PM   Specimen: Urine, Catheterized  Result Value Ref Range Status   Specimen Description   Final    URINE, CATHETERIZED Performed at [facility], [address], [place], [place] [address]    Special Requests   Final    NONE Performed at [facility], [address], [place],  [address]    Culture 20,000 COLONIES/mL PSEUDOMONAS AERUGINOSA  (A)  Final   Report Status 05/31/2020 FINAL  Final   Organism ID, Bacteria PSEUDOMONAS AERUGINOSA (A)  Final      Susceptibility   Pseudomonas aeruginosa - MIC*    CEFTAZIDIME 16 INTERMEDIATE Intermediate     CIPROFLOXACIN >=4 RESISTANT Resistant     GENTAMICIN 4 SENSITIVE Sensitive     IMIPENEM >=16 RESISTANT Resistant     * 20,000 COLONIES/mL PSEUDOMONAS AERUGINOSA  SARS Coronavirus 2 by RT PCR (hospital order, performed in [place] hospital lab) Nasopharyngeal Nasopharyngeal Swab     Status: None   Collection Time: 05/29/20  9:36 PM   Specimen: Nasopharyngeal Swab  Result Value Ref Range Status   SARS Coronavirus 2 NEGATIVE NEGATIVE Final    Comment: (NOTE) SARS-CoV-2 target nucleic acids are NOT DETECTED.  The SARS-CoV-2 RNA is generally detectable in upper and lower respiratory specimens during the acute phase of infection. The  lowest concentration of SARS-CoV-2 viral copies this assay can detect is 250 copies / mL. A negative result does not preclude SARS-CoV-2 infection and should not be used as the sole basis for treatment or other patient management decisions.  A negative result may occur with improper specimen collection / handling, submission of specimen other than nasopharyngeal swab, presence of viral mutation(s) within the areas targeted by this assay, and inadequate number of viral copies (<250 copies / mL). A negative result must be combined with clinical observations, patient history, and epidemiological information.  Fact Sheet for Patients:   StrictlyIdeas.no  Fact Sheet for Healthcare Providers: BankingDealers.co.za  This test is not yet approved or  cleared by the [place] FDA and has been authorized for detection and/or diagnosis of SARS-CoV-2 by FDA under an Emergency Use Authorization (EUA).  This EUA will remain in effect (meaning this test can be used) for the duration of the COVID-19 declaration  under Section 564(b)(1) of the Act, 21 U.S.C. section 360bbb-3(b)(1), unless the authorization is terminated or revoked sooner.  Performed at [facility], [address], [place], [place] [address]   Blood culture (routine x 2)     Status: None (Preliminary result)   Collection Time: 05/29/20  9:38 PM   Specimen: BLOOD RIGHT ARM  Result Value Ref Range Status   Specimen Description BLOOD RIGHT ARM  Final   Special Requests   Final    BOTTLES DRAWN AEROBIC AND ANAEROBIC Blood Culture adequate volume   Culture   Final    NO GROWTH < 12 HOURS Performed at [facility], [address] [place], [place] [address]    Report Status PENDING  Incomplete  Blood culture (routine x 2)     Status: None (Preliminary result)   Collection Time: 05/29/20  9:49 PM   Specimen: BLOOD RIGHT HAND  Result Value Ref Range Status   Specimen Description BLOOD RIGHT HAND  Final   Special Requests   Final    BOTTLES DRAWN AEROBIC AND ANAEROBIC Blood Culture adequate volume   Culture   Final    NO GROWTH < 12 HOURS Performed at [facility] [address], [place], [place] [address]    Report Status PENDING  Incomplete     Radiology Studies: CT ABDOMEN PELVIS WO CONTRAST  Result Date: 05/29/2020 CLINICAL DATA:  Bloody drainage into catheter bag no urine output EXAM: CT ABDOMEN AND PELVIS WITHOUT CONTRAST TECHNIQUE: Multidetector CT imaging of the abdomen and pelvis was performed following the standard protocol without IV contrast. COMPARISON:  CT 05/28/2020, 07/19/2018, lumbar radiograph 04/13/2020 FINDINGS: Lower chest: Lung bases demonstrate no acute consolidation or effusion. Coronary vascular calcification. Hepatobiliary: No focal liver abnormality is seen. No gallstones, gallbladder wall thickening, or biliary dilatation. Pancreas: Unremarkable. No pancreatic ductal dilatation or surrounding inflammatory changes. Spleen: Normal in size without focal abnormality. Adrenals/Urinary Tract: Adrenal glands are  within normal limits. Similar mild bilateral hydronephrosis and hydroureter down to the level of the bladder without obstructing stone. Bladder nearly empty. Foley catheter in the urinary bladder. Stomach/Bowel: The stomach is nonenlarged. No dilated small bowel. No bowel wall thickening. Negative appendix. Descending and sigmoid colon diverticula without acute inflammatory change. Vascular/Lymphatic: Advanced aortic atherosclerosis without aneurysm. No suspicious nodes. Reproductive: Prostatectomy. Other: No free air or free fluid. Fat containing left inguinal hernia. Musculoskeletal: Moderate superior endplate compression deformity at L3 IMPRESSION: 1. Similar mild bilateral hydronephrosis and hydroureter down to the level of the bladder without obstructing stone identified. Bladder is nearly empty  with Foley catheter in place. 2. Descending and sigmoid colon diverticula without acute inflammatory change. 3. Moderate superior endplate compression deformity at L3, age indeterminate. Aortic Atherosclerosis (ICD10-I70.0). Electronically Signed   By: Donavan Foil M.D.   On: 05/29/2020 19:10   DG Chest Port 1 View  Result Date: 05/29/2020 CLINICAL DATA:  Shortness of breath.  Fluid retention. EXAM: PORTABLE CHEST 1 VIEW COMPARISON:  09/07/2018 FINDINGS: Chronic interstitial lung markings are noted bilaterally. There is no focal infiltrate. The heart size is normal. There is no pneumothorax. No large pleural effusion. There is an old left clavicle fracture. Atherosclerotic changes are noted of the thoracic aorta. IMPRESSION: No active disease. Electronically Signed   By: Constance Holster M.D.   On: 05/29/2020 15:43    Scheduled Meds: . allopurinol  50 mg Oral Daily  . ALPRAZolam  0.5 mg Oral BID  . amLODipine  10 mg Oral Daily  . ARIPiprazole  20 mg Oral Daily  . carvedilol  6.25 mg Oral BID  . Chlorhexidine Gluconate Cloth  6 each Topical Daily  . gabapentin  100 mg Oral QHS  . pantoprazole  40 mg  Oral Daily  . pneumococcal 23 valent vaccine  0.5 mL Intramuscular Tomorrow-1000  . rosuvastatin  10 mg Oral Daily  . sertraline  100 mg Oral Daily  . sodium bicarbonate  650 mg Oral TID with meals   Continuous Infusions: . sodium chloride 1,000 mL (05/31/20 0825)  . [START ON 06/01/2020] ceFEPime (MAXIPIME) IV       LOS: 2 days    Roxan Hockey, MD [facility]  05/31/2020, 2:10 PM

## 2020-05-31 NOTE — Progress Notes (Signed)
Subjective: Patient reports clear urine this morning. No suprapubic pain. He denies any significant LUTS  Objective: Vital signs in last 24 hours: Temp:  [98 F (36.7 C)-102.1 F (38.9 C)] 98 F (36.7 C) (06/30 0620) Pulse Rate:  [81-96] 96 (06/30 0620) Resp:  [20] 20 (06/29 1538) BP: (127-157)/(38-56) 157/55 (06/30 0620) SpO2:  [92 %-100 %] 95 % (06/30 0844)  Intake/Output from previous day: 06/29 0701 - 06/30 0700 In: 546.9 [I.V.:446.9; IV Piggyback:100] Out: 700 [Urine:700] Intake/Output this shift: Total I/O In: 120 [P.O.:120] Out: -   Physical Exam:  General:alert, cooperative and appears stated age GI: soft, non tender, normal bowel sounds, no palpable masses, no organomegaly, no inguinal hernia Male genitalia: not done Extremities: extremities normal, atraumatic, no cyanosis or edema  Lab Results: Recent Labs    05/28/20 1709 05/29/20 1524 05/30/20 0509  HGB 8.8* 9.4* 9.1*  HCT 26.2* 28.3* 28.2*   BMET Recent Labs    05/29/20 1524 05/30/20 0509  NA 132* 136  K 5.1 4.4  CL 100 103  CO2 20* 21*  GLUCOSE 126* 114*  BUN 54* 54*  CREATININE 3.81* 3.85*  CALCIUM 9.0 8.7*   No results for input(s): LABPT, INR in the last 72 hours. No results for input(s): LABURIN in the last 72 hours. Results for orders placed or performed during the hospital encounter of 05/29/20  SARS Coronavirus 2 by RT PCR (hospital order, performed in [facility] hospital lab) Nasopharyngeal Nasopharyngeal Swab     Status: None   Collection Time: 05/29/20  9:36 PM   Specimen: Nasopharyngeal Swab  Result Value Ref Range Status   SARS Coronavirus 2 NEGATIVE NEGATIVE Final    Comment: (NOTE) SARS-CoV-2 target nucleic acids are NOT DETECTED.  The SARS-CoV-2 RNA is generally detectable in upper and lower respiratory specimens during the acute phase of infection. The lowest concentration of SARS-CoV-2 viral copies this assay can detect is 250 copies / mL. A negative result does not  preclude SARS-CoV-2 infection and should not be used as the sole basis for treatment or other patient management decisions.  A negative result may occur with improper specimen collection / handling, submission of specimen other than nasopharyngeal swab, presence of viral mutation(s) within the areas targeted by this assay, and inadequate number of viral copies (<250 copies / mL). A negative result must be combined with clinical observations, patient history, and epidemiological information.  Fact Sheet for Patients:   StrictlyIdeas.no  Fact Sheet for Healthcare Providers: BankingDealers.co.za  This test is not yet approved or  cleared by the [place] FDA and has been authorized for detection and/or diagnosis of SARS-CoV-2 by FDA under an Emergency Use Authorization (EUA).  This EUA will remain in effect (meaning this test can be used) for the duration of the COVID-19 declaration under Section 564(b)(1) of the Act, 21 U.S.C. section 360bbb-3(b)(1), unless the authorization is terminated or revoked sooner.  Performed at [facility], [address] [place], [place] [address]   Blood culture (routine x 2)     Status: None (Preliminary result)   Collection Time: 05/29/20  9:38 PM   Specimen: BLOOD RIGHT ARM  Result Value Ref Range Status   Specimen Description BLOOD RIGHT ARM  Final   Special Requests   Final    BOTTLES DRAWN AEROBIC AND ANAEROBIC Blood Culture adequate volume   Culture   Final    NO GROWTH < 12 HOURS Performed at [facility], [address], [place], [place] [address]  Report Status PENDING  Incomplete  Blood culture (routine x 2)     Status: None (Preliminary result)   Collection Time: 05/29/20  9:49 PM   Specimen: BLOOD RIGHT HAND  Result Value Ref Range Status   Specimen Description BLOOD RIGHT HAND  Final   Special Requests   Final    BOTTLES DRAWN AEROBIC AND ANAEROBIC Blood Culture adequate volume    Culture   Final    NO GROWTH < 12 HOURS Performed at [facility], [address] [place], [place] [address]    Report Status PENDING  Incomplete    Studies/Results: CT ABDOMEN PELVIS WO CONTRAST  Result Date: 05/29/2020 CLINICAL DATA:  Bloody drainage into catheter bag no urine output EXAM: CT ABDOMEN AND PELVIS WITHOUT CONTRAST TECHNIQUE: Multidetector CT imaging of the abdomen and pelvis was performed following the standard protocol without IV contrast. COMPARISON:  CT 05/28/2020, 07/19/2018, lumbar radiograph 04/13/2020 FINDINGS: Lower chest: Lung bases demonstrate no acute consolidation or effusion. Coronary vascular calcification. Hepatobiliary: No focal liver abnormality is seen. No gallstones, gallbladder wall thickening, or biliary dilatation. Pancreas: Unremarkable. No pancreatic ductal dilatation or surrounding inflammatory changes. Spleen: Normal in size without focal abnormality. Adrenals/Urinary Tract: Adrenal glands are within normal limits. Similar mild bilateral hydronephrosis and hydroureter down to the level of the bladder without obstructing stone. Bladder nearly empty. Foley catheter in the urinary bladder. Stomach/Bowel: The stomach is nonenlarged. No dilated small bowel. No bowel wall thickening. Negative appendix. Descending and sigmoid colon diverticula without acute inflammatory change. Vascular/Lymphatic: Advanced aortic atherosclerosis without aneurysm. No suspicious nodes. Reproductive: Prostatectomy. Other: No free air or free fluid. Fat containing left inguinal hernia. Musculoskeletal: Moderate superior endplate compression deformity at L3 IMPRESSION: 1. Similar mild bilateral hydronephrosis and hydroureter down to the level of the bladder without obstructing stone identified. Bladder is nearly empty with Foley catheter in place. 2. Descending and sigmoid colon diverticula without acute inflammatory change. 3. Moderate superior endplate compression deformity at L3, age  indeterminate. Aortic Atherosclerosis (ICD10-I70.0). Electronically Signed   By: Donavan Foil M.D.   On: 05/29/2020 19:10   DG Chest Port 1 View  Result Date: 05/29/2020 CLINICAL DATA:  Shortness of breath.  Fluid retention. EXAM: PORTABLE CHEST 1 VIEW COMPARISON:  09/07/2018 FINDINGS: Chronic interstitial lung markings are noted bilaterally. There is no focal infiltrate. The heart size is normal. There is no pneumothorax. No large pleural effusion. There is an old left clavicle fracture. Atherosclerotic changes are noted of the thoracic aorta. IMPRESSION: No active disease. Electronically Signed   By: Constance Holster M.D.   On: 05/29/2020 15:43    Assessment/Plan: 85yo with gross hematuria, improving  The patients hematuria is likely related to UTI and has improved with antibiotic therapy. He is currently able to urinate without difficulty after foley removal. The patient should followup with [facility] 2-3 weeks after discharge   LOS: 2 days   Nicolette Bang 05/31/2020, 11:47 AM

## 2020-05-31 NOTE — TOC Initial Note (Signed)
Transition of Care [facility]) - Initial/Assessment Note    Patient Details  Name: [name] [name] MRN: [id number] Date of Birth: [birth year]  Transition of [facility]) CM/SW Contact:    Natasha Bence, LCSW Phone Number: 05/31/2020, 11:11 AM  Clinical Narrative:                 Patient is an 86 year old male admitted for chronic kidney disease. Patient's family is agreeable to SNF and HH if referred. Patient' son reported that the patient has expressed resistance to utilizing SNF placement but has a Hx of SNF placement with the [facility]. TOC to follow.    Expected Discharge Plan: Home/Self Care        Expected Discharge Plan and Services Expected Discharge Plan: Home/Self Care       Living arrangements for the past 2 months: Single Family Home                      Prior Living Arrangements/Services Living arrangements for the past 2 months: Single Family Home Lives with:: Self Patient language and need for interpreter reviewed:: Yes Do you feel safe going back to the place where you live?: Yes      Need for Family Participation in Patient Care: Yes (Comment) Care giver support system in place?: Yes (comment)   Criminal Activity/Legal Involvement Pertinent to Current Situation/Hospitalization: No - Comment as needed  Activities of Daily Living Home Assistive Devices/Equipment: Cane (specify quad or straight), Eyeglasses, Dentures (specify type), Shower chair with back, Wheelchair ADL Screening (condition at time of admission) Patient's cognitive ability adequate to safely complete daily activities?: Yes Is the patient deaf or have difficulty hearing?: No Does the patient have difficulty seeing, even when wearing glasses/contacts?: No Does the patient have difficulty concentrating, remembering, or making decisions?: No Patient able to express need for assistance with ADLs?: Yes Does the patient have difficulty dressing or bathing?: Yes Independently performs ADLs?:  No Communication: Independent Dressing (OT): Needs assistance Is this a change from baseline?: Pre-admission baseline Grooming: Needs assistance Is this a change from baseline?: Pre-admission baseline Feeding: Independent Bathing: Needs assistance Is this a change from baseline?: Pre-admission baseline Toileting: Needs assistance Is this a change from baseline?: Pre-admission baseline In/Out Bed: Independent Walks in Home: Independent with device (comment) Does the patient have difficulty walking or climbing stairs?: Yes Weakness of Legs: Both Weakness of Arms/Hands: Both  Permission Sought/Granted Permission sought to share information with : Chartered certified accountant granted to share information with : Yes, Verbal Permission Granted  Share Information with NAME: Leemon Ayala  Permission granted to share info w AGENCY: Local HH and SNF placement  Permission granted to share info w Relationship: son  Permission granted to share info w Contact Information: Local SNF and HH placement agencies  Emotional Assessment       Orientation: : Oriented to Self, Oriented to Place, Oriented to  Time, Oriented to Situation Alcohol / Substance Use: Not Applicable Psych Involvement: No (comment)  Admission diagnosis:  Pyelonephritis [N12] Hydronephrosis with infection [N13.6] Sepsis, due to unspecified organism, unspecified whether acute organ dysfunction present [facility]) [A41.9] Patient Active Problem List   Diagnosis Date Noted  . Hydronephrosis with infection 05/29/2020  . Hyperglycemia 05/29/2020  . Syncope 09/07/2018  . Acute lower UTI 09/07/2018  . ARF (acute renal failure) ([place]) 09/07/2018  . Gross hematuria 07/25/2018  . Sepsis due to urinary tract infection ([place]) 07/19/2018  . Paroxysmal A-fib ([place]) 07/19/2018  .  Chronic kidney disease, stage IV (severe) ([place]) 07/19/2018  . Hyperlipidemia 08/05/2014  . Knee effusion, right 04/21/2012  . Essential hypertension  07/25/2011  . Anemia 08/27/2010  . CAD (coronary artery disease) 06/14/2010  . CLOSED FRACTURE OF ACROMIAL END OF CLAVICLE 04/25/2010   PCP:  [name], MD Pharmacy:   [facility], [place] [address] Phone: [phone] Fax: [phone]    Readmission Risk Interventions No flowsheet data found.

## 2020-05-31 NOTE — Care Management Important Message (Signed)
Important Message  Patient Details  Name: [name] [name] MRN: [id number] Date of Birth: Jan 16, 1934   Medicare Important Message Given:  Yes     Tommy Medal 05/31/2020, 3:15 PM

## 2020-05-31 NOTE — Progress Notes (Signed)
Pharmacy Antibiotic Note  [name] [name] is a 86 y.o. male admitted on 05/29/2020 with UTI.  Pharmacy has been consulted for Cefepime dosing.  Plan: Cefepime 2000 mg IV every 24 hours. Monitor labs, c/s, and patient improvement.  Height: 5\' 8"  (172.7 cm) Weight: 81.6 kg (180 lb) IBW/kg (Calculated) : 68.4  Temp (24hrs), Avg:100.4 F (38 C), Min:98 F (36.7 C), Max:103.7 F (39.8 C)  Recent Labs  Lab 05/28/20 1709 05/29/20 1524 05/29/20 2156 05/30/20 0509  WBC 6.0 17.0*  --  21.4*  CREATININE 3.73* 3.81*  --  3.85*  LATICACIDVEN  --   --  0.9  --     Estimated Creatinine Clearance: 14.1 mL/min (A) (by C-G formula based on SCr of 3.85 mg/dL (H)).    Allergies  Allergen Reactions  . Ivp Dye [Iodinated Diagnostic Agents]     Due to partial renal failure  . Nalbuphine Other (See Comments)    PATIENT STATES IT MADE HIM FEEL LIKE HE WAS GOING TO DIE. NO SPECIFICS  . Codeine Anxiety    Antimicrobials this admission: Cefepime 6/30 >>  CTX 6/28 >> 6/30  Dose adjustments this admission: Cefepime  Microbiology results: 6/28 BCx: ngtd 6/27 UCx: pseudomonas    Thank you for allowing pharmacy to be a part of this patient's care.  Ramond Craver 05/31/2020 10:38 AM

## 2020-05-31 NOTE — Progress Notes (Signed)
CRITICAL VALUE ALERT  Critical Value:  Aerobic bottle- blood culture- gram negative rods  Date & Time Notied:  05/31/2020 1500  Provider Notified: Dr. Roxan Hockey   Orders Received/Actions taken: Dr. Roxan Hockey consulted with pharmacist to consider what antibiotic to put patient on.

## 2020-06-01 ENCOUNTER — Ambulatory Visit: Payer: MEDICARE

## 2020-06-01 DIAGNOSIS — A4152 Sepsis due to Pseudomonas: Principal | ICD-10-CM

## 2020-06-01 LAB — BASIC METABOLIC PANEL
Anion gap: 11 (ref 5–15)
BUN: 55 mg/dL — ABNORMAL HIGH (ref 8–23)
CO2: 19 mmol/L — ABNORMAL LOW (ref 22–32)
Calcium: 8.5 mg/dL — ABNORMAL LOW (ref 8.9–10.3)
Chloride: 112 mmol/L — ABNORMAL HIGH (ref 98–111)
Creatinine, Ser: 3.32 mg/dL — ABNORMAL HIGH (ref 0.61–1.24)
GFR calc Af Amer: 19 mL/min — ABNORMAL LOW (ref >60)
GFR calc non Af Amer: 16 mL/min — ABNORMAL LOW (ref >60)
Glucose, Bld: 107 mg/dL — ABNORMAL HIGH (ref 70–99)
Potassium: 3.7 mmol/L (ref 3.5–5.1)
Sodium: 142 mmol/L (ref 135–145)

## 2020-06-01 LAB — CBC
HCT: 26.4 % — ABNORMAL LOW (ref 39.0–52.0)
Hemoglobin: 8.2 g/dL — ABNORMAL LOW (ref 13.0–17.0)
MCH: 31.4 pg (ref 26.0–34.0)
MCHC: 31.1 g/dL (ref 30.0–36.0)
MCV: 101.1 fL — ABNORMAL HIGH (ref 80.0–100.0)
Platelets: 106 10*3/uL — ABNORMAL LOW (ref 150–400)
RBC: 2.61 MIL/uL — ABNORMAL LOW (ref 4.22–5.81)
RDW: 14.7 % (ref 11.5–15.5)
WBC: 9.2 10*3/uL (ref 4.0–10.5)
nRBC: 0 % (ref 0.0–0.2)

## 2020-06-01 NOTE — Progress Notes (Signed)
PROGRESS NOTE    [name] [name]  OEV:[id number] DOB: [birth year] DOA: 05/29/2020 PCP: [name], MD   Chief Complaint  Patient presents with  . Urinary Retention    Brief Narrative:  As per H&P written by Dr. [name] on 05/29/2020 86 y.o. male with medical history significant of DVT, prostate cancer, CAD, Nephrolithiasis, CKD/ESRD, HTN, HLD. Pt presenting w/ 2 day h/o hematuria. Passing red urine and clots. Presented ot [facility] 1 day ago and was dx w/ hydronephrosis and hematuria. Foley catheter was placed and pt placed on Keflex though definitive UTI was not appreciated. Pt states that since that time he has had increasing penile and groin pain. Pt was evaluated by home aid who told him he was gaining water weight and should get checked out. Pt then came to the Ed. Pt endorses sx as above and fatigue but denies LE swelling, SOB, DOE, CP, palpitations, fevers, myalgias.   Assessment & Plan:  1) sepsis secondary to Pseudomonas UTI--urine culture this admission from 05/28/2020 with MDR Pseudomonas Aeruginosa (resistant to imipenem, Cipro, intermediate to [place], sensitive to gentamicin)  -Patient met sepsis criteria this admission with leukocytosis, metabolic encephalopathy/altered mentation, tachycardia and tachypnea --positive urine and blood cultures -Discussed with pharmacist, will request further antibiotic sensitivities to be run by lab/micro department -Stop Rocephin,  -STop cefepime  --Fevers resolving Started on Recarbrio 05/31/20  -Antimicrobials this admission: Cefepime 6/30 >>  CTX 6/28 >> 6/30  Recarbrio 05/31/20  Microbiology results: 6/28 BCx: ngtd 6/27 UCx: pseudomonas  7/1/ Blood Cx Pseudomonas  2)Gross Hematuria--hematuria resolving after removal of Foley catheter on 05/30/2020 -Urology input appreciated -Nocturia may have been secondary to UTI  3)-CAD (coronary artery disease) -No chest pain, no shortness of breath -Continue Crestor 10 mg daily,, Coreg 6.25 mg  twice daily,  4-Essential hypertension -BP not quite at goal, continue amlodipine 10 mg daily, continue Coreg 6.25 mg twice daily, stop Lasix  5--Chronic kidney disease, stage IV (severe) (HCC) -continue sodium bicarbonate 650 mg 3 times daily -not requiring emergent HD currently -Serum creatinine less than 4, around patient's baseline  6)-chronic paroxysm atrial fibrillation -Currently sinus rhythm Continue Coreg 6.25 mg twice daily for rate control. -Not on chronic anticoagulation--- given hematuria will not initiate anticoagulation  7-GERD -continue Protonix  8-Depression/Anxiety -Stable mood, no hallucinations, no suicidal ideation. -Continue Abilify 20 mg daily, Zoloft 100 mg daily and Xanax as needed and antidepressant regimen.  9) chronic anemia---suspect underlying anemia of CKD, Hgb stable around 9 -May need ESA/EPO agent as outpatient  10)Social/Ethics-- Discussed with son ( Pt is a DNR/DNI, but Full scope of Treatment  DVT prophylaxis: SCDs. Code Status: Full code. Family Communication: Caregiver at bedside. Disposition:   Status is: Inpatient  Dispo: The patient is from: Home with caregiver.              Anticipated d/c is to: Home with caregiver.              Anticipated d/c date is: 06/01/20              Patient currently not medically stable; still spiking high-grade temperature and complaining of suprapubic tenderness.  Some improvement no hematuria appreciated.  No nausea or vomiting currently.       Consultants:   Urology service.   Procedures:  See below for x-ray reports.   Antimicrobials:  Rocephin   Subjective: Overall fever trending down -Hematuria improving -Lethargy improving  Objective: Vitals:   05/31/20 1941 05/31/20 2113 06/01/20 0522  06/01/20 1300  BP:  (!) 123/41 (!) 149/54 139/62  Pulse:  75 84 79  Resp:    20  Temp:  98.1 F (36.7 C) 98 F (36.7 C) 98.4 F (36.9 C)  TempSrc:  Oral Oral Oral  SpO2: 94% 96% 95% 97%    Weight:      Height:        Intake/Output Summary (Last 24 hours) at 06/01/2020 1347 Last data filed at 06/01/2020 1300 Gross per 24 hour  Intake 1102.57 ml  Output --  Net 1102.57 ml   Filed Weights   05/29/20 1231  Weight: 81.6 kg    Examination: General exam: Somewhat sleepy, in no acute distress Respiratory system: Clear to auscultation. Respiratory effort normal. Cardiovascular system: Rate controlled, no JVD, no murmurs, no gallops, no rubs. Gastrointestinal system: Abdomen is nondistended, soft and with positive tenderness to palpation in suprapubic area.  Foley catheter in place.  . Normal bowel sounds heard. Central nervous system: Alert and oriented. No focal neurological deficits. Extremities: No cyanosis or clubbing.  SCDs in place. Skin: No petechiae. Psychiatry: Judgement and insight appear normal. Mood & affect appropriate. GU--Foley is out, no significant hematuria at this time with voiding    Data Reviewed:   CBC: Recent Labs  Lab 05/28/20 1709 05/29/20 1524 05/30/20 0509 06/01/20 0529  WBC 6.0 17.0* 21.4* 9.2  NEUTROABS 4.4 15.2*  --   --   HGB 8.8* 9.4* 9.1* 8.2*  HCT 26.2* 28.3* 28.2* 26.4*  MCV 96.3 95.9 100.4* 101.1*  PLT 138* 165 144* 106*    Basic Metabolic Panel: Recent Labs  Lab 05/28/20 1709 05/29/20 1524 05/30/20 0509 06/01/20 0529  NA 131* 132* 136 142  K 4.5 5.1 4.4 3.7  CL 96* 100 103 112*  CO2 21* 20* 21* 19*  GLUCOSE 111* 126* 114* 107*  BUN 54* 54* 54* 55*  CREATININE 3.73* 3.81* 3.85* 3.32*  CALCIUM 8.5* 9.0 8.7* 8.5*  MG  --   --  1.8  --   PHOS  --   --  4.0  --     GFR: Estimated Creatinine Clearance: 16.3 mL/min (A) (by C-G formula based on SCr of 3.32 mg/dL (H)).  Liver Function Tests: Recent Labs  Lab 05/29/20 1524 05/30/20 0509  AST 32 18  ALT 14 10  ALKPHOS 73 57  BILITOT 1.1 0.4  PROT 8.1 7.5  ALBUMIN 4.1 3.6    CBG: No results for input(s): GLUCAP in the last 168 hours.   Recent Results  (from the past 240 hour(s))  Urine culture     Status: Abnormal   Collection Time: 05/28/20  4:44 PM   Specimen: Urine, Catheterized  Result Value Ref Range Status   Specimen Description   Final    URINE, CATHETERIZED Performed at [facility], [address], [place], [place] [address]    Special Requests   Final    NONE Performed at [facility], [address] [place], [place] [address]    Culture 20,000 COLONIES/mL PSEUDOMONAS AERUGINOSA (A)  Final   Report Status 05/31/2020 FINAL  Final   Organism ID, Bacteria PSEUDOMONAS AERUGINOSA (A)  Final      Susceptibility   Pseudomonas aeruginosa - MIC*    CEFTAZIDIME 16 INTERMEDIATE Intermediate     CIPROFLOXACIN >=4 RESISTANT Resistant     GENTAMICIN 4 SENSITIVE Sensitive     IMIPENEM >=16 RESISTANT Resistant     * 20,000 COLONIES/mL PSEUDOMONAS AERUGINOSA  SARS Coronavirus 2 by RT PCR (  hospital order, performed in [facility] hospital lab) Nasopharyngeal Nasopharyngeal Swab     Status: None   Collection Time: 05/29/20  9:36 PM   Specimen: Nasopharyngeal Swab  Result Value Ref Range Status   SARS Coronavirus 2 NEGATIVE NEGATIVE Final    Comment: (NOTE) SARS-CoV-2 target nucleic acids are NOT DETECTED.  The SARS-CoV-2 RNA is generally detectable in upper and lower respiratory specimens during the acute phase of infection. The lowest concentration of SARS-CoV-2 viral copies this assay can detect is 250 copies / mL. A negative result does not preclude SARS-CoV-2 infection and should not be used as the sole basis for treatment or other patient management decisions.  A negative result may occur with improper specimen collection / handling, submission of specimen other than nasopharyngeal swab, presence of viral mutation(s) within the areas targeted by this assay, and inadequate number of viral copies (<250 copies / mL). A negative result must be combined with clinical observations, patient history, and epidemiological  information.  Fact Sheet for Patients:   StrictlyIdeas.no  Fact Sheet for Healthcare Providers: BankingDealers.co.za  This test is not yet approved or  cleared by the [place] FDA and has been authorized for detection and/or diagnosis of SARS-CoV-2 by FDA under an Emergency Use Authorization (EUA).  This EUA will remain in effect (meaning this test can be used) for the duration of the COVID-19 declaration under Section 564(b)(1) of the Act, 21 U.S.C. section 360bbb-3(b)(1), unless the authorization is terminated or revoked sooner.  Performed at [facility], [address], [place], [place] [address]   Blood culture (routine x 2)     Status: None (Preliminary result)   Collection Time: 05/29/20  9:38 PM   Specimen: BLOOD RIGHT ARM  Result Value Ref Range Status   Specimen Description BLOOD RIGHT ARM  Final   Special Requests   Final    BOTTLES DRAWN AEROBIC AND ANAEROBIC Blood Culture adequate volume   Culture  Setup Time   Final    AEROBIC BOTTLE ONLY GRAM NEGATIVE RODS Gram Stain Report Called to,Read Back By and Verified With: C THOMAS,RN'[email or web address]'  05/31/20 MKELLY    Culture   Final    NO GROWTH 3 DAYS Performed at [facility], [address], [place], [place] [address]    Report Status PENDING  Incomplete  Blood culture (routine x 2)     Status: Abnormal (Preliminary result)   Collection Time: 05/29/20  9:49 PM   Specimen: BLOOD RIGHT HAND  Result Value Ref Range Status   Specimen Description   Final    BLOOD RIGHT HAND Performed at [facility], [address], [place], [place] [address]    Special Requests   Final    BOTTLES DRAWN AEROBIC AND ANAEROBIC Blood Culture adequate volume Performed at [facility], [address] [place], [place] [address]    Culture  Setup Time   Final    GRAM NEGATIVE RODS AEROBIC BOTTLE Gram Stain Report Called to,Read Back By and Verified With: MARTIN,D'[email or web address]'  BY MATTHEWS,B  6.30.2021 [facility] CRITICAL RESULT CALLED TO, READ BACK BY AND VERIFIED WITH: T HAIRSTON RN 05/31/20 2200 JDW    Culture (A)  Final    PSEUDOMONAS AERUGINOSA SUSCEPTIBILITIES TO FOLLOW Performed at [facility], [address], [place], [place] [address]    Report Status PENDING  Incomplete  Blood Culture ID Panel (Reflexed)     Status: Abnormal   Collection Time: 05/29/20  9:49 PM  Result Value Ref Range Status  Enterococcus species NOT DETECTED NOT DETECTED Final   Listeria monocytogenes NOT DETECTED NOT DETECTED Final   Staphylococcus species NOT DETECTED NOT DETECTED Final   Staphylococcus aureus (BCID) NOT DETECTED NOT DETECTED Final   Streptococcus species NOT DETECTED NOT DETECTED Final   Streptococcus agalactiae NOT DETECTED NOT DETECTED Final   Streptococcus pneumoniae NOT DETECTED NOT DETECTED Final   Streptococcus pyogenes NOT DETECTED NOT DETECTED Final   Acinetobacter baumannii NOT DETECTED NOT DETECTED Final   Enterobacteriaceae species NOT DETECTED NOT DETECTED Final   Enterobacter cloacae complex NOT DETECTED NOT DETECTED Final   Escherichia coli NOT DETECTED NOT DETECTED Final   Klebsiella oxytoca NOT DETECTED NOT DETECTED Final   Klebsiella pneumoniae NOT DETECTED NOT DETECTED Final   Proteus species NOT DETECTED NOT DETECTED Final   Serratia marcescens NOT DETECTED NOT DETECTED Final   Carbapenem resistance NOT DETECTED NOT DETECTED Final   Haemophilus influenzae NOT DETECTED NOT DETECTED Final   Neisseria meningitidis NOT DETECTED NOT DETECTED Final   Pseudomonas aeruginosa DETECTED (A) NOT DETECTED Final    Comment: CRITICAL RESULT CALLED TO, READ BACK BY AND VERIFIED WITH: T HAIRSTON RN 05/31/20 2200 JDW    Candida albicans NOT DETECTED NOT DETECTED Final   Candida glabrata NOT DETECTED NOT DETECTED Final   Candida krusei NOT DETECTED NOT DETECTED Final   Candida parapsilosis NOT DETECTED NOT DETECTED Final   Candida tropicalis NOT DETECTED  NOT DETECTED Final    Comment: Performed at [facility], [address] [place], [place] [address]     Radiology Studies: No results found.  Scheduled Meds: . allopurinol  50 mg Oral Daily  . amLODipine  10 mg Oral Daily  . ARIPiprazole  20 mg Oral Daily  . carvedilol  6.25 mg Oral BID  . Chlorhexidine Gluconate Cloth  6 each Topical Daily  . gabapentin  100 mg Oral QHS  . pantoprazole  40 mg Oral Daily  . pneumococcal 23 valent vaccine  0.5 mL Intramuscular Tomorrow-1000  . rosuvastatin  10 mg Oral Daily  . sertraline  100 mg Oral Daily  . sodium bicarbonate  650 mg Oral TID with meals   Continuous Infusions: . sodium chloride 1,000 mL (06/01/20 0530)  . imipenem/cilastatin/relebactam (RECARBRIO) IV 0.5 g (06/01/20 0532)     LOS: 3 days    Roxan Hockey, MD [facility]  06/01/2020, 1:47 PM

## 2020-06-01 NOTE — Progress Notes (Signed)
CRITICAL VALUE ALERT  Critical Value:  1 out of 4 bottles positive for Pseudomonas Aerouginosa  Date & Time Notied:  06/01/2020   Provider Notified: M. Sharlet Salina  Orders Received/Actions taken: awaiting orders.

## 2020-06-02 ENCOUNTER — Telehealth: Payer: Self-pay | Admitting: *Deleted

## 2020-06-02 NOTE — Telephone Encounter (Signed)
Post ED Visit - Positive Culture Follow-up  Culture report reviewed by antimicrobial stewardship pharmacist: [place] Team []  Elenor Quinones, Pharm.D. []  Heide Guile, Pharm.D., BCPS AQ-ID []  Parks Neptune, Pharm.D., BCPS []  Alycia Rossetti, Pharm.D., BCPS []  [place], Pharm.D., BCPS, AAHIVP []  Legrand Como, Pharm.D., BCPS, AAHIVP []  Salome Arnt, PharmD, BCPS []  Johnnette Gourd, PharmD, BCPS []  Hughes Better, PharmD, BCPS []  Leeroy Cha, PharmD []  Laqueta Linden, PharmD, BCPS []  Albertina Parr, PharmD  [place] Team []  Leodis Sias, PharmD []  Lindell Spar, PharmD []  Royetta Asal, PharmD []  Graylin Shiver, Rph []  Rema Fendt) Glennon Mac, PharmD []  Arlyn Dunning, PharmD []  Netta Cedars, PharmD []  Dia Sitter, PharmD []  Leone Haven, PharmD []  Gretta Arab, PharmD []  Theodis Shove, PharmD []  Peggyann Juba, PharmD []  Reuel Boom, PharmD   Positive urine culture, reviewed by Arturo Morton, Pharm D Admitted to [facility], on appropriate therapy and no further patient follow-up is required at this time.  Harlon Flor [facility] 06/02/2020, 12:27 PM

## 2020-06-02 NOTE — Progress Notes (Signed)
PROGRESS NOTE    [name] [name]  YSH:[id number] DOB: [birth year] DOA: 05/29/2020 PCP: [name], MD   Chief Complaint  Patient presents with  . Urinary Retention    Brief Narrative:  As per H&P written by Dr. [name] on 05/29/2020 86 y.o. male with medical history significant of DVT, prostate cancer, CAD, Nephrolithiasis, CKD/ESRD, HTN, HLD. Pt presenting w/ 2 day h/o hematuria. Passing red urine and clots. Presented ot [facility] 1 day ago and was dx w/ hydronephrosis and hematuria. Foley catheter was placed and pt placed on Keflex though definitive UTI was not appreciated. Pt states that since that time he has had increasing penile and groin pain. Pt was evaluated by home aid who told him he was gaining water weight and should get checked out. Pt then came to the Ed. Pt endorses sx as above and fatigue but denies LE swelling, SOB, DOE, CP, palpitations, fevers, myalgias.   Assessment & Plan:  1)Sepsis secondary to Pseudomonas UTI--urine culture this admission from 05/28/2020 and blood cultures from 05/29/2020 with MDR Pseudomonas Aeruginosa (resistant to imipenem, Cipro, intermediate to [employer], sensitive to gentamicin)  -Patient met sepsis criteria this admission with leukocytosis, metabolic encephalopathy/altered mentation, tachycardia and tachypnea --positive urine and blood cultures -Discussed with pharmacist and ID physician Dr. Tommy Medal --Stopped Rocephin,  -STopped cefepime  --Fevers resolving Started on Recarbrio 05/31/20 -will probably continue for 6 to 7 days due to bacteremia with MDR -Antimicrobials this admission: Cefepime 6/30 >>  CTX 6/28 >> 6/30  Recarbrio 05/31/20  Microbiology results: 6/28 BCx: ngtd 6/27 UCx: pseudomonas  05/29/20 Blood Cx Pseudomonas  2)Gross Hematuria--hematuria resolving after removal of Foley catheter on 05/30/2020 -Urology input appreciated -Nocturia may have been secondary to UTI  3)-CAD (coronary artery disease) -No chest pain, no shortness of  breath -Continue Crestor 10 mg daily,, Coreg 6.25 mg twice daily,  4-Essential hypertension -BP not quite at goal, continue amlodipine 10 mg daily, continue Coreg 6.25 mg twice daily, stop Lasix  5--Chronic kidney disease, stage IV (severe) (HCC) -continue sodium bicarbonate 650 mg 3 times daily -not requiring emergent HD currently -Serum creatinine less than 4, around patient's baseline  6)-chronic paroxysm atrial fibrillation -Currently sinus rhythm Continue Coreg 6.25 mg twice daily for rate control. -Not on chronic anticoagulation--- given hematuria will not initiate anticoagulation  7-GERD -continue Protonix  8-Depression/Anxiety -Stable mood, no hallucinations, no suicidal ideation. -Continue Abilify 20 mg daily, Zoloft 100 mg daily and Xanax as needed and antidepressant regimen.  9) chronic anemia---suspect underlying anemia of CKD, Hgb stable around 9 -May need ESA/EPO agent as outpatient  10)Social/Ethics-- Discussed with son Jaizon Deroos -[phone] and patient's primary caregiver [name] [phone]) Pt is a DNR/DNI, but Full scope of Treatment  11) generalized weakness and deconditioning--awaiting physical therapy evaluation, -May need home health PT versus SNF rehab  DVT prophylaxis: SCDs. Code Status: Full code. Family Communication: Caregiver at bedside. Disposition:   Status is: Inpatient  Dispo: The patient is from: Home with caregiver.              Anticipated d/c is to: Home with caregiver.              Anticipated d/c date is: 06/01/20              Patient currently not medically stable; still spiking high-grade temperature and complaining of suprapubic tenderness.  Some improvement no hematuria appreciated.  No nausea or vomiting currently.       Consultants:  Urology service.   Procedures:  See below for x-ray reports.   Antimicrobials:  -Antimicrobials this admission: Cefepime 6/30 >>  CTX 6/28 >> 6/30  Recarbrio  05/31/20    Subjective: -Caregiver at bedside, patient is more awake, more alert, more interactive, oral intake is not great  Objective: Vitals:   06/01/20 2038 06/01/20 2043 06/02/20 0537 06/02/20 1500  BP: (!) 128/49  (!) 109/57 122/68  Pulse: 76  82 84  Resp: _0 Temp: 98.2 F (36.8 C)  99.3 F (37.4 C) 98.6 F (37 C)  TempSrc: Oral  Oral Oral  SpO2: 98% 95% 98% 95%  Weight:      Height:        Intake/Output Summary (Last 24 hours) at 06/02/2020 1638 Last data filed at 06/02/2020 1300 Gross per 24 hour  Intake 720 ml  Output 1500 ml  Net -780 ml   Filed Weights   05/29/20 1231  Weight: 81.6 kg    Examination: General exam: Less sleepy, in no acute distress Respiratory system: Clear to auscultation. Respiratory effort normal. Cardiovascular system: Rate controlled, no JVD, no murmurs, no gallops, no rubs. Gastrointestinal system: Abdomen is nondistended, soft and with positive tenderness to palpation in suprapubic area.    . Normal bowel sounds heard. Central nervous system: Alert and oriented.  Generalized weakness, but no focal neurological deficits. Extremities: No cyanosis or clubbing.  SCDs in place. Skin: No petechiae. Psychiatry: Judgement and insight appear normal. Mood & affect appropriate. GU--Foley is out, no significant hematuria at this time with voiding    Data Reviewed:   CBC: Recent Labs  Lab 05/28/20 1709 05/29/20 1524 05/30/20 0509 06/01/20 0529  WBC 6.0 17.0* 21.4* 9.2  NEUTROABS 4.4 15.2*  --   --   HGB 8.8* 9.4* 9.1* 8.2*  HCT 26.2* 28.3* 28.2* 26.4*  MCV 96.3 95.9 100.4* 101.1*  PLT 138* 165 144* 106*    Basic Metabolic Panel: Recent Labs  Lab 05/28/20 1709 05/29/20 1524 05/30/20 0509 06/01/20 0529  NA 131* 132* 136 142  K 4.5 5.1 4.4 3.7  CL 96* 100 103 112*  CO2 21* 20* 21* 19*  GLUCOSE 111* 126* 114* 107*  BUN 54* 54* 54* 55*  CREATININE 3.73* 3.81* 3.85* 3.32*  CALCIUM 8.5* 9.0 8.7* 8.5*  MG  --   --  1.8  --    PHOS  --   --  4.0  --     GFR: Estimated Creatinine Clearance: 16.3 mL/min (A) (by C-G formula based on SCr of 3.32 mg/dL (H)).  Liver Function Tests: Recent Labs  Lab 05/29/20 1524 05/30/20 0509  AST 32 18  ALT 14 10  ALKPHOS 73 57  BILITOT 1.1 0.4  PROT 8.1 7.5  ALBUMIN 4.1 3.6    CBG: No results for input(s): GLUCAP in the last 168 hours.   Recent Results (from the past 240 hour(s))  Urine culture     Status: Abnormal   Collection Time: 05/28/20  4:44 PM   Specimen: Urine, Catheterized  Result Value Ref Range Status   Specimen Description   Final    URINE, CATHETERIZED Performed at [facility], [address] [place], [place] [address]    Special Requests   Final    NONE Performed at [facility], [address], [place], [place] [address]    Culture 20,000 COLONIES/mL PSEUDOMONAS AERUGINOSA (A)  Final   Report Status 05/31/2020 FINAL  Final   Organism ID, Bacteria PSEUDOMONAS AERUGINOSA (A)  Final      Susceptibility   Pseudomonas aeruginosa - MIC*    CEFTAZIDIME 16 INTERMEDIATE Intermediate     CIPROFLOXACIN >=4 RESISTANT Resistant     GENTAMICIN 4 SENSITIVE Sensitive     IMIPENEM >=16 RESISTANT Resistant     * 20,000 COLONIES/mL PSEUDOMONAS AERUGINOSA  SARS Coronavirus 2 by RT PCR (hospital order, performed in [facility] hospital lab) Nasopharyngeal Nasopharyngeal Swab     Status: None   Collection Time: 05/29/20  9:36 PM   Specimen: Nasopharyngeal Swab  Result Value Ref Range Status   SARS Coronavirus 2 NEGATIVE NEGATIVE Final    Comment: (NOTE) SARS-CoV-2 target nucleic acids are NOT DETECTED.  The SARS-CoV-2 RNA is generally detectable in upper and lower respiratory specimens during the acute phase of infection. The lowest concentration of SARS-CoV-2 viral copies this assay can detect is 250 copies / mL. A negative result does not preclude SARS-CoV-2 infection and should not be used as the sole basis for treatment or other patient management  decisions.  A negative result may occur with improper specimen collection / handling, submission of specimen other than nasopharyngeal swab, presence of viral mutation(s) within the areas targeted by this assay, and inadequate number of viral copies (<250 copies / mL). A negative result must be combined with clinical observations, patient history, and epidemiological information.  Fact Sheet for Patients:   StrictlyIdeas.no  Fact Sheet for Healthcare Providers: BankingDealers.co.za  This test is not yet approved or  cleared by the [place] FDA and has been authorized for detection and/or diagnosis of SARS-CoV-2 by FDA under an Emergency Use Authorization (EUA).  This EUA will remain in effect (meaning this test can be used) for the duration of the COVID-19 declaration under Section 564(b)(1) of the Act, 21 U.S.C. section 360bbb-3(b)(1), unless the authorization is terminated or revoked sooner.  Performed at [facility], [address], [place], [place] [address]   Blood culture (routine x 2)     Status: Abnormal (Preliminary result)   Collection Time: 05/29/20  9:38 PM   Specimen: BLOOD RIGHT ARM  Result Value Ref Range Status   Specimen Description   Final    BLOOD RIGHT ARM Performed at [facility], [address], [place], [place] [address]    Special Requests   Final    BOTTLES DRAWN AEROBIC AND ANAEROBIC Blood Culture adequate volume Performed at [facility], [address] [place], [place] [address]    Culture  Setup Time   Final    AEROBIC BOTTLE ONLY GRAM NEGATIVE RODS Gram Stain Report Called to,Read Back By and Verified With: C THOMAS,RN_0  05/31/20 [facility] Performed at [facility], [address] [place], [place] [address]    Culture PSEUDOMONAS AERUGINOSA (A)  Final   Report Status PENDING  Incomplete  Blood culture (routine x 2)     Status: Abnormal (Preliminary result)   Collection Time: 05/29/20  9:49 PM     Specimen: BLOOD RIGHT HAND  Result Value Ref Range Status   Specimen Description   Final    BLOOD RIGHT HAND Performed at [facility], [address], [place], [place] [address]    Special Requests   Final    BOTTLES DRAWN AEROBIC AND ANAEROBIC Blood Culture adequate volume Performed at [facility], [address], [place], [place] [address]    Culture  Setup Time   Final    GRAM NEGATIVE RODS AEROBIC BOTTLE Gram Stain Report Called to,Read Back By and Verified With: MARTIN,D_1  BY MATTHEWS,B  6.30.2021 [facility] CRITICAL RESULT CALLED TO, READ BACK BY AND VERIFIED WITH: T HAIRSTON RN 05/31/20 2200 JDW    Culture (A)  Final    PSEUDOMONAS AERUGINOSA REPEATING SUSCEPTIBILITY Performed at [facility], [place] [address], [place], [place] [address]    Report Status PENDING  Incomplete  Blood Culture ID Panel (Reflexed)     Status: Abnormal   Collection Time: 05/29/20  9:49 PM  Result Value Ref Range Status   Enterococcus species NOT DETECTED NOT DETECTED Final   Listeria monocytogenes NOT DETECTED NOT DETECTED Final   Staphylococcus species NOT DETECTED NOT DETECTED Final   Staphylococcus aureus (BCID) NOT DETECTED NOT DETECTED Final   Streptococcus species NOT DETECTED NOT DETECTED Final   Streptococcus agalactiae NOT DETECTED NOT DETECTED Final   Streptococcus pneumoniae NOT DETECTED NOT DETECTED Final   Streptococcus pyogenes NOT DETECTED NOT DETECTED Final   Acinetobacter baumannii NOT DETECTED NOT DETECTED Final   Enterobacteriaceae species NOT DETECTED NOT DETECTED Final   Enterobacter cloacae complex NOT DETECTED NOT DETECTED Final   Escherichia coli NOT DETECTED NOT DETECTED Final   Klebsiella oxytoca NOT DETECTED NOT DETECTED Final   Klebsiella pneumoniae NOT DETECTED NOT DETECTED Final   Proteus species NOT DETECTED NOT DETECTED Final   Serratia marcescens NOT DETECTED NOT DETECTED Final   Carbapenem resistance NOT DETECTED NOT DETECTED Final    Haemophilus influenzae NOT DETECTED NOT DETECTED Final   Neisseria meningitidis NOT DETECTED NOT DETECTED Final   Pseudomonas aeruginosa DETECTED (A) NOT DETECTED Final    Comment: CRITICAL RESULT CALLED TO, READ BACK BY AND VERIFIED WITH: T HAIRSTON RN 05/31/20 2200 JDW    Candida albicans NOT DETECTED NOT DETECTED Final   Candida glabrata NOT DETECTED NOT DETECTED Final   Candida krusei NOT DETECTED NOT DETECTED Final   Candida parapsilosis NOT DETECTED NOT DETECTED Final   Candida tropicalis NOT DETECTED NOT DETECTED Final    Comment: Performed at [facility], [place] [address], [place], [place] [address]     Radiology Studies: No results found.  Scheduled Meds: . allopurinol  50 mg Oral Daily  . amLODipine  10 mg Oral Daily  . ARIPiprazole  20 mg Oral Daily  . carvedilol  6.25 mg Oral BID  . Chlorhexidine Gluconate Cloth  6 each Topical Daily  . gabapentin  100 mg Oral QHS  . pantoprazole  40 mg Oral Daily  . pneumococcal 23 valent vaccine  0.5 mL Intramuscular Tomorrow-1000  . rosuvastatin  10 mg Oral Daily  . sertraline  100 mg Oral Daily  . sodium bicarbonate  650 mg Oral TID with meals   Continuous Infusions: . sodium chloride 1,000 mL (06/02/20 0937)  . imipenem/cilastatin/relebactam (RECARBRIO) IV 0.5 g (06/02/20 1147)     LOS: 4 days   Roxan Hockey, MD [facility]  06/02/2020, 4:38 PM

## 2020-06-03 LAB — CULTURE, BLOOD (ROUTINE X 2)
Special Requests: ADEQUATE
Special Requests: ADEQUATE

## 2020-06-03 LAB — BASIC METABOLIC PANEL
Anion gap: 9 (ref 5–15)
BUN: 57 mg/dL — ABNORMAL HIGH (ref 8–23)
CO2: 22 mmol/L (ref 22–32)
Calcium: 8.5 mg/dL — ABNORMAL LOW (ref 8.9–10.3)
Chloride: 112 mmol/L — ABNORMAL HIGH (ref 98–111)
Creatinine, Ser: 3.18 mg/dL — ABNORMAL HIGH (ref 0.61–1.24)
GFR calc Af Amer: 20 mL/min — ABNORMAL LOW (ref >60)
GFR calc non Af Amer: 17 mL/min — ABNORMAL LOW (ref >60)
Glucose, Bld: 99 mg/dL (ref 70–99)
Potassium: 3.8 mmol/L (ref 3.5–5.1)
Sodium: 143 mmol/L (ref 135–145)

## 2020-06-03 LAB — CBC
HCT: 25.5 % — ABNORMAL LOW (ref 39.0–52.0)
Hemoglobin: 7.9 g/dL — ABNORMAL LOW (ref 13.0–17.0)
MCH: 31.3 pg (ref 26.0–34.0)
MCHC: 31 g/dL (ref 30.0–36.0)
MCV: 101.2 fL — ABNORMAL HIGH (ref 80.0–100.0)
Platelets: 129 10*3/uL — ABNORMAL LOW (ref 150–400)
RBC: 2.52 MIL/uL — ABNORMAL LOW (ref 4.22–5.81)
RDW: 14.6 % (ref 11.5–15.5)
WBC: 6.9 10*3/uL (ref 4.0–10.5)
nRBC: 0 % (ref 0.0–0.2)

## 2020-06-03 LAB — GLUCOSE, CAPILLARY: Glucose-Capillary: 106 mg/dL — ABNORMAL HIGH (ref 70–99)

## 2020-06-03 NOTE — Progress Notes (Signed)
PROGRESS NOTE    [name] [name]  WCH:[id number] DOB: [birth year] DOA: 05/29/2020 PCP: [name], MD   Chief Complaint  Patient presents with   Urinary Retention    Brief Narrative:  As per H&P written by Dr. [name] on 05/29/2020 86 y.o. male with medical history significant of DVT, prostate cancer, CAD, Nephrolithiasis, CKD/ESRD, HTN, HLD. Pt presenting w/ 2 day h/o hematuria. Passing red urine and clots. Presented ot [facility] 1 day ago and was dx w/ hydronephrosis and hematuria. Foley catheter was placed and pt placed on Keflex though definitive UTI was not appreciated. Pt states that since that time he has had increasing penile and groin pain. Pt was evaluated by home aid who told him he was gaining water weight and should get checked out. Pt then came to the Ed. Pt endorses sx as above and fatigue but denies LE swelling, SOB, DOE, CP, palpitations, fevers, myalgias.   Assessment & Plan:  1)Sepsis secondary to Pseudomonas UTI--urine culture this admission from 05/28/2020 and blood cultures from 05/29/2020 with MDR Pseudomonas Aeruginosa (resistant to imipenem, Cipro, intermediate to [employer], sensitive to gentamicin)  -Patient met sepsis criteria this admission with leukocytosis, metabolic encephalopathy/altered mentation, tachycardia and tachypnea --positive urine and blood cultures -Discussed with pharmacist and ID physician Dr. Tommy Medal --Stopped Rocephin,  -STopped cefepime  --Fevers resolving Started on iv Recarbrio 05/31/20 -will probably continue for 6 to 7 days due to bacteremia with MDR -Antimicrobials this admission: Cefepime 6/30 >>  CTX 6/28 >> 6/30  Recarbrio 05/31/20  Microbiology results: 6/28 BCx: ngtd 6/27 UCx: pseudomonas  05/29/20 Blood Cx Pseudomonas  2)Gross Hematuria--hematuria resolving after removal of Foley catheter on 05/30/2020 -Urology input appreciated -Nocturia may have been secondary to UTI  3)-CAD (coronary artery disease) -No chest pain, no shortness  of breath -Continue Crestor 10 mg daily,, Coreg 6.25 mg twice daily,  4-Essential hypertension -BP not quite at goal, continue amlodipine 10 mg daily, continue Coreg 6.25 mg twice daily, stop Lasix  5--Chronic kidney disease, stage IV (severe) (HCC) -continue sodium bicarbonate 650 mg 3 times daily -not requiring emergent HD currently -Serum creatinine less than 4, around patient's baseline  6)-chronic paroxysm atrial fibrillation -Currently sinus rhythm Continue Coreg 6.25 mg twice daily for rate control. -Not on chronic anticoagulation--- given hematuria will not initiate anticoagulation  7-GERD -continue Protonix  8-Depression/Anxiety -Stable mood, no hallucinations, no suicidal ideation. -Continue Abilify 20 mg daily, Zoloft 100 mg daily and Xanax as needed and antidepressant regimen.  9) chronic anemia---suspect underlying anemia of CKD, Hgb stable around 9 -May need ESA/EPO agent as outpatient  10)Social/Ethics-- Discussed with son Zyrus Hetland -[phone] and patient's primary caregiver [name] [phone]) Pt is a DNR/DNI, but Full scope of Treatment  11) generalized weakness and deconditioning--awaiting physical therapy evaluation, -May need home health PT versus SNF rehab  DVT prophylaxis: SCDs. Code Status: Full code. Family Communication: Caregiver at bedside. Disposition:   Status is: Inpatient  Dispo: The patient is from: Home with caregiver.              Anticipated d/c is to: Home with caregiver.              Anticipated d/c date is: 06/01/20              Patient currently not medically stable; Started on iv Recarbrio 05/31/20 -will probably continue for 6 to 7 days due to bacteremia with MDR    Consultants:   Urology service.   Procedures:  See below for x-ray reports.   Antimicrobials:  -Antimicrobials this admission: Cefepime 6/30 >>  CTX 6/28 >> 6/30  Recarbrio 05/31/20    Subjective: Appetite remains poor, remains  weak, -Complains of nausea  Objective: Vitals:   06/02/20 1500 06/02/20 2053 06/03/20 0456 06/03/20 1556  BP: 122/68 (!) 155/53 (!) 156/55 (!) 147/57  Pulse: 84 74 69 81  Resp: _0 Temp: 98.6 F (37 C) 99.1 F (37.3 C) 97.7 F (36.5 C) 98 F (36.7 C)  TempSrc: Oral Oral Oral Oral  SpO2: 95% 98% 99% 98%  Weight:      Height:        Intake/Output Summary (Last 24 hours) at 06/03/2020 1952 Last data filed at 06/03/2020 0700 Gross per 24 hour  Intake --  Output 501 ml  Net -501 ml   Filed Weights   05/29/20 1231  Weight: 81.6 kg    Examination: General exam: Less sleepy, in no acute distress Respiratory system: Clear to auscultation. Respiratory effort normal. Cardiovascular system: Rate controlled, no JVD, no murmurs, no gallops, no rubs. Gastrointestinal system: Abdomen is nondistended, soft and with positive tenderness to palpation in suprapubic area.    . Normal bowel sounds heard. Central nervous system: Alert and oriented.  Generalized weakness, but no focal neurological deficits. Extremities: No cyanosis or clubbing.  SCDs in place. Skin: No petechiae. Psychiatry: Judgement and insight appear normal. Mood & affect appropriate. GU--Foley is out, no significant hematuria at this time with voiding    Data Reviewed:   CBC: Recent Labs  Lab 05/28/20 1709 05/29/20 1524 05/30/20 0509 06/01/20 0529 06/03/20 0543  WBC 6.0 17.0* 21.4* 9.2 6.9  NEUTROABS 4.4 15.2*  --   --   --   HGB 8.8* 9.4* 9.1* 8.2* 7.9*  HCT 26.2* 28.3* 28.2* 26.4* 25.5*  MCV 96.3 95.9 100.4* 101.1* 101.2*  PLT 138* 165 144* 106* 129*    Basic Metabolic Panel: Recent Labs  Lab 05/28/20 1709 05/29/20 1524 05/30/20 0509 06/01/20 0529 06/03/20 0543  NA 131* 132* 136 142 143  K 4.5 5.1 4.4 3.7 3.8  CL 96* 100 103 112* 112*  CO2 21* 20* 21* 19* 22  GLUCOSE 111* 126* 114* 107* 99  BUN 54* 54* 54* 55* 57*  CREATININE 3.73* 3.81* 3.85* 3.32* 3.18*  CALCIUM 8.5* 9.0 8.7* 8.5* 8.5*   MG  --   --  1.8  --   --   PHOS  --   --  4.0  --   --     GFR: Estimated Creatinine Clearance: 17 mL/min (A) (by C-G formula based on SCr of 3.18 mg/dL (H)).  Liver Function Tests: Recent Labs  Lab 05/29/20 1524 05/30/20 0509  AST 32 18  ALT 14 10  ALKPHOS 73 57  BILITOT 1.1 0.4  PROT 8.1 7.5  ALBUMIN 4.1 3.6    CBG: No results for input(s): GLUCAP in the last 168 hours.   Recent Results (from the past 240 hour(s))  Urine culture     Status: Abnormal   Collection Time: 05/28/20  4:44 PM   Specimen: Urine, Catheterized  Result Value Ref Range Status   Specimen Description   Final    URINE, CATHETERIZED Performed at [facility], [address] [place], [place] [address]    Special Requests   Final    NONE Performed at [facility], [address], [place], [place] [address]    Culture 20,000 COLONIES/mL PSEUDOMONAS AERUGINOSA (A)  Final  Report Status 05/31/2020 FINAL  Final   Organism ID, Bacteria PSEUDOMONAS AERUGINOSA (A)  Final      Susceptibility   Pseudomonas aeruginosa - MIC*    CEFTAZIDIME 16 INTERMEDIATE Intermediate     CIPROFLOXACIN >=4 RESISTANT Resistant     GENTAMICIN 4 SENSITIVE Sensitive     IMIPENEM >=16 RESISTANT Resistant     * 20,000 COLONIES/mL PSEUDOMONAS AERUGINOSA  SARS Coronavirus 2 by RT PCR (hospital order, performed in [facility] hospital lab) Nasopharyngeal Nasopharyngeal Swab     Status: None   Collection Time: 05/29/20  9:36 PM   Specimen: Nasopharyngeal Swab  Result Value Ref Range Status   SARS Coronavirus 2 NEGATIVE NEGATIVE Final    Comment: (NOTE) SARS-CoV-2 target nucleic acids are NOT DETECTED.  The SARS-CoV-2 RNA is generally detectable in upper and lower respiratory specimens during the acute phase of infection. The lowest concentration of SARS-CoV-2 viral copies this assay can detect is 250 copies / mL. A negative result does not preclude SARS-CoV-2 infection and should not be used as the sole basis for treatment  or other patient management decisions.  A negative result may occur with improper specimen collection / handling, submission of specimen other than nasopharyngeal swab, presence of viral mutation(s) within the areas targeted by this assay, and inadequate number of viral copies (<250 copies / mL). A negative result must be combined with clinical observations, patient history, and epidemiological information.  Fact Sheet for Patients:   StrictlyIdeas.no  Fact Sheet for Healthcare Providers: BankingDealers.co.za  This test is not yet approved or  cleared by the [place] FDA and has been authorized for detection and/or diagnosis of SARS-CoV-2 by FDA under an Emergency Use Authorization (EUA).  This EUA will remain in effect (meaning this test can be used) for the duration of the COVID-19 declaration under Section 564(b)(1) of the Act, 21 U.S.C. section 360bbb-3(b)(1), unless the authorization is terminated or revoked sooner.  Performed at [facility], [address] [place], [place] [address]   Blood culture (routine x 2)     Status: Abnormal   Collection Time: 05/29/20  9:38 PM   Specimen: BLOOD RIGHT ARM  Result Value Ref Range Status   Specimen Description   Final    BLOOD RIGHT ARM Performed at [facility], [address] [place], [place] [address]    Special Requests   Final    BOTTLES DRAWN AEROBIC AND ANAEROBIC Blood Culture adequate volume Performed at [facility], [address] [place], [place] [address]    Culture  Setup Time   Final    AEROBIC BOTTLE ONLY GRAM NEGATIVE RODS Gram Stain Report Called to,Read Back By and Verified With: C THOMAS,RN_[id number]  05/31/20 [facility] Performed at [facility], [address] [place], [place] [address]    Culture (A)  Final    PSEUDOMONAS AERUGINOSA SUSCEPTIBILITIES PERFORMED ON PREVIOUS CULTURE WITHIN THE LAST 5 DAYS. Performed at [facility], [place] [address],  [place], [place] [address]    Report Status 06/03/2020 FINAL  Final  Blood culture (routine x 2)     Status: Abnormal   Collection Time: 05/29/20  9:49 PM   Specimen: BLOOD RIGHT HAND  Result Value Ref Range Status   Specimen Description   Final    BLOOD RIGHT HAND Performed at [facility], [address] [place], [place] [address]    Special Requests   Final    BOTTLES DRAWN AEROBIC AND ANAEROBIC Blood Culture adequate volume Performed at [facility]  [address] [place],  [address]    Culture  Setup Time   Final    GRAM NEGATIVE RODS AEROBIC BOTTLE Gram Stain Report Called to,Read Back By and Verified With: MARTIN,D_0  BY MATTHEWS,B 6.30.2021  HOSP CRITICAL RESULT CALLED TO, READ BACK BY AND VERIFIED WITH: Eugenio Hoes RN 05/31/20 2200 JDW Performed at [facility], [place] [address] [place], [place] [address]    Culture PSEUDOMONAS AERUGINOSA (A)  Final   Report Status 06/03/2020 FINAL  Final   Organism ID, Bacteria PSEUDOMONAS AERUGINOSA  Final      Susceptibility   Pseudomonas aeruginosa - MIC*    CEFTAZIDIME 8 SENSITIVE Sensitive     CIPROFLOXACIN >=4 RESISTANT Resistant     GENTAMICIN 4 SENSITIVE Sensitive     IMIPENEM >=16 RESISTANT Resistant     * PSEUDOMONAS AERUGINOSA  Blood Culture ID Panel (Reflexed)     Status: Abnormal   Collection Time: 05/29/20  9:49 PM  Result Value Ref Range Status   Enterococcus species NOT DETECTED NOT DETECTED Final   Listeria monocytogenes NOT DETECTED NOT DETECTED Final   Staphylococcus species NOT DETECTED NOT DETECTED Final   Staphylococcus aureus (BCID) NOT DETECTED NOT DETECTED Final   Streptococcus species NOT DETECTED NOT DETECTED Final   Streptococcus agalactiae NOT DETECTED NOT DETECTED Final   Streptococcus pneumoniae NOT DETECTED NOT DETECTED Final   Streptococcus pyogenes NOT DETECTED NOT DETECTED Final   Acinetobacter baumannii NOT DETECTED NOT DETECTED Final   Enterobacteriaceae species NOT DETECTED  NOT DETECTED Final   Enterobacter cloacae complex NOT DETECTED NOT DETECTED Final   Escherichia coli NOT DETECTED NOT DETECTED Final   Klebsiella oxytoca NOT DETECTED NOT DETECTED Final   Klebsiella pneumoniae NOT DETECTED NOT DETECTED Final   Proteus species NOT DETECTED NOT DETECTED Final   Serratia marcescens NOT DETECTED NOT DETECTED Final   Carbapenem resistance NOT DETECTED NOT DETECTED Final   Haemophilus influenzae NOT DETECTED NOT DETECTED Final   Neisseria meningitidis NOT DETECTED NOT DETECTED Final   Pseudomonas aeruginosa DETECTED (A) NOT DETECTED Final    Comment: CRITICAL RESULT CALLED TO, READ BACK BY AND VERIFIED WITH: T HAIRSTON RN 05/31/20 2200 JDW    Candida albicans NOT DETECTED NOT DETECTED Final   Candida glabrata NOT DETECTED NOT DETECTED Final   Candida krusei NOT DETECTED NOT DETECTED Final   Candida parapsilosis NOT DETECTED NOT DETECTED Final   Candida tropicalis NOT DETECTED NOT DETECTED Final    Comment: Performed at [facility], [place] [address], [place], [place] [address]     Radiology Studies: No results found.  Scheduled Meds:  allopurinol  50 mg Oral Daily   amLODipine  10 mg Oral Daily   ARIPiprazole  20 mg Oral Daily   carvedilol  6.25 mg Oral BID   Chlorhexidine Gluconate Cloth  6 each Topical Daily   gabapentin  100 mg Oral QHS   pantoprazole  40 mg Oral Daily   rosuvastatin  10 mg Oral Daily   sertraline  100 mg Oral Daily   sodium bicarbonate  650 mg Oral TID with meals   Continuous Infusions:  sodium chloride 1,000 mL (06/02/20 0937)   imipenem/cilastatin/relebactam (RECARBRIO) IV 0.5 g (06/03/20 1813)     LOS: 5 days   Roxan Hockey, MD [facility]  06/03/2020, 7:52 PM

## 2020-06-04 NOTE — Progress Notes (Signed)
PROGRESS NOTE    [name] [name]  CNO:[id number] DOB: 19-Mar-1934 DOA: 05/29/2020 PCP: [name], MD   Chief Complaint  Patient presents with   Urinary Retention    Brief Narrative:  As per H&P written by Dr. [name] on 05/29/2020 86 y.o. male with medical history significant of DVT, prostate cancer, CAD, Nephrolithiasis, CKD/ESRD, HTN, HLD. Pt presenting w/ 2 day h/o hematuria. Passing red urine and clots. Presented ot [facility] 1 day ago and was dx w/ hydronephrosis and hematuria. Foley catheter was placed and pt placed on Keflex though definitive UTI was not appreciated. Pt states that since that time he has had increasing penile and groin pain. Pt was evaluated by home aid who told him he was gaining water weight and should get checked out. Pt then came to the Ed. Pt endorses sx as above and fatigue but denies LE swelling, SOB, DOE, CP, palpitations, fevers, myalgias.   Assessment & Plan:  1)Sepsis secondary to Pseudomonas UTI--urine culture this admission from 05/28/2020 and blood cultures from 05/29/2020 with MDR Pseudomonas Aeruginosa (resistant to imipenem, Cipro, intermediate to [employer], sensitive to gentamicin)  -Patient met sepsis criteria this admission with leukocytosis, metabolic encephalopathy/altered mentation, tachycardia and tachypnea --positive urine and blood cultures -Discussed with pharmacist and ID physician Dr. Tommy Medal --Stopped Rocephin,  -STopped cefepime  --Fevers resolved Started on iv Recarbrio 05/31/20 -will probably continue for 6 to 7 days due to bacteremia with MDR pseudomonas , based on sensitivity report no oral agents will adequately treat this organism -Antimicrobials this admission: Cefepime 6/30 >>  CTX 6/28 >> 6/30  Recarbrio 05/31/20  Microbiology results: 6/28 BCx: ngtd 6/27 UCx: pseudomonas  05/29/20 Blood Cx Pseudomonas  2)Gross Hematuria--hematuria resolved after removal of Foley catheter on 05/30/2020 -Urology input appreciated -Nocturia and  hematuria may have been secondary to UTI  3)-CAD (coronary artery disease) -No chest pain, no shortness of breath -Continue Crestor 10 mg daily,, Coreg 6.25 mg twice daily,  4-Essential hypertension -stable--continue amlodipine 10 mg daily, continue Coreg 6.25 mg twice daily, stop Lasix  5--Chronic kidney disease, stage IV (severe) (HCC) -continue sodium bicarbonate 650 mg 3 times daily -not requiring emergent HD currently -Serum creatinine less than 4, around patient's baseline  6)-chronic paroxysm atrial fibrillation -Currently sinus rhythm Continue Coreg 6.25 mg twice daily for rate control. -Not on chronic anticoagulation--- given hematuria will not initiate anticoagulation  7-GERD -continue Protonix  8-Depression/Anxiety -Stable mood, no hallucinations, no suicidal ideation. -Continue Abilify 20 mg daily, Zoloft 100 mg daily and Xanax as needed and antidepressant regimen.  9) chronic anemia---suspect underlying anemia of CKD, Hgb stable around 9 -May need ESA/EPO agent as outpatient  10)Social/Ethics-- Discussed with son Donelle Baba -[phone] and patient's primary caregiver [name] [phone]) Pt is a DNR/DNI, but Full scope of Treatment  11)Generalized weakness and deconditioning--physical therapy evaluation appreciated they recommend SNF rehab ,however patient is not sure he wants to go to SNF, he may consider home health PT instead   DVT prophylaxis: SCDs. Code Status: Full code. Family Communication: Caregiver at bedside. Disposition:   Status is: Inpatient  Dispo: The patient is from: Home with caregiver.              Anticipated d/c is to: Home with caregiver.              Anticipated d/c date is: 06/01/20              Patient currently not medically stable; Started on iv Recarbrio  05/31/20 -will probably continue for 6 to 7 days due to bacteremia with MDR pseudomonas , based on sensitivity report no oral agents will adequately treat this  organism -- physical therapy evaluation appreciated they recommend SNF rehab ,however patient is not sure he wants to go to SNF, he may consider home health PT instead   Consultants:   Urology service.   Procedures:  See below for x-ray reports.   Antimicrobials:  -Antimicrobials this admission: Cefepime 6/30 >>  CTX 6/28 >> 6/30  Recarbrio 05/31/20    Subjective: -No fever  Or chills   No Nausea, Vomiting or Diarrhea -Oral intake is fair   Objective: Vitals:   06/03/20 0456 06/03/20 1556 06/03/20 2051 06/04/20 0414  BP: (!) 156/55 (!) 147/57 (!) 153/59 (!) 157/59  Pulse: 69 81 84 90  Resp: '16 16 16 16  ' Temp: 97.7 F (36.5 C) 98 F (36.7 C) 98.6 F (37 C) 98.3 F (36.8 C)  TempSrc: Oral Oral Oral Oral  SpO2: 99% 98% 97% 98%  Weight:      Height:        Intake/Output Summary (Last 24 hours) at 06/04/2020 1156 Last data filed at 06/04/2020 0543 Gross per 24 hour  Intake --  Output 250 ml  Net -250 ml   Filed Weights   05/29/20 1231  Weight: 81.6 kg    Examination: General exam: Resting comfortably, in no acute distress Respiratory system: Clear to auscultation. Respiratory effort normal. Cardiovascular system: Rate controlled, no JVD, no murmurs, no gallops, no rubs. Gastrointestinal system: Abdomen is nondistended, soft and , suprapubic area tenderness is resolved , + BS Central nervous system: Alert and oriented.  Generalized weakness, but no focal neurological deficits. Extremities: No cyanosis or clubbing.  SCDs in place. Skin: No petechiae. Psychiatry: Judgement and insight appear normal. Mood & affect appropriate. GU--Foley is out, no significant hematuria at this time with voiding    Data Reviewed:   CBC: Recent Labs  Lab 05/28/20 1709 05/29/20 1524 05/30/20 0509 06/01/20 0529 06/03/20 0543  WBC 6.0 17.0* 21.4* 9.2 6.9  NEUTROABS 4.4 15.2*  --   --   --   HGB 8.8* 9.4* 9.1* 8.2* 7.9*  HCT 26.2* 28.3* 28.2* 26.4* 25.5*  MCV 96.3 95.9  100.4* 101.1* 101.2*  PLT 138* 165 144* 106* 129*    Basic Metabolic Panel: Recent Labs  Lab 05/28/20 1709 05/29/20 1524 05/30/20 0509 06/01/20 0529 06/03/20 0543  NA 131* 132* 136 142 143  K 4.5 5.1 4.4 3.7 3.8  CL 96* 100 103 112* 112*  CO2 21* 20* 21* 19* 22  GLUCOSE 111* 126* 114* 107* 99  BUN 54* 54* 54* 55* 57*  CREATININE 3.73* 3.81* 3.85* 3.32* 3.18*  CALCIUM 8.5* 9.0 8.7* 8.5* 8.5*  MG  --   --  1.8  --   --   PHOS  --   --  4.0  --   --     GFR: Estimated Creatinine Clearance: 17 mL/min (A) (by C-G formula based on SCr of 3.18 mg/dL (H)).  Liver Function Tests: Recent Labs  Lab 05/29/20 1524 05/30/20 0509  AST 32 18  ALT 14 10  ALKPHOS 73 57  BILITOT 1.1 0.4  PROT 8.1 7.5  ALBUMIN 4.1 3.6    CBG: Recent Labs  Lab 06/03/20 2152  GLUCAP 106*     Recent Results (from the past 240 hour(s))  Urine culture     Status: Abnormal   Collection Time: 05/28/20  4:44  PM   Specimen: Urine, Catheterized  Result Value Ref Range Status   Specimen Description   Final    URINE, CATHETERIZED Performed at [facility], [address] [place], [place] [address]    Special Requests   Final    NONE Performed at [facility], [address] [place], [place] [address]    Culture 20,000 COLONIES/mL PSEUDOMONAS AERUGINOSA (A)  Final   Report Status 05/31/2020 FINAL  Final   Organism ID, Bacteria PSEUDOMONAS AERUGINOSA (A)  Final      Susceptibility   Pseudomonas aeruginosa - MIC*    CEFTAZIDIME 16 INTERMEDIATE Intermediate     CIPROFLOXACIN >=4 RESISTANT Resistant     GENTAMICIN 4 SENSITIVE Sensitive     IMIPENEM >=16 RESISTANT Resistant     * 20,000 COLONIES/mL PSEUDOMONAS AERUGINOSA  SARS Coronavirus 2 by RT PCR (hospital order, performed in [facility] hospital lab) Nasopharyngeal Nasopharyngeal Swab     Status: None   Collection Time: 05/29/20  9:36 PM   Specimen: Nasopharyngeal Swab  Result Value Ref Range Status   SARS Coronavirus 2 NEGATIVE NEGATIVE  Final    Comment: (NOTE) SARS-CoV-2 target nucleic acids are NOT DETECTED.  The SARS-CoV-2 RNA is generally detectable in upper and lower respiratory specimens during the acute phase of infection. The lowest concentration of SARS-CoV-2 viral copies this assay can detect is 250 copies / mL. A negative result does not preclude SARS-CoV-2 infection and should not be used as the sole basis for treatment or other patient management decisions.  A negative result may occur with improper specimen collection / handling, submission of specimen other than nasopharyngeal swab, presence of viral mutation(s) within the areas targeted by this assay, and inadequate number of viral copies (<250 copies / mL). A negative result must be combined with clinical observations, patient history, and epidemiological information.  Fact Sheet for Patients:   StrictlyIdeas.no  Fact Sheet for Healthcare Providers: BankingDealers.co.za  This test is not yet approved or  cleared by the [place] FDA and has been authorized for detection and/or diagnosis of SARS-CoV-2 by FDA under an Emergency Use Authorization (EUA).  This EUA will remain in effect (meaning this test can be used) for the duration of the COVID-19 declaration under Section 564(b)(1) of the Act, 21 U.S.C. section 360bbb-3(b)(1), unless the authorization is terminated or revoked sooner.  Performed at [facility], [address], [place], [place] [address]   Blood culture (routine x 2)     Status: Abnormal   Collection Time: 05/29/20  9:38 PM   Specimen: BLOOD RIGHT ARM  Result Value Ref Range Status   Specimen Description   Final    BLOOD RIGHT ARM Performed at [facility], [address], [place], [place] [address]    Special Requests   Final    BOTTLES DRAWN AEROBIC AND ANAEROBIC Blood Culture adequate volume Performed at [facility], [address], [place], [place] [address]    Culture   Setup Time   Final    AEROBIC BOTTLE ONLY GRAM NEGATIVE RODS Gram Stain Report Called to,Read Back By and Verified With: C THOMAS,RN'@[email or web address]'  05/31/20 [facility] Performed at [facility], [address], [place], [place] [address]    Culture (A)  Final    PSEUDOMONAS AERUGINOSA SUSCEPTIBILITIES PERFORMED ON PREVIOUS CULTURE WITHIN THE LAST 5 DAYS. Performed at [facility], [place] [address] [place],  [address]    Report Status 06/03/2020 FINAL  Final  Blood culture (routine x 2)  Status: Abnormal   Collection Time: 05/29/20  9:49 PM   Specimen: BLOOD RIGHT HAND  Result Value Ref Range Status   Specimen Description   Final    BLOOD RIGHT HAND Performed at [facility], [address], [place], [place] [address]    Special Requests   Final    BOTTLES DRAWN AEROBIC AND ANAEROBIC Blood Culture adequate volume Performed at [facility], [address], [place], [place] [address]    Culture  Setup Time   Final    GRAM NEGATIVE RODS AEROBIC BOTTLE Gram Stain Report Called to,Read Back By and Verified With: MARTIN,D'@[id number]'  BY MATTHEWS,B 6.30.2021 [facility] CRITICAL RESULT CALLED TO, READ BACK BY AND VERIFIED WITH: Eugenio Hoes RN 05/31/20 2200 JDW Performed at [facility] [address], [place], [place] [address]    Culture PSEUDOMONAS AERUGINOSA (A)  Final   Report Status 06/03/2020 FINAL  Final   Organism ID, Bacteria PSEUDOMONAS AERUGINOSA  Final      Susceptibility   Pseudomonas aeruginosa - MIC*    CEFTAZIDIME 8 SENSITIVE Sensitive     CIPROFLOXACIN >=4 RESISTANT Resistant     GENTAMICIN 4 SENSITIVE Sensitive     IMIPENEM >=16 RESISTANT Resistant     * PSEUDOMONAS AERUGINOSA  Blood Culture ID Panel (Reflexed)     Status: Abnormal   Collection Time: 05/29/20  9:49 PM  Result Value Ref Range Status   Enterococcus species NOT DETECTED NOT DETECTED Final   Listeria monocytogenes NOT DETECTED NOT DETECTED Final   Staphylococcus species NOT DETECTED NOT DETECTED  Final   Staphylococcus aureus (BCID) NOT DETECTED NOT DETECTED Final   Streptococcus species NOT DETECTED NOT DETECTED Final   Streptococcus agalactiae NOT DETECTED NOT DETECTED Final   Streptococcus pneumoniae NOT DETECTED NOT DETECTED Final   Streptococcus pyogenes NOT DETECTED NOT DETECTED Final   Acinetobacter baumannii NOT DETECTED NOT DETECTED Final   Enterobacteriaceae species NOT DETECTED NOT DETECTED Final   Enterobacter cloacae complex NOT DETECTED NOT DETECTED Final   Escherichia coli NOT DETECTED NOT DETECTED Final   Klebsiella oxytoca NOT DETECTED NOT DETECTED Final   Klebsiella pneumoniae NOT DETECTED NOT DETECTED Final   Proteus species NOT DETECTED NOT DETECTED Final   Serratia marcescens NOT DETECTED NOT DETECTED Final   Carbapenem resistance NOT DETECTED NOT DETECTED Final   Haemophilus influenzae NOT DETECTED NOT DETECTED Final   Neisseria meningitidis NOT DETECTED NOT DETECTED Final   Pseudomonas aeruginosa DETECTED (A) NOT DETECTED Final    Comment: CRITICAL RESULT CALLED TO, READ BACK BY AND VERIFIED WITH: T HAIRSTON RN 05/31/20 2200 JDW    Candida albicans NOT DETECTED NOT DETECTED Final   Candida glabrata NOT DETECTED NOT DETECTED Final   Candida krusei NOT DETECTED NOT DETECTED Final   Candida parapsilosis NOT DETECTED NOT DETECTED Final   Candida tropicalis NOT DETECTED NOT DETECTED Final    Comment: Performed at [facility], [place] [address], [place], [place] [address]     Radiology Studies: No results found.  Scheduled Meds:  allopurinol  50 mg Oral Daily   amLODipine  10 mg Oral Daily   ARIPiprazole  20 mg Oral Daily   carvedilol  6.25 mg Oral BID   Chlorhexidine Gluconate Cloth  6 each Topical Daily   gabapentin  100 mg Oral QHS   pantoprazole  40 mg Oral Daily   rosuvastatin  10 mg Oral Daily   sertraline  100 mg Oral Daily   sodium bicarbonate  650  mg Oral TID with meals   Continuous Infusions:  sodium chloride 1,000 mL  (06/02/20 0937)   imipenem/cilastatin/relebactam (RECARBRIO) IV 0.5 g (06/04/20 1038)     LOS: 6 days   Roxan Hockey, MD [facility]  06/04/2020, 11:56 AM

## 2020-06-04 NOTE — Plan of Care (Signed)
°  Problem: Acute Rehab PT Goals(only PT should resolve) Goal: Patient Will Transfer Sit To/From Stand Outcome: Progressing Flowsheets (Taken 06/04/2020 1122) Patient will transfer sit to/from stand: with min guard assist Goal: Pt Will Transfer Bed To Chair/Chair To Bed Outcome: Progressing Flowsheets (Taken 06/04/2020 1122) Pt will Transfer Bed to Chair/Chair to Bed: min guard assist Goal: Pt Will Perform Standing Balance Or Pre-Gait Outcome: Progressing Flowsheets (Taken 06/04/2020 1122) Pt will perform standing balance or pre-gait: with min guard assist Goal: Pt Will Ambulate Outcome: Progressing Flowsheets (Taken 06/04/2020 1122) Pt will Ambulate:  25 feet  with min guard assist  with least restrictive assistive device  11:22 AM, 06/04/20 Mearl Latin PT, DPT Physical Therapist at [facility]

## 2020-06-04 NOTE — Evaluation (Signed)
Physical Therapy Evaluation Patient Details Name: [name] [name] MRN: [id number] DOB: 09/14/1934 Today's Date: 06/04/2020   History of Present Illness  [name] [name] is a 86 y.o. male with medical history significant of DVT, prostate cancer, CAD, Nephrolithiasis, CKD/ESRD, HTN, HLD. Pt presenting w/ 2 day h/o hematuria. Passing red urine and clots. Presented ot [facility] 1 day ago and was dx w/ hydronephrosis and hematuria. Foley catheter was placed and pt placed on Keflex though definitive UTI was not appreciated. Pt states that since that time he has had increasing penile and groin pain. Pt was evaluated by home aid who told him he was gaining water weight and should get checked out. Pt then came to the Ed. Pt endorses sx as above and fatigue but denies LE swelling, SOB, DOE, CP, palpitations, fevers, myalgias.    Clinical Impression  Patient limited for functional mobility as stated below secondary to BLE weakness, fatigue and poor standing balance. Patient requires min/mod assist to transfer to standing with back of legs pressing on bed secondary to impaired LE strength and balance. He is very unsteady upon standing and requires assist on gait belt, HHA and SPC for balance. Patient limited to several small, shuffled steps at bedside to ambulate to chair. He shows good sitting tolerance at end of session in chair. Patient will benefit from continued physical therapy in hospital and recommended venue below to increase strength, balance, endurance for safe ADLs and gait.     Follow Up Recommendations SNF;Supervision/Assistance - 24 hour;Supervision for mobility/OOB    Equipment Recommendations  None recommended by PT    Recommendations for Other Services       Precautions / Restrictions Precautions Precautions: Fall Restrictions Weight Bearing Restrictions: No      Mobility  Bed Mobility Overal bed mobility: Needs Assistance Bed Mobility: Supine to Sit     Supine to sit:  Supervision;HOB elevated     General bed mobility comments: slow, labored transition to seatedEOB with HOB elevated  Transfers Overall transfer level: Needs assistance Equipment used: 1 person hand held assist;Straight cane Transfers: Sit to/from [facility] Sit to Stand: Min assist;Mod assist Stand pivot transfers: Min assist;Mod assist       General transfer comment: Patient requires HHA and SPC to transfer to standing, back of legs on bed while standing for balance, unsteady upon standing, requies physcial assist to tranfer to standing and assist for balance  Ambulation/Gait Ambulation/Gait assistance: Min assist;Mod assist Gait Distance (Feet): 4 Feet Assistive device: 1 person hand held assist;Straight cane Gait Pattern/deviations: Shuffle Gait velocity: decreased   General Gait Details: slow, labored gait, unsteady, limited by fatigue  Stairs            Wheelchair Mobility    Modified Rankin (Stroke Patients Only)       Balance Overall balance assessment: Needs assistance Sitting-balance support: Feet supported Sitting balance-Leahy Scale: Fair Sitting balance - Comments: good/fair seated EOB   Standing balance support: Bilateral upper extremity supported Standing balance-Leahy Scale: Poor Standing balance comment: with SPC and assist                             Pertinent Vitals/Pain Pain Assessment: No/denies pain    Home Living Family/patient expects to be discharged to:: Private residence Living Arrangements: Alone Available Help at Discharge: Personal care attendant (4 hours per day) Type of Home: House Home Access: Stairs to enter   [employer] of Steps:  2 Home Layout: Two level;Able to live on main level with bedroom/bathroom Home Equipment: Gilford Rile - 2 wheels;Shower seat;Cane - single point      Prior Function Level of Independence: Needs assistance   Gait / Transfers Assistance Needed: patient  states limited community ambulator with [facility]  ADL's / Homemaking Assistance Needed: Independent with basic ADL, requries assist from assistant        Hand Dominance        Extremity/Trunk Assessment   Upper Extremity Assessment Upper Extremity Assessment: Generalized weakness    Lower Extremity Assessment Lower Extremity Assessment: Generalized weakness    Cervical / Trunk Assessment Cervical / Trunk Assessment: Normal  Communication   Communication: No difficulties  Cognition Arousal/Alertness: Awake/alert Behavior During Therapy: WFL for tasks assessed/performed Overall Cognitive Status: Within Functional Limits for tasks assessed                                        General Comments      Exercises     Assessment/Plan    PT Assessment Patient needs continued PT services  PT Problem List Decreased strength;Decreased activity tolerance;Decreased balance;Decreased mobility;Decreased knowledge of use of DME       PT Treatment Interventions DME instruction;Balance training;Gait training;Neuromuscular re-education;Stair training;Functional mobility training;Patient/family education;Therapeutic activities;Therapeutic exercise;Wheelchair mobility training;Manual techniques    PT Goals (Current goals can be found in the Care Plan section)  Acute Rehab PT Goals Patient Stated Goal: return home PT Goal Formulation: With patient Time For Goal Achievement: 06/18/20 Potential to Achieve Goals: Fair    Frequency Min 3X/week   Barriers to discharge        Co-evaluation               AM-PAC PT "6 Clicks" Mobility  Outcome Measure Help needed turning from your back to your side while in a flat bed without using bedrails?: None Help needed moving from lying on your back to sitting on the side of a flat bed without using bedrails?: A Little Help needed moving to and from a bed to a chair (including a wheelchair)?: A Lot Help needed standing up  from a chair using your arms (e.g., wheelchair or bedside chair)?: A Lot Help needed to walk in hospital room?: A Lot Help needed climbing 3-5 steps with a railing? : A Lot 6 Click Score: 15    End of Session Equipment Utilized During Treatment: Gait belt Activity Tolerance: Patient limited by fatigue Patient left: in chair;with chair alarm set;with call bell/phone within reach Nurse Communication: Mobility status PT Visit Diagnosis: Unsteadiness on feet (R26.81);Other abnormalities of gait and mobility (R26.89);Muscle weakness (generalized) (M62.81)    Time: 6950-7225 PT Time Calculation (min) (ACUTE ONLY): 20 min   Charges:   PT Evaluation $PT Eval Low Complexity: 1 Low PT Treatments $Therapeutic Activity: 8-22 mins        11:21 AM, 06/04/20 Mearl Latin PT, DPT Physical Therapist at [facility] 06/04/2020, 11:19 AM

## 2020-06-05 LAB — COMPREHENSIVE METABOLIC PANEL
ALT: 73 U/L — ABNORMAL HIGH (ref 0–44)
AST: 57 U/L — ABNORMAL HIGH (ref 15–41)
Albumin: 2.7 g/dL — ABNORMAL LOW (ref 3.5–5.0)
Alkaline Phosphatase: 70 U/L (ref 38–126)
Anion gap: 8 (ref 5–15)
BUN: 43 mg/dL — ABNORMAL HIGH (ref 8–23)
CO2: 22 mmol/L (ref 22–32)
Calcium: 8.6 mg/dL — ABNORMAL LOW (ref 8.9–10.3)
Chloride: 112 mmol/L — ABNORMAL HIGH (ref 98–111)
Creatinine, Ser: 2.47 mg/dL — ABNORMAL HIGH (ref 0.61–1.24)
GFR calc Af Amer: 27 mL/min — ABNORMAL LOW (ref >60)
GFR calc non Af Amer: 23 mL/min — ABNORMAL LOW (ref >60)
Glucose, Bld: 99 mg/dL (ref 70–99)
Potassium: 3.7 mmol/L (ref 3.5–5.1)
Sodium: 142 mmol/L (ref 135–145)
Total Bilirubin: 0.5 mg/dL (ref 0.3–1.2)
Total Protein: 6.2 g/dL — ABNORMAL LOW (ref 6.5–8.1)

## 2020-06-05 LAB — CBC
HCT: 24.6 % — ABNORMAL LOW (ref 39.0–52.0)
Hemoglobin: 7.8 g/dL — ABNORMAL LOW (ref 13.0–17.0)
MCH: 31.5 pg (ref 26.0–34.0)
MCHC: 31.7 g/dL (ref 30.0–36.0)
MCV: 99.2 fL (ref 80.0–100.0)
Platelets: 151 10*3/uL (ref 150–400)
RBC: 2.48 MIL/uL — ABNORMAL LOW (ref 4.22–5.81)
RDW: 14.4 % (ref 11.5–15.5)
WBC: 8.7 10*3/uL (ref 4.0–10.5)
nRBC: 0 % (ref 0.0–0.2)

## 2020-06-05 NOTE — Progress Notes (Signed)
PROGRESS NOTE    [name] [name]  VVZ:[id number] DOB: Sep 15, 1934 DOA: 05/29/2020 PCP: [name], MD   Chief Complaint  Patient presents with  . Urinary Retention    Brief Narrative:  As per H&P written by Dr. [name] on 05/29/2020 86 y.o. male with medical history significant of DVT, prostate cancer, CAD, Nephrolithiasis, CKD/ESRD, HTN, HLD. Pt presenting w/ 2 day h/o hematuria. Passing red urine and clots. Presented ot [facility] 1 day ago and was dx w/ hydronephrosis and hematuria. Foley catheter was placed and pt placed on Keflex though definitive UTI was not appreciated. Pt states that since that time he has had increasing penile and groin pain. Pt was evaluated by home aid who told him he was gaining water weight and should get checked out. Pt then came to the Ed. Pt endorses sx as above and fatigue but denies LE swelling, SOB, DOE, CP, palpitations, fevers, myalgias.   Assessment & Plan:  1)Sepsis secondary to MDR Pseudomonas UTI and bacteremia--urine culture this admission from 05/28/2020 and blood cultures from 05/29/2020 with MDR Pseudomonas Aeruginosa (resistant to imipenem, Cipro, intermediate to [employer], sensitive to gentamicin)  -Patient met sepsis criteria this admission with leukocytosis, metabolic encephalopathy/altered mentation, tachycardia and tachypnea --positive urine and blood cultures -Discussed with pharmacist and ID physician Dr. Tommy Medal --Stopped Rocephin,  -STopped cefepime  --Fevers resolved, leukocytosis has resolved (WBC is down to 8.7 from 21.4) Started on iv Recarbrio 05/31/20 -will probably continue for 6 to 7 days due to bacteremia with MDR pseudomonas , based on sensitivity report no oral agents will adequately treat this organism -Antimicrobials this admission: Cefepime 6/30 >>  CTX 6/28 >> 6/30  Recarbrio 05/31/20  Microbiology results: 6/28 BCx: ngtd 6/27 UCx: pseudomonas  05/29/20 Blood Cx Pseudomonas  2)Gross Hematuria--hematuria resolved after removal  of Foley catheter on 05/30/2020 -Urology input appreciated -Nocturia and hematuria may have been secondary to UTI  3)-CAD (coronary artery disease) -No chest pain, no shortness of breath -Continue Crestor 10 mg daily,, Coreg 6.25 mg twice daily,  4-Essential hypertension -stable--continue amlodipine 10 mg daily, continue Coreg 6.25 mg twice daily, stop Lasix  5--Chronic kidney disease, stage IV (severe) (HCC) -continue sodium bicarbonate 650 mg 3 times daily -not requiring emergent HD currently -Serum creatinine less than 4, around patient's baseline  6)-chronic paroxysmal atrial fibrillation -Currently sinus rhythm Continue Coreg 6.25 mg twice daily for rate control. -Not on chronic anticoagulation--- given hematuria will not initiate anticoagulation  7-GERD -continue Protonix  8-Depression/Anxiety -Stable mood, no hallucinations, no suicidal ideation. -Continue Abilify 20 mg daily, Zoloft 100 mg daily and Xanax as needed and antidepressant regimen.  9) chronic anemia---suspect underlying anemia of CKD, Hgb is 7.8 after hydration/hemodilution -May need ESA/EPO agent as outpatient  10)Social/Ethics-- Discussed with son [name] [name] -[phone] and patient's primary caregiver [name] [name] [phone]) Pt is a DNR/DNI, but Full scope of Treatment  11)Generalized weakness and deconditioning--physical therapy evaluation appreciated they recommend SNF rehab ,however patient is not sure he wants to go to SNF, patient is considering going home with home health PT instead of SNF   DVT prophylaxis: SCDs. Code Status: Full code. Family Communication: Caregiver at bedside. Disposition:   Status is: Inpatient  Dispo: The patient is from: Home with caregiver.              Anticipated d/c is to: Home with caregiver.              Anticipated d/c date is: 06/06/20  Patient currently not medically stable; Started on iv Recarbrio 05/31/20 -will probably continue for 6  to 7 days due to bacteremia with MDR pseudomonas , based on sensitivity report no oral agents will adequately treat this organism -- physical therapy evaluation appreciated they recommend SNF rehab ,however patient is not sure he wants to go to SNF, he may consider home health PT instead   Consultants:   Urology service.   Procedures:  See below for x-ray reports.   Antimicrobials:  -Antimicrobials this admission: Cefepime 6/30 >>  CTX 6/28 >> 6/30  Recarbrio 05/31/20    Subjective: Fatigue and weakness persist, no dysuria or hematuria, no flank pain, no nausea no vomiting no diarrhea, no fevers   Objective: Vitals:   06/03/20 2051 06/04/20 0414 06/04/20 1436 06/04/20 2200  BP: (!) 153/59 (!) 157/59 (!) 158/61 (!) 160/71  Pulse: 84 90 82 84  Resp: '16 16 18 18  ' Temp: 98.6 F (37 C) 98.3 F (36.8 C) 98.8 F (37.1 C) 99.1 F (37.3 C)  TempSrc: Oral Oral Oral Oral  SpO2: 97% 98% 98% 98%  Weight:      Height:       No intake or output data in the 24 hours ending 06/05/20 1108 Filed Weights   05/29/20 1231  Weight: 81.6 kg    Examination: General exam: Resting comfortably, in no acute distress Respiratory system: Clear to auscultation. Respiratory effort normal. Cardiovascular system: Rate controlled, no JVD, no murmurs, no gallops, no rubs. Gastrointestinal system: Abdomen is nondistended, soft and , suprapubic area tenderness is resolved , + BS Central nervous system: Alert and oriented.  Generalized weakness, but no focal neurological deficits. Extremities: No cyanosis or clubbing.  SCDs in place. Skin: No petechiae. Psychiatry: Judgement and insight appear normal. Mood & affect appropriate. GU--Foley is out, no significant hematuria at this time with voiding    Data Reviewed:   CBC: Recent Labs  Lab 05/29/20 1524 05/30/20 0509 06/01/20 0529 06/03/20 0543 06/05/20 0605  WBC 17.0* 21.4* 9.2 6.9 8.7  NEUTROABS 15.2*  --   --   --   --   HGB 9.4* 9.1*  8.2* 7.9* 7.8*  HCT 28.3* 28.2* 26.4* 25.5* 24.6*  MCV 95.9 100.4* 101.1* 101.2* 99.2  PLT 165 144* 106* 129* 902    Basic Metabolic Panel: Recent Labs  Lab 05/29/20 1524 05/30/20 0509 06/01/20 0529 06/03/20 0543 06/05/20 0605  NA 132* 136 142 143 142  K 5.1 4.4 3.7 3.8 3.7  CL 100 103 112* 112* 112*  CO2 20* 21* 19* 22 22  GLUCOSE 126* 114* 107* 99 99  BUN 54* 54* 55* 57* 43*  CREATININE 3.81* 3.85* 3.32* 3.18* 2.47*  CALCIUM 9.0 8.7* 8.5* 8.5* 8.6*  MG  --  1.8  --   --   --   PHOS  --  4.0  --   --   --     GFR: Estimated Creatinine Clearance: 21.9 mL/min (A) (by C-G formula based on SCr of 2.47 mg/dL (H)).  Liver Function Tests: Recent Labs  Lab 05/29/20 1524 05/30/20 0509 06/05/20 0605  AST 32 18 57*  ALT 14 10 73*  ALKPHOS 73 57 70  BILITOT 1.1 0.4 0.5  PROT 8.1 7.5 6.2*  ALBUMIN 4.1 3.6 2.7*    CBG: Recent Labs  Lab 06/03/20 2152  GLUCAP 106*     Recent Results (from the past 240 hour(s))  Urine culture     Status: Abnormal   Collection Time: 05/28/20  4:44 PM   Specimen: Urine, Catheterized  Result Value Ref Range Status   Specimen Description   Final    URINE, CATHETERIZED Performed at [facility], [address], [place], [place] [address]    Special Requests   Final    NONE Performed at [facility], [address], [place], [place] [address]    Culture 20,000 COLONIES/mL PSEUDOMONAS AERUGINOSA (A)  Final   Report Status 05/31/2020 FINAL  Final   Organism ID, Bacteria PSEUDOMONAS AERUGINOSA (A)  Final      Susceptibility   Pseudomonas aeruginosa - MIC*    CEFTAZIDIME 16 INTERMEDIATE Intermediate     CIPROFLOXACIN >=4 RESISTANT Resistant     GENTAMICIN 4 SENSITIVE Sensitive     IMIPENEM >=16 RESISTANT Resistant     * 20,000 COLONIES/mL PSEUDOMONAS AERUGINOSA  SARS Coronavirus 2 by RT PCR (hospital order, performed in [place] hospital lab) Nasopharyngeal Nasopharyngeal Swab     Status: None   Collection Time: 05/29/20  9:36 PM    Specimen: Nasopharyngeal Swab  Result Value Ref Range Status   SARS Coronavirus 2 NEGATIVE NEGATIVE Final    Comment: (NOTE) SARS-CoV-2 target nucleic acids are NOT DETECTED.  The SARS-CoV-2 RNA is generally detectable in upper and lower respiratory specimens during the acute phase of infection. The lowest concentration of SARS-CoV-2 viral copies this assay can detect is 250 copies / mL. A negative result does not preclude SARS-CoV-2 infection and should not be used as the sole basis for treatment or other patient management decisions.  A negative result may occur with improper specimen collection / handling, submission of specimen other than nasopharyngeal swab, presence of viral mutation(s) within the areas targeted by this assay, and inadequate number of viral copies (<250 copies / mL). A negative result must be combined with clinical observations, patient history, and epidemiological information.  Fact Sheet for Patients:   StrictlyIdeas.no  Fact Sheet for Healthcare Providers: BankingDealers.co.za  This test is not yet approved or  cleared by the [place] FDA and has been authorized for detection and/or diagnosis of SARS-CoV-2 by FDA under an Emergency Use Authorization (EUA).  This EUA will remain in effect (meaning this test can be used) for the duration of the COVID-19 declaration under Section 564(b)(1) of the Act, 21 U.S.C. section 360bbb-3(b)(1), unless the authorization is terminated or revoked sooner.  Performed at [facility], [address] [place], [place] [address]   Blood culture (routine x 2)     Status: Abnormal   Collection Time: 05/29/20  9:38 PM   Specimen: BLOOD RIGHT ARM  Result Value Ref Range Status   Specimen Description   Final    BLOOD RIGHT ARM Performed at [facility], [address], [place], [place] [address]    Special Requests   Final    BOTTLES DRAWN AEROBIC AND ANAEROBIC Blood Culture  adequate volume Performed at [facility], [address], [place], [place] [address]    Culture  Setup Time   Final    AEROBIC BOTTLE ONLY GRAM NEGATIVE RODS Gram Stain Report Called to,Read Back By and Verified With: C THOMAS,RN'@[id number]'  05/31/20 [facility] Performed at [facility], [address] [place], [place] [address]    Culture (A)  Final    PSEUDOMONAS AERUGINOSA SUSCEPTIBILITIES PERFORMED ON PREVIOUS CULTURE WITHIN THE LAST 5 DAYS. Performed at [facility], [place] [address], [place], [place] [address]    Report Status 06/03/2020 FINAL  Final  Blood culture (routine x 2)  Status: Abnormal   Collection Time: 05/29/20  9:49 PM   Specimen: BLOOD RIGHT HAND  Result Value Ref Range Status   Specimen Description   Final    BLOOD RIGHT HAND Performed at [facility], [address], [place], [place] [address]    Special Requests   Final    BOTTLES DRAWN AEROBIC AND ANAEROBIC Blood Culture adequate volume Performed at [facility], [address] [place], [place] [address]    Culture  Setup Time   Final    GRAM NEGATIVE RODS AEROBIC BOTTLE Gram Stain Report Called to,Read Back By and Verified With: MARTIN,D'@[id number]'  BY MATTHEWS,B 6.30.2021 [facility] CRITICAL RESULT CALLED TO, READ BACK BY AND VERIFIED WITH: Eugenio Hoes RN 05/31/20 2200 JDW Performed at [facility] [address], [place], [place] [address]    Culture PSEUDOMONAS AERUGINOSA (A)  Final   Report Status 06/03/2020 FINAL  Final   Organism ID, Bacteria PSEUDOMONAS AERUGINOSA  Final      Susceptibility   Pseudomonas aeruginosa - MIC*    CEFTAZIDIME 8 SENSITIVE Sensitive     CIPROFLOXACIN >=4 RESISTANT Resistant     GENTAMICIN 4 SENSITIVE Sensitive     IMIPENEM >=16 RESISTANT Resistant     * PSEUDOMONAS AERUGINOSA  Blood Culture ID Panel (Reflexed)     Status: Abnormal   Collection Time: 05/29/20  9:49 PM  Result Value Ref Range Status   Enterococcus species NOT DETECTED NOT DETECTED Final    Listeria monocytogenes NOT DETECTED NOT DETECTED Final   Staphylococcus species NOT DETECTED NOT DETECTED Final   Staphylococcus aureus (BCID) NOT DETECTED NOT DETECTED Final   Streptococcus species NOT DETECTED NOT DETECTED Final   Streptococcus agalactiae NOT DETECTED NOT DETECTED Final   Streptococcus pneumoniae NOT DETECTED NOT DETECTED Final   Streptococcus pyogenes NOT DETECTED NOT DETECTED Final   Acinetobacter baumannii NOT DETECTED NOT DETECTED Final   Enterobacteriaceae species NOT DETECTED NOT DETECTED Final   Enterobacter cloacae complex NOT DETECTED NOT DETECTED Final   Escherichia coli NOT DETECTED NOT DETECTED Final   Klebsiella oxytoca NOT DETECTED NOT DETECTED Final   Klebsiella pneumoniae NOT DETECTED NOT DETECTED Final   Proteus species NOT DETECTED NOT DETECTED Final   Serratia marcescens NOT DETECTED NOT DETECTED Final   Carbapenem resistance NOT DETECTED NOT DETECTED Final   Haemophilus influenzae NOT DETECTED NOT DETECTED Final   Neisseria meningitidis NOT DETECTED NOT DETECTED Final   Pseudomonas aeruginosa DETECTED (A) NOT DETECTED Final    Comment: CRITICAL RESULT CALLED TO, READ BACK BY AND VERIFIED WITH: T HAIRSTON RN 05/31/20 2200 JDW    Candida albicans NOT DETECTED NOT DETECTED Final   Candida glabrata NOT DETECTED NOT DETECTED Final   Candida krusei NOT DETECTED NOT DETECTED Final   Candida parapsilosis NOT DETECTED NOT DETECTED Final   Candida tropicalis NOT DETECTED NOT DETECTED Final    Comment: Performed at [facility], [place] [address], [place], [place] [address]     Radiology Studies: No results found.  Scheduled Meds: . allopurinol  50 mg Oral Daily  . amLODipine  10 mg Oral Daily  . ARIPiprazole  20 mg Oral Daily  . carvedilol  6.25 mg Oral BID  . Chlorhexidine Gluconate Cloth  6 each Topical Daily  . gabapentin  100 mg Oral QHS  . pantoprazole  40 mg Oral Daily  . rosuvastatin  10 mg Oral Daily  . sertraline  100 mg Oral Daily  .  sodium bicarbonate  650 mg Oral TID with meals   Continuous Infusions: . sodium chloride 1,000 mL (06/02/20 0937)  . imipenem/cilastatin/relebactam (RECARBRIO) IV 0.5 g (06/05/20 0601)     LOS: 7 days   Roxan Hockey, MD [facility]  06/05/2020, 11:08 AM

## 2020-06-05 NOTE — Plan of Care (Signed)

## 2020-06-06 LAB — CBC
HCT: 23.1 % — ABNORMAL LOW (ref 39.0–52.0)
Hemoglobin: 7.2 g/dL — ABNORMAL LOW (ref 13.0–17.0)
MCH: 30.9 pg (ref 26.0–34.0)
MCHC: 31.2 g/dL (ref 30.0–36.0)
MCV: 99.1 fL (ref 80.0–100.0)
Platelets: 142 10*3/uL — ABNORMAL LOW (ref 150–400)
RBC: 2.33 MIL/uL — ABNORMAL LOW (ref 4.22–5.81)
RDW: 14.4 % (ref 11.5–15.5)
WBC: 7.3 10*3/uL (ref 4.0–10.5)
nRBC: 0 % (ref 0.0–0.2)

## 2020-06-06 LAB — BASIC METABOLIC PANEL
Anion gap: 8 (ref 5–15)
BUN: 40 mg/dL — ABNORMAL HIGH (ref 8–23)
CO2: 21 mmol/L — ABNORMAL LOW (ref 22–32)
Calcium: 8.2 mg/dL — ABNORMAL LOW (ref 8.9–10.3)
Chloride: 113 mmol/L — ABNORMAL HIGH (ref 98–111)
Creatinine, Ser: 2.5 mg/dL — ABNORMAL HIGH (ref 0.61–1.24)
GFR calc Af Amer: 27 mL/min — ABNORMAL LOW (ref >60)
GFR calc non Af Amer: 23 mL/min — ABNORMAL LOW (ref >60)
Glucose, Bld: 96 mg/dL (ref 70–99)
Potassium: 3.8 mmol/L (ref 3.5–5.1)
Sodium: 142 mmol/L (ref 135–145)

## 2020-06-06 MED ORDER — SERTRALINE HCL 100 MG PO TABS: 100.0000 mg | ORAL_TABLET | Freq: Every day | ORAL | 3 refills | Status: AC

## 2020-06-06 MED ORDER — HYDROCODONE-ACETAMINOPHEN 5-325 MG PO TABS: 1.0000 | ORAL_TABLET | Freq: Four times a day (QID) | ORAL | 0 refills | Status: DC | PRN

## 2020-06-06 MED ORDER — ARIPIPRAZOLE 20 MG PO TABS: 20.0000 mg | ORAL_TABLET | Freq: Every day | ORAL | 3 refills | Status: DC

## 2020-06-06 MED ORDER — GABAPENTIN 100 MG PO CAPS: 100.0000 mg | ORAL_CAPSULE | Freq: Every day | ORAL | 3 refills | Status: DC

## 2020-06-06 MED ORDER — LANSOPRAZOLE 15 MG PO CPDR: 15.0000 mg | DELAYED_RELEASE_CAPSULE | Freq: Two times a day (BID) | ORAL | 4 refills | Status: DC

## 2020-06-06 MED ORDER — ALPRAZOLAM 0.5 MG PO TABS: 0.5000 mg | ORAL_TABLET | Freq: Two times a day (BID) | ORAL | 0 refills | Status: DC | PRN

## 2020-06-06 MED ORDER — SODIUM BICARBONATE 650 MG PO TABS: 650.0000 mg | ORAL_TABLET | Freq: Three times a day (TID) | ORAL | 1 refills | Status: DC

## 2020-06-06 NOTE — Discharge Instructions (Signed)
1) you have refused to go to SNF rehab to help you get stronger and to reduce the risk of falling 2) a physical therapist will come to your house will help you with your therapy however you will need to have somebody in the house around the clock to help you to avoid falls 3) please follow-up with urologist Dr. Diona Fanti as outpatient as advised for your prostate and urinary problems

## 2020-06-06 NOTE — Plan of Care (Signed)

## 2020-06-06 NOTE — Care Management Important Message (Signed)
Important Message  Patient Details  Name: [name] [name] MRN: [id number] Date of Birth: 11-19-1934   Medicare Important Message Given:  Yes     Ihor Gully, LCSW 06/06/2020, 2:02 PM

## 2020-06-06 NOTE — Progress Notes (Signed)
Nsg Discharge Note  Admit Date:  05/29/2020 Discharge date: 06/06/2020   [name] to be D/C'd home per MD order.  AVS completed.  Copy for chart, and copy for patient signed, and dated. Patient/caregiver able to verbalize understanding.  Discharge Medication: Allergies as of 06/06/2020      Reactions   Ivp Dye [iodinated Diagnostic Agents]    Due to partial renal failure   Nalbuphine Other (See Comments)   PATIENT STATES IT MADE HIM FEEL LIKE HE WAS GOING TO DIE. NO SPECIFICS   Codeine Anxiety      Medication List    STOP taking these medications   ceFEPIme 1 g in sodium chloride 0.9 % 100 mL     TAKE these medications   acetaminophen 500 MG tablet Commonly known as: TYLENOL Take 1,000 mg by mouth every 8 (eight) hours as needed for mild pain or moderate pain. For headache pain   ALFALFA PO Take 250 mg by mouth 2 (two) times daily.   allopurinol 100 MG tablet Commonly known as: ZYLOPRIM Take 100 mg by mouth daily.   ALPRAZolam 0.5 MG tablet Commonly known as: XANAX Take 1 tablet (0.5 mg total) by mouth 2 (two) times daily as needed for anxiety. What changed:   when to take this  reasons to take this   amLODipine 10 MG tablet Commonly known as: NORVASC Take 10 mg by mouth daily.   ARIPiprazole 20 MG tablet Commonly known as: ABILIFY Take 1 tablet (20 mg total) by mouth daily.   carvedilol 3.125 MG tablet Commonly known as: COREG Take 6.25 mg by mouth 2 (two) times daily.   epoetin alfa 3000 UNIT/ML injection Commonly known as: EPOGEN Inject 3,000 Units into the vein every 14 (fourteen) days.   fish oil-omega-3 fatty acids 1000 MG capsule Take 1 g by mouth daily.   furosemide 40 MG tablet Commonly known as: LASIX Take 40 mg by mouth daily as needed.   gabapentin 100 MG capsule Commonly known as: NEURONTIN Take 1 capsule (100 mg total) by mouth at bedtime.   HYDROcodone-acetaminophen 5-325 MG tablet Commonly known as: Norco Take 1 tablet by  mouth every 6 (six) hours as needed for moderate pain.   lansoprazole 15 MG capsule Commonly known as: PREVACID Take 1 capsule (15 mg total) by mouth 2 (two) times daily before a meal.   meclizine 12.5 MG tablet Commonly known as: ANTIVERT Take 12.5 mg by mouth daily.   multivitamin with minerals Tabs tablet Take 1 tablet by mouth daily.   ofloxacin 0.3 % OTIC solution Commonly known as: FLOXIN Place 5 drops into both ears daily.   Risa-Bid Probiotic Tabs Give 1 tablet by mouth twice a day from 09/14/2018-09/24/2018   rosuvastatin 10 MG tablet Commonly known as: CRESTOR Take 10 mg by mouth daily.   sertraline 100 MG tablet Commonly known as: ZOLOFT Take 1 tablet (100 mg total) by mouth daily.   sodium bicarbonate 650 MG tablet Take 1 tablet (650 mg total) by mouth 3 (three) times daily.   traMADol 50 MG tablet Commonly known as: Ultram Take 1 tablet (50 mg total) by mouth every 6 (six) hours as needed.            Durable Medical Equipment  (From admission, onward)         Start     Ordered   06/06/20 1204  For home use only DME 3 n 1  Once       Comments: Ambulatory  dysfunction----with dyspnea on exertion   06/06/20 1204   06/06/20 1203  For home use only DME Bedside commode  Once       Comments: Ambulatory dysfunction----with dyspnea on exertion  Question:  Patient needs a bedside commode to treat with the following condition  Answer:  Ambulatory dysfunction   06/06/20 1204           Discharge Care Instructions  (From admission, onward)         Start     Ordered   06/06/20 0000  Discharge wound care:       Comments: As advised   06/06/20 1634          Discharge Assessment: Vitals:   06/05/20 2100 06/06/20 0444  BP: (!) 149/58 (!) 154/63  Pulse: 81 76  Resp: 20   Temp: 98.2 F (36.8 C) 98.5 F (36.9 C)  SpO2: 100% 99%   Skin clean, dry and intact without evidence of skin break down, no evidence of skin tears noted. IV catheter  discontinued intact. Site without signs and symptoms of complications - no redness or edema noted at insertion site, patient denies c/o pain - only slight tenderness at site.  Dressing with slight pressure applied.  D/c Instructions-Education: Discharge instructions given to patient/family with verbalized understanding. D/c education completed with patient/family including follow up instructions, medication list, d/c activities limitations if indicated, with other d/c instructions as indicated by MD - patient able to verbalize understanding, all questions fully answered. Patient instructed to return to ED, call 911, or call MD for any changes in condition.  Patient escorted via stretcher by EMS  Zachery Conch, RN 06/06/2020 7:02 PM

## 2020-06-06 NOTE — Discharge Summary (Signed)
[name] [name], is a 86 y.o. male  DOB [birth year]  MRN [id number].  Admission date:  05/29/2020  Admitting Physician  Waldemar Dickens, MD  Discharge Date:  06/06/2020   Primary MD  [name], MD  Recommendations for primary care physician for things to follow:   1)You have refused to go to SNF rehab to help you get stronger and to reduce the risk of falling 2) a physical therapist will come to your house will help you with your therapy however you will need to have somebody in the house around the clock to help you to avoid falls 3) please follow-up with urologist Dr. Diona Fanti as outpatient as advised for your prostate and urinary problems  Admission Diagnosis  Pyelonephritis [N12] Hydronephrosis with infection [N13.6] Sepsis, due to unspecified organism, unspecified whether acute organ dysfunction present [facility]) [A41.9]   Discharge Diagnosis  Pyelonephritis [N12] Hydronephrosis with infection [N13.6] Sepsis, due to unspecified organism, unspecified whether acute organ dysfunction present [facility]) [A41.9]    Principal Problem:   Sepsis due to Pseudomonas aeruginosa ([place]) Active Problems:   CAD (coronary artery disease)   Essential hypertension   Hyperlipidemia   Paroxysmal A-fib ([place])   Chronic kidney disease, stage IV (severe) ([place])   Gross hematuria   Hydronephrosis with infection   Hyperglycemia      Past Medical History:  Diagnosis Date  . Anxiety   . 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   . Cancer ([place])    prostate ca 1999/removal/ rad tx  . Carotid artery occlusion   . Depression   . DVT (deep venous thrombosis) ([place])   . Gout   . History of kidney stones   . Hyperlipidemia   . Hypertension   . Incontinence   . Reflux   . Renal disorder   . Status post implantation of artificial urinary sphincter   .  Wears dentures   . Wears glasses     Past Surgical History:  Procedure Laterality Date  . AV FISTULA PLACEMENT Left 05/15/2020   Procedure: LEFT FOREARM  ARTERIOVENOUS (AV) GRAFT INSERTION;  Surgeon: Angelia Mould, MD;  Location: [facility];  Service: Vascular;  Laterality: Left;  . CARDIAC CATHETERIZATION     2000-2001  . cataracts    . CORONARY STENT PLACEMENT    . HERNIA REPAIR    . MULTIPLE TOOTH EXTRACTIONS    . NEPHROSTOMY    . radical prostectomy    . URINARY SPHINCTER IMPLANT       HPI  from the history and physical done on the day of admission:    Chief Complaint: urinary pain, fluid retention and bloody urine  HPI: [name] [name] is a 86 y.o. male with medical history significant of DVT, prostate cancer, CAD, Nephrolithiasis, CKD/ESRD, HTN, HLD. Pt presenting w/ 2 day h/o hematuria. Passing red urine and clots. Presented ot [facility] 1 day ago and was dx w/ hydronephrosis and  hematuria. Foley catheter was placed and pt placed on Keflex though definitive UTI was not appreciated. Pt states that since that time he has had increasing penile and groin pain. Pt was evaluated by home aid who told him he was gaining water weight and should get checked out. Pt then came to the Ed. Pt endorses sx as above and fatigue but denies LE swelling, SOB, DOE, CP, palpitations, fevers, myalgias.   ED Course: Objective findings as below.   Review of Systems: As per HPI otherwise all other systems reviewed and are negative     Hospital Course:   Brief Narrative:  As per H&P written by Dr. [name] on 05/29/2020 85 y.o.malewith medical history significant ofDVT, prostate cancer, CAD, Nephrolithiasis, CKD/ESRD, HTN, HLD. Pt presenting w/ 2 day h/o hematuria. Passing red urine and clots. Presented ot [facility] ED 1 day ago and was dx w/ hydronephrosis and hematuria. Foley catheter was placed and pt placed on Keflex though definitive UTI was not appreciated. Pt states that since that time he has had  increasing penile and groin pain. Pt was evaluated by home aid who told him he was gaining water weight and should get checked out. Pt then came to the Ed. Pt endorses sx as above and fatigue but denies LE swelling, SOB, DOE, CP, palpitations, fevers, myalgias.  Assessment & Plan:  1)Sepsis secondary to MDR Pseudomonas UTI and bacteremia--urine culture this admission from 05/28/2020 and blood cultures from 05/29/2020 with MDR Pseudomonas Aeruginosa (resistant to imipenem, Cipro, intermediate to [employer], sensitive to gentamicin)  -Patient met sepsis criteria this admission with leukocytosis, metabolic encephalopathy/altered mentation, tachycardia and tachypnea --positive urine and blood cultures -Discussed with pharmacist and ID physician Dr. Tommy Medal --Stopped Rocephin,  -STopped cefepime  --Fevers resolved, leukocytosis has resolved (WBC is down to 7.3 from 21.4) Started on iv Recarbrio 05/31/20 -last dose 06/06/20 due to bacteremia with MDR pseudomonas , based on sensitivity report no oral agents will adequately treat this organism -Antimicrobials this admission: Cefepime 6/30>> CTX 6/28>>6/30  Recarbrio 05/31/20  Microbiology results: 6/28BCx: ngtd 6/27UCx: pseudomonas 05/29/20 Blood Cx Pseudomonas  2)Gross Hematuria--hematuria resolved after removal of Foley catheter on 05/30/2020 -Urology input appreciated -Nocturia and hematuria may have been secondary to UTI -Outpatient follow-up with urology advised  3)-CAD (coronary artery disease) -No chest pain, no shortness of breath -Continue Crestor 10 mg daily,, Coreg 6.25 mg twice daily,  4-Essential hypertension -stable--continue amlodipine 10 mg daily, continue Coreg 6.25 mg twice daily, stop Lasix  5--Chronic kidney disease, stage IV (severe) ([place]) -continue sodium bicarbonate 650 mg 3 times daily -not requiring emergent HD currently -Serum creatinine less than 4, around patient's baseline  6)-chronic paroxysmal atrial  fibrillation -Currently sinus rhythm Continue Coreg 6.25 mg twice daily for rate control. -Not on chronic anticoagulation--- given hematuria will not initiate anticoagulation  7-GERD -continue Protonix  8-Depression/Anxiety -Stable mood, no hallucinations, no suicidal ideation. -Continue Abilify 20 mg daily, Zoloft 100 mg daily and Xanax as needed and antidepressant regimen.  9) chronic anemia---suspect underlying anemia of CKD, Hgb is 7.8 after hydration/hemodilution -May need ESA/EPO agent as outpatient  10)Social/Ethics-- Discussed with son Tyshawn Ciullo -[phone] and patient's primary caregiver [name] [phone]) Pt is a DNR/DNI, but Full scope of Treatment  11)Generalized weakness and deconditioning--physical therapy evaluation appreciated they recommend SNF rehab ,however patient has declined SNF placement, is requesting discharge home with home health PT -He plans to get privately paid caregivers at home to do more hours with him   Code Status:  Full code. Family Communication: Caregiver at bedside and patient's son by phone Disposition:  Home with home health services    Consultants:   Urology service.   Procedures:  See below for x-ray reports.   Antimicrobials:  -Antimicrobials this admission: Cefepime 6/30>> CTX 6/28>>6/30  Recarbrio 05/31/20   Discharge Condition: Stable  Follow UP   Follow-up Information    [name], MD Follow up in 6 day(s).   Specialty: Internal Medicine Why: Monday, June 12, 2020, @ 11:30 a.m. Contact information: [place] [address] [phone]               Diet and Activity recommendation:  As advised  Discharge Instructions    Discharge Instructions    Call MD for:  difficulty breathing, headache or visual disturbances   Complete by: As directed    Call MD for:  persistant dizziness or light-headedness   Complete by: As directed    Call MD for:  persistant  nausea and vomiting   Complete by: As directed    Call MD for:  temperature >100.4   Complete by: As directed    Diet - low sodium heart healthy   Complete by: As directed    Discharge instructions   Complete by: As directed    1) you have refused to go to SNF rehab to help you get stronger and to reduce the risk of falling 2) a physical therapist will come to your house will help you with your therapy however you will need to have somebody in the house around the clock to help you to avoid falls 3) please follow-up with urologist Dr. Diona Fanti as outpatient as advised for your prostate and urinary problems   Discharge wound care:   Complete by: As directed    As advised   Increase activity slowly   Complete by: As directed         Discharge Medications     Allergies as of 06/06/2020      Reactions   Ivp Dye [iodinated Diagnostic Agents]    Due to partial renal failure   Nalbuphine Other (See Comments)   PATIENT STATES IT MADE HIM FEEL LIKE HE WAS GOING TO DIE. NO SPECIFICS   Codeine Anxiety      Medication List    STOP taking these medications   ceFEPIme 1 g in sodium chloride 0.9 % 100 mL     TAKE these medications   acetaminophen 500 MG tablet Commonly known as: TYLENOL Take 1,000 mg by mouth every 8 (eight) hours as needed for mild pain or moderate pain. For headache pain   ALFALFA PO Take 250 mg by mouth 2 (two) times daily.   allopurinol 100 MG tablet Commonly known as: ZYLOPRIM Take 100 mg by mouth daily.   ALPRAZolam 0.5 MG tablet Commonly known as: XANAX Take 1 tablet (0.5 mg total) by mouth 2 (two) times daily as needed for anxiety. What changed:   when to take this  reasons to take this   amLODipine 10 MG tablet Commonly known as: NORVASC Take 10 mg by mouth daily.   ARIPiprazole 20 MG tablet Commonly known as: ABILIFY Take 1 tablet (20 mg total) by mouth daily.   carvedilol 3.125 MG tablet Commonly known as: COREG Take 6.25 mg by mouth 2  (two) times daily.   epoetin alfa 3000 UNIT/ML injection Commonly known as: EPOGEN Inject 3,000 Units into the vein every 14 (fourteen) days.   fish oil-omega-3  fatty acids 1000 MG capsule Take 1 g by mouth daily.   furosemide 40 MG tablet Commonly known as: LASIX Take 40 mg by mouth daily as needed.   gabapentin 100 MG capsule Commonly known as: NEURONTIN Take 1 capsule (100 mg total) by mouth at bedtime.   HYDROcodone-acetaminophen 5-325 MG tablet Commonly known as: Norco Take 1 tablet by mouth every 6 (six) hours as needed for moderate pain.   lansoprazole 15 MG capsule Commonly known as: PREVACID Take 1 capsule (15 mg total) by mouth 2 (two) times daily before a meal.   meclizine 12.5 MG tablet Commonly known as: ANTIVERT Take 12.5 mg by mouth daily.   multivitamin with minerals Tabs tablet Take 1 tablet by mouth daily.   ofloxacin 0.3 % OTIC solution Commonly known as: FLOXIN Place 5 drops into both ears daily.   Risa-Bid Probiotic Tabs Give 1 tablet by mouth twice a day from 09/14/2018-09/24/2018   rosuvastatin 10 MG tablet Commonly known as: CRESTOR Take 10 mg by mouth daily.   sertraline 100 MG tablet Commonly known as: ZOLOFT Take 1 tablet (100 mg total) by mouth daily.   sodium bicarbonate 650 MG tablet Take 1 tablet (650 mg total) by mouth 3 (three) times daily.   traMADol 50 MG tablet Commonly known as: Ultram Take 1 tablet (50 mg total) by mouth every 6 (six) hours as needed.            Durable Medical Equipment  (From admission, onward)         Start     Ordered   06/06/20 1204  For home use only DME 3 n 1  Once       Comments: Ambulatory dysfunction----with dyspnea on exertion   06/06/20 1204   06/06/20 1203  For home use only DME Bedside commode  Once       Comments: Ambulatory dysfunction----with dyspnea on exertion  Question:  Patient needs a bedside commode to treat with the following condition  Answer:  Ambulatory dysfunction     06/06/20 1204           Discharge Care Instructions  (From admission, onward)         Start     Ordered   06/06/20 0000  Discharge wound care:       Comments: As advised   06/06/20 1634          Major procedures and Radiology Reports - PLEASE review detailed and final reports for all details, in brief -    CT ABDOMEN PELVIS WO CONTRAST  Result Date: 05/29/2020 CLINICAL DATA:  Bloody drainage into catheter bag no urine output EXAM: CT ABDOMEN AND PELVIS WITHOUT CONTRAST TECHNIQUE: Multidetector CT imaging of the abdomen and pelvis was performed following the standard protocol without IV contrast. COMPARISON:  CT 05/28/2020, 07/19/2018, lumbar radiograph 04/13/2020 FINDINGS: Lower chest: Lung bases demonstrate no acute consolidation or effusion. Coronary vascular calcification. Hepatobiliary: No focal liver abnormality is seen. No gallstones, gallbladder wall thickening, or biliary dilatation. Pancreas: Unremarkable. No pancreatic ductal dilatation or surrounding inflammatory changes. Spleen: Normal in size without focal abnormality. Adrenals/Urinary Tract: Adrenal glands are within normal limits. Similar mild bilateral hydronephrosis and hydroureter down to the level of the bladder without obstructing stone. Bladder nearly empty. Foley catheter in the urinary bladder. Stomach/Bowel: The stomach is nonenlarged. No dilated small bowel. No bowel wall thickening. Negative appendix. Descending and sigmoid colon diverticula without acute inflammatory change. Vascular/Lymphatic: Advanced aortic atherosclerosis without aneurysm. No suspicious nodes. Reproductive:  Prostatectomy. Other: No free air or free fluid. Fat containing left inguinal hernia. Musculoskeletal: Moderate superior endplate compression deformity at L3 IMPRESSION: 1. Similar mild bilateral hydronephrosis and hydroureter down to the level of the bladder without obstructing stone identified. Bladder is nearly empty with Foley  catheter in place. 2. Descending and sigmoid colon diverticula without acute inflammatory change. 3. Moderate superior endplate compression deformity at L3, age indeterminate. Aortic Atherosclerosis (ICD10-I70.0). Electronically Signed   By: Donavan Foil M.D.   On: 05/29/2020 19:10   DG Chest Port 1 View  Result Date: 05/29/2020 CLINICAL DATA:  Shortness of breath.  Fluid retention. EXAM: PORTABLE CHEST 1 VIEW COMPARISON:  09/07/2018 FINDINGS: Chronic interstitial lung markings are noted bilaterally. There is no focal infiltrate. The heart size is normal. There is no pneumothorax. No large pleural effusion. There is an old left clavicle fracture. Atherosclerotic changes are noted of the thoracic aorta. IMPRESSION: No active disease. Electronically Signed   By: Constance Holster M.D.   On: 05/29/2020 15:43   CT Renal Stone Study  Result Date: 05/28/2020 CLINICAL DATA:  Urinary retention. Lower abdominal pain and groin pain EXAM: CT ABDOMEN AND PELVIS WITHOUT CONTRAST TECHNIQUE: Multidetector CT imaging of the abdomen and pelvis was performed following the standard protocol without IV contrast. COMPARISON:  July 19, 2018. FINDINGS: Lower chest: The lung bases are clear. The heart is enlarged. Hepatobiliary: The liver is normal. Normal gallbladder.There is no biliary ductal dilation. Pancreas: Normal contours without ductal dilatation. No peripancreatic fluid collection. Spleen: Unremarkable. Adrenals/Urinary Tract: --Adrenal glands: Unremarkable. --Right kidney/ureter: There is mild right-sided hydroureteronephrosis to the level of the urinary bladder. There is no evidence for an obstructing stone. --Left kidney/ureter: There is mild left-sided hydroureteronephrosis to the level of the urinary bladder. There is no evidence for an obstructing stone. --Urinary bladder: The urinary bladder is decompressed with a Foley catheter. There is bladder wall thickening. Stomach/Bowel: --Stomach/Duodenum: No hiatal  hernia or other gastric abnormality. Normal duodenal course and caliber. --Small bowel: Unremarkable. --Colon: Rectosigmoid diverticulosis without acute inflammation. --Appendix: Normal. Vascular/Lymphatic: Atherosclerotic calcification is present within the non-aneurysmal abdominal aorta, without hemodynamically significant stenosis. --No retroperitoneal lymphadenopathy. --No mesenteric lymphadenopathy. --No pelvic or inguinal lymphadenopathy. Reproductive: The patient is status post prior prostatectomy. Other: No ascites or free air. There is a fat containing left inguinal hernia. Musculoskeletal. There is an age-indeterminate compression fracture of the L3 vertebral body, new since 2019. IMPRESSION: 1. Mild bilateral hydroureteronephrosis to the level of the urinary bladder. There is no evidence for an obstructing stone. 2. Rectosigmoid diverticulosis without acute inflammation. 3. Fat containing left inguinal hernia. Aortic Atherosclerosis (ICD10-I70.0). Electronically Signed   By: Constance Holster M.D.   On: 05/28/2020 18:59    Micro Results    Recent Results (from the past 240 hour(s))  Urine culture     Status: Abnormal   Collection Time: 05/28/20  4:44 PM   Specimen: Urine, Catheterized  Result Value Ref Range Status   Specimen Description   Final    URINE, CATHETERIZED Performed at [facility], [address], [place], [place] [address]    Special Requests   Final    NONE Performed at [facility], [address], [place], [place] [address]    Culture 20,000 COLONIES/mL PSEUDOMONAS AERUGINOSA (A)  Final   Report Status 05/31/2020 FINAL  Final   Organism ID, Bacteria PSEUDOMONAS AERUGINOSA (A)  Final      Susceptibility   Pseudomonas aeruginosa - MIC*    CEFTAZIDIME 16 INTERMEDIATE Intermediate  CIPROFLOXACIN >=4 RESISTANT Resistant     GENTAMICIN 4 SENSITIVE Sensitive     IMIPENEM >=16 RESISTANT Resistant     * 20,000 COLONIES/mL PSEUDOMONAS AERUGINOSA  SARS Coronavirus 2 by  RT PCR (hospital order, performed in [facility] hospital lab) Nasopharyngeal Nasopharyngeal Swab     Status: None   Collection Time: 05/29/20  9:36 PM   Specimen: Nasopharyngeal Swab  Result Value Ref Range Status   SARS Coronavirus 2 NEGATIVE NEGATIVE Final    Comment: (NOTE) SARS-CoV-2 target nucleic acids are NOT DETECTED.  The SARS-CoV-2 RNA is generally detectable in upper and lower respiratory specimens during the acute phase of infection. The lowest concentration of SARS-CoV-2 viral copies this assay can detect is 250 copies / mL. A negative result does not preclude SARS-CoV-2 infection and should not be used as the sole basis for treatment or other patient management decisions.  A negative result may occur with improper specimen collection / handling, submission of specimen other than nasopharyngeal swab, presence of viral mutation(s) within the areas targeted by this assay, and inadequate number of viral copies (<250 copies / mL). A negative result must be combined with clinical observations, patient history, and epidemiological information.  Fact Sheet for Patients:   StrictlyIdeas.no  Fact Sheet for Healthcare Providers: BankingDealers.co.za  This test is not yet approved or  cleared by the [place] FDA and has been authorized for detection and/or diagnosis of SARS-CoV-2 by FDA under an Emergency Use Authorization (EUA).  This EUA will remain in effect (meaning this test can be used) for the duration of the COVID-19 declaration under Section 564(b)(1) of the Act, 21 U.S.C. section 360bbb-3(b)(1), unless the authorization is terminated or revoked sooner.  Performed at [facility], [address] [place], [place] [address]   Blood culture (routine x 2)     Status: Abnormal   Collection Time: 05/29/20  9:38 PM   Specimen: BLOOD RIGHT ARM  Result Value Ref Range Status   Specimen Description   Final    BLOOD RIGHT  ARM Performed at [facility], [address] [place], [place] [address]    Special Requests   Final    BOTTLES DRAWN AEROBIC AND ANAEROBIC Blood Culture adequate volume Performed at [facility], [address], [place], [place] [address]    Culture  Setup Time   Final    AEROBIC BOTTLE ONLY GRAM NEGATIVE RODS Gram Stain Report Called to,Read Back By and Verified With: C THOMAS,RN_0  05/31/20 [facility] Performed at [facility], [address], [place], [place] [address]    Culture (A)  Final    PSEUDOMONAS AERUGINOSA SUSCEPTIBILITIES PERFORMED ON PREVIOUS CULTURE WITHIN THE LAST 5 DAYS. Performed at [facility], [place] [address], [place], [place] [address]    Report Status 06/03/2020 FINAL  Final  Blood culture (routine x 2)     Status: Abnormal   Collection Time: 05/29/20  9:49 PM   Specimen: BLOOD RIGHT HAND  Result Value Ref Range Status   Specimen Description   Final    BLOOD RIGHT HAND Performed at [facility], [address], [place], [place] [address]    Special Requests   Final    BOTTLES DRAWN AEROBIC AND ANAEROBIC Blood Culture adequate volume Performed at [facility], [address], [place], [place] [address]    Culture  Setup Time   Final    GRAM NEGATIVE RODS AEROBIC BOTTLE Gram Stain Report Called to,Read Back By and Verified With: MARTIN,D_1  BY MATTHEWS,B 6.30.2021  [facility] CRITICAL RESULT CALLED TO, READ BACK BY AND VERIFIED WITH: Eugenio Hoes RN 05/31/20 2200 JDW Performed at [place] [address] [place], [place] [address]    Culture PSEUDOMONAS AERUGINOSA (A)  Final   Report Status 06/03/2020 FINAL  Final   Organism ID, Bacteria PSEUDOMONAS AERUGINOSA  Final      Susceptibility   Pseudomonas aeruginosa - MIC*    CEFTAZIDIME 8 SENSITIVE Sensitive     CIPROFLOXACIN >=4 RESISTANT Resistant     GENTAMICIN 4 SENSITIVE Sensitive     IMIPENEM >=16 RESISTANT Resistant     * PSEUDOMONAS AERUGINOSA  Blood Culture ID Panel (Reflexed)      Status: Abnormal   Collection Time: 05/29/20  9:49 PM  Result Value Ref Range Status   Enterococcus species NOT DETECTED NOT DETECTED Final   Listeria monocytogenes NOT DETECTED NOT DETECTED Final   Staphylococcus species NOT DETECTED NOT DETECTED Final   Staphylococcus aureus (BCID) NOT DETECTED NOT DETECTED Final   Streptococcus species NOT DETECTED NOT DETECTED Final   Streptococcus agalactiae NOT DETECTED NOT DETECTED Final   Streptococcus pneumoniae NOT DETECTED NOT DETECTED Final   Streptococcus pyogenes NOT DETECTED NOT DETECTED Final   Acinetobacter baumannii NOT DETECTED NOT DETECTED Final   Enterobacteriaceae species NOT DETECTED NOT DETECTED Final   Enterobacter cloacae complex NOT DETECTED NOT DETECTED Final   Escherichia coli NOT DETECTED NOT DETECTED Final   Klebsiella oxytoca NOT DETECTED NOT DETECTED Final   Klebsiella pneumoniae NOT DETECTED NOT DETECTED Final   Proteus species NOT DETECTED NOT DETECTED Final   Serratia marcescens NOT DETECTED NOT DETECTED Final   Carbapenem resistance NOT DETECTED NOT DETECTED Final   Haemophilus influenzae NOT DETECTED NOT DETECTED Final   Neisseria meningitidis NOT DETECTED NOT DETECTED Final   Pseudomonas aeruginosa DETECTED (A) NOT DETECTED Final    Comment: CRITICAL RESULT CALLED TO, READ BACK BY AND VERIFIED WITH: T HAIRSTON RN 05/31/20 2200 JDW    Candida albicans NOT DETECTED NOT DETECTED Final   Candida glabrata NOT DETECTED NOT DETECTED Final   Candida krusei NOT DETECTED NOT DETECTED Final   Candida parapsilosis NOT DETECTED NOT DETECTED Final   Candida tropicalis NOT DETECTED NOT DETECTED Final    Comment: Performed at [facility], [place] [address], [place], [place] [address]       Today   Subjective    [name] [name] today has no new concerns No fever  Or chills   No Nausea, Vomiting or Diarrhea       Patient has been seen and examined prior to discharge   Objective   Blood pressure (!) 154/63,  pulse 76, temperature 98.5 F (36.9 C), temperature source Oral, resp. rate 20, height 5' 8" (1.727 m), weight 81.6 kg, SpO2 99 %.   Intake/Output Summary (Last 24 hours) at 06/06/2020 1639 Last data filed at 06/05/2020 1730 Gross per 24 hour  Intake 240 ml  Output --  Net 240 ml    Exam Gen:- Awake Alert, no acute distress  HEENT:- [place].AT, No sclera icterus Neck-Supple Neck,No JVD,.  Lungs-  CTAB , good air movement bilaterally  CV- S1, S2 normal, regular Abd-  +ve B.Sounds, Abd Soft, No tenderness,    Extremity/Skin:- No  edema,   good pulses Psych-affect is appropriate, oriented x3 Neuro-generalized weakness, no new focal deficits, no tremors    Data Review   CBC w Diff:  Lab Results  Component Value Date   WBC 7.3 06/06/2020  HGB 7.2 (L) 06/06/2020   HCT 23.1 (L) 06/06/2020   PLT 142 (L) 06/06/2020   LYMPHOPCT 5 05/29/2020   MONOPCT 5 05/29/2020   EOSPCT 0 05/29/2020   BASOPCT 0 05/29/2020    CMP:  Lab Results  Component Value Date   NA 142 06/06/2020   K 3.8 06/06/2020   CL 113 (H) 06/06/2020   CO2 21 (L) 06/06/2020   BUN 40 (H) 06/06/2020   CREATININE 2.50 (H) 06/06/2020   PROT 6.2 (L) 06/05/2020   ALBUMIN 2.7 (L) 06/05/2020   BILITOT 0.5 06/05/2020   ALKPHOS 70 06/05/2020   AST 57 (H) 06/05/2020   ALT 73 (H) 06/05/2020  .   Total Discharge time is about 33 minutes  Roxan Hockey M.D on 06/06/2020 at 4:39 PM  Go to [email or web address] -  for contact info  [facility] - Office  [phone]

## 2020-06-06 NOTE — Progress Notes (Signed)
Physical Therapy Treatment Patient Details Name: [name] [name] MRN: [id number] DOB: 1934/09/01 Today's Date: 06/06/2020    History of Present Illness [name] [name] is a 86 y.o. male with medical history significant of DVT, prostate cancer, CAD, Nephrolithiasis, CKD/ESRD, HTN, HLD. Pt presenting w/ 2 day h/o hematuria. Passing red urine and clots. Presented ot [facility] 1 day ago and was dx w/ hydronephrosis and hematuria. Foley catheter was placed and pt placed on Keflex though definitive UTI was not appreciated. Pt states that since that time he has had increasing penile and groin pain. Pt was evaluated by home aid who told him he was gaining water weight and should get checked out. Pt then came to the Ed. Pt endorses sx as above and fatigue but denies LE swelling, SOB, DOE, CP, palpitations, fevers, myalgias.    PT Comments    Patient demonstrates increased endurance/distance for ambulation in room requiring less assistance, no loss of balance, limited mostly due to c/o fatigue.  Patient's caregiver present and states she feels they will be able to safely take care of him at home.  Patient tolerated sitting up in chair with his caregiver in room after therapy - RN aware.  Patient will benefit from continued physical therapy in hospital and recommended venue below to increase strength, balance, endurance for safe ADLs and gait.   Follow Up Recommendations  SNF;Supervision/Assistance - 24 hour;Supervision for mobility/OOB     Equipment Recommendations  None recommended by PT    Recommendations for Other Services       Precautions / Restrictions Precautions Precautions: Fall Restrictions Weight Bearing Restrictions: No    Mobility  Bed Mobility Overal bed mobility: Needs Assistance Bed Mobility: Supine to Sit     Supine to sit: Min guard;Min assist     General bed mobility comments: slightly labored slow movement  Transfers Overall transfer level: Needs assistance Equipment  used: Rolling walker (2 wheeled) Transfers: Sit to/from [facility] Sit to Stand: Min guard Stand pivot transfers: Min guard       General transfer comment: slightly unsteady  Ambulation/Gait Ambulation/Gait assistance: Min guard;Min assist Gait Distance (Feet): 35 Feet Assistive device: Rolling walker (2 wheeled) Gait Pattern/deviations: Decreased step length - right;Decreased step length - left;Decreased stride length Gait velocity: decreased   General Gait Details: demonstrates increased endurance/distance for ambulation without loss of balance, limited secondary to c/o fatigue   Stairs             Wheelchair Mobility    Modified Rankin (Stroke Patients Only)       Balance Overall balance assessment: Needs assistance Sitting-balance support: Feet supported;No upper extremity supported Sitting balance-Leahy Scale: Good Sitting balance - Comments: seated at EOB   Standing balance support: During functional activity;Bilateral upper extremity supported Standing balance-Leahy Scale: Fair Standing balance comment: using RW                            Cognition Arousal/Alertness: Awake/alert Behavior During Therapy: WFL for tasks assessed/performed Overall Cognitive Status: Within Functional Limits for tasks assessed                                        Exercises      General Comments        Pertinent Vitals/Pain Pain Assessment: No/denies pain    Home Living  Prior Function            PT Goals (current goals can now be found in the care plan section) Acute Rehab PT Goals Patient Stated Goal: return home PT Goal Formulation: With patient Time For Goal Achievement: 06/18/20 Potential to Achieve Goals: Good Progress towards PT goals: Progressing toward goals    Frequency    Min 3X/week      PT Plan Current plan remains appropriate    Co-evaluation               AM-PAC PT "6 Clicks" Mobility   Outcome Measure  Help needed turning from your back to your side while in a flat bed without using bedrails?: None Help needed moving from lying on your back to sitting on the side of a flat bed without using bedrails?: A Little Help needed moving to and from a bed to a chair (including a wheelchair)?: A Little Help needed standing up from a chair using your arms (e.g., wheelchair or bedside chair)?: A Little Help needed to walk in hospital room?: A Little Help needed climbing 3-5 steps with a railing? : A Lot 6 Click Score: 18    End of Session Equipment Utilized During Treatment: Gait belt Activity Tolerance: Patient tolerated treatment well;Patient limited by fatigue Patient left: in chair;with call bell/phone within reach;with family/visitor present Nurse Communication: Mobility status PT Visit Diagnosis: Unsteadiness on feet (R26.81);Other abnormalities of gait and mobility (R26.89);Muscle weakness (generalized) (M62.81)     Time: 2025-4270 PT Time Calculation (min) (ACUTE ONLY): 25 min  Charges:  $Gait Training: 8-22 mins $Therapeutic Activity: 8-22 mins                     12:35 PM, 06/06/20 Lonell Grandchild, MPT Physical Therapist with [facility] [phone] office [phone] mobile phone

## 2020-06-08 ENCOUNTER — Ambulatory Visit: Payer: MEDICARE

## 2020-06-08 DIAGNOSIS — Z0001 Encounter for general adult medical examination with abnormal findings: Secondary | ICD-10-CM | POA: Diagnosis not present

## 2020-06-12 DIAGNOSIS — E785 Hyperlipidemia, unspecified: Secondary | ICD-10-CM | POA: Diagnosis not present

## 2020-06-12 DIAGNOSIS — G4700 Insomnia, unspecified: Secondary | ICD-10-CM | POA: Diagnosis not present

## 2020-06-12 DIAGNOSIS — K5900 Constipation, unspecified: Secondary | ICD-10-CM | POA: Diagnosis not present

## 2020-06-12 DIAGNOSIS — F331 Major depressive disorder, recurrent, moderate: Secondary | ICD-10-CM | POA: Diagnosis not present

## 2020-06-12 DIAGNOSIS — F419 Anxiety disorder, unspecified: Secondary | ICD-10-CM | POA: Diagnosis not present

## 2020-06-12 DIAGNOSIS — K219 Gastro-esophageal reflux disease without esophagitis: Secondary | ICD-10-CM | POA: Diagnosis not present

## 2020-06-12 DIAGNOSIS — M1A00X0 Idiopathic chronic gout, unspecified site, without tophus (tophi): Secondary | ICD-10-CM | POA: Diagnosis not present

## 2020-06-12 DIAGNOSIS — I25119 Atherosclerotic heart disease of native coronary artery with unspecified angina pectoris: Secondary | ICD-10-CM | POA: Diagnosis not present

## 2020-06-12 DIAGNOSIS — N185 Chronic kidney disease, stage 5: Secondary | ICD-10-CM | POA: Diagnosis not present

## 2020-06-12 DIAGNOSIS — N390 Urinary tract infection, site not specified: Secondary | ICD-10-CM | POA: Diagnosis not present

## 2020-06-12 DIAGNOSIS — Z0189 Encounter for other specified special examinations: Secondary | ICD-10-CM | POA: Diagnosis not present

## 2020-06-12 DIAGNOSIS — I10 Essential (primary) hypertension: Secondary | ICD-10-CM | POA: Diagnosis not present

## 2020-06-12 DIAGNOSIS — R42 Dizziness and giddiness: Secondary | ICD-10-CM | POA: Diagnosis not present

## 2020-06-13 ENCOUNTER — Other Ambulatory Visit: Payer: Self-pay | Admitting: *Deleted

## 2020-06-13 ENCOUNTER — Encounter: Payer: Self-pay | Admitting: *Deleted

## 2020-06-13 NOTE — Patient Outreach (Signed)
[facility]) Care Management [facility] CM Telephone Outreach, EMMI Red-Alert notification/ General Discharge PCP office completes Transition of Care follow up post-hospital discharge Post-hospital discharge day # 7  06/13/2020  RUEL DIMMICK August 29, 1934 [id number]  EMMI Red-Alert notification/ General Discharge EMMI call date/ day #: Monday 06/12/20; day # 4 Red-Alert reason(s): "sad/ hopeless/ anxious/ empty;" "lost interest;" "Other questions/ problems"  Successful telephone outreach to Julieta Gutting, 86 y/o male referred to [facility] CM this morning by [facility] CMA for [place] notification, as above.  Patient was recently hospitalized June 28-June 06, 2020 for hematuria/ UTI/ sepsis/ pyelonephritis; hydronephrosis.  He was discharged home to self-care with home health PT after refusing short-term SNF placement for rehabilitation.  Patient has history including, but not limited to, HTN/ HLD; CAD; A-Fib; CKD- IV; prostate cancer; and anxiety/ depression.  HIPAA/ identity verified with patient during phone call today; purpose of call discussed with patient along with [facility] CM services/ program.  Patient is agreeable to completing EMMI Red-Alert screening.  Patient denies clinical concerns/ issues/ questions/ problems and sounds to be in no distress throughout phone call today; reports "just weak after being in hospital," and adds that he continues to "use walker all the time."  Denies new/ recent falls.  Patient reports: -- he is not depressed/ sad/ hopeless and has not lost interest; reports he is currently on medication for depression; states that his medication is "working fine," and adds that the automated EMMI call "hung up on" him yesterday -- has private duty caregivers that take of him daily from 10 am- 8 pm; reports "they have been taking care of me for years;" confirms that private duty caregivers provide transportation to errands/ provider appointments.  Reports he is able to care  for himself during night time hours and confirms that he feels safe at home; has life-alert system -- family supportive but live out of town -- had office visit with PCP yesterday; states PCP has ordered home health PT and he is waiting to hear from home health team; states caregivers at his home have contact information for home health team -- has and takes all medications as prescribed; reports his caregiver is currently at his home now, but is unavailable to speak with me for medication review stating "she is busy right now."  Patient reports that he will ask caregiver to contact me if she has any questions/ concerns/ problems or wishes to perform a post-hospital discharge medication review.  Patient denies further issues, concerns, or problems today, and he declines participation in [place] program, stating, "I have everything I need, and my caregivers take good care of me."  Explained that he would receive a letter in the mail from me describing [facility] CM services-- encouraged he and caregiver to review letter and to contact [place] office, or myself directly should he change his mind and wish to participate.  I provided/ confirmed that patient has my direct phone number, the main [facility] CM office phone number, and the [facility] CM 24-hour nurse advice phone number should issues arise in the future.  Plan:  Will make patient inactive with [facility] CM program, as no needs were identified during EMMI screening call today, and patient has declined ongoing participation in [place] program; will make patient's PCP aware of same.  Oneta Rack, RN, BSN, [employer] [facility] Care Management  [phone]

## 2020-06-22 DIAGNOSIS — Z8546 Personal history of malignant neoplasm of prostate: Secondary | ICD-10-CM | POA: Diagnosis not present

## 2020-06-22 DIAGNOSIS — I120 Hypertensive chronic kidney disease with stage 5 chronic kidney disease or end stage renal disease: Secondary | ICD-10-CM | POA: Diagnosis not present

## 2020-06-22 DIAGNOSIS — I6523 Occlusion and stenosis of bilateral carotid arteries: Secondary | ICD-10-CM | POA: Diagnosis not present

## 2020-06-22 DIAGNOSIS — N136 Pyonephrosis: Secondary | ICD-10-CM | POA: Diagnosis not present

## 2020-06-22 DIAGNOSIS — Z86718 Personal history of other venous thrombosis and embolism: Secondary | ICD-10-CM | POA: Diagnosis not present

## 2020-06-22 DIAGNOSIS — M6289 Other specified disorders of muscle: Secondary | ICD-10-CM | POA: Diagnosis not present

## 2020-06-22 DIAGNOSIS — N186 End stage renal disease: Secondary | ICD-10-CM | POA: Diagnosis not present

## 2020-06-22 DIAGNOSIS — E785 Hyperlipidemia, unspecified: Secondary | ICD-10-CM | POA: Diagnosis not present

## 2020-06-23 ENCOUNTER — Encounter (HOSPITAL_COMMUNITY)
Admission: RE | Admit: 2020-06-23 | Discharge: 2020-06-23 | Disposition: A | Discharge status: Home / Self Care | Payer: MEDICARE | Source: Ambulatory Visit | Attending: Nephrology | Admitting: Nephrology

## 2020-06-23 ENCOUNTER — Encounter (HOSPITAL_COMMUNITY): Payer: Self-pay

## 2020-06-23 ENCOUNTER — Other Ambulatory Visit: Payer: Self-pay

## 2020-06-23 DIAGNOSIS — N189 Chronic kidney disease, unspecified: Secondary | ICD-10-CM | POA: Diagnosis not present

## 2020-06-23 DIAGNOSIS — D631 Anemia in chronic kidney disease: Secondary | ICD-10-CM | POA: Insufficient documentation

## 2020-06-23 DIAGNOSIS — N185 Chronic kidney disease, stage 5: Secondary | ICD-10-CM | POA: Diagnosis not present

## 2020-06-23 DIAGNOSIS — I129 Hypertensive chronic kidney disease with stage 1 through stage 4 chronic kidney disease, or unspecified chronic kidney disease: Secondary | ICD-10-CM | POA: Diagnosis not present

## 2020-06-23 DIAGNOSIS — E211 Secondary hyperparathyroidism, not elsewhere classified: Secondary | ICD-10-CM | POA: Diagnosis not present

## 2020-06-23 DIAGNOSIS — N184 Chronic kidney disease, stage 4 (severe): Secondary | ICD-10-CM | POA: Diagnosis not present

## 2020-06-23 DIAGNOSIS — R809 Proteinuria, unspecified: Secondary | ICD-10-CM | POA: Diagnosis not present

## 2020-06-23 HISTORY — DX: Anemia, unspecified: D64.9

## 2020-06-23 LAB — POCT HEMOGLOBIN-HEMACUE: Hemoglobin: 8.2 g/dL — ABNORMAL LOW (ref 13.0–17.0)

## 2020-06-23 MED ORDER — EPOETIN ALFA 3000 UNIT/ML IJ SOLN
INTRAMUSCULAR | Status: AC
  Filled 2020-06-23: qty 1

## 2020-06-23 MED ORDER — EPOETIN ALFA 3000 UNIT/ML IJ SOLN
3000.0000 [IU] | Freq: Once | INTRAMUSCULAR | Status: AC
  Administered 2020-06-23: 3000 [IU] via SUBCUTANEOUS

## 2020-06-28 ENCOUNTER — Other Ambulatory Visit: Payer: Self-pay

## 2020-06-28 ENCOUNTER — Ambulatory Visit (INDEPENDENT_AMBULATORY_CARE_PROVIDER_SITE_OTHER): Payer: Self-pay | Admitting: Physician Assistant

## 2020-06-28 VITALS — BP 138/61 | HR 75 | Temp 97.6°F | Resp 20 | Ht 68.0 in | Wt 180.0 lb

## 2020-06-28 DIAGNOSIS — N184 Chronic kidney disease, stage 4 (severe): Secondary | ICD-10-CM

## 2020-06-28 NOTE — Progress Notes (Signed)
POST OPERATIVE OFFICE NOTE    CC:  F/u for surgery  HPI:  This is a 86 y.o. male who is s/p Left AV forearm graft on 05/15/2020 by Dr. Scot Dock.  He denise pain and weakness in the left UE.  He is currently not on HD.  Pt returns today for follow up.    Allergies  Allergen Reactions  . Ivp Dye [Iodinated Diagnostic Agents]     Due to partial renal failure  . Nalbuphine Other (See Comments)    PATIENT STATES IT MADE HIM FEEL LIKE HE WAS GOING TO DIE. NO SPECIFICS  . Codeine Anxiety    Current Outpatient Medications  Medication Sig Dispense Refill  . acetaminophen (TYLENOL) 500 MG tablet Take 1,000 mg by mouth every 8 (eight) hours as needed for mild pain or moderate pain. For headache pain     . ALFALFA PO Take 250 mg by mouth 2 (two) times daily.     Marland Kitchen allopurinol (ZYLOPRIM) 100 MG tablet Take 100 mg by mouth daily.   0  . ALPRAZolam (XANAX) 0.5 MG tablet Take 1 tablet (0.5 mg total) by mouth 2 (two) times daily as needed for anxiety. 15 tablet 0  . amLODipine (NORVASC) 10 MG tablet Take 10 mg by mouth daily.    . ARIPiprazole (ABILIFY) 20 MG tablet Take 1 tablet (20 mg total) by mouth daily. 30 tablet 3  . carvedilol (COREG) 3.125 MG tablet Take 6.25 mg by mouth 2 (two) times daily.    Marland Kitchen epoetin alfa (EPOGEN) 3000 UNIT/ML injection Inject 3,000 Units into the vein every 14 (fourteen) days.    . fish oil-omega-3 fatty acids 1000 MG capsule Take 1 g by mouth daily.     . furosemide (LASIX) 40 MG tablet Take 40 mg by mouth daily as needed.    . gabapentin (NEURONTIN) 100 MG capsule Take 1 capsule (100 mg total) by mouth at bedtime. 30 capsule 3  . HYDROcodone-acetaminophen (NORCO) 5-325 MG tablet Take 1 tablet by mouth every 6 (six) hours as needed for moderate pain. 12 tablet 0  . lansoprazole (PREVACID) 15 MG capsule Take 1 capsule (15 mg total) by mouth 2 (two) times daily before a meal. 60 capsule 4  . meclizine (ANTIVERT) 12.5 MG tablet Take 12.5 mg by mouth daily.    .  Multiple Vitamin (MULTIVITAMIN WITH MINERALS) TABS tablet Take 1 tablet by mouth daily.    Marland Kitchen ofloxacin (FLOXIN) 0.3 % OTIC solution Place 5 drops into both ears daily.   2  . Probiotic Product (RISA-BID PROBIOTIC) TABS Give 1 tablet by mouth twice a day from 09/14/2018-09/24/2018    . rosuvastatin (CRESTOR) 10 MG tablet Take 10 mg by mouth daily.     . sertraline (ZOLOFT) 100 MG tablet Take 1 tablet (100 mg total) by mouth daily. 30 tablet 3  . sodium bicarbonate 650 MG tablet Take 1 tablet (650 mg total) by mouth 3 (three) times daily. 90 tablet 1  . traMADol (ULTRAM) 50 MG tablet Take 1 tablet (50 mg total) by mouth every 6 (six) hours as needed. 15 tablet 0   No current facility-administered medications for this visit.     ROS:  See HPI  Physical Exam:    Incision:  Well healed incision with palpable thrill in the graft Extremities:  Grip , sensation and palpable radial pulse intact left UE   Assessment/Plan:  This is a 86 y.o. male who is s/p: Left forearm AV graft placement.  The graft  may be accessed as needed at this time.  He is currently not on HD.  F/U PRN  Roxy Horseman PA-C [facility] [phone]  Clinic MD:  Scot Dock

## 2020-06-29 DIAGNOSIS — Z8546 Personal history of malignant neoplasm of prostate: Secondary | ICD-10-CM | POA: Diagnosis not present

## 2020-06-29 DIAGNOSIS — N136 Pyonephrosis: Secondary | ICD-10-CM | POA: Diagnosis not present

## 2020-06-29 DIAGNOSIS — N186 End stage renal disease: Secondary | ICD-10-CM | POA: Diagnosis not present

## 2020-06-29 DIAGNOSIS — I120 Hypertensive chronic kidney disease with stage 5 chronic kidney disease or end stage renal disease: Secondary | ICD-10-CM | POA: Diagnosis not present

## 2020-06-29 DIAGNOSIS — E785 Hyperlipidemia, unspecified: Secondary | ICD-10-CM | POA: Diagnosis not present

## 2020-06-29 DIAGNOSIS — I6523 Occlusion and stenosis of bilateral carotid arteries: Secondary | ICD-10-CM | POA: Diagnosis not present

## 2020-07-05 DIAGNOSIS — E785 Hyperlipidemia, unspecified: Secondary | ICD-10-CM | POA: Diagnosis not present

## 2020-07-05 DIAGNOSIS — N186 End stage renal disease: Secondary | ICD-10-CM | POA: Diagnosis not present

## 2020-07-05 DIAGNOSIS — N136 Pyonephrosis: Secondary | ICD-10-CM | POA: Diagnosis not present

## 2020-07-05 DIAGNOSIS — Z8546 Personal history of malignant neoplasm of prostate: Secondary | ICD-10-CM | POA: Diagnosis not present

## 2020-07-05 DIAGNOSIS — I120 Hypertensive chronic kidney disease with stage 5 chronic kidney disease or end stage renal disease: Secondary | ICD-10-CM | POA: Diagnosis not present

## 2020-07-05 DIAGNOSIS — I6523 Occlusion and stenosis of bilateral carotid arteries: Secondary | ICD-10-CM | POA: Diagnosis not present

## 2020-07-07 ENCOUNTER — Other Ambulatory Visit: Payer: Self-pay

## 2020-07-07 ENCOUNTER — Encounter (HOSPITAL_COMMUNITY): Payer: Self-pay

## 2020-07-07 ENCOUNTER — Encounter (HOSPITAL_COMMUNITY)
Admission: RE | Admit: 2020-07-07 | Discharge: 2020-07-07 | Disposition: A | Discharge status: Home / Self Care | Payer: MEDICARE | Source: Ambulatory Visit | Attending: Nephrology | Admitting: Nephrology

## 2020-07-07 DIAGNOSIS — R809 Proteinuria, unspecified: Secondary | ICD-10-CM | POA: Diagnosis not present

## 2020-07-07 DIAGNOSIS — D631 Anemia in chronic kidney disease: Secondary | ICD-10-CM | POA: Diagnosis not present

## 2020-07-07 DIAGNOSIS — R21 Rash and other nonspecific skin eruption: Secondary | ICD-10-CM | POA: Diagnosis not present

## 2020-07-07 DIAGNOSIS — N184 Chronic kidney disease, stage 4 (severe): Secondary | ICD-10-CM | POA: Diagnosis not present

## 2020-07-07 DIAGNOSIS — N189 Chronic kidney disease, unspecified: Secondary | ICD-10-CM | POA: Diagnosis not present

## 2020-07-07 DIAGNOSIS — E211 Secondary hyperparathyroidism, not elsewhere classified: Secondary | ICD-10-CM | POA: Diagnosis not present

## 2020-07-07 LAB — POCT HEMOGLOBIN-HEMACUE: Hemoglobin: 8.3 g/dL — ABNORMAL LOW (ref 13.0–17.0)

## 2020-07-07 MED ORDER — EPOETIN ALFA 3000 UNIT/ML IJ SOLN
3000.0000 [IU] | Freq: Once | INTRAMUSCULAR | Status: AC
  Administered 2020-07-07: 3000 [IU] via SUBCUTANEOUS
  Filled 2020-07-07: qty 1

## 2020-07-10 ENCOUNTER — Ambulatory Visit (INDEPENDENT_AMBULATORY_CARE_PROVIDER_SITE_OTHER): Payer: MEDICARE | Admitting: Urology

## 2020-07-10 ENCOUNTER — Encounter: Payer: Self-pay | Admitting: Urology

## 2020-07-10 ENCOUNTER — Other Ambulatory Visit: Payer: Self-pay

## 2020-07-10 VITALS — BP 120/67 | HR 76 | Temp 98.4°F | Ht 68.0 in | Wt 180.0 lb

## 2020-07-10 DIAGNOSIS — R310 Gross hematuria: Secondary | ICD-10-CM

## 2020-07-10 LAB — BLADDER SCAN AMB NON-IMAGING: Scan Result: 38.6

## 2020-07-10 NOTE — Progress Notes (Signed)
Urological Symptom Review  Patient is experiencing the following symptoms: Frequent urination Leakage of urine Blood in urine Urinary tract infection Injury to kidneys/bladder Kidney stones  Review of Systems  Gastrointestinal (upper)  : Indigestion/heartburn  Gastrointestinal (lower) : Negative for lower GI symptoms  Constitutional : Negative for symptoms  Skin: Negative for skin symptoms  Eyes: Negative for eye symptoms  Ear/Nose/Throat : Negative for Ear/Nose/Throat symptoms  Hematologic/Lymphatic: Easy bruising  Cardiovascular : Leg swelling  Respiratory : Negative for respiratory symptoms  Endocrine: Negative for endocrine symptoms  Musculoskeletal: Back pain  Neurological: Negative for neurological symptoms  Psychologic: Depression Anxiety

## 2020-07-10 NOTE — Patient Instructions (Signed)
Hematuria, Adult Hematuria is blood in the urine. Blood may be visible in the urine, or it may be identified with a test. This condition can be caused by infections of the bladder, urethra, kidney, or prostate. Other possible causes include:  Kidney stones.  Cancer of the urinary tract.  Too much calcium in the urine.  Conditions that are passed from parent to child (inherited conditions).  Exercise that requires a lot of energy. Infections can usually be treated with medicine, and a kidney stone usually will pass through your urine. If neither of these is the cause of your hematuria, more tests may be needed to identify the cause of your symptoms. It is very important to tell your health care provider about any blood in your urine, even if it is painless or the blood stops without treatment. Blood in the urine, when it happens and then stops and then happens again, can be a symptom of a very serious condition, including cancer. There is no pain in the initial stages of many urinary cancers. Follow these instructions at home: Medicines  Take over-the-counter and prescription medicines only as told by your health care provider.  If you were prescribed an antibiotic medicine, take it as told by your health care provider. Do not stop taking the antibiotic even if you start to feel better. Eating and drinking  Drink enough fluid to keep your urine clear or pale yellow. It is recommended that you drink 3-4 quarts (2.8-3.8 L) a day. If you have been diagnosed with an infection, it is recommended that you drink cranberry juice in addition to large amounts of water.  Avoid caffeine, tea, and carbonated beverages. These tend to irritate the bladder.  Avoid alcohol because it may irritate the prostate (men). General instructions  If you have been diagnosed with a kidney stone, follow your health care provider's instructions about straining your urine to catch the stone.  Empty your bladder  often. Avoid holding urine for long periods of time.  If you are male: ? After a bowel movement, wipe from front to back and use each piece of toilet paper only once. ? Empty your bladder before and after sex.  Pay attention to any changes in your symptoms. Tell your health care provider about any changes or any new symptoms.  It is your responsibility to get your test results. Ask your health care provider, or the department performing the test, when your results will be ready.  Keep all follow-up visits as told by your health care provider. This is important. Contact a health care provider if:  You develop back pain.  You have a fever.  You have nausea or vomiting.  Your symptoms do not improve after 3 days.  Your symptoms get worse. Get help right away if:  You develop severe vomiting and are unable take medicine without vomiting.  You develop severe pain in your back or abdomen even though you are taking medicine.  You pass a large amount of blood in your urine.  You pass blood clots in your urine.  You feel very weak or like you might faint.  You faint. Summary  Hematuria is blood in the urine. It has many possible causes.  It is very important that you tell your health care provider about any blood in your urine, even if it is painless or the blood stops without treatment.  Take over-the-counter and prescription medicines only as told by your health care provider.  Drink enough fluid to keep   your urine clear or pale yellow. This information is not intended to replace advice given to you by your health care provider. Make sure you discuss any questions you have with your health care provider. Document Revised: 04/14/2019 Document Reviewed: 12/21/2016 Elsevier Patient Education  2020 [employer]  

## 2020-07-10 NOTE — Progress Notes (Signed)
07/10/2020 3:36 PM   [name] [name] 07-Nov-1934 [id number]  Referring provider: [name], MD [address] [name] [name],  [place] [address]  Gross hematuria  HPI: [name] is an 86yo here for followup for gross hematuria. He was hospitalized over 1 month ago for gross hematuria related to UTI. Since discharge his urine has been clear. He has a hx of prostatectomy in 1999. He required bladder neck dilation for a bladder neck contracture. He has rare UTIs. He has a strong urinary stream. He has urinary urgency and frequency. He has nocturnal enuresis. He denies feeling of incomplete emptying   PMH: Past Medical History:  Diagnosis Date  . Anemia   . Anxiety   . 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   . Cancer ([place])    prostate ca 1999/removal/ rad tx  . Carotid artery occlusion   . Depression   . DVT (deep venous thrombosis) ([place])   . Gout   . History of kidney stones   . Hyperlipidemia   . Hypertension   . Incontinence   . Reflux   . Renal disorder   . Status post implantation of artificial urinary sphincter   . Wears dentures   . Wears glasses     Surgical History: Past Surgical History:  Procedure Laterality Date  . AV FISTULA PLACEMENT Left 05/15/2020   Procedure: LEFT FOREARM  ARTERIOVENOUS (AV) GRAFT INSERTION;  Surgeon: Angelia Mould, MD;  Location: [facility];  Service: Vascular;  Laterality: Left;  . CARDIAC CATHETERIZATION     2000-2001  . cataracts    . CORONARY STENT PLACEMENT    . HERNIA REPAIR    . MULTIPLE TOOTH EXTRACTIONS    . NEPHROSTOMY    . radical prostectomy    . URINARY SPHINCTER IMPLANT      Home Medications:  Allergies as of 07/10/2020      Reactions   Ivp Dye [iodinated Diagnostic Agents]    Due to partial renal failure   Nalbuphine Other (See Comments)   PATIENT STATES IT MADE HIM FEEL LIKE HE WAS GOING TO DIE. NO SPECIFICS   Codeine Anxiety        Medication List       Accurate as of July 10, 2020  3:36 PM. If you have any questions, ask your nurse or doctor.        acetaminophen 500 MG tablet Commonly known as: TYLENOL Take 1,000 mg by mouth every 8 (eight) hours as needed for mild pain or moderate pain. For headache pain   ALFALFA PO Take 250 mg by mouth 2 (two) times daily.   allopurinol 100 MG tablet Commonly known as: ZYLOPRIM Take 100 mg by mouth daily.   ALPRAZolam 0.5 MG tablet Commonly known as: XANAX Take 1 tablet (0.5 mg total) by mouth 2 (two) times daily as needed for anxiety.   amLODipine 10 MG tablet Commonly known as: NORVASC Take 10 mg by mouth daily.   ARIPiprazole 20 MG tablet Commonly known as: ABILIFY Take 1 tablet (20 mg total) by mouth daily.   carvedilol 3.125 MG tablet Commonly known as: COREG Take 6.25 mg by mouth 2 (two) times daily.   diphenhydrAMINE 2 % cream Commonly known as: BENADRYL Apply topically.   epoetin alfa 3000 UNIT/ML injection Commonly known as: EPOGEN Inject 3,000 Units into the vein every 14 (fourteen) days.   fish oil-omega-3 fatty acids  1000 MG capsule Take 1 g by mouth daily.   furosemide 40 MG tablet Commonly known as: LASIX Take 40 mg by mouth daily as needed.   gabapentin 100 MG capsule Commonly known as: NEURONTIN Take 1 capsule (100 mg total) by mouth at bedtime.   HYDROcodone-acetaminophen 5-325 MG tablet Commonly known as: Norco Take 1 tablet by mouth every 6 (six) hours as needed for moderate pain.   lansoprazole 15 MG capsule Commonly known as: PREVACID Take 1 capsule (15 mg total) by mouth 2 (two) times daily before a meal.   meclizine 12.5 MG tablet Commonly known as: ANTIVERT Take 12.5 mg by mouth daily.   multivitamin with minerals Tabs tablet Take 1 tablet by mouth daily.   ofloxacin 0.3 % OTIC solution Commonly known as: FLOXIN Place 5 drops into both ears daily.   Risa-Bid Probiotic Tabs Give 1 tablet by mouth twice a day  from 09/14/2018-09/24/2018   rosuvastatin 10 MG tablet Commonly known as: CRESTOR Take 10 mg by mouth daily.   sertraline 100 MG tablet Commonly known as: ZOLOFT Take 1 tablet (100 mg total) by mouth daily.   sodium bicarbonate 650 MG tablet Take 1 tablet (650 mg total) by mouth 3 (three) times daily.   traMADol 50 MG tablet Commonly known as: Ultram Take 1 tablet (50 mg total) by mouth every 6 (six) hours as needed.       Allergies:  Allergies  Allergen Reactions  . Ivp Dye [Iodinated Diagnostic Agents]     Due to partial renal failure  . Nalbuphine Other (See Comments)    PATIENT STATES IT MADE HIM FEEL LIKE HE WAS GOING TO DIE. NO SPECIFICS  . Codeine Anxiety    Family History: Family History  Problem Relation Age of Onset  . Coronary artery disease Other        family history, male < 70  . Arthritis Other        family history  . Cancer Other   . Kidney disease Other     Social History:  reports that he quit smoking about 47 years ago. His smoking use included cigarettes. He started smoking about 57 years ago. He smoked 2.00 packs per day. He has never used smokeless tobacco. He reports that he does not drink alcohol and does not use drugs.  ROS: All other review of systems were reviewed and are negative except what is noted above in HPI  Physical Exam: BP 120/67   Pulse 76   Temp 98.4 F (36.9 C)   Ht 5\' 8"  (1.727 m)   Wt 180 lb (81.6 kg)   BMI 27.37 kg/m   Constitutional:  Alert and oriented, No acute distress. HEENT: [place] AT, moist mucus membranes.  Trachea midline, no masses. Cardiovascular: No clubbing, cyanosis, or edema. Respiratory: Normal respiratory effort, no increased work of breathing. GI: Abdomen is soft, nontender, nondistended, no abdominal masses GU: No CVA tenderness.  Lymph: No cervical or inguinal lymphadenopathy. Skin: No rashes, bruises or suspicious lesions. Neurologic: Grossly intact, no focal deficits, moving all 4  extremities. Psychiatric: Normal mood and affect.  Laboratory Data: Lab Results  Component Value Date   WBC 7.3 06/06/2020   HGB 8.3 (L) 07/07/2020   HCT 23.1 (L) 06/06/2020   MCV 99.1 06/06/2020   PLT 142 (L) 06/06/2020    Lab Results  Component Value Date   CREATININE 2.50 (H) 06/06/2020    No results found for: PSA  No results found for: TESTOSTERONE  Lab  Results  Component Value Date   HGBA1C 5.5 05/29/2020    Urinalysis    Component Value Date/Time   COLORURINE AMBER (A) 05/29/2020 1524   APPEARANCEUR HAZY (A) 05/29/2020 1524   LABSPEC 1.008 05/29/2020 1524   PHURINE 6.0 05/29/2020 1524   GLUCOSEU NEGATIVE 05/29/2020 1524   HGBUR LARGE (A) 05/29/2020 1524   BILIRUBINUR NEGATIVE 05/29/2020 1524   KETONESUR NEGATIVE 05/29/2020 1524   PROTEINUR 100 (A) 05/29/2020 1524   UROBILINOGEN 0.2 03/14/2010 0809   NITRITE NEGATIVE 05/29/2020 1524   LEUKOCYTESUR MODERATE (A) 05/29/2020 1524    Lab Results  Component Value Date   BACTERIA NONE SEEN 05/29/2020    Pertinent Imaging:  No results found for this or any previous visit.  No results found for this or any previous visit.  No results found for this or any previous visit.  No results found for this or any previous visit.  Results for orders placed during the hospital encounter of 12/10/19  US RENAL  Narrative CLINICAL DATA:  Hematuria.  EXAM: RENAL / URINARY TRACT ULTRASOUND COMPLETE  COMPARISON:  Prior renal ultrasound on 09/07/2018  FINDINGS: Right Kidney:  Renal measurements: 10.7 x 5.7 x 5.4 cm = volume: 171 mL . Echogenicity within normal limits. Minimal cortical thinning. No mass, shadowing calculi or hydronephrosis visualized.  Left Kidney:  Renal measurements: 11.2 x 5.6 x 5.1 cm = volume: 167 mL. Echogenicity within normal limits. No mass, shadowing calculi or hydronephrosis visualized.  Bladder:  Appears normal for degree of bladder  distention.  Other:  None.  IMPRESSION: No evidence of renal mass or hydronephrosis by ultrasound. No shadowing calculi identified.   Electronically Signed By: Aletta Edouard M.D. On: 12/10/2019 13:08  No results found for this or any previous visit.  No results found for this or any previous visit.  Results for orders placed during the hospital encounter of 05/28/20  CT Renal Stone Study  Narrative CLINICAL DATA:  Urinary retention. Lower abdominal pain and groin pain  EXAM: CT ABDOMEN AND PELVIS WITHOUT CONTRAST  TECHNIQUE: Multidetector CT imaging of the abdomen and pelvis was performed following the standard protocol without IV contrast.  COMPARISON:  July 19, 2018.  FINDINGS: Lower chest: The lung bases are clear. The heart is enlarged.  Hepatobiliary: The liver is normal. Normal gallbladder.There is no biliary ductal dilation.  Pancreas: Normal contours without ductal dilatation. No peripancreatic fluid collection.  Spleen: Unremarkable.  Adrenals/Urinary Tract:  --Adrenal glands: Unremarkable.  --Right kidney/ureter: There is mild right-sided hydroureteronephrosis to the level of the urinary bladder. There is no evidence for an obstructing stone.  --Left kidney/ureter: There is mild left-sided hydroureteronephrosis to the level of the urinary bladder. There is no evidence for an obstructing stone.  --Urinary bladder: The urinary bladder is decompressed with a Foley catheter. There is bladder wall thickening.  Stomach/Bowel:  --Stomach/Duodenum: No hiatal hernia or other gastric abnormality. Normal duodenal course and caliber.  --Small bowel: Unremarkable.  --Colon: Rectosigmoid diverticulosis without acute inflammation.  --Appendix: Normal.  Vascular/Lymphatic: Atherosclerotic calcification is present within the non-aneurysmal abdominal aorta, without hemodynamically significant stenosis.  --No retroperitoneal  lymphadenopathy.  --No mesenteric lymphadenopathy.  --No pelvic or inguinal lymphadenopathy.  Reproductive: The patient is status post prior prostatectomy.  Other: No ascites or free air. There is a fat containing left inguinal hernia.  Musculoskeletal. There is an age-indeterminate compression fracture of the L3 vertebral body, new since 2019.  IMPRESSION: 1. Mild bilateral hydroureteronephrosis to the level of the urinary bladder. There  is no evidence for an obstructing stone. 2. Rectosigmoid diverticulosis without acute inflammation. 3. Fat containing left inguinal hernia.  Aortic Atherosclerosis (ICD10-I70.0).   Electronically Signed By: Constance Holster M.D. On: 05/28/2020 18:59   Assessment & Plan:    1. Gross hematuria -Likely related to UTI and hx of salvage radiation therapy. I will see him back in 6 months with possible cystoscopy - Urinalysis, Routine w reflex microscopic   No follow-ups on file.  Nicolette Bang, MD  [facility] [place]

## 2020-07-18 ENCOUNTER — Telehealth: Payer: Self-pay

## 2020-07-18 NOTE — Telephone Encounter (Signed)
MD sent message about samples pt is requesting.

## 2020-07-19 DIAGNOSIS — N136 Pyonephrosis: Secondary | ICD-10-CM | POA: Diagnosis not present

## 2020-07-19 DIAGNOSIS — Z8546 Personal history of malignant neoplasm of prostate: Secondary | ICD-10-CM | POA: Diagnosis not present

## 2020-07-19 DIAGNOSIS — N186 End stage renal disease: Secondary | ICD-10-CM | POA: Diagnosis not present

## 2020-07-19 DIAGNOSIS — I120 Hypertensive chronic kidney disease with stage 5 chronic kidney disease or end stage renal disease: Secondary | ICD-10-CM | POA: Diagnosis not present

## 2020-07-19 DIAGNOSIS — E785 Hyperlipidemia, unspecified: Secondary | ICD-10-CM | POA: Diagnosis not present

## 2020-07-19 DIAGNOSIS — I6523 Occlusion and stenosis of bilateral carotid arteries: Secondary | ICD-10-CM | POA: Diagnosis not present

## 2020-07-19 NOTE — Telephone Encounter (Signed)
Please give him mirabegron 25mg 

## 2020-07-19 NOTE — Telephone Encounter (Signed)
Samples left at desk. Pt called and notified.

## 2020-07-21 ENCOUNTER — Encounter (HOSPITAL_COMMUNITY): Admission: RE | Admit: 2020-07-21 | Payer: MEDICARE | Source: Ambulatory Visit

## 2020-07-21 ENCOUNTER — Encounter (HOSPITAL_COMMUNITY): Payer: MEDICARE

## 2020-07-22 DIAGNOSIS — I120 Hypertensive chronic kidney disease with stage 5 chronic kidney disease or end stage renal disease: Secondary | ICD-10-CM | POA: Diagnosis not present

## 2020-07-22 DIAGNOSIS — E785 Hyperlipidemia, unspecified: Secondary | ICD-10-CM | POA: Diagnosis not present

## 2020-07-22 DIAGNOSIS — I6523 Occlusion and stenosis of bilateral carotid arteries: Secondary | ICD-10-CM | POA: Diagnosis not present

## 2020-07-22 DIAGNOSIS — M6289 Other specified disorders of muscle: Secondary | ICD-10-CM | POA: Diagnosis not present

## 2020-07-22 DIAGNOSIS — Z8546 Personal history of malignant neoplasm of prostate: Secondary | ICD-10-CM | POA: Diagnosis not present

## 2020-07-22 DIAGNOSIS — Z86718 Personal history of other venous thrombosis and embolism: Secondary | ICD-10-CM | POA: Diagnosis not present

## 2020-07-22 DIAGNOSIS — N186 End stage renal disease: Secondary | ICD-10-CM | POA: Diagnosis not present

## 2020-07-22 DIAGNOSIS — N136 Pyonephrosis: Secondary | ICD-10-CM | POA: Diagnosis not present

## 2020-07-24 ENCOUNTER — Encounter (HOSPITAL_COMMUNITY)
Admission: RE | Admit: 2020-07-24 | Discharge: 2020-07-24 | Disposition: A | Discharge status: Home / Self Care | Payer: MEDICARE | Source: Ambulatory Visit | Attending: Nephrology | Admitting: Nephrology

## 2020-07-24 ENCOUNTER — Other Ambulatory Visit: Payer: Self-pay

## 2020-07-24 ENCOUNTER — Encounter (HOSPITAL_COMMUNITY): Payer: Self-pay

## 2020-07-24 DIAGNOSIS — N184 Chronic kidney disease, stage 4 (severe): Secondary | ICD-10-CM | POA: Diagnosis not present

## 2020-07-24 DIAGNOSIS — D631 Anemia in chronic kidney disease: Secondary | ICD-10-CM | POA: Diagnosis not present

## 2020-07-24 LAB — PROTEIN / CREATININE RATIO, URINE
Creatinine, Urine: 30.29 mg/dL
Protein Creatinine Ratio: 0.83 mg/mg{Cre} — ABNORMAL HIGH (ref 0.00–0.15)
Total Protein, Urine: 25 mg/dL

## 2020-07-24 LAB — POCT HEMOGLOBIN-HEMACUE: Hemoglobin: 9 g/dL — ABNORMAL LOW (ref 13.0–17.0)

## 2020-07-24 MED ORDER — EPOETIN ALFA 3000 UNIT/ML IJ SOLN
3000.0000 [IU] | Freq: Once | INTRAMUSCULAR | Status: AC
  Administered 2020-07-24: 3000 [IU] via SUBCUTANEOUS

## 2020-07-24 MED ORDER — EPOETIN ALFA 3000 UNIT/ML IJ SOLN
INTRAMUSCULAR | Status: AC
  Filled 2020-07-24: qty 1

## 2020-08-02 DIAGNOSIS — N136 Pyonephrosis: Secondary | ICD-10-CM | POA: Diagnosis not present

## 2020-08-02 DIAGNOSIS — Z8546 Personal history of malignant neoplasm of prostate: Secondary | ICD-10-CM | POA: Diagnosis not present

## 2020-08-02 DIAGNOSIS — I120 Hypertensive chronic kidney disease with stage 5 chronic kidney disease or end stage renal disease: Secondary | ICD-10-CM | POA: Diagnosis not present

## 2020-08-02 DIAGNOSIS — I6523 Occlusion and stenosis of bilateral carotid arteries: Secondary | ICD-10-CM | POA: Diagnosis not present

## 2020-08-02 DIAGNOSIS — N186 End stage renal disease: Secondary | ICD-10-CM | POA: Diagnosis not present

## 2020-08-02 DIAGNOSIS — E785 Hyperlipidemia, unspecified: Secondary | ICD-10-CM | POA: Diagnosis not present

## 2020-08-08 ENCOUNTER — Encounter (HOSPITAL_COMMUNITY): Payer: Self-pay

## 2020-08-08 ENCOUNTER — Encounter (HOSPITAL_COMMUNITY)
Admission: RE | Admit: 2020-08-08 | Discharge: 2020-08-08 | Disposition: A | Discharge status: Home / Self Care | Payer: MEDICARE | Source: Ambulatory Visit | Attending: Nephrology | Admitting: Nephrology

## 2020-08-08 ENCOUNTER — Other Ambulatory Visit: Payer: Self-pay

## 2020-08-08 DIAGNOSIS — N185 Chronic kidney disease, stage 5: Secondary | ICD-10-CM | POA: Diagnosis not present

## 2020-08-08 DIAGNOSIS — D631 Anemia in chronic kidney disease: Secondary | ICD-10-CM | POA: Diagnosis not present

## 2020-08-08 DIAGNOSIS — R21 Rash and other nonspecific skin eruption: Secondary | ICD-10-CM | POA: Diagnosis not present

## 2020-08-08 LAB — POCT HEMOGLOBIN-HEMACUE: Hemoglobin: 9.4 g/dL — ABNORMAL LOW (ref 13.0–17.0)

## 2020-08-08 MED ORDER — EPOETIN ALFA 3000 UNIT/ML IJ SOLN
INTRAMUSCULAR | Status: AC
  Filled 2020-08-08: qty 1

## 2020-08-08 MED ORDER — EPOETIN ALFA 3000 UNIT/ML IJ SOLN
3000.0000 [IU] | Freq: Once | INTRAMUSCULAR | Status: AC
  Administered 2020-08-08: 3000 [IU] via SUBCUTANEOUS

## 2020-08-16 DIAGNOSIS — I6523 Occlusion and stenosis of bilateral carotid arteries: Secondary | ICD-10-CM | POA: Diagnosis not present

## 2020-08-16 DIAGNOSIS — Z8546 Personal history of malignant neoplasm of prostate: Secondary | ICD-10-CM | POA: Diagnosis not present

## 2020-08-16 DIAGNOSIS — N186 End stage renal disease: Secondary | ICD-10-CM | POA: Diagnosis not present

## 2020-08-16 DIAGNOSIS — E785 Hyperlipidemia, unspecified: Secondary | ICD-10-CM | POA: Diagnosis not present

## 2020-08-16 DIAGNOSIS — N136 Pyonephrosis: Secondary | ICD-10-CM | POA: Diagnosis not present

## 2020-08-16 DIAGNOSIS — I120 Hypertensive chronic kidney disease with stage 5 chronic kidney disease or end stage renal disease: Secondary | ICD-10-CM | POA: Diagnosis not present

## 2020-08-21 DIAGNOSIS — L02435 Carbuncle of right lower limb: Secondary | ICD-10-CM | POA: Diagnosis not present

## 2020-08-21 DIAGNOSIS — L02239 Carbuncle of trunk, unspecified: Secondary | ICD-10-CM | POA: Diagnosis not present

## 2020-08-22 ENCOUNTER — Encounter (HOSPITAL_COMMUNITY)
Admission: RE | Admit: 2020-08-22 | Discharge: 2020-08-22 | Disposition: A | Discharge status: Home / Self Care | Payer: MEDICARE | Source: Ambulatory Visit | Attending: Nephrology | Admitting: Nephrology

## 2020-08-22 ENCOUNTER — Other Ambulatory Visit: Payer: Self-pay

## 2020-08-22 ENCOUNTER — Encounter (HOSPITAL_COMMUNITY): Payer: Self-pay

## 2020-08-22 DIAGNOSIS — N185 Chronic kidney disease, stage 5: Secondary | ICD-10-CM | POA: Diagnosis not present

## 2020-08-22 DIAGNOSIS — D631 Anemia in chronic kidney disease: Secondary | ICD-10-CM | POA: Diagnosis not present

## 2020-08-22 LAB — POCT HEMOGLOBIN-HEMACUE: Hemoglobin: 10.2 g/dL — ABNORMAL LOW (ref 13.0–17.0)

## 2020-08-22 MED ORDER — EPOETIN ALFA 3000 UNIT/ML IJ SOLN: 3000.0000 [IU] | Freq: Once | INTRAMUSCULAR | Status: DC

## 2020-08-23 DIAGNOSIS — I120 Hypertensive chronic kidney disease with stage 5 chronic kidney disease or end stage renal disease: Secondary | ICD-10-CM | POA: Diagnosis not present

## 2020-08-23 DIAGNOSIS — N136 Pyonephrosis: Secondary | ICD-10-CM | POA: Diagnosis not present

## 2020-08-23 DIAGNOSIS — Z8546 Personal history of malignant neoplasm of prostate: Secondary | ICD-10-CM | POA: Diagnosis not present

## 2020-08-23 DIAGNOSIS — E785 Hyperlipidemia, unspecified: Secondary | ICD-10-CM | POA: Diagnosis not present

## 2020-08-23 DIAGNOSIS — I10 Essential (primary) hypertension: Secondary | ICD-10-CM | POA: Diagnosis not present

## 2020-08-23 DIAGNOSIS — Z0189 Encounter for other specified special examinations: Secondary | ICD-10-CM | POA: Diagnosis not present

## 2020-08-23 DIAGNOSIS — I25119 Atherosclerotic heart disease of native coronary artery with unspecified angina pectoris: Secondary | ICD-10-CM | POA: Diagnosis not present

## 2020-08-23 DIAGNOSIS — G4700 Insomnia, unspecified: Secondary | ICD-10-CM | POA: Diagnosis not present

## 2020-08-23 DIAGNOSIS — I6523 Occlusion and stenosis of bilateral carotid arteries: Secondary | ICD-10-CM | POA: Diagnosis not present

## 2020-08-23 DIAGNOSIS — N186 End stage renal disease: Secondary | ICD-10-CM | POA: Diagnosis not present

## 2020-08-23 DIAGNOSIS — Z86718 Personal history of other venous thrombosis and embolism: Secondary | ICD-10-CM | POA: Diagnosis not present

## 2020-08-23 DIAGNOSIS — K219 Gastro-esophageal reflux disease without esophagitis: Secondary | ICD-10-CM | POA: Diagnosis not present

## 2020-08-25 DIAGNOSIS — I129 Hypertensive chronic kidney disease with stage 1 through stage 4 chronic kidney disease, or unspecified chronic kidney disease: Secondary | ICD-10-CM | POA: Diagnosis not present

## 2020-08-25 DIAGNOSIS — N184 Chronic kidney disease, stage 4 (severe): Secondary | ICD-10-CM | POA: Diagnosis not present

## 2020-08-25 DIAGNOSIS — N189 Chronic kidney disease, unspecified: Secondary | ICD-10-CM | POA: Diagnosis not present

## 2020-08-25 DIAGNOSIS — D631 Anemia in chronic kidney disease: Secondary | ICD-10-CM | POA: Diagnosis not present

## 2020-08-25 DIAGNOSIS — E211 Secondary hyperparathyroidism, not elsewhere classified: Secondary | ICD-10-CM | POA: Diagnosis not present

## 2020-09-05 ENCOUNTER — Encounter (HOSPITAL_COMMUNITY)
Admission: RE | Admit: 2020-09-05 | Discharge: 2020-09-05 | Disposition: A | Discharge status: Home / Self Care | Payer: MEDICARE | Source: Ambulatory Visit | Attending: Nephrology | Admitting: Nephrology

## 2020-09-05 ENCOUNTER — Other Ambulatory Visit: Payer: Self-pay

## 2020-09-05 ENCOUNTER — Encounter (HOSPITAL_COMMUNITY): Payer: Self-pay

## 2020-09-05 DIAGNOSIS — N184 Chronic kidney disease, stage 4 (severe): Secondary | ICD-10-CM | POA: Insufficient documentation

## 2020-09-05 DIAGNOSIS — D631 Anemia in chronic kidney disease: Secondary | ICD-10-CM | POA: Diagnosis not present

## 2020-09-05 LAB — POCT HEMOGLOBIN-HEMACUE: Hemoglobin: 9 g/dL — ABNORMAL LOW (ref 13.0–17.0)

## 2020-09-05 MED ORDER — EPOETIN ALFA 3000 UNIT/ML IJ SOLN
3000.0000 [IU] | Freq: Once | INTRAMUSCULAR | Status: AC
  Administered 2020-09-05: 3000 [IU] via SUBCUTANEOUS

## 2020-09-05 MED ORDER — EPOETIN ALFA 3000 UNIT/ML IJ SOLN
INTRAMUSCULAR | Status: AC
  Filled 2020-09-05: qty 1

## 2020-09-11 DIAGNOSIS — I25119 Atherosclerotic heart disease of native coronary artery with unspecified angina pectoris: Secondary | ICD-10-CM | POA: Diagnosis not present

## 2020-09-11 DIAGNOSIS — F419 Anxiety disorder, unspecified: Secondary | ICD-10-CM | POA: Diagnosis not present

## 2020-09-11 DIAGNOSIS — M1A00X0 Idiopathic chronic gout, unspecified site, without tophus (tophi): Secondary | ICD-10-CM | POA: Diagnosis not present

## 2020-09-11 DIAGNOSIS — Z23 Encounter for immunization: Secondary | ICD-10-CM | POA: Diagnosis not present

## 2020-09-11 DIAGNOSIS — F331 Major depressive disorder, recurrent, moderate: Secondary | ICD-10-CM | POA: Diagnosis not present

## 2020-09-11 DIAGNOSIS — K5900 Constipation, unspecified: Secondary | ICD-10-CM | POA: Diagnosis not present

## 2020-09-11 DIAGNOSIS — N185 Chronic kidney disease, stage 5: Secondary | ICD-10-CM | POA: Diagnosis not present

## 2020-09-11 DIAGNOSIS — E785 Hyperlipidemia, unspecified: Secondary | ICD-10-CM | POA: Diagnosis not present

## 2020-09-11 DIAGNOSIS — R42 Dizziness and giddiness: Secondary | ICD-10-CM | POA: Diagnosis not present

## 2020-09-11 DIAGNOSIS — I10 Essential (primary) hypertension: Secondary | ICD-10-CM | POA: Diagnosis not present

## 2020-09-11 DIAGNOSIS — G4700 Insomnia, unspecified: Secondary | ICD-10-CM | POA: Diagnosis not present

## 2020-09-11 DIAGNOSIS — K219 Gastro-esophageal reflux disease without esophagitis: Secondary | ICD-10-CM | POA: Diagnosis not present

## 2020-09-11 DIAGNOSIS — D631 Anemia in chronic kidney disease: Secondary | ICD-10-CM | POA: Diagnosis not present

## 2020-09-19 ENCOUNTER — Encounter (HOSPITAL_COMMUNITY)
Admission: RE | Admit: 2020-09-19 | Discharge: 2020-09-19 | Disposition: A | Discharge status: Home / Self Care | Payer: MEDICARE | Source: Ambulatory Visit | Attending: Nephrology | Admitting: Nephrology

## 2020-09-19 ENCOUNTER — Other Ambulatory Visit: Payer: Self-pay

## 2020-09-19 ENCOUNTER — Encounter (HOSPITAL_COMMUNITY): Payer: Self-pay

## 2020-09-19 DIAGNOSIS — N184 Chronic kidney disease, stage 4 (severe): Secondary | ICD-10-CM | POA: Diagnosis not present

## 2020-09-19 DIAGNOSIS — H9202 Otalgia, left ear: Secondary | ICD-10-CM | POA: Diagnosis not present

## 2020-09-19 DIAGNOSIS — D631 Anemia in chronic kidney disease: Secondary | ICD-10-CM | POA: Diagnosis not present

## 2020-09-19 LAB — POCT HEMOGLOBIN-HEMACUE: Hemoglobin: 8.9 g/dL — ABNORMAL LOW (ref 13.0–17.0)

## 2020-09-19 MED ORDER — EPOETIN ALFA 3000 UNIT/ML IJ SOLN
3000.0000 [IU] | Freq: Once | INTRAMUSCULAR | Status: AC
  Administered 2020-09-19: 3000 [IU] via SUBCUTANEOUS
  Filled 2020-09-19: qty 1

## 2020-09-20 DIAGNOSIS — L308 Other specified dermatitis: Secondary | ICD-10-CM | POA: Diagnosis not present

## 2020-10-03 ENCOUNTER — Encounter (HOSPITAL_COMMUNITY): Payer: Self-pay

## 2020-10-03 ENCOUNTER — Other Ambulatory Visit: Payer: Self-pay

## 2020-10-03 ENCOUNTER — Encounter (HOSPITAL_COMMUNITY)
Admission: RE | Admit: 2020-10-03 | Discharge: 2020-10-03 | Disposition: A | Discharge status: Home / Self Care | Payer: MEDICARE | Source: Ambulatory Visit | Attending: Nephrology | Admitting: Nephrology

## 2020-10-03 DIAGNOSIS — N184 Chronic kidney disease, stage 4 (severe): Secondary | ICD-10-CM | POA: Diagnosis not present

## 2020-10-03 DIAGNOSIS — D631 Anemia in chronic kidney disease: Secondary | ICD-10-CM | POA: Insufficient documentation

## 2020-10-03 DIAGNOSIS — B9689 Other specified bacterial agents as the cause of diseases classified elsewhere: Secondary | ICD-10-CM | POA: Diagnosis not present

## 2020-10-03 DIAGNOSIS — L02425 Furuncle of right lower limb: Secondary | ICD-10-CM | POA: Diagnosis not present

## 2020-10-03 LAB — POCT HEMOGLOBIN-HEMACUE: Hemoglobin: 9.4 g/dL — ABNORMAL LOW (ref 13.0–17.0)

## 2020-10-03 MED ORDER — EPOETIN ALFA 3000 UNIT/ML IJ SOLN
3000.0000 [IU] | Freq: Once | INTRAMUSCULAR | Status: AC
  Administered 2020-10-03: 3000 [IU] via SUBCUTANEOUS
  Filled 2020-10-03: qty 1

## 2020-10-09 DIAGNOSIS — B9689 Other specified bacterial agents as the cause of diseases classified elsewhere: Secondary | ICD-10-CM | POA: Diagnosis not present

## 2020-10-09 DIAGNOSIS — L02229 Furuncle of trunk, unspecified: Secondary | ICD-10-CM | POA: Diagnosis not present

## 2020-10-11 DIAGNOSIS — Z23 Encounter for immunization: Secondary | ICD-10-CM | POA: Diagnosis not present

## 2020-10-17 ENCOUNTER — Encounter (HOSPITAL_COMMUNITY)
Admission: RE | Admit: 2020-10-17 | Discharge: 2020-10-17 | Disposition: A | Discharge status: Home / Self Care | Payer: MEDICARE | Source: Ambulatory Visit | Attending: Nephrology | Admitting: Nephrology

## 2020-10-17 ENCOUNTER — Encounter (HOSPITAL_COMMUNITY): Payer: Self-pay

## 2020-10-17 ENCOUNTER — Other Ambulatory Visit: Payer: Self-pay

## 2020-10-17 DIAGNOSIS — D631 Anemia in chronic kidney disease: Secondary | ICD-10-CM | POA: Diagnosis not present

## 2020-10-17 DIAGNOSIS — N184 Chronic kidney disease, stage 4 (severe): Secondary | ICD-10-CM | POA: Diagnosis not present

## 2020-10-17 LAB — PROTEIN / CREATININE RATIO, URINE
Creatinine, Urine: 53.01 mg/dL
Protein Creatinine Ratio: 1.96 mg/mg{Cre} — ABNORMAL HIGH (ref 0.00–0.15)
Total Protein, Urine: 104 mg/dL

## 2020-10-17 LAB — POCT HEMOGLOBIN-HEMACUE: Hemoglobin: 10.3 g/dL — ABNORMAL LOW (ref 13.0–17.0)

## 2020-10-17 MED ORDER — EPOETIN ALFA 3000 UNIT/ML IJ SOLN: 3000.0000 [IU] | Freq: Once | INTRAMUSCULAR | Status: DC

## 2020-10-23 DIAGNOSIS — N189 Chronic kidney disease, unspecified: Secondary | ICD-10-CM | POA: Diagnosis not present

## 2020-10-23 DIAGNOSIS — L02229 Furuncle of trunk, unspecified: Secondary | ICD-10-CM | POA: Diagnosis not present

## 2020-10-23 DIAGNOSIS — E211 Secondary hyperparathyroidism, not elsewhere classified: Secondary | ICD-10-CM | POA: Diagnosis not present

## 2020-10-23 DIAGNOSIS — D631 Anemia in chronic kidney disease: Secondary | ICD-10-CM | POA: Diagnosis not present

## 2020-10-23 DIAGNOSIS — I129 Hypertensive chronic kidney disease with stage 1 through stage 4 chronic kidney disease, or unspecified chronic kidney disease: Secondary | ICD-10-CM | POA: Diagnosis not present

## 2020-10-23 DIAGNOSIS — N184 Chronic kidney disease, stage 4 (severe): Secondary | ICD-10-CM | POA: Diagnosis not present

## 2020-10-23 DIAGNOSIS — B9689 Other specified bacterial agents as the cause of diseases classified elsewhere: Secondary | ICD-10-CM | POA: Diagnosis not present

## 2020-10-24 DIAGNOSIS — D631 Anemia in chronic kidney disease: Secondary | ICD-10-CM | POA: Diagnosis not present

## 2020-10-24 DIAGNOSIS — N170 Acute kidney failure with tubular necrosis: Secondary | ICD-10-CM | POA: Diagnosis not present

## 2020-10-24 DIAGNOSIS — N189 Chronic kidney disease, unspecified: Secondary | ICD-10-CM | POA: Diagnosis not present

## 2020-10-24 DIAGNOSIS — N184 Chronic kidney disease, stage 4 (severe): Secondary | ICD-10-CM | POA: Diagnosis not present

## 2020-10-24 DIAGNOSIS — E872 Acidosis: Secondary | ICD-10-CM | POA: Diagnosis not present

## 2020-10-24 DIAGNOSIS — R809 Proteinuria, unspecified: Secondary | ICD-10-CM | POA: Diagnosis not present

## 2020-10-24 DIAGNOSIS — I129 Hypertensive chronic kidney disease with stage 1 through stage 4 chronic kidney disease, or unspecified chronic kidney disease: Secondary | ICD-10-CM | POA: Diagnosis not present

## 2020-10-25 ENCOUNTER — Other Ambulatory Visit: Payer: Self-pay | Admitting: Nephrology

## 2020-10-25 ENCOUNTER — Other Ambulatory Visit (HOSPITAL_COMMUNITY): Payer: Self-pay | Admitting: Nephrology

## 2020-10-25 DIAGNOSIS — R809 Proteinuria, unspecified: Secondary | ICD-10-CM

## 2020-10-25 DIAGNOSIS — I129 Hypertensive chronic kidney disease with stage 1 through stage 4 chronic kidney disease, or unspecified chronic kidney disease: Secondary | ICD-10-CM

## 2020-10-25 DIAGNOSIS — E872 Acidosis, unspecified: Secondary | ICD-10-CM

## 2020-10-25 DIAGNOSIS — N184 Chronic kidney disease, stage 4 (severe): Secondary | ICD-10-CM

## 2020-10-25 DIAGNOSIS — N189 Chronic kidney disease, unspecified: Secondary | ICD-10-CM

## 2020-10-25 DIAGNOSIS — D631 Anemia in chronic kidney disease: Secondary | ICD-10-CM

## 2020-10-25 DIAGNOSIS — N170 Acute kidney failure with tubular necrosis: Secondary | ICD-10-CM

## 2020-10-31 ENCOUNTER — Ambulatory Visit (HOSPITAL_COMMUNITY): Payer: MEDICARE

## 2020-10-31 ENCOUNTER — Other Ambulatory Visit (HOSPITAL_COMMUNITY): Payer: MEDICARE

## 2020-11-01 ENCOUNTER — Encounter (HOSPITAL_COMMUNITY): Payer: Self-pay

## 2020-11-01 ENCOUNTER — Encounter (HOSPITAL_COMMUNITY)
Admission: RE | Admit: 2020-11-01 | Discharge: 2020-11-01 | Disposition: A | Discharge status: Home / Self Care | Payer: MEDICARE | Source: Ambulatory Visit | Attending: Nephrology | Admitting: Nephrology

## 2020-11-01 ENCOUNTER — Other Ambulatory Visit: Payer: Self-pay

## 2020-11-01 DIAGNOSIS — N184 Chronic kidney disease, stage 4 (severe): Secondary | ICD-10-CM | POA: Insufficient documentation

## 2020-11-01 DIAGNOSIS — D631 Anemia in chronic kidney disease: Secondary | ICD-10-CM | POA: Insufficient documentation

## 2020-11-01 LAB — POCT HEMOGLOBIN-HEMACUE: Hemoglobin: 9.4 g/dL — ABNORMAL LOW (ref 13.0–17.0)

## 2020-11-01 MED ORDER — EPOETIN ALFA 3000 UNIT/ML IJ SOLN
3000.0000 [IU] | Freq: Once | INTRAMUSCULAR | Status: AC
  Administered 2020-11-01: 3000 [IU] via SUBCUTANEOUS

## 2020-11-01 MED ORDER — EPOETIN ALFA 3000 UNIT/ML IJ SOLN
INTRAMUSCULAR | Status: AC
  Filled 2020-11-01: qty 1

## 2020-11-02 ENCOUNTER — Ambulatory Visit (HOSPITAL_COMMUNITY)
Admission: RE | Admit: 2020-11-02 | Discharge: 2020-11-02 | Disposition: A | Discharge status: Home / Self Care | Payer: MEDICARE | Source: Ambulatory Visit | Attending: Nephrology | Admitting: Nephrology

## 2020-11-02 DIAGNOSIS — N170 Acute kidney failure with tubular necrosis: Secondary | ICD-10-CM

## 2020-11-02 DIAGNOSIS — I129 Hypertensive chronic kidney disease with stage 1 through stage 4 chronic kidney disease, or unspecified chronic kidney disease: Secondary | ICD-10-CM | POA: Diagnosis not present

## 2020-11-02 DIAGNOSIS — N184 Chronic kidney disease, stage 4 (severe): Secondary | ICD-10-CM

## 2020-11-02 DIAGNOSIS — R809 Proteinuria, unspecified: Secondary | ICD-10-CM | POA: Insufficient documentation

## 2020-11-02 DIAGNOSIS — E872 Acidosis, unspecified: Secondary | ICD-10-CM

## 2020-11-02 DIAGNOSIS — D631 Anemia in chronic kidney disease: Secondary | ICD-10-CM | POA: Diagnosis not present

## 2020-11-02 DIAGNOSIS — N189 Chronic kidney disease, unspecified: Secondary | ICD-10-CM | POA: Insufficient documentation

## 2020-11-06 DIAGNOSIS — L308 Other specified dermatitis: Secondary | ICD-10-CM | POA: Diagnosis not present

## 2020-11-06 DIAGNOSIS — B9689 Other specified bacterial agents as the cause of diseases classified elsewhere: Secondary | ICD-10-CM | POA: Diagnosis not present

## 2020-11-06 DIAGNOSIS — L02425 Furuncle of right lower limb: Secondary | ICD-10-CM | POA: Diagnosis not present

## 2020-11-09 DIAGNOSIS — I129 Hypertensive chronic kidney disease with stage 1 through stage 4 chronic kidney disease, or unspecified chronic kidney disease: Secondary | ICD-10-CM | POA: Diagnosis not present

## 2020-11-09 DIAGNOSIS — R809 Proteinuria, unspecified: Secondary | ICD-10-CM | POA: Diagnosis not present

## 2020-11-09 DIAGNOSIS — N184 Chronic kidney disease, stage 4 (severe): Secondary | ICD-10-CM | POA: Diagnosis not present

## 2020-11-09 DIAGNOSIS — D631 Anemia in chronic kidney disease: Secondary | ICD-10-CM | POA: Diagnosis not present

## 2020-11-09 DIAGNOSIS — N189 Chronic kidney disease, unspecified: Secondary | ICD-10-CM | POA: Diagnosis not present

## 2020-11-15 ENCOUNTER — Encounter (HOSPITAL_COMMUNITY): Payer: Self-pay

## 2020-11-15 ENCOUNTER — Other Ambulatory Visit: Payer: Self-pay

## 2020-11-15 ENCOUNTER — Encounter (HOSPITAL_COMMUNITY)
Admission: RE | Admit: 2020-11-15 | Discharge: 2020-11-15 | Disposition: A | Discharge status: Home / Self Care | Payer: MEDICARE | Source: Ambulatory Visit | Attending: Nephrology | Admitting: Nephrology

## 2020-11-15 DIAGNOSIS — D631 Anemia in chronic kidney disease: Secondary | ICD-10-CM | POA: Diagnosis not present

## 2020-11-15 DIAGNOSIS — N184 Chronic kidney disease, stage 4 (severe): Secondary | ICD-10-CM | POA: Diagnosis not present

## 2020-11-15 LAB — POCT HEMOGLOBIN-HEMACUE: Hemoglobin: 9.6 g/dL — ABNORMAL LOW (ref 13.0–17.0)

## 2020-11-15 MED ORDER — EPOETIN ALFA 3000 UNIT/ML IJ SOLN
3000.0000 [IU] | Freq: Once | INTRAMUSCULAR | Status: AC
  Administered 2020-11-15: 3000 [IU] via SUBCUTANEOUS
  Filled 2020-11-15: qty 1

## 2020-11-16 DIAGNOSIS — N185 Chronic kidney disease, stage 5: Secondary | ICD-10-CM | POA: Diagnosis not present

## 2020-11-16 DIAGNOSIS — E872 Acidosis: Secondary | ICD-10-CM | POA: Diagnosis not present

## 2020-11-16 DIAGNOSIS — N189 Chronic kidney disease, unspecified: Secondary | ICD-10-CM | POA: Diagnosis not present

## 2020-11-16 DIAGNOSIS — D631 Anemia in chronic kidney disease: Secondary | ICD-10-CM | POA: Diagnosis not present

## 2020-11-16 DIAGNOSIS — R809 Proteinuria, unspecified: Secondary | ICD-10-CM | POA: Diagnosis not present

## 2020-11-16 DIAGNOSIS — E211 Secondary hyperparathyroidism, not elsewhere classified: Secondary | ICD-10-CM | POA: Diagnosis not present

## 2020-11-22 DIAGNOSIS — M5442 Lumbago with sciatica, left side: Secondary | ICD-10-CM | POA: Diagnosis not present

## 2020-11-30 ENCOUNTER — Encounter (HOSPITAL_COMMUNITY)
Admission: RE | Admit: 2020-11-30 | Discharge: 2020-11-30 | Disposition: A | Discharge status: Home / Self Care | Payer: MEDICARE | Source: Ambulatory Visit | Attending: Nephrology | Admitting: Nephrology

## 2020-11-30 ENCOUNTER — Other Ambulatory Visit: Payer: Self-pay

## 2020-11-30 ENCOUNTER — Encounter (HOSPITAL_COMMUNITY): Payer: Self-pay

## 2020-11-30 DIAGNOSIS — D631 Anemia in chronic kidney disease: Secondary | ICD-10-CM | POA: Diagnosis not present

## 2020-11-30 DIAGNOSIS — N184 Chronic kidney disease, stage 4 (severe): Secondary | ICD-10-CM | POA: Diagnosis not present

## 2020-11-30 LAB — POCT HEMOGLOBIN-HEMACUE: Hemoglobin: 9.9 g/dL — ABNORMAL LOW (ref 13.0–17.0)

## 2020-11-30 MED ORDER — EPOETIN ALFA 3000 UNIT/ML IJ SOLN
INTRAMUSCULAR | Status: AC
  Filled 2020-11-30: qty 1

## 2020-11-30 MED ORDER — EPOETIN ALFA 3000 UNIT/ML IJ SOLN
3000.0000 [IU] | Freq: Once | INTRAMUSCULAR | Status: AC
  Administered 2020-11-30: 3000 [IU] via SUBCUTANEOUS

## 2020-12-11 ENCOUNTER — Encounter: Payer: Self-pay | Admitting: Orthopedic Surgery

## 2020-12-11 ENCOUNTER — Ambulatory Visit (INDEPENDENT_AMBULATORY_CARE_PROVIDER_SITE_OTHER): Payer: MEDICARE | Admitting: Orthopedic Surgery

## 2020-12-11 ENCOUNTER — Other Ambulatory Visit: Payer: Self-pay

## 2020-12-11 VITALS — BP 153/66 | HR 102 | Ht 68.0 in | Wt 178.0 lb

## 2020-12-11 DIAGNOSIS — M48062 Spinal stenosis, lumbar region with neurogenic claudication: Secondary | ICD-10-CM | POA: Diagnosis not present

## 2020-12-11 NOTE — Patient Instructions (Signed)
Therapy at home 3 x a week  Pain management referral

## 2020-12-11 NOTE — Progress Notes (Signed)
[name] [name]  12/11/2020  Body mass index is 27.06 kg/m.  ASSESSMENT AND PLAN:     87 year old male 4-week history of back and leg pain no acute neurologic deficits.  Seems to need something for pain.  We do not give pain medicine/Center for pain management and added physical therapy to be done at home because of his immobility   No diagnosis found.  Imaging Radiographs were obtained on March 14, 2020 [facility],  My interpretation of those images   L3 compression fracture and he has multilevel degenerative spondylosis   HISTORY SECTION :  Chief Complaint  Patient presents with  . Back Pain    Into both legs for 4 weeks    87 year old male back pain bilateral leg pain seems to be walking worse since he was put on muscle relaxer.  Caregiver is with him and the patient also notes that he cannot take Tylenol because he has kidney problems and also cannot take anti-inflammatories      Review of Systems  Constitutional: Negative for chills and fever.  Neurological: Negative for tingling, sensory change and weakness.     has a past medical history of Anemia, Anxiety, Artificial opening care, other, Cancer ([place]), Carotid artery occlusion, Depression, DVT (deep venous thrombosis) ([place]), Gout, History of kidney stones, Hyperlipidemia, Hypertension, Incontinence, Reflux, Renal disorder, Status post implantation of artificial urinary sphincter, Wears dentures, and Wears glasses.   Past Surgical History:  Procedure Laterality Date  . AV FISTULA PLACEMENT Left 05/15/2020   Procedure: LEFT FOREARM  ARTERIOVENOUS (AV) GRAFT INSERTION;  Surgeon: Angelia Mould, MD;  Location: [facility];  Service: Vascular;  Laterality: Left;  . CARDIAC CATHETERIZATION     2000-2001  . cataracts    . CORONARY STENT PLACEMENT    . HERNIA REPAIR    . MULTIPLE TOOTH EXTRACTIONS    . NEPHROSTOMY    . radical prostectomy    . URINARY SPHINCTER IMPLANT      Social History   Tobacco Use   . Smoking status: Former Smoker    Packs/day: 2.00    Types: Cigarettes    Start date: 08/05/1962    Quit date: 12/02/1972    Years since quitting: 48.0  . Smokeless tobacco: Never Used  Vaping Use  . Vaping Use: Never used  Substance Use Topics  . Alcohol use: No  . Drug use: No    Family History  Problem Relation Age of Onset  . Coronary artery disease Other        family history, male < 40  . Arthritis Other        family history  . Cancer Other   . Kidney disease Other       Allergies  Allergen Reactions  . Ivp Dye [Iodinated Diagnostic Agents]     Due to partial renal failure  . Nalbuphine Other (See Comments)    PATIENT STATES IT MADE HIM FEEL LIKE HE WAS GOING TO DIE. NO SPECIFICS  . Codeine Anxiety     Current Outpatient Medications:  .  acetaminophen (TYLENOL) 500 MG tablet, Take 1,000 mg by mouth every 8 (eight) hours as needed for mild pain or moderate pain. For headache pain, Disp: , Rfl:  .  ALFALFA PO, Take 250 mg by mouth 2 (two) times daily. , Disp: , Rfl:  .  allopurinol (ZYLOPRIM) 100 MG tablet, Take 100 mg by mouth daily. , Disp: , Rfl: 0 .  ALPRAZolam (XANAX) 0.5 MG tablet, Take 1  tablet (0.5 mg total) by mouth 2 (two) times daily as needed for anxiety., Disp: 15 tablet, Rfl: 0 .  amLODipine (NORVASC) 10 MG tablet, Take 10 mg by mouth daily., Disp: , Rfl:  .  ARIPiprazole (ABILIFY) 20 MG tablet, Take 1 tablet (20 mg total) by mouth daily., Disp: 30 tablet, Rfl: 3 .  carvedilol (COREG) 3.125 MG tablet, Take 6.25 mg by mouth 2 (two) times daily., Disp: , Rfl:  .  diphenhydrAMINE (BENADRYL) 2 % cream, Apply topically., Disp: , Rfl:  .  epoetin alfa (EPOGEN) 3000 UNIT/ML injection, Inject 3,000 Units into the skin every 14 (fourteen) days. , Disp: , Rfl:  .  fish oil-omega-3 fatty acids 1000 MG capsule, Take 1 g by mouth daily., Disp: , Rfl:  .  furosemide (LASIX) 40 MG tablet, Take 40 mg by mouth daily as needed., Disp: , Rfl:  .  gabapentin (NEURONTIN)  100 MG capsule, Take 1 capsule (100 mg total) by mouth at bedtime., Disp: 30 capsule, Rfl: 3 .  HYDROcodone-acetaminophen (NORCO) 5-325 MG tablet, Take 1 tablet by mouth every 6 (six) hours as needed for moderate pain., Disp: 12 tablet, Rfl: 0 .  lansoprazole (PREVACID) 15 MG capsule, Take 1 capsule (15 mg total) by mouth 2 (two) times daily before a meal., Disp: 60 capsule, Rfl: 4 .  meclizine (ANTIVERT) 12.5 MG tablet, Take 12.5 mg by mouth daily., Disp: , Rfl:  .  Multiple Vitamin (MULTIVITAMIN WITH MINERALS) TABS tablet, Take 1 tablet by mouth daily., Disp: , Rfl:  .  ofloxacin (FLOXIN) 0.3 % OTIC solution, Place 5 drops into both ears daily. , Disp: , Rfl: 2 .  Probiotic Product (RISA-BID PROBIOTIC) TABS, Give 1 tablet by mouth twice a day from 09/14/2018-09/24/2018, Disp: , Rfl:  .  rosuvastatin (CRESTOR) 10 MG tablet, Take 10 mg by mouth daily., Disp: , Rfl:  .  sertraline (ZOLOFT) 100 MG tablet, Take 1 tablet (100 mg total) by mouth daily., Disp: 30 tablet, Rfl: 3 .  sodium bicarbonate 650 MG tablet, Take 1 tablet (650 mg total) by mouth 3 (three) times daily., Disp: 90 tablet, Rfl: 1 .  traMADol (ULTRAM) 50 MG tablet, Take 1 tablet (50 mg total) by mouth every 6 (six) hours as needed., Disp: 15 tablet, Rfl: 0   PHYSICAL EXAM SECTION: BP (!) 153/66   Pulse (!) 102   Ht 5\' 8"  (1.727 m)   Wt 178 lb (80.7 kg)   BMI 27.06 kg/m   Body mass index is 27.06 kg/m.   General appearance: Well-developed well-nourished no gross deformities  Eyes clear  ENT:l, nasal passages clear, throat clear   Lymph nodes: No lymphadenopathy  Neck is supple without palpable mass, Cardiovascular mild bilateral edema Neurologically deep tendon reflexes are equal 0 out of 5 at the knee  And ankle   no sensation loss or deficits no pathologic reflexes  Psychological: Awake alert and oriented x3 mood and affect normal  Skin no lacerations or ulcerations no nodularity no palpable masses, no erythema or  nodularity  Musculoskeletal:   Back is nontender  Straight leg raises are negative   3:00 PM

## 2020-12-11 NOTE — Addendum Note (Signed)
Addended byCandice Camp on: 12/11/2020 03:23 PM   Modules accepted: Orders

## 2020-12-12 ENCOUNTER — Emergency Department (HOSPITAL_COMMUNITY): Payer: MEDICARE

## 2020-12-12 ENCOUNTER — Emergency Department (HOSPITAL_COMMUNITY)
Admission: EM | Admit: 2020-12-12 | Discharge: 2020-12-13 | Disposition: A | Discharge status: Home / Self Care | Payer: MEDICARE | Attending: Emergency Medicine | Admitting: Emergency Medicine

## 2020-12-12 ENCOUNTER — Other Ambulatory Visit: Payer: Self-pay

## 2020-12-12 ENCOUNTER — Encounter (HOSPITAL_COMMUNITY): Payer: Self-pay | Admitting: Emergency Medicine

## 2020-12-12 DIAGNOSIS — S62354A Nondisplaced fracture of shaft of fourth metacarpal bone, right hand, initial encounter for closed fracture: Secondary | ICD-10-CM | POA: Diagnosis not present

## 2020-12-12 DIAGNOSIS — N189 Chronic kidney disease, unspecified: Secondary | ICD-10-CM | POA: Diagnosis not present

## 2020-12-12 DIAGNOSIS — W1830XA Fall on same level, unspecified, initial encounter: Secondary | ICD-10-CM | POA: Diagnosis not present

## 2020-12-12 DIAGNOSIS — Z043 Encounter for examination and observation following other accident: Secondary | ICD-10-CM | POA: Diagnosis not present

## 2020-12-12 DIAGNOSIS — I82409 Acute embolism and thrombosis of unspecified deep veins of unspecified lower extremity: Secondary | ICD-10-CM | POA: Diagnosis not present

## 2020-12-12 DIAGNOSIS — Z87891 Personal history of nicotine dependence: Secondary | ICD-10-CM | POA: Insufficient documentation

## 2020-12-12 DIAGNOSIS — Y92019 Unspecified place in single-family (private) house as the place of occurrence of the external cause: Secondary | ICD-10-CM | POA: Insufficient documentation

## 2020-12-12 DIAGNOSIS — S0003XA Contusion of scalp, initial encounter: Secondary | ICD-10-CM | POA: Diagnosis not present

## 2020-12-12 DIAGNOSIS — I129 Hypertensive chronic kidney disease with stage 1 through stage 4 chronic kidney disease, or unspecified chronic kidney disease: Secondary | ICD-10-CM | POA: Diagnosis not present

## 2020-12-12 DIAGNOSIS — Z20822 Contact with and (suspected) exposure to covid-19: Secondary | ICD-10-CM | POA: Insufficient documentation

## 2020-12-12 DIAGNOSIS — R42 Dizziness and giddiness: Secondary | ICD-10-CM | POA: Diagnosis present

## 2020-12-12 DIAGNOSIS — S51011A Laceration without foreign body of right elbow, initial encounter: Secondary | ICD-10-CM | POA: Diagnosis not present

## 2020-12-12 DIAGNOSIS — Z79899 Other long term (current) drug therapy: Secondary | ICD-10-CM | POA: Insufficient documentation

## 2020-12-12 DIAGNOSIS — S62326A Displaced fracture of shaft of fifth metacarpal bone, right hand, initial encounter for closed fracture: Secondary | ICD-10-CM | POA: Insufficient documentation

## 2020-12-12 DIAGNOSIS — W19XXXA Unspecified fall, initial encounter: Secondary | ICD-10-CM | POA: Diagnosis not present

## 2020-12-12 DIAGNOSIS — S62614A Displaced fracture of proximal phalanx of right ring finger, initial encounter for closed fracture: Secondary | ICD-10-CM | POA: Diagnosis not present

## 2020-12-12 DIAGNOSIS — I10 Essential (primary) hypertension: Secondary | ICD-10-CM | POA: Diagnosis not present

## 2020-12-12 DIAGNOSIS — I2510 Atherosclerotic heart disease of native coronary artery without angina pectoris: Secondary | ICD-10-CM | POA: Insufficient documentation

## 2020-12-12 DIAGNOSIS — S62624A Displaced fracture of medial phalanx of right ring finger, initial encounter for closed fracture: Secondary | ICD-10-CM | POA: Diagnosis not present

## 2020-12-12 DIAGNOSIS — S63284A Dislocation of proximal interphalangeal joint of right ring finger, initial encounter: Secondary | ICD-10-CM | POA: Diagnosis not present

## 2020-12-12 DIAGNOSIS — M79641 Pain in right hand: Secondary | ICD-10-CM | POA: Diagnosis not present

## 2020-12-12 DIAGNOSIS — S51019A Laceration without foreign body of unspecified elbow, initial encounter: Secondary | ICD-10-CM

## 2020-12-12 DIAGNOSIS — M545 Low back pain, unspecified: Secondary | ICD-10-CM | POA: Diagnosis not present

## 2020-12-12 DIAGNOSIS — S51012A Laceration without foreign body of left elbow, initial encounter: Secondary | ICD-10-CM | POA: Diagnosis not present

## 2020-12-12 LAB — CBC WITH DIFFERENTIAL/PLATELET
Abs Immature Granulocytes: 0.02 10*3/uL (ref 0.00–0.07)
Basophils Absolute: 0 10*3/uL (ref 0.0–0.1)
Basophils Relative: 0 %
Eosinophils Absolute: 0.6 10*3/uL — ABNORMAL HIGH (ref 0.0–0.5)
Eosinophils Relative: 10 %
HCT: 30.4 % — ABNORMAL LOW (ref 39.0–52.0)
Hemoglobin: 9.3 g/dL — ABNORMAL LOW (ref 13.0–17.0)
Immature Granulocytes: 0 %
Lymphocytes Relative: 10 %
Lymphs Abs: 0.6 10*3/uL — ABNORMAL LOW (ref 0.7–4.0)
MCH: 32.3 pg (ref 26.0–34.0)
MCHC: 30.6 g/dL (ref 30.0–36.0)
MCV: 105.6 fL — ABNORMAL HIGH (ref 80.0–100.0)
Monocytes Absolute: 0.5 10*3/uL (ref 0.1–1.0)
Monocytes Relative: 8 %
Neutro Abs: 4.3 10*3/uL (ref 1.7–7.7)
Neutrophils Relative %: 72 %
Platelets: 115 10*3/uL — ABNORMAL LOW (ref 150–400)
RBC: 2.88 MIL/uL — ABNORMAL LOW (ref 4.22–5.81)
RDW: 15.6 % — ABNORMAL HIGH (ref 11.5–15.5)
WBC: 6.1 10*3/uL (ref 4.0–10.5)
nRBC: 0 % (ref 0.0–0.2)

## 2020-12-12 LAB — URINALYSIS, ROUTINE W REFLEX MICROSCOPIC
Bacteria, UA: NONE SEEN
Bilirubin Urine: NEGATIVE
Glucose, UA: NEGATIVE mg/dL
Hgb urine dipstick: NEGATIVE
Ketones, ur: NEGATIVE mg/dL
Leukocytes,Ua: NEGATIVE
Nitrite: NEGATIVE
Protein, ur: 100 mg/dL — AB
Specific Gravity, Urine: 1.012 (ref 1.005–1.030)
pH: 7 (ref 5.0–8.0)

## 2020-12-12 LAB — BASIC METABOLIC PANEL
Anion gap: 10 (ref 5–15)
BUN: 46 mg/dL — ABNORMAL HIGH (ref 8–23)
CO2: 22 mmol/L (ref 22–32)
Calcium: 8.8 mg/dL — ABNORMAL LOW (ref 8.9–10.3)
Chloride: 100 mmol/L (ref 98–111)
Creatinine, Ser: 4.8 mg/dL — ABNORMAL HIGH (ref 0.61–1.24)
GFR, Estimated: 11 mL/min — ABNORMAL LOW (ref >60)
Glucose, Bld: 116 mg/dL — ABNORMAL HIGH (ref 70–99)
Potassium: 4.6 mmol/L (ref 3.5–5.1)
Sodium: 132 mmol/L — ABNORMAL LOW (ref 135–145)

## 2020-12-12 LAB — TROPONIN I (HIGH SENSITIVITY)
Troponin I (High Sensitivity): 8 ng/L (ref <18)
Troponin I (High Sensitivity): 9 ng/L (ref <18)

## 2020-12-12 MED ORDER — PANTOPRAZOLE SODIUM 20 MG PO TBEC
20.0000 mg | DELAYED_RELEASE_TABLET | Freq: Every day | ORAL | Status: DC
  Filled 2020-12-12 (×3): qty 1

## 2020-12-12 MED ORDER — ROSUVASTATIN CALCIUM 10 MG PO TABS
10.0000 mg | ORAL_TABLET | Freq: Every day | ORAL | Status: DC
  Administered 2020-12-13: 10 mg via ORAL
  Filled 2020-12-12: qty 1

## 2020-12-12 MED ORDER — HYDROCODONE-ACETAMINOPHEN 5-325 MG PO TABS
1.0000 | ORAL_TABLET | Freq: Four times a day (QID) | ORAL | Status: DC | PRN
  Administered 2020-12-13: 1 via ORAL
  Filled 2020-12-12: qty 1

## 2020-12-12 MED ORDER — AMLODIPINE BESYLATE 5 MG PO TABS
10.0000 mg | ORAL_TABLET | Freq: Every day | ORAL | Status: DC
  Administered 2020-12-13: 10 mg via ORAL
  Filled 2020-12-12: qty 2

## 2020-12-12 MED ORDER — ARIPIPRAZOLE 10 MG PO TABS
20.0000 mg | ORAL_TABLET | Freq: Every day | ORAL | Status: DC
  Administered 2020-12-13: 20 mg via ORAL
  Filled 2020-12-12: qty 4
  Filled 2020-12-12 (×3): qty 2

## 2020-12-12 MED ORDER — LIDOCAINE HCL (PF) 2 % IJ SOLN
5.0000 mL | Freq: Once | INTRAMUSCULAR | Status: AC
  Administered 2020-12-12: 5 mL via INTRADERMAL

## 2020-12-12 MED ORDER — GABAPENTIN 100 MG PO CAPS
100.0000 mg | ORAL_CAPSULE | Freq: Every day | ORAL | Status: DC
  Administered 2020-12-12: 100 mg via ORAL
  Filled 2020-12-12: qty 1

## 2020-12-12 MED ORDER — HYDROCODONE-ACETAMINOPHEN 5-325 MG PO TABS
1.0000 | ORAL_TABLET | Freq: Once | ORAL | Status: AC
  Administered 2020-12-12: 1 via ORAL
  Filled 2020-12-12: qty 1

## 2020-12-12 MED ORDER — CARVEDILOL 3.125 MG PO TABS
6.2500 mg | ORAL_TABLET | Freq: Two times a day (BID) | ORAL | Status: DC
  Administered 2020-12-12 – 2020-12-13 (×2): 6.25 mg via ORAL
  Filled 2020-12-12 (×2): qty 2

## 2020-12-12 MED ORDER — SERTRALINE HCL 50 MG PO TABS
100.0000 mg | ORAL_TABLET | Freq: Every day | ORAL | Status: DC
  Administered 2020-12-13: 100 mg via ORAL
  Filled 2020-12-12: qty 2

## 2020-12-12 MED ORDER — LIDOCAINE HCL (PF) 2 % IJ SOLN
INTRAMUSCULAR | Status: AC
  Filled 2020-12-12: qty 10

## 2020-12-12 NOTE — TOC Initial Note (Signed)
Transition of Care [facility]) - Initial/Assessment Note    Patient Details  Name: [name] [name] MRN: [id number] Date of Birth: [birth year]  [facility] CM/SW Contact:    Iona Beard, [place] Phone Number: 12/12/2020, 6:12 PM  Clinical Narrative:                 Pt presents to ED due to fall. TOC consulted for HH/DME/SNF. CSW spoke with Kem Parkinson, PA who informed that pt uses a walker at baseline. Pt fell and has right hand fracture, now with cast to elbow. CSW spoke with pt to complete assessment and inquire about interest in SNF if recommended. Pt states that he lives alone but has a caregiver in the home 6 hours daily. Pt states he can complete ADLs but it makes it easier to have assistance. Pt has a cane and walker. At baseline pt ambulates with his walker. Pt states that he has been driving. Pt has had HH PT/RN but states he would not really be interested in starting that back up. CSW spoke with pt about PT coming to see him and interest in SNF placement. Pt states that he would work with PT and see what they recommend. CSW updated PA who has placed orders for PT to see pt. TOC to follow for possible SNF placement.   Expected Discharge Plan: Skilled Nursing Facility Barriers to Discharge: Continued Medical Work up (ED awaiting PT referral)   Patient Goals and CMS Choice Patient states their goals for this hospitalization and ongoing recovery are:: May go to SNF if PT recommends.   Choice offered to / list presented to : NA  Expected Discharge Plan and Services Expected Discharge Plan: [place] In-house Referral: Clinical Social Work Discharge Planning Services: CM Consult Post Acute Care Choice: [place] arrangements for the past 2 months: Single Family Home                 DME Arranged: N/A DME Agency: NA       HH Arranged: NA [place] Agency: NA        Prior Living Arrangements/Services Living arrangements for the past  2 months: Single Family Home Lives with:: Self Patient language and need for interpreter reviewed:: Yes Do you feel safe going back to the place where you live?: Yes      Need for Family Participation in Patient Care: No (Comment) Care giver support system in place?: Yes (comment) Current home services: [facility] Criminal Activity/Legal Involvement Pertinent to Current Situation/Hospitalization: No - Comment as needed  Activities of Daily Living      Permission Sought/Granted                  Emotional Assessment Appearance:: Appears stated age Attitude/Demeanor/Rapport: Engaged Affect (typically observed): Accepting Orientation: : Oriented to Place,Oriented to Self,Oriented to  Time,Oriented to Situation Alcohol / Substance Use: Not Applicable Psych Involvement: No (comment)  Admission diagnosis:  FALL Patient Active Problem List   Diagnosis Date Noted  . Sepsis due to Pseudomonas aeruginosa ([place]) 06/01/2020  . Hydronephrosis with infection 05/29/2020  . Hyperglycemia 05/29/2020  . Anemia in chronic kidney disease 10/21/2019  . Other disorders of lipoid metabolism 10/21/2019  . 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 Syncope 09/07/2018  . Acute lower UTI 09/07/2018  . ARF (acute renal failure) ([place]) 09/07/2018  . Gross hematuria 07/25/2018  . Sepsis due to urinary tract infection ([place]) 07/19/2018  . Paroxysmal A-fib ([place]) 07/19/2018  . Chronic kidney  disease, stage IV (severe) ([place]) 07/19/2018  . Hyperlipidemia 08/05/2014  . Knee effusion, right 04/21/2012  . Male erectile dysfunction 12/17/2011  . Male urinary stress incontinence 12/17/2011  . Essential hypertension 07/25/2011  . Anemia 08/27/2010  . Proteinuria 08/27/2010  . Personal history of prostate cancer 08/27/2010  . Urinary incontinence 08/27/2010  . CAD (coronary artery disease) 06/14/2010  . Carotid artery occlusion 06/14/2010  . CLOSED FRACTURE OF ACROMIAL END OF CLAVICLE 04/25/2010   PCP:  [name],  MD Pharmacy:   [facility], [address] [place] [address] Phone: [phone] Fax: [phone]     Social Determinants of Health (SDOH) Interventions    Readmission Risk Interventions Readmission Risk Prevention Plan 06/06/2020  Transportation Screening Complete  PCP or Specialist Appt within 3-5 Days Complete  HRI or [place] Complete  Social Work Consult for [place] Planning/Counseling Complete  Palliative Care Screening Not Applicable  Medication Review Press photographer) Complete  Some recent data might be hidden

## 2020-12-12 NOTE — ED Triage Notes (Signed)
Pt reports passing out after standing up from a lying position. Pt has skin tear to the left elbow and abrasions to the forehead and nose.

## 2020-12-12 NOTE — ED Provider Notes (Signed)
[facility] EMERGENCY DEPARTMENT Provider Note   CSN: [id number] Arrival date & time: 12/12/20  1118     History Chief Complaint  Patient presents with  . Fall    [name] [name] is a 87 y.o. male.  HPI      [name] [name] is a 87 y.o. male with past medical history of anemia, prostate cancer hypertension and chronic kidney disease who presents to the Emergency Department complaining of dizziness and fall that occurred around 10 AM this morning.  States that he fell striking his head on a carpeted floor.  Patient's caregiver found patient lying on the floor.  No reported loss of consciousness.  Patient denies headache, LOC, nausea or vomiting.  States that he has been feeling dizzy for several days and having ongoing pain of his lower back and bilateral legs.  States he was seen by his orthopedic provider yesterday for the back pain and was advised to take Tylenol.  He complains of pain and swelling of his right hand and a skin tear of his left elbow.  He denies nausea, vomiting, chest pain, shortness of breath, abdominal pain numbness or weakness of his lower extremities.  He states that he uses a walker or cane at baseline for ambulation.  He has a caregiver for 6 hours/day.     Past Medical History:  Diagnosis Date  . Anemia   . Anxiety   . 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   . Cancer ([place])    prostate ca 1999/removal/ rad tx  . Carotid artery occlusion   . Depression   . DVT (deep venous thrombosis) ([place])   . Gout   . History of kidney stones   . Hyperlipidemia   . Hypertension   . Incontinence   . Reflux   . Renal disorder   . Status post implantation of artificial urinary sphincter   . Wears dentures   . Wears glasses     Patient Active Problem List   Diagnosis Date Noted  . Sepsis due to Pseudomonas aeruginosa ([place]) 06/01/2020  . Hydronephrosis with infection 05/29/2020  .  Hyperglycemia 05/29/2020  . Anemia in chronic kidney disease 10/21/2019  . Other disorders of lipoid metabolism 10/21/2019  . 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 Syncope 09/07/2018  . Acute lower UTI 09/07/2018  . ARF (acute renal failure) ([place]) 09/07/2018  . Gross hematuria 07/25/2018  . Sepsis due to urinary tract infection ([place]) 07/19/2018  . Paroxysmal A-fib ([place]) 07/19/2018  . Chronic kidney disease, stage IV (severe) ([place]) 07/19/2018  . Hyperlipidemia 08/05/2014  . Knee effusion, right 04/21/2012  . Male erectile dysfunction 12/17/2011  . Male urinary stress incontinence 12/17/2011  . Essential hypertension 07/25/2011  . Anemia 08/27/2010  . Proteinuria 08/27/2010  . Personal history of prostate cancer 08/27/2010  . Urinary incontinence 08/27/2010  . CAD (coronary artery disease) 06/14/2010  . Carotid artery occlusion 06/14/2010  . CLOSED FRACTURE OF ACROMIAL END OF CLAVICLE 04/25/2010    Past Surgical History:  Procedure Laterality Date  . AV FISTULA PLACEMENT Left 05/15/2020   Procedure: LEFT FOREARM  ARTERIOVENOUS (AV) GRAFT INSERTION;  Surgeon: Angelia Mould, MD;  Location: [place];  Service: Vascular;  Laterality: Left;  . CARDIAC CATHETERIZATION     2000-2001  . cataracts    . CORONARY STENT PLACEMENT    . HERNIA REPAIR    . MULTIPLE TOOTH EXTRACTIONS    .  NEPHROSTOMY    . radical prostectomy    . URINARY SPHINCTER IMPLANT         Family History  Problem Relation Age of Onset  . Coronary artery disease Other        family history, male < 74  . Arthritis Other        family history  . Cancer Other   . Kidney disease Other     Social History   Tobacco Use  . Smoking status: Former Smoker    Packs/day: 2.00    Types: Cigarettes    Start date: 08/05/1962    Quit date: 12/02/1972    Years since quitting: 48.0  . Smokeless tobacco: Never Used  Vaping Use  . Vaping Use: Never used  Substance Use Topics  . Alcohol use: No  . Drug use: No    Home  Medications Prior to Admission medications   Medication Sig Start Date End Date Taking? Authorizing Provider  acetaminophen (TYLENOL) 500 MG tablet Take 1,000 mg by mouth every 8 (eight) hours as needed for mild pain or moderate pain. For headache pain    [provider]  ALFALFA PO Take 250 mg by mouth 2 (two) times daily.     [provider]  allopurinol (ZYLOPRIM) 100 MG tablet Take 100 mg by mouth daily.  10/07/18   [provider]  ALPRAZolam Duanne Moron) 0.5 MG tablet Take 1 tablet (0.5 mg total) by mouth 2 (two) times daily as needed for anxiety. 06/06/20   Roxan Hockey, MD  amLODipine (NORVASC) 10 MG tablet Take 10 mg by mouth daily. 03/27/20   [provider]  ARIPiprazole (ABILIFY) 20 MG tablet Take 1 tablet (20 mg total) by mouth daily. 06/06/20   Roxan Hockey, MD  carvedilol (COREG) 3.125 MG tablet Take 6.25 mg by mouth 2 (two) times daily. 04/25/20   [provider]  diphenhydrAMINE (BENADRYL) 2 % cream Apply topically. 07/07/20 07/07/21  [provider]  epoetin alfa (EPOGEN) 3000 UNIT/ML injection Inject 3,000 Units into the skin every 14 (fourteen) days.     [provider]  fish oil-omega-3 fatty acids 1000 MG capsule Take 1 g by mouth daily.    [provider]  furosemide (LASIX) 40 MG tablet Take 40 mg by mouth daily as needed. 03/27/20   [provider]  gabapentin (NEURONTIN) 100 MG capsule Take 1 capsule (100 mg total) by mouth at bedtime. 06/06/20   Roxan Hockey, MD  HYDROcodone-acetaminophen (NORCO) 5-325 MG tablet Take 1 tablet by mouth every 6 (six) hours as needed for moderate pain. 06/06/20   Roxan Hockey, MD  lansoprazole (PREVACID) 15 MG capsule Take 1 capsule (15 mg total) by mouth 2 (two) times daily before a meal. 06/06/20   Emokpae, Courage, MD  meclizine (ANTIVERT) 12.5 MG tablet Take 12.5 mg by mouth daily.    [provider]  Multiple Vitamin (MULTIVITAMIN WITH MINERALS) TABS  tablet Take 1 tablet by mouth daily.    [provider]  ofloxacin (FLOXIN) 0.3 % OTIC solution Place 5 drops into both ears daily.  10/19/18   [provider]  Probiotic Product (RISA-BID PROBIOTIC) TABS Give 1 tablet by mouth twice a day from 09/14/2018-09/24/2018    [provider]  rosuvastatin (CRESTOR) 10 MG tablet Take 10 mg by mouth daily.    [provider]  sertraline (ZOLOFT) 100 MG tablet Take 1 tablet (100 mg total) by mouth daily. 06/06/20   Roxan Hockey, MD  sodium  bicarbonate 650 MG tablet Take 1 tablet (650 mg total) by mouth 3 (three) times daily. 06/06/20   Roxan Hockey, MD  traMADol (ULTRAM) 50 MG tablet Take 1 tablet (50 mg total) by mouth every 6 (six) hours as needed. 05/15/20   Angelia Mould, MD    Allergies    Ivp dye [iodinated diagnostic agents], Nalbuphine, and Codeine  Review of Systems   Review of Systems  Constitutional: Negative for chills, fatigue and fever.  HENT: Negative for sore throat and trouble swallowing.   Eyes: Negative for visual disturbance.  Respiratory: Negative for shortness of breath and wheezing.   Cardiovascular: Negative for chest pain, palpitations and leg swelling.  Gastrointestinal: Negative for abdominal pain, diarrhea, nausea and vomiting.  Genitourinary: Negative for dysuria, flank pain and hematuria.  Musculoskeletal: Positive for arthralgias (right hand pain) and back pain. Negative for neck pain and neck stiffness.  Skin: Positive for wound (skin tear left elbow). Negative for rash.  Neurological: Positive for dizziness. Negative for weakness, numbness and headaches.  Hematological: Does not bruise/bleed easily.  Psychiatric/Behavioral: Negative for confusion.    Physical Exam Updated Vital Signs BP (!) 142/99   Pulse 97   Temp 98.3 F (36.8 C) (Oral)   Resp 14   Ht 5\' 8"  (1.727 m)   Wt 80.7 kg   SpO2 96%   BMI 27.05 kg/m   Physical Exam Vitals and nursing note  reviewed.  Constitutional:      General: He is not in acute distress.    Appearance: Normal appearance. He is not toxic-appearing.  HENT:     Head:     Comments: Hematoma left forehead with abrasion.  Mild abrasion of the nose.  No active bleeding.  No epistaxis Eyes:     Extraocular Movements: Extraocular movements intact.     Conjunctiva/sclera: Conjunctivae normal.     Pupils: Pupils are equal, round, and reactive to light.  Cardiovascular:     Rate and Rhythm: Normal rate and regular rhythm.     Pulses: Normal pulses.  Pulmonary:     Effort: Pulmonary effort is normal.     Breath sounds: Normal breath sounds.  Chest:     Chest wall: No tenderness.  Abdominal:     General: There is no distension.     Palpations: Abdomen is soft.     Tenderness: There is no abdominal tenderness.  Musculoskeletal:        General: Swelling, tenderness and signs of injury present.     Cervical back: Normal range of motion. No tenderness.     Comments: ttp of the right lateral hand with ecchymosis.  No open wound.  Obvious bony deformity noted to the right ring finger at the PIP joint.  Right wrist non tender.  c spine non tender.  Has FROM of the bilateral hip joints.    Skin:    General: Skin is warm.     Capillary Refill: Capillary refill takes less than 2 seconds.     Comments: Skin tear to lateral left elbow.  No active bleeding.    Neurological:     General: No focal deficit present.     Mental Status: He is alert.     GCS: GCS eye subscore is 4. GCS verbal subscore is 5. GCS motor subscore is 6.     Sensory: Sensation is intact.     Motor: Motor function is intact.     Coordination: Coordination is intact.     Comments: CN  III-XII intact.  Speech clear.  No facial droop, no pronator drift.        ED Results / Procedures / Treatments   Labs (all labs ordered are listed, but only abnormal results are displayed) Labs Reviewed  CBC WITH DIFFERENTIAL/PLATELET - Abnormal; Notable for the  following components:      Result Value   RBC 2.88 (*)    Hemoglobin 9.3 (*)    HCT 30.4 (*)    MCV 105.6 (*)    RDW 15.6 (*)    Platelets 115 (*)    Lymphs Abs 0.6 (*)    Eosinophils Absolute 0.6 (*)    All other components within normal limits  BASIC METABOLIC PANEL - Abnormal; Notable for the following components:   Sodium 132 (*)    Glucose, Bld 116 (*)    BUN 46 (*)    Creatinine, Ser 4.80 (*)    Calcium 8.8 (*)    GFR, Estimated 11 (*)    All other components within normal limits  URINALYSIS, ROUTINE W REFLEX MICROSCOPIC  TROPONIN I (HIGH SENSITIVITY)  TROPONIN I (HIGH SENSITIVITY)    EKG EKG Interpretation  Date/Time:  Tuesday December 12 2020 13:36:49 EST Ventricular Rate:  94 PR Interval:    QRS Duration: 91 QT Interval:  437 QTC Calculation: 547 R Axis:   14 Text Interpretation: Sinus rhythm Low voltage, precordial leads Consider anterior infarct Borderline T abnormalities, inferior leads Prolonged QT interval Confirmed by Milton Ferguson [phone]) on 12/12/2020 2:42:29 PM   Radiology DG Chest 1 View  Result Date: 12/12/2020 CLINICAL DATA:  87 year old male with dizziness and syncope. EXAM: CHEST  1 VIEW COMPARISON:  05/29/2020 FINDINGS: The heart size and mediastinal contours are within normal limits. Both lungs are clear. Atherosclerotic changes of the thoracic aorta. Unchanged appearance of chronic left distal clavicular fracture. No acute osseous abnormality. IMPRESSION: No acute cardiopulmonary process. Electronically Signed   By: Ruthann Cancer MD   On: 12/12/2020 14:28   DG Lumbar Spine Complete  Result Date: 12/12/2020 CLINICAL DATA:  Pain after fall. EXAM: LUMBAR SPINE - COMPLETE 4+ VIEW COMPARISON:  December 27, 2019. FINDINGS: Chronic superior endplate depression of L3 vertebral body is noted consistent with old fracture. No acute fracture or spondylolisthesis is noted. Disc spaces are well-maintained. Anterior osteophyte formation is noted at L2-3, L3-4 and  L4-5. IMPRESSION: Chronic superior endplate depression of L3 vertebral body consistent with old fracture. No acute abnormality seen in the lumbar spine. Aortic Atherosclerosis (ICD10-I70.0). Electronically Signed   By: Marijo Conception M.D.   On: 12/12/2020 14:29   DG Pelvis 1-2 Views  Result Date: 12/12/2020 CLINICAL DATA:  Status post fall. EXAM: PELVIS - 1-2 VIEW COMPARISON:  None. FINDINGS: There is no evidence of pelvic fracture or diastasis. No pelvic bone lesions are seen. IMPRESSION: Negative. Electronically Signed   By: Marijo Conception M.D.   On: 12/12/2020 14:30   CT Head Wo Contrast  Result Date: 12/12/2020 CLINICAL DATA:  Syncope with fall EXAM: CT HEAD WITHOUT CONTRAST TECHNIQUE: Contiguous axial images were obtained from the base of the skull through the vertex without intravenous contrast. COMPARISON:  September 07, 2018 FINDINGS: Brain: There is mild diffuse atrophy, stable. There is no intracranial mass, hemorrhage, extra-axial fluid collection, or midline shift. There is patchy small vessel disease in the centra semiovale bilaterally. No acute infarct evident. Vascular: No hyperdense vessel. There is calcification in each carotid siphon region. Skull: The bony calvarium appears intact. There is a  small left frontal scalp hematoma. Sinuses/Orbits: There is mucosal thickening in the inferior right maxillary antrum. There is mucosal thickening in several ethmoid air cells. Orbits appear symmetric bilaterally. Other: Mastoid air cells are clear. IMPRESSION: Atrophy with patchy periventricular small vessel disease, stable. No acute infarct evident. No mass or hemorrhage. There are foci of arterial vascular calcification. There is a small frontal scalp hematoma without underlying fracture. There are foci of paranasal sinus disease. Electronically Signed   By: Lowella Grip III M.D.   On: 12/12/2020 14:08   DG Hand Complete Right  Result Date: 12/12/2020 CLINICAL DATA:  Right hand pain after  fall. EXAM: RIGHT HAND - COMPLETE 3+ VIEW COMPARISON:  None. FINDINGS: Mildly displaced oblique fracture is seen involving the fifth metacarpal. Posterior dislocation of fourth middle phalanx relative to fourth proximal phalanx is noted. IMPRESSION: Mildly displaced fifth metacarpal fracture. Posterior dislocation of fourth middle phalanx relative to fourth proximal phalanx. Electronically Signed   By: Marijo Conception M.D.   On: 12/12/2020 14:32   DG Finger Ring Right  Result Date: 12/12/2020 CLINICAL DATA:  Fourth digit dislocation, postreduction. EXAM: RIGHT RING FINGER 2+V COMPARISON:  Hand radiograph earlier today. FINDINGS: The prior dorsal dislocation of the fourth digit at the proximal interphalangeal joint has been reduced. The alignment is anatomic. Suspected small displaced fracture from the proximal aspect of the middle phalanx with tiny fragment about the volar aspect of the joint. Fifth metacarpal fractures unchanged from hand exam earlier today. There is a nondisplaced fracture of the fourth metacarpal shaft, better appreciated on this smaller field of view. IMPRESSION: 1. Successful reduction of prior dorsal dislocation of the fourth digit at the proximal interphalangeal joint. Small displaced fracture from the proximal aspect of the middle phalanx is seen at the volar joint space. 2. Unchanged fifth metacarpal fracture. There is also a nondisplaced fracture of the fourth metacarpal shaft that was not well seen on prior hand radiograph. Electronically Signed   By: Keith Rake M.D.   On: 12/12/2020 15:42    Procedures .Ortho Injury Treatment  Date/Time: 12/12/2020 3:00 PM Performed by: Kem Parkinson, PA-C Authorized by: Kem Parkinson, PA-C   Consent:    Consent obtained:  Verbal   Consent given by:  Patient   Risks discussed:  Fracture and irreducible dislocationInjury location: finger Location details: right ring finger Injury type: dislocation Dislocation type:  PIP Pre-procedure neurovascular assessment: neurovascularly intact Pre-procedure distal perfusion: normal Pre-procedure neurological function: normal Pre-procedure range of motion: reduced Anesthesia: digital block  Anesthesia: Local anesthesia used: yes Local Anesthetic: lidocaine 2% without epinephrine Anesthetic total: 4 mL  Patient sedated: NoManipulation performed: yes Reduction successful: yes X-ray confirmed reduction: yes Immobilization: splint Splint type: static finger (Ulnar gutter splint for fifth metacarpal fracture.) Supplies used: aluminum splint,  cotton padding and Ortho-Glass Post-procedure neurovascular assessment: post-procedure neurovascularly intact Post-procedure distal perfusion: normal Post-procedure neurological function: normal Post-procedure range of motion: normal Patient tolerance: patient tolerated the procedure well with no immediate complications    (including critical care time)  Medications Ordered in ED Medications - No data to display  ED Course  I have reviewed the triage vital signs and the nursing notes.  Pertinent labs & imaging results that were available during my care of the patient were reviewed by me and considered in my medical decision making (see chart for details).   MDM Rules/Calculators/A&P  Patient here after a fall at home this morning.  Patient endorses some dizziness prior to his fall.  Was attempting to use a cane when the fall occurred.  Spoke with patient's caregiver endorses that patient has been taking muscle relaxers and feels that the muscle relaxers are causing his dizziness.    He is alert, no focal neuro deficits.  Pt denies nausea, vomiting or headache  Skin tear of the left elbow clean and bandaged.  Patient has bruising and swelling of the right hand and obvious bony deformity of the right ring finger.  Neurovascular intact.  X-ray of the hand shows a dislocation of the proximal  phalanx of the ring finger.  No open wound.  X-ray also shows a right fifth metacarpal fracture.   CT head, L-spine and pelvis without acute intracranial or acute bony findings.  EKG without acute ischemic changes.  Troponin reassuring.  Patient has chronic kidney disease and states that he sees Dr. Theador Hawthorne with nephrology.  Creatinine today is significantly elevated from baseline.  Will consult nephrology.  Successful reduction of dislocation finger.  Remains NV intact.  Finger splinted and ulnar gutter applied to right hand.  Recheck:  NV intact.  Pain improved  patient is not showing symptoms of uremia.    [facility], Dr Arty Baumgartner and discussed findings.  He felt that if pt is not showing sx's of uremia, that he will need close f/u with his nephrologist to begin dialysis    Iona Beard, LCSWA came to ED to speak with pt.  Pt agreeable to PT eval and possible SNF placement.  PT unavailable after hours.  Pt agreeable to stay here until am to see PT.  I have spoken with pt's caregiver and his son and provided update.  If PT recommendations are for SNF placement and patient agreeable, he will likely be discharged from here to SNF.  If not, patient will likely be discharged home.  Caregiver and son understand that he will need close outpatient follow-up with nephrology this week and that he will also need orthopedic follow-up.  Final Clinical Impression(s) / ED Diagnoses Final diagnoses:  Fall, initial encounter  Chronic kidney disease, unspecified CKD stage  Closed displaced fracture of shaft of fifth metacarpal bone of right hand, initial encounter  Closed nondisplaced fracture of shaft of fourth metacarpal bone of right hand, initial encounter  Skin tear of elbow without complication, initial encounter    Rx / DC Orders ED Discharge Orders    None       Bufford Lope 12/12/20 Rayford Halsted, MD 12/14/20 1504

## 2020-12-13 LAB — SARS CORONAVIRUS 2 (TAT 6-24 HRS): SARS Coronavirus 2: NEGATIVE

## 2020-12-13 MED ORDER — PANTOPRAZOLE SODIUM 40 MG PO TBEC
40.0000 mg | DELAYED_RELEASE_TABLET | Freq: Every day | ORAL | Status: DC
  Administered 2020-12-13: 40 mg via ORAL
  Filled 2020-12-13: qty 1

## 2020-12-13 NOTE — Plan of Care (Signed)
  Problem: Acute Rehab PT Goals(only PT should resolve) Goal: Pt Will Go Supine/Side To Sit Outcome: Progressing Flowsheets (Taken 12/13/2020 1622) Pt will go Supine/Side to Sit:  with minimal assist  with moderate assist Goal: Patient Will Transfer Sit To/From Stand Outcome: Progressing Flowsheets (Taken 12/13/2020 1622) Patient will transfer sit to/from stand:  with minimal assist  with moderate assist Goal: Pt Will Transfer Bed To Chair/Chair To Bed Outcome: Progressing Flowsheets (Taken 12/13/2020 1622) Pt will Transfer Bed to Chair/Chair to Bed:  with mod assist  with min assist Goal: Pt Will Ambulate Outcome: Progressing Flowsheets (Taken 12/13/2020 1622) Pt will Ambulate:  25 feet  with moderate assist  with minimal assist Note: Hemi-walker, platform walker   4:23 PM, 12/13/20 Lonell Grandchild, MPT Physical Therapist with [facility] [phone] office [phone] mobile phone

## 2020-12-13 NOTE — ED Provider Notes (Signed)
Patient signed out to me is pending PT evaluation.  PT recommended nursing home.  Have the patient is adamant he does not want to go.  I spoke with the patient's son and their caregivers they all feel confident they can help the patient take care of himself at home.  They are declining admission declining nursing home transfer or nursing home stay.  Patient discharged home in stable condition.   Luna Fuse, MD 12/13/20 [phone]

## 2020-12-13 NOTE — Evaluation (Signed)
Physical Therapy Evaluation Patient Details Name: [name] [name] MRN: [id number] DOB: June 06, 1934 Today's Date: 12/13/2020   History of Present Illness  [name] [name] is a 87 y.o. male with past medical history of anemia, prostate cancer hypertension and chronic kidney disease who presents to the Emergency Department complaining of dizziness and fall that occurred around 10 AM this morning.  States that he fell striking his head on a carpeted floor.  Patient's caregiver found patient lying on the floor.  No reported loss of consciousness.  Patient denies headache, LOC, nausea or vomiting.  States that he has been feeling dizzy for several days and having ongoing pain of his lower back and bilateral legs.  States he was seen by his orthopedic provider yesterday for the back pain and was advised to take Tylenol.  He complains of pain and swelling of his right hand and a skin tear of his left elbow.  He denies nausea, vomiting, chest pain, shortness of breath, abdominal pain numbness or weakness of his lower extremities.  He states that he uses a walker or cane at baseline for ambulation.  He has a caregiver for 6 hours/day.    Clinical Impression  Patient presents with cast to RUE, demonstrates slow labored movement for sitting up at bedside with limited use of RUE, at high risk for falls, unsteady on feet and limited to a few steps at bedside before having to sit secondary to c/o BLE pain and weakness.  Patient required RUE supported by helper and able to hold onto RW with left hand when taking steps.  Patient put back to bed with Mod/max assist to reposition.  Patient will benefit from continued physical therapy in hospital and recommended venue below to increase strength, balance, endurance for safe ADLs and gait.    Follow Up Recommendations SNF    Equipment Recommendations  None recommended by PT    Recommendations for Other Services       Precautions / Restrictions  Precautions Precautions: Fall Restrictions Weight Bearing Restrictions: No      Mobility  Bed Mobility Overal bed mobility: Needs Assistance Bed Mobility: Supine to Sit;Sit to Supine     Supine to sit: Mod assist Sit to supine: Mod assist   General bed mobility comments: slow labored movement, unable to use RUE due to cast    Transfers Overall transfer level: Needs assistance Equipment used: Rolling walker (2 wheeled);1 person hand held assist Transfers: Sit to/from [facility] Sit to Stand: Mod assist;Min assist Stand pivot transfers: Mod assist       General transfer comment: had to support RUE due to casted, able to support self using LUE on RW  Ambulation/Gait Ambulation/Gait assistance: Mod assist;Max assist Gait Distance (Feet): 5 Feet Assistive device: Rolling walker (2 wheeled);1 person hand held assist Gait Pattern/deviations: Decreased step length - left;Decreased stance time - right;Decreased stride length Gait velocity: decreased   General Gait Details: limited to 5-6 steps at bedside mostly due to c/o chronic BLE pain, mostly in hips, supported RUE due to cast, patient leaned on RW with left hand  Stairs            Wheelchair Mobility    Modified Rankin (Stroke Patients Only)       Balance Overall balance assessment: Needs assistance Sitting-balance support: Feet supported;Single extremity supported Sitting balance-Leahy Scale: Fair Sitting balance - Comments: seated at EOB   Standing balance support: During functional activity;Bilateral upper extremity supported Standing balance-Leahy Scale: Poor Standing balance comment:  fair/poor using left hand on RW and RUE supported by helper                             Pertinent Vitals/Pain Pain Assessment: 0-10 Pain Score: 6  Pain Location: RUE Pain Descriptors / Indicators: Sore Pain Intervention(s): Limited activity within patient's tolerance;Monitored during  session;Repositioned    Home Living Family/patient expects to be discharged to:: Private residence Living Arrangements: Alone Available Help at Discharge: Personal care attendant;Family;Available PRN/intermittently Type of Home: House Home Access: Stairs to enter Entrance Stairs-Rails: None Entrance Stairs-Number of Steps: 1-2 Home Layout: Two level;Able to live on main level with bedroom/bathroom Home Equipment: Gilford Rile - 2 wheels;Cane - single point;Bedside commode      Prior Function Level of Independence: Needs assistance   Gait / Transfers Assistance Needed: household ambulator using RW  ADL's / Homemaking Assistance Needed: home aides 6 hours/day x 7 days/week        Hand Dominance        Extremity/Trunk Assessment   Upper Extremity Assessment Upper Extremity Assessment: RUE deficits/detail;LUE deficits/detail RUE Deficits / Details: grossly -3/5, except wrist/hand not tested RUE: Unable to fully assess due to immobilization;Unable to fully assess due to pain RUE Sensation: WNL RUE Coordination: WNL LUE Deficits / Details: grossly 4+/5 LUE Sensation: WNL LUE Coordination: WNL    Lower Extremity Assessment Lower Extremity Assessment: Generalized weakness    Cervical / Trunk Assessment Cervical / Trunk Assessment: Normal  Communication   Communication: No difficulties  Cognition Arousal/Alertness: Awake/alert Behavior During Therapy: WFL for tasks assessed/performed Overall Cognitive Status: Within Functional Limits for tasks assessed                                        General Comments      Exercises     Assessment/Plan    PT Assessment Patient needs continued PT services  PT Problem List Decreased strength;Decreased activity tolerance;Decreased balance;Decreased mobility;Decreased range of motion;Decreased knowledge of use of DME       PT Treatment Interventions DME instruction;Gait training;Stair training;Functional mobility  training;Therapeutic activities;Therapeutic exercise;Balance training;Patient/family education    PT Goals (Current goals can be found in the Care Plan section)  Acute Rehab PT Goals Patient Stated Goal: return home after rehab PT Goal Formulation: With patient Time For Goal Achievement: 12/27/20 Potential to Achieve Goals: Good    Frequency Min 3X/week   Barriers to discharge        Co-evaluation               AM-PAC PT "6 Clicks" Mobility  Outcome Measure Help needed turning from your back to your side while in a flat bed without using bedrails?: A Lot Help needed moving from lying on your back to sitting on the side of a flat bed without using bedrails?: A Lot Help needed moving to and from a bed to a chair (including a wheelchair)?: A Lot Help needed standing up from a chair using your arms (e.g., wheelchair or bedside chair)?: A Lot Help needed to walk in hospital room?: A Lot Help needed climbing 3-5 steps with a railing? : Total 6 Click Score: 11    End of Session   Activity Tolerance: Patient tolerated treatment well;Patient limited by fatigue;Patient limited by pain Patient left: in bed;with call bell/phone within reach Nurse Communication: Mobility status PT Visit Diagnosis:  Unsteadiness on feet (R26.81);Other abnormalities of gait and mobility (R26.89);Muscle weakness (generalized) (M62.81);History of falling (Z91.81)    Time: 7573-2256 PT Time Calculation (min) (ACUTE ONLY): 26 min   Charges:   PT Evaluation $PT Eval Moderate Complexity: 1 Mod PT Treatments $Therapeutic Activity: 23-37 mins        4:20 PM, 12/13/20 Lonell Grandchild, MPT Physical Therapist with [facility] 336 [phone] office [phone] mobile phone

## 2020-12-14 ENCOUNTER — Encounter (HOSPITAL_COMMUNITY)
Admission: RE | Admit: 2020-12-14 | Discharge: 2020-12-14 | Disposition: A | Discharge status: Home / Self Care | Payer: Medicare Other | Source: Ambulatory Visit | Attending: Nephrology | Admitting: Nephrology

## 2020-12-14 ENCOUNTER — Other Ambulatory Visit: Payer: Self-pay

## 2020-12-14 ENCOUNTER — Telehealth: Payer: Self-pay | Admitting: Orthopedic Surgery

## 2020-12-14 DIAGNOSIS — D631 Anemia in chronic kidney disease: Secondary | ICD-10-CM | POA: Diagnosis not present

## 2020-12-14 DIAGNOSIS — N184 Chronic kidney disease, stage 4 (severe): Secondary | ICD-10-CM | POA: Diagnosis not present

## 2020-12-14 MED ORDER — EPOETIN ALFA 3000 UNIT/ML IJ SOLN
3000.0000 [IU] | Freq: Once | INTRAMUSCULAR | Status: AC
  Administered 2020-12-14: 3000 [IU] via SUBCUTANEOUS

## 2020-12-14 MED ORDER — EPOETIN ALFA 3000 UNIT/ML IJ SOLN
INTRAMUSCULAR | Status: AC
  Filled 2020-12-14: qty 1

## 2020-12-14 NOTE — Telephone Encounter (Signed)
Call received from [facility], [facility] at [facility], called to let Dr Aline Brochure know that patient had been re-admitted to hospital, then discharged yesterday; said that is the reason for the slight delay. States they will be out to see him tomorrow, 1/14.22. Ph [phone] if any questions.

## 2020-12-15 DIAGNOSIS — K219 Gastro-esophageal reflux disease without esophagitis: Secondary | ICD-10-CM | POA: Diagnosis not present

## 2020-12-15 DIAGNOSIS — N184 Chronic kidney disease, stage 4 (severe): Secondary | ICD-10-CM | POA: Diagnosis not present

## 2020-12-15 DIAGNOSIS — M109 Gout, unspecified: Secondary | ICD-10-CM | POA: Diagnosis not present

## 2020-12-15 DIAGNOSIS — I6529 Occlusion and stenosis of unspecified carotid artery: Secondary | ICD-10-CM | POA: Diagnosis not present

## 2020-12-15 DIAGNOSIS — S63284D Dislocation of proximal interphalangeal joint of right ring finger, subsequent encounter: Secondary | ICD-10-CM | POA: Diagnosis not present

## 2020-12-15 DIAGNOSIS — S51012D Laceration without foreign body of left elbow, subsequent encounter: Secondary | ICD-10-CM | POA: Diagnosis not present

## 2020-12-15 DIAGNOSIS — M4856XD Collapsed vertebra, not elsewhere classified, lumbar region, subsequent encounter for fracture with routine healing: Secondary | ICD-10-CM | POA: Diagnosis not present

## 2020-12-15 DIAGNOSIS — M47816 Spondylosis without myelopathy or radiculopathy, lumbar region: Secondary | ICD-10-CM | POA: Diagnosis not present

## 2020-12-15 DIAGNOSIS — F419 Anxiety disorder, unspecified: Secondary | ICD-10-CM | POA: Diagnosis not present

## 2020-12-15 DIAGNOSIS — I129 Hypertensive chronic kidney disease with stage 1 through stage 4 chronic kidney disease, or unspecified chronic kidney disease: Secondary | ICD-10-CM | POA: Diagnosis not present

## 2020-12-15 DIAGNOSIS — N1330 Unspecified hydronephrosis: Secondary | ICD-10-CM | POA: Diagnosis not present

## 2020-12-15 DIAGNOSIS — M48062 Spinal stenosis, lumbar region with neurogenic claudication: Secondary | ICD-10-CM | POA: Diagnosis not present

## 2020-12-15 DIAGNOSIS — S62326D Displaced fracture of shaft of fifth metacarpal bone, right hand, subsequent encounter for fracture with routine healing: Secondary | ICD-10-CM | POA: Diagnosis not present

## 2020-12-15 DIAGNOSIS — N393 Stress incontinence (female) (male): Secondary | ICD-10-CM | POA: Diagnosis not present

## 2020-12-15 DIAGNOSIS — S62354D Nondisplaced fracture of shaft of fourth metacarpal bone, right hand, subsequent encounter for fracture with routine healing: Secondary | ICD-10-CM | POA: Diagnosis not present

## 2020-12-15 DIAGNOSIS — I480 Paroxysmal atrial fibrillation: Secondary | ICD-10-CM | POA: Diagnosis not present

## 2020-12-15 DIAGNOSIS — S51812D Laceration without foreign body of left forearm, subsequent encounter: Secondary | ICD-10-CM | POA: Diagnosis not present

## 2020-12-15 DIAGNOSIS — G4700 Insomnia, unspecified: Secondary | ICD-10-CM | POA: Diagnosis not present

## 2020-12-15 DIAGNOSIS — I2510 Atherosclerotic heart disease of native coronary artery without angina pectoris: Secondary | ICD-10-CM | POA: Diagnosis not present

## 2020-12-15 DIAGNOSIS — D631 Anemia in chronic kidney disease: Secondary | ICD-10-CM | POA: Diagnosis not present

## 2020-12-15 DIAGNOSIS — S62622D Displaced fracture of medial phalanx of right middle finger, subsequent encounter for fracture with routine healing: Secondary | ICD-10-CM | POA: Diagnosis not present

## 2020-12-15 DIAGNOSIS — Z8546 Personal history of malignant neoplasm of prostate: Secondary | ICD-10-CM | POA: Diagnosis not present

## 2020-12-15 DIAGNOSIS — S0121XD Laceration without foreign body of nose, subsequent encounter: Secondary | ICD-10-CM | POA: Diagnosis not present

## 2020-12-15 DIAGNOSIS — E785 Hyperlipidemia, unspecified: Secondary | ICD-10-CM | POA: Diagnosis not present

## 2020-12-15 DIAGNOSIS — S0181XD Laceration without foreign body of other part of head, subsequent encounter: Secondary | ICD-10-CM | POA: Diagnosis not present

## 2020-12-19 ENCOUNTER — Telehealth: Payer: Self-pay | Admitting: Orthopedic Surgery

## 2020-12-19 NOTE — Telephone Encounter (Signed)
Patient requests something for pain, possibly Tramadol.  He uses [facility] in [place]

## 2020-12-19 NOTE — Telephone Encounter (Signed)
As noted when he was here   Call primary care doctor we r not starting that

## 2020-12-19 NOTE — Telephone Encounter (Signed)
I called him to advise, told him PCP or go to pain clinic Dr Aline Brochure referred him.

## 2020-12-20 DIAGNOSIS — N185 Chronic kidney disease, stage 5: Secondary | ICD-10-CM | POA: Diagnosis not present

## 2020-12-20 DIAGNOSIS — D631 Anemia in chronic kidney disease: Secondary | ICD-10-CM | POA: Diagnosis not present

## 2020-12-20 DIAGNOSIS — N189 Chronic kidney disease, unspecified: Secondary | ICD-10-CM | POA: Diagnosis not present

## 2020-12-20 DIAGNOSIS — I129 Hypertensive chronic kidney disease with stage 1 through stage 4 chronic kidney disease, or unspecified chronic kidney disease: Secondary | ICD-10-CM | POA: Diagnosis not present

## 2020-12-20 DIAGNOSIS — E211 Secondary hyperparathyroidism, not elsewhere classified: Secondary | ICD-10-CM | POA: Diagnosis not present

## 2020-12-20 DIAGNOSIS — E871 Hypo-osmolality and hyponatremia: Secondary | ICD-10-CM | POA: Diagnosis not present

## 2020-12-25 ENCOUNTER — Encounter: Payer: Self-pay | Admitting: Orthopedic Surgery

## 2020-12-25 ENCOUNTER — Ambulatory Visit (INDEPENDENT_AMBULATORY_CARE_PROVIDER_SITE_OTHER): Payer: MEDICARE | Admitting: Orthopedic Surgery

## 2020-12-25 ENCOUNTER — Other Ambulatory Visit: Payer: Self-pay

## 2020-12-25 VITALS — BP 138/63 | HR 99 | Ht 68.0 in | Wt 177.0 lb

## 2020-12-25 DIAGNOSIS — S63289A Dislocation of proximal interphalangeal joint of unspecified finger, initial encounter: Secondary | ICD-10-CM | POA: Diagnosis not present

## 2020-12-25 DIAGNOSIS — S62356A Nondisplaced fracture of shaft of fifth metacarpal bone, right hand, initial encounter for closed fracture: Secondary | ICD-10-CM

## 2020-12-25 NOTE — Patient Instructions (Signed)
Wear brace for 6 weeks can remove for bathing and sleeping  Return in 6 weeks for x-ray right hand and finger

## 2020-12-25 NOTE — Progress Notes (Signed)
NEW PROBLEM//OFFICE VISIT  Summary assessment and plan:  87 year old male with a fractured fifth metacarpal nondisplaced and a ring finger PIP joint dislocation which was reduced and stable  Active range of motion of the right ring finger  Removable splint for right fifth metacarpal fracture  Follow-up 6 weeks x-ray both hand and finger separately   Chief Complaint  Patient presents with  . Fracture    Rt hand ring finger DOI 12/12/20    87 year old male fell injured his right hand sustained a 5 metacarpal fracture and PIP joint dislocation ring finger which was reduced presents for follow-up complaining of pain in each area   Review of Systems  Musculoskeletal: Positive for falls.  Neurological: Positive for weakness.     Past Medical History:  Diagnosis Date  . Anemia   . Anxiety   . 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   . Cancer ([place])    prostate ca 1999/removal/ rad tx  . Carotid artery occlusion   . Depression   . DVT (deep venous thrombosis) ([place])   . Gout   . History of kidney stones   . Hyperlipidemia   . Hypertension   . Incontinence   . Reflux   . Renal disorder   . Status post implantation of artificial urinary sphincter   . Wears dentures   . Wears glasses     Past Surgical History:  Procedure Laterality Date  . AV FISTULA PLACEMENT Left 05/15/2020   Procedure: LEFT FOREARM  ARTERIOVENOUS (AV) GRAFT INSERTION;  Surgeon: Angelia Mould, MD;  Location: [place];  Service: Vascular;  Laterality: Left;  . CARDIAC CATHETERIZATION     2000-2001  . cataracts    . CORONARY STENT PLACEMENT    . HERNIA REPAIR    . MULTIPLE TOOTH EXTRACTIONS    . NEPHROSTOMY    . radical prostectomy    . URINARY SPHINCTER IMPLANT      Family History  Problem Relation Age of Onset  . Coronary artery disease Other        family history, male < 54  . Arthritis Other        family history   . Cancer Other   . Kidney disease Other    Social History   Tobacco Use  . Smoking status: Former Smoker    Packs/day: 2.00    Types: Cigarettes    Start date: 08/05/1962    Quit date: 12/02/1972    Years since quitting: 48.0  . Smokeless tobacco: Never Used  Vaping Use  . Vaping Use: Never used  Substance Use Topics  . Alcohol use: No  . Drug use: No    Allergies  Allergen Reactions  . Ivp Dye [Iodinated Diagnostic Agents]     Due to partial renal failure  . Nalbuphine Other (See Comments)    PATIENT STATES IT MADE HIM FEEL LIKE HE WAS GOING TO DIE. NO SPECIFICS  . Codeine Anxiety    Current Meds  Medication Sig  . acetaminophen (TYLENOL) 500 MG tablet Take 1,000 mg by mouth every 8 (eight) hours as needed for mild pain or moderate pain. For headache pain  . ALFALFA PO Take 250 mg by mouth 2 (two) times daily.   Marland Kitchen allopurinol (ZYLOPRIM) 100 MG tablet Take 100 mg by mouth daily.   Marland Kitchen ALPRAZolam (XANAX) 0.5 MG tablet Take 1 tablet (0.5 mg total) by mouth 2 (two) times  daily as needed for anxiety.  Marland Kitchen amLODipine (NORVASC) 10 MG tablet Take 10 mg by mouth daily.  . ARIPiprazole (ABILIFY) 20 MG tablet Take 1 tablet (20 mg total) by mouth daily.  . carvedilol (COREG) 3.125 MG tablet Take 6.25 mg by mouth 2 (two) times daily.  Marland Kitchen epoetin alfa (EPOGEN) 3000 UNIT/ML injection Inject 3,000 Units into the skin every 14 (fourteen) days.   . fish oil-omega-3 fatty acids 1000 MG capsule Take 1 g by mouth daily.  . furosemide (LASIX) 40 MG tablet Take 40 mg by mouth daily as needed.  . lansoprazole (PREVACID) 15 MG capsule Take 1 capsule (15 mg total) by mouth 2 (two) times daily before a meal.  . Multiple Vitamin (MULTIVITAMIN WITH MINERALS) TABS tablet Take 1 tablet by mouth daily.  . Probiotic Product (RISA-BID PROBIOTIC) TABS Give 1 tablet by mouth twice a day from 09/14/2018-09/24/2018  . rosuvastatin (CRESTOR) 10 MG tablet Take 10 mg by mouth daily.  . sertraline (ZOLOFT) 100 MG  tablet Take 1 tablet (100 mg total) by mouth daily.  . sodium bicarbonate 650 MG tablet Take 1 tablet (650 mg total) by mouth 3 (three) times daily.  Marland Kitchen sulfamethoxazole-trimethoprim (BACTRIM DS) 800-160 MG tablet Take 0.5 tablets by mouth 2 (two) times daily. With meals  . traMADol (ULTRAM) 50 MG tablet Take 1 tablet (50 mg total) by mouth every 6 (six) hours as needed.    BP 138/63   Pulse 99   Ht 5\' 8"  (1.727 m)   Wt 177 lb (80.3 kg)   BMI 26.91 kg/m   Physical Exam Constitutional:      General: He is not in acute distress.    Appearance: Normal appearance.  Skin:    General: Skin is warm.     Capillary Refill: Capillary refill takes less than 2 seconds.     Findings: Bruising present.  Neurological:     General: No focal deficit present.     Mental Status: He is alert.     Sensory: No sensory deficit.     Motor: Weakness present.     Gait: Gait abnormal.     Deep Tendon Reflexes: Reflexes normal.  Psychiatric:        Mood and Affect: Mood normal.        Behavior: Behavior normal.     Ortho Exam  The PIP joint of the right ring finger is stable he does have some swelling around it and there is tenderness, he has normal passive range of motion decreased active range of motion  Flexor tendons intact  Color capillary refill normal  Fifth metacarpal is tender there is a large bruise over the right hand skin is intact   MEDICAL DECISION MAKING  A.  Encounter Diagnoses  Name Primary?  . Closed nondisplaced fracture of shaft of fifth metacarpal bone of right hand, initial encounter Yes  . Dislocation of proximal interphalangeal joint of finger, initial encounter right hand     B. DATA ANALYSED:   IMAGING: Interpretation of images: Outside images [facility] dated December 12, 2020 3 views of the right hand  Image shows 1: Fifth metacarpal fracture nondisplaced  Image shows to: Dislocation ring finger PIP joint  Second set of images show reduction  of the PIP joint of the ring finger with congruent reduction  Outside records: ER record, looks like the patient had a PT evaluation while he was in the ER.  He has been having dizziness.  We saw him  for spinal stenosis and recommended Tylenol to avoid any opioid complications.  C. MANAGEMENT   Recommend nonoperative management buddy taping active range of motion of the PIP joint of the right ring finger and splint immobilization for the metacarpal fracture  X-ray 4 weeks  No orders of the defined types were placed in this encounter.   Acute injury uncomplicated to outside images were read risk of morbidity is low  Arther Abbott, MD  12/25/2020 10:34 AM

## 2020-12-28 ENCOUNTER — Encounter (HOSPITAL_COMMUNITY): Admission: RE | Admit: 2020-12-28 | Payer: Medicare Other | Source: Ambulatory Visit

## 2020-12-28 ENCOUNTER — Other Ambulatory Visit: Payer: Self-pay

## 2020-12-28 ENCOUNTER — Encounter (HOSPITAL_COMMUNITY)
Admission: RE | Admit: 2020-12-28 | Discharge: 2020-12-28 | Disposition: A | Discharge status: Home / Self Care | Payer: Medicare Other | Source: Ambulatory Visit | Attending: Nephrology | Admitting: Nephrology

## 2020-12-28 ENCOUNTER — Encounter (HOSPITAL_COMMUNITY): Payer: Self-pay

## 2020-12-28 DIAGNOSIS — D631 Anemia in chronic kidney disease: Secondary | ICD-10-CM | POA: Diagnosis not present

## 2020-12-28 LAB — POCT HEMOGLOBIN-HEMACUE: Hemoglobin: 8.7 g/dL — ABNORMAL LOW (ref 13.0–17.0)

## 2020-12-28 MED ORDER — EPOETIN ALFA 3000 UNIT/ML IJ SOLN
INTRAMUSCULAR | Status: AC
  Filled 2020-12-28: qty 1

## 2020-12-28 MED ORDER — EPOETIN ALFA 3000 UNIT/ML IJ SOLN
3000.0000 [IU] | Freq: Once | INTRAMUSCULAR | Status: AC
  Administered 2020-12-28: 3000 [IU] via SUBCUTANEOUS

## 2021-01-10 ENCOUNTER — Ambulatory Visit (INDEPENDENT_AMBULATORY_CARE_PROVIDER_SITE_OTHER): Payer: MEDICARE | Admitting: Urology

## 2021-01-10 ENCOUNTER — Other Ambulatory Visit: Payer: Self-pay

## 2021-01-10 ENCOUNTER — Encounter: Payer: Self-pay | Admitting: Urology

## 2021-01-10 VITALS — BP 134/66 | HR 80 | Temp 98.9°F | Ht 68.0 in | Wt 177.0 lb

## 2021-01-10 DIAGNOSIS — N3281 Overactive bladder: Secondary | ICD-10-CM | POA: Diagnosis not present

## 2021-01-10 DIAGNOSIS — R310 Gross hematuria: Secondary | ICD-10-CM | POA: Diagnosis not present

## 2021-01-10 DIAGNOSIS — C61 Malignant neoplasm of prostate: Secondary | ICD-10-CM

## 2021-01-10 LAB — BLADDER SCAN AMB NON-IMAGING: Scan Result: 0

## 2021-01-10 MED ORDER — CIPROFLOXACIN HCL 500 MG PO TABS: 500.0000 mg | ORAL_TABLET | Freq: Once | ORAL | Status: DC

## 2021-01-10 NOTE — Progress Notes (Signed)
01/10/2021 2:15 PM   [name] [name] 1934-03-03 [id number]  Referring provider: [name], MD [address],  [place] [address]  Followup gross hematuria  HPI: [name] is a 87yo here for followup for gross hematuria. No hematuria episodes since last visit. He was treated for a UTI which resolved the hematuria. He continues to have bothersome urinary urgency and multiple daily episodes of urge incontinence. Urinary stream is strong, no hesitancy, no straining to urinate. NO dysuria.    PMH: Past Medical History:  Diagnosis Date  . Anemia   . Anxiety   . 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   . Cancer ([place])    prostate ca 1999/removal/ rad tx  . Carotid artery occlusion   . Depression   . DVT (deep venous thrombosis) ([place])   . Gout   . History of kidney stones   . Hyperlipidemia   . Hypertension   . Incontinence   . Reflux   . Renal disorder   . Status post implantation of artificial urinary sphincter   . Wears dentures   . Wears glasses     Surgical History: Past Surgical History:  Procedure Laterality Date  . AV FISTULA PLACEMENT Left 05/15/2020   Procedure: LEFT FOREARM  ARTERIOVENOUS (AV) GRAFT INSERTION;  Surgeon: Angelia Mould, MD;  Location: [facility];  Service: Vascular;  Laterality: Left;  . CARDIAC CATHETERIZATION     2000-2001  . cataracts    . CORONARY STENT PLACEMENT    . HERNIA REPAIR    . MULTIPLE TOOTH EXTRACTIONS    . NEPHROSTOMY    . radical prostectomy    . URINARY SPHINCTER IMPLANT      Home Medications:  Allergies as of 01/10/2021      Reactions   Ivp Dye [iodinated Diagnostic Agents]    Due to partial renal failure   Nalbuphine Other (See Comments)   PATIENT STATES IT MADE HIM FEEL LIKE HE WAS GOING TO DIE. NO SPECIFICS   Codeine Anxiety      Medication List       Accurate as of January 10, 2021  2:15 PM. If you have any questions, ask  your nurse or doctor.        acetaminophen 500 MG tablet Commonly known as: TYLENOL Take 1,000 mg by mouth every 8 (eight) hours as needed for mild pain or moderate pain. For headache pain   ALFALFA PO Take 250 mg by mouth 2 (two) times daily.   allopurinol 100 MG tablet Commonly known as: ZYLOPRIM Take 100 mg by mouth daily.   ALPRAZolam 0.5 MG tablet Commonly known as: XANAX Take 1 tablet (0.5 mg total) by mouth 2 (two) times daily as needed for anxiety.   amLODipine 10 MG tablet Commonly known as: NORVASC Take 10 mg by mouth daily.   ARIPiprazole 20 MG tablet Commonly known as: ABILIFY Take 1 tablet (20 mg total) by mouth daily.   carvedilol 3.125 MG tablet Commonly known as: COREG Take 6.25 mg by mouth 2 (two) times daily.   diphenhydrAMINE 2 % cream Commonly known as: BENADRYL Apply topically.   epoetin alfa 3000 UNIT/ML injection Commonly known as: EPOGEN Inject 3,000 Units into the skin every 14 (fourteen) days.   fish oil-omega-3 fatty acids 1000 MG capsule Take 1 g by mouth daily.   furosemide 40 MG tablet Commonly known as: LASIX Take 40  mg by mouth daily as needed.   gabapentin 100 MG capsule Commonly known as: NEURONTIN Take 1 capsule (100 mg total) by mouth at bedtime.   HYDROcodone-acetaminophen 5-325 MG tablet Commonly known as: Norco Take 1 tablet by mouth every 6 (six) hours as needed for moderate pain.   lansoprazole 15 MG capsule Commonly known as: PREVACID Take 1 capsule (15 mg total) by mouth 2 (two) times daily before a meal.   multivitamin with minerals Tabs tablet Take 1 tablet by mouth daily.   ofloxacin 0.3 % OTIC solution Commonly known as: FLOXIN Place 5 drops into both ears daily.   Risa-Bid Probiotic Tabs Give 1 tablet by mouth twice a day from 09/14/2018-09/24/2018   rosuvastatin 10 MG tablet Commonly known as: CRESTOR Take 10 mg by mouth daily.   sertraline 100 MG tablet Commonly known as: ZOLOFT Take 1 tablet  (100 mg total) by mouth daily.   sodium bicarbonate 650 MG tablet Take 1 tablet (650 mg total) by mouth 3 (three) times daily.   sulfamethoxazole-trimethoprim 800-160 MG tablet Commonly known as: BACTRIM DS Take 0.5 tablets by mouth 2 (two) times daily. With meals   traMADol 50 MG tablet Commonly known as: Ultram Take 1 tablet (50 mg total) by mouth every 6 (six) hours as needed.       Allergies:  Allergies  Allergen Reactions  . Ivp Dye [Iodinated Diagnostic Agents]     Due to partial renal failure  . Nalbuphine Other (See Comments)    PATIENT STATES IT MADE HIM FEEL LIKE HE WAS GOING TO DIE. NO SPECIFICS  . Codeine Anxiety    Family History: Family History  Problem Relation Age of Onset  . Coronary artery disease Other        family history, male < 20  . Arthritis Other        family history  . Cancer Other   . Kidney disease Other     Social History:  reports that he quit smoking about 48 years ago. His smoking use included cigarettes. He started smoking about 58 years ago. He smoked 2.00 packs per day. He has never used smokeless tobacco. He reports that he does not drink alcohol and does not use drugs.  ROS: All other review of systems were reviewed and are negative except what is noted above in HPI  Physical Exam: BP 134/66   Pulse 80   Temp 98.9 F (37.2 C)   Ht 5\' 8"  (1.727 m)   Wt 177 lb (80.3 kg)   BMI 26.91 kg/m   Constitutional:  Alert and oriented, No acute distress. HEENT: [place] AT, moist mucus membranes.  Trachea midline, no masses. Cardiovascular: No clubbing, cyanosis, or edema. Respiratory: Normal respiratory effort, no increased work of breathing. GI: Abdomen is soft, nontender, nondistended, no abdominal masses GU: No CVA tenderness.  Lymph: No cervical or inguinal lymphadenopathy. Skin: No rashes, bruises or suspicious lesions. Neurologic: Grossly intact, no focal deficits, moving all 4 extremities. Psychiatric: Normal mood and  affect.  Laboratory Data: Lab Results  Component Value Date   WBC 6.1 12/12/2020   HGB 8.7 (L) 12/28/2020   HCT 30.4 (L) 12/12/2020   MCV 105.6 (H) 12/12/2020   PLT 115 (L) 12/12/2020    Lab Results  Component Value Date   CREATININE 4.80 (H) 12/12/2020    No results found for: PSA  No results found for: TESTOSTERONE  Lab Results  Component Value Date   HGBA1C 5.5 05/29/2020    Urinalysis  Component Value Date/Time   COLORURINE YELLOW 12/12/2020 1541   APPEARANCEUR CLEAR 12/12/2020 1541   LABSPEC 1.012 12/12/2020 1541   PHURINE 7.0 12/12/2020 1541   GLUCOSEU NEGATIVE 12/12/2020 1541   HGBUR NEGATIVE 12/12/2020 1541   BILIRUBINUR NEGATIVE 12/12/2020 1541   KETONESUR NEGATIVE 12/12/2020 1541   PROTEINUR 100 (A) 12/12/2020 1541   UROBILINOGEN 0.2 03/14/2010 0809   NITRITE NEGATIVE 12/12/2020 1541   LEUKOCYTESUR NEGATIVE 12/12/2020 1541    Lab Results  Component Value Date   BACTERIA NONE SEEN 12/12/2020    Pertinent Imaging:  No results found for this or any previous visit.  No results found for this or any previous visit.  No results found for this or any previous visit.  No results found for this or any previous visit.  Results for orders placed during the hospital encounter of 11/02/20  US RENAL  Narrative CLINICAL DATA:  Chronic kidney disease.  EXAM: RENAL / URINARY TRACT ULTRASOUND COMPLETE  COMPARISON:  December 10, 2019.  FINDINGS: Right Kidney:  Renal measurements: 8.7 x 4.9 x 4.0 cm = volume: 89 mL. Increased echogenicity of renal parenchyma is noted suggesting medical renal disease. No mass or hydronephrosis visualized.  Left Kidney:  Renal measurements: 10.8 x 5.0 x 4.5 cm = volume: 126 mL. Echogenicity within normal limits. No mass or hydronephrosis visualized.  Bladder:  Not visualized.  Other:  None.  IMPRESSION: Right renal atrophy is noted. Left kidney is unremarkable. No hydronephrosis or renal obstruction is  noted.   Electronically Signed By: Marijo Conception M.D. On: 11/03/2020 09:19  No results found for this or any previous visit.  No results found for this or any previous visit.  Results for orders placed during the hospital encounter of 05/28/20  CT Renal Stone Study  Narrative CLINICAL DATA:  Urinary retention. Lower abdominal pain and groin pain  EXAM: CT ABDOMEN AND PELVIS WITHOUT CONTRAST  TECHNIQUE: Multidetector CT imaging of the abdomen and pelvis was performed following the standard protocol without IV contrast.  COMPARISON:  July 19, 2018.  FINDINGS: Lower chest: The lung bases are clear. The heart is enlarged.  Hepatobiliary: The liver is normal. Normal gallbladder.There is no biliary ductal dilation.  Pancreas: Normal contours without ductal dilatation. No peripancreatic fluid collection.  Spleen: Unremarkable.  Adrenals/Urinary Tract:  --Adrenal glands: Unremarkable.  --Right kidney/ureter: There is mild right-sided hydroureteronephrosis to the level of the urinary bladder. There is no evidence for an obstructing stone.  --Left kidney/ureter: There is mild left-sided hydroureteronephrosis to the level of the urinary bladder. There is no evidence for an obstructing stone.  --Urinary bladder: The urinary bladder is decompressed with a Foley catheter. There is bladder wall thickening.  Stomach/Bowel:  --Stomach/Duodenum: No hiatal hernia or other gastric abnormality. Normal duodenal course and caliber.  --Small bowel: Unremarkable.  --Colon: Rectosigmoid diverticulosis without acute inflammation.  --Appendix: Normal.  Vascular/Lymphatic: Atherosclerotic calcification is present within the non-aneurysmal abdominal aorta, without hemodynamically significant stenosis.  --No retroperitoneal lymphadenopathy.  --No mesenteric lymphadenopathy.  --No pelvic or inguinal lymphadenopathy.  Reproductive: The patient is status post prior  prostatectomy.  Other: No ascites or free air. There is a fat containing left inguinal hernia.  Musculoskeletal. There is an age-indeterminate compression fracture of the L3 vertebral body, new since 2019.  IMPRESSION: 1. Mild bilateral hydroureteronephrosis to the level of the urinary bladder. There is no evidence for an obstructing stone. 2. Rectosigmoid diverticulosis without acute inflammation. 3. Fat containing left inguinal hernia.  Aortic Atherosclerosis (ICD10-I70.0).  Electronically Signed By: Constance Holster M.D. On: 05/28/2020 18:59   Assessment & Plan:    1. Gross hematuria -resolved - Urinalysis, Routine w reflex microscopic - ciprofloxacin (CIPRO) tablet 500 mg  2. OAB (overactive bladder) -We discussed the management of OAB including behavioral therapy and medical therapy and the patient elects to pursue timed voiding.    Return in about 1 year (around 01/10/2022) for PSA.  Nicolette Bang, MD  [facility]

## 2021-01-10 NOTE — Progress Notes (Signed)
Bladder Scan Patient cannot void: 0 ml Performed By: Durenda Guthrie, lpn    Urological Symptom Review  Patient is experiencing the following symptoms: Frequent urination Leakage of urine Stream starts and stops Trouble starting stream Have to strain to urinate  Kidney stones   Review of Systems  Gastrointestinal (upper)  : Negative for upper GI symptoms  Gastrointestinal (lower) : Negative for lower GI symptoms  Constitutional : Fatigue  Skin: Negative for skin symptoms  Eyes: Negative for eye symptoms  Ear/Nose/Throat : Negative for Ear/Nose/Throat symptoms  Hematologic/Lymphatic: Easy bruising  Cardiovascular : Negative for cardiovascular symptoms  Respiratory : Negative for respiratory symptoms  Endocrine: Negative for endocrine symptoms  Musculoskeletal: Negative for musculoskeletal symptoms  Neurological: Negative for neurological symptoms  Psychologic: Depression Anxiety

## 2021-01-11 ENCOUNTER — Encounter (HOSPITAL_COMMUNITY)
Admission: RE | Admit: 2021-01-11 | Discharge: 2021-01-11 | Disposition: A | Discharge status: Home / Self Care | Payer: MEDICARE | Source: Ambulatory Visit | Attending: Nephrology | Admitting: Nephrology

## 2021-01-11 ENCOUNTER — Encounter (HOSPITAL_COMMUNITY): Payer: Self-pay

## 2021-01-11 DIAGNOSIS — D631 Anemia in chronic kidney disease: Secondary | ICD-10-CM | POA: Diagnosis not present

## 2021-01-11 DIAGNOSIS — N184 Chronic kidney disease, stage 4 (severe): Secondary | ICD-10-CM | POA: Insufficient documentation

## 2021-01-11 LAB — POCT HEMOGLOBIN-HEMACUE: Hemoglobin: 8.5 g/dL — ABNORMAL LOW (ref 13.0–17.0)

## 2021-01-11 LAB — PSA: Prostate Specific Ag, Serum: 0.3 ng/mL (ref 0.0–4.0)

## 2021-01-11 MED ORDER — EPOETIN ALFA 3000 UNIT/ML IJ SOLN
INTRAMUSCULAR | Status: AC
  Filled 2021-01-11: qty 1

## 2021-01-11 MED ORDER — EPOETIN ALFA 3000 UNIT/ML IJ SOLN
3000.0000 [IU] | Freq: Once | INTRAMUSCULAR | Status: AC
  Administered 2021-01-11: 3000 [IU] via SUBCUTANEOUS

## 2021-01-12 DIAGNOSIS — I129 Hypertensive chronic kidney disease with stage 1 through stage 4 chronic kidney disease, or unspecified chronic kidney disease: Secondary | ICD-10-CM | POA: Diagnosis not present

## 2021-01-12 DIAGNOSIS — N189 Chronic kidney disease, unspecified: Secondary | ICD-10-CM | POA: Diagnosis not present

## 2021-01-12 DIAGNOSIS — N185 Chronic kidney disease, stage 5: Secondary | ICD-10-CM | POA: Diagnosis not present

## 2021-01-12 DIAGNOSIS — E871 Hypo-osmolality and hyponatremia: Secondary | ICD-10-CM | POA: Diagnosis not present

## 2021-01-12 DIAGNOSIS — D631 Anemia in chronic kidney disease: Secondary | ICD-10-CM | POA: Diagnosis not present

## 2021-01-12 DIAGNOSIS — E211 Secondary hyperparathyroidism, not elsewhere classified: Secondary | ICD-10-CM | POA: Diagnosis not present

## 2021-01-14 DIAGNOSIS — N1330 Unspecified hydronephrosis: Secondary | ICD-10-CM | POA: Diagnosis not present

## 2021-01-14 DIAGNOSIS — M4856XD Collapsed vertebra, not elsewhere classified, lumbar region, subsequent encounter for fracture with routine healing: Secondary | ICD-10-CM | POA: Diagnosis not present

## 2021-01-14 DIAGNOSIS — N393 Stress incontinence (female) (male): Secondary | ICD-10-CM | POA: Diagnosis not present

## 2021-01-14 DIAGNOSIS — E785 Hyperlipidemia, unspecified: Secondary | ICD-10-CM | POA: Diagnosis not present

## 2021-01-14 DIAGNOSIS — I480 Paroxysmal atrial fibrillation: Secondary | ICD-10-CM | POA: Diagnosis not present

## 2021-01-14 DIAGNOSIS — S62622D Displaced fracture of medial phalanx of right middle finger, subsequent encounter for fracture with routine healing: Secondary | ICD-10-CM | POA: Diagnosis not present

## 2021-01-14 DIAGNOSIS — G4700 Insomnia, unspecified: Secondary | ICD-10-CM | POA: Diagnosis not present

## 2021-01-14 DIAGNOSIS — S62326D Displaced fracture of shaft of fifth metacarpal bone, right hand, subsequent encounter for fracture with routine healing: Secondary | ICD-10-CM | POA: Diagnosis not present

## 2021-01-14 DIAGNOSIS — D631 Anemia in chronic kidney disease: Secondary | ICD-10-CM | POA: Diagnosis not present

## 2021-01-14 DIAGNOSIS — S0121XD Laceration without foreign body of nose, subsequent encounter: Secondary | ICD-10-CM | POA: Diagnosis not present

## 2021-01-14 DIAGNOSIS — M48062 Spinal stenosis, lumbar region with neurogenic claudication: Secondary | ICD-10-CM | POA: Diagnosis not present

## 2021-01-14 DIAGNOSIS — S51012D Laceration without foreign body of left elbow, subsequent encounter: Secondary | ICD-10-CM | POA: Diagnosis not present

## 2021-01-14 DIAGNOSIS — N184 Chronic kidney disease, stage 4 (severe): Secondary | ICD-10-CM | POA: Diagnosis not present

## 2021-01-14 DIAGNOSIS — S51812D Laceration without foreign body of left forearm, subsequent encounter: Secondary | ICD-10-CM | POA: Diagnosis not present

## 2021-01-14 DIAGNOSIS — S63284D Dislocation of proximal interphalangeal joint of right ring finger, subsequent encounter: Secondary | ICD-10-CM | POA: Diagnosis not present

## 2021-01-14 DIAGNOSIS — I2510 Atherosclerotic heart disease of native coronary artery without angina pectoris: Secondary | ICD-10-CM | POA: Diagnosis not present

## 2021-01-14 DIAGNOSIS — S0181XD Laceration without foreign body of other part of head, subsequent encounter: Secondary | ICD-10-CM | POA: Diagnosis not present

## 2021-01-14 DIAGNOSIS — M47816 Spondylosis without myelopathy or radiculopathy, lumbar region: Secondary | ICD-10-CM | POA: Diagnosis not present

## 2021-01-14 DIAGNOSIS — F419 Anxiety disorder, unspecified: Secondary | ICD-10-CM | POA: Diagnosis not present

## 2021-01-14 DIAGNOSIS — M109 Gout, unspecified: Secondary | ICD-10-CM | POA: Diagnosis not present

## 2021-01-14 DIAGNOSIS — I129 Hypertensive chronic kidney disease with stage 1 through stage 4 chronic kidney disease, or unspecified chronic kidney disease: Secondary | ICD-10-CM | POA: Diagnosis not present

## 2021-01-14 DIAGNOSIS — S62354D Nondisplaced fracture of shaft of fourth metacarpal bone, right hand, subsequent encounter for fracture with routine healing: Secondary | ICD-10-CM | POA: Diagnosis not present

## 2021-01-14 DIAGNOSIS — K219 Gastro-esophageal reflux disease without esophagitis: Secondary | ICD-10-CM | POA: Diagnosis not present

## 2021-01-14 DIAGNOSIS — I6529 Occlusion and stenosis of unspecified carotid artery: Secondary | ICD-10-CM | POA: Diagnosis not present

## 2021-01-14 DIAGNOSIS — Z8546 Personal history of malignant neoplasm of prostate: Secondary | ICD-10-CM | POA: Diagnosis not present

## 2021-01-16 ENCOUNTER — Telehealth: Payer: Self-pay

## 2021-01-16 NOTE — Telephone Encounter (Signed)
Patient called and made aware.

## 2021-01-16 NOTE — Telephone Encounter (Signed)
-----   Message from Cleon Gustin, MD sent at 01/16/2021 10:00 AM EST ----- normal ----- Message ----- From: Iris Pert, LPN Sent: [phone]   2:17 PM EST To: Cleon Gustin, MD  Please review

## 2021-01-17 DIAGNOSIS — E872 Acidosis: Secondary | ICD-10-CM | POA: Diagnosis not present

## 2021-01-17 DIAGNOSIS — E871 Hypo-osmolality and hyponatremia: Secondary | ICD-10-CM | POA: Diagnosis not present

## 2021-01-17 DIAGNOSIS — R809 Proteinuria, unspecified: Secondary | ICD-10-CM | POA: Diagnosis not present

## 2021-01-17 DIAGNOSIS — D631 Anemia in chronic kidney disease: Secondary | ICD-10-CM | POA: Diagnosis not present

## 2021-01-17 DIAGNOSIS — N189 Chronic kidney disease, unspecified: Secondary | ICD-10-CM | POA: Diagnosis not present

## 2021-01-17 DIAGNOSIS — E211 Secondary hyperparathyroidism, not elsewhere classified: Secondary | ICD-10-CM | POA: Diagnosis not present

## 2021-01-17 DIAGNOSIS — I129 Hypertensive chronic kidney disease with stage 1 through stage 4 chronic kidney disease, or unspecified chronic kidney disease: Secondary | ICD-10-CM | POA: Diagnosis not present

## 2021-01-17 DIAGNOSIS — N185 Chronic kidney disease, stage 5: Secondary | ICD-10-CM | POA: Diagnosis not present

## 2021-01-24 DIAGNOSIS — L308 Other specified dermatitis: Secondary | ICD-10-CM | POA: Diagnosis not present

## 2021-01-26 ENCOUNTER — Ambulatory Visit (HOSPITAL_COMMUNITY): Payer: Medicare Other

## 2021-01-30 ENCOUNTER — Encounter (HOSPITAL_COMMUNITY)
Admission: RE | Admit: 2021-01-30 | Discharge: 2021-01-30 | Disposition: A | Discharge status: Home / Self Care | Payer: MEDICARE | Source: Ambulatory Visit | Attending: Nephrology | Admitting: Nephrology

## 2021-01-30 ENCOUNTER — Other Ambulatory Visit: Payer: Self-pay

## 2021-01-30 ENCOUNTER — Encounter (HOSPITAL_COMMUNITY): Payer: Self-pay

## 2021-01-30 DIAGNOSIS — N184 Chronic kidney disease, stage 4 (severe): Secondary | ICD-10-CM | POA: Insufficient documentation

## 2021-01-30 DIAGNOSIS — D631 Anemia in chronic kidney disease: Secondary | ICD-10-CM | POA: Insufficient documentation

## 2021-01-30 LAB — POCT HEMOGLOBIN-HEMACUE: Hemoglobin: 10.4 g/dL — ABNORMAL LOW (ref 13.0–17.0)

## 2021-01-30 MED ORDER — EPOETIN ALFA 3000 UNIT/ML IJ SOLN: 3000.0000 [IU] | Freq: Once | INTRAMUSCULAR | Status: DC

## 2021-01-31 DIAGNOSIS — S62356A Nondisplaced fracture of shaft of fifth metacarpal bone, right hand, initial encounter for closed fracture: Secondary | ICD-10-CM | POA: Insufficient documentation

## 2021-01-31 DIAGNOSIS — S63289A Dislocation of proximal interphalangeal joint of unspecified finger, initial encounter: Secondary | ICD-10-CM | POA: Insufficient documentation

## 2021-02-05 ENCOUNTER — Encounter: Payer: Self-pay | Admitting: Orthopedic Surgery

## 2021-02-05 ENCOUNTER — Other Ambulatory Visit: Payer: Self-pay

## 2021-02-05 ENCOUNTER — Ambulatory Visit: Payer: MEDICARE

## 2021-02-05 ENCOUNTER — Ambulatory Visit (INDEPENDENT_AMBULATORY_CARE_PROVIDER_SITE_OTHER): Payer: MEDICARE | Admitting: Orthopedic Surgery

## 2021-02-05 DIAGNOSIS — S62356D Nondisplaced fracture of shaft of fifth metacarpal bone, right hand, subsequent encounter for fracture with routine healing: Secondary | ICD-10-CM

## 2021-02-05 DIAGNOSIS — S63289D Dislocation of proximal interphalangeal joint of unspecified finger, subsequent encounter: Secondary | ICD-10-CM

## 2021-02-05 DIAGNOSIS — S32039A Unspecified fracture of third lumbar vertebra, initial encounter for closed fracture: Secondary | ICD-10-CM | POA: Insufficient documentation

## 2021-02-05 NOTE — Patient Instructions (Signed)
Right hand full weightbearing normal activity

## 2021-02-05 NOTE — Progress Notes (Signed)
Chief Complaint  Patient presents with  . Hand Injury    Right 12/12/20    Fracture right fifth metacarpal on around December 12, 2020 here for x-ray follow-up  xrays mild angulation on the lateral view normal alignment on the AP view Fracture line is visible callus is noted to bridge the fracture site on some of the x-rays  Clinically nontender full range of motion, small knot on the dorsum of the hand from the angulation  Should not be a significant issue for him  Routine activity follow-up as needed

## 2021-02-08 DIAGNOSIS — Z0189 Encounter for other specified special examinations: Secondary | ICD-10-CM | POA: Diagnosis not present

## 2021-02-08 DIAGNOSIS — I6523 Occlusion and stenosis of bilateral carotid arteries: Secondary | ICD-10-CM | POA: Diagnosis not present

## 2021-02-08 DIAGNOSIS — G4700 Insomnia, unspecified: Secondary | ICD-10-CM | POA: Diagnosis not present

## 2021-02-08 DIAGNOSIS — K219 Gastro-esophageal reflux disease without esophagitis: Secondary | ICD-10-CM | POA: Diagnosis not present

## 2021-02-08 DIAGNOSIS — Z8546 Personal history of malignant neoplasm of prostate: Secondary | ICD-10-CM | POA: Diagnosis not present

## 2021-02-08 DIAGNOSIS — N136 Pyonephrosis: Secondary | ICD-10-CM | POA: Diagnosis not present

## 2021-02-08 DIAGNOSIS — N186 End stage renal disease: Secondary | ICD-10-CM | POA: Diagnosis not present

## 2021-02-08 DIAGNOSIS — I25119 Atherosclerotic heart disease of native coronary artery with unspecified angina pectoris: Secondary | ICD-10-CM | POA: Diagnosis not present

## 2021-02-08 DIAGNOSIS — Z86718 Personal history of other venous thrombosis and embolism: Secondary | ICD-10-CM | POA: Diagnosis not present

## 2021-02-08 DIAGNOSIS — E785 Hyperlipidemia, unspecified: Secondary | ICD-10-CM | POA: Diagnosis not present

## 2021-02-08 DIAGNOSIS — I120 Hypertensive chronic kidney disease with stage 5 chronic kidney disease or end stage renal disease: Secondary | ICD-10-CM | POA: Diagnosis not present

## 2021-02-08 DIAGNOSIS — I10 Essential (primary) hypertension: Secondary | ICD-10-CM | POA: Diagnosis not present

## 2021-02-13 ENCOUNTER — Encounter (HOSPITAL_COMMUNITY): Payer: Self-pay

## 2021-02-13 ENCOUNTER — Other Ambulatory Visit: Payer: Self-pay

## 2021-02-13 ENCOUNTER — Encounter (HOSPITAL_COMMUNITY)
Admission: RE | Admit: 2021-02-13 | Discharge: 2021-02-13 | Disposition: A | Discharge status: Home / Self Care | Payer: MEDICARE | Source: Ambulatory Visit | Attending: Nephrology | Admitting: Nephrology

## 2021-02-13 DIAGNOSIS — I120 Hypertensive chronic kidney disease with stage 5 chronic kidney disease or end stage renal disease: Secondary | ICD-10-CM | POA: Diagnosis not present

## 2021-02-13 DIAGNOSIS — Z8546 Personal history of malignant neoplasm of prostate: Secondary | ICD-10-CM | POA: Diagnosis not present

## 2021-02-13 DIAGNOSIS — I6523 Occlusion and stenosis of bilateral carotid arteries: Secondary | ICD-10-CM | POA: Diagnosis not present

## 2021-02-13 DIAGNOSIS — Z86718 Personal history of other venous thrombosis and embolism: Secondary | ICD-10-CM | POA: Diagnosis not present

## 2021-02-13 DIAGNOSIS — N136 Pyonephrosis: Secondary | ICD-10-CM | POA: Diagnosis not present

## 2021-02-13 DIAGNOSIS — Z712 Person consulting for explanation of examination or test findings: Secondary | ICD-10-CM | POA: Diagnosis not present

## 2021-02-13 DIAGNOSIS — N184 Chronic kidney disease, stage 4 (severe): Secondary | ICD-10-CM | POA: Insufficient documentation

## 2021-02-13 DIAGNOSIS — D631 Anemia in chronic kidney disease: Secondary | ICD-10-CM | POA: Insufficient documentation

## 2021-02-13 DIAGNOSIS — N186 End stage renal disease: Secondary | ICD-10-CM | POA: Diagnosis not present

## 2021-02-13 DIAGNOSIS — E875 Hyperkalemia: Secondary | ICD-10-CM | POA: Diagnosis not present

## 2021-02-13 DIAGNOSIS — E785 Hyperlipidemia, unspecified: Secondary | ICD-10-CM | POA: Diagnosis not present

## 2021-02-13 LAB — POCT HEMOGLOBIN-HEMACUE: Hemoglobin: 9.2 g/dL — ABNORMAL LOW (ref 13.0–17.0)

## 2021-02-13 MED ORDER — EPOETIN ALFA 3000 UNIT/ML IJ SOLN
3000.0000 [IU] | Freq: Once | INTRAMUSCULAR | Status: AC
  Administered 2021-02-13: 3000 [IU] via SUBCUTANEOUS
  Filled 2021-02-13: qty 1

## 2021-02-27 ENCOUNTER — Encounter (HOSPITAL_COMMUNITY): Admission: RE | Admit: 2021-02-27 | Payer: MEDICARE | Source: Ambulatory Visit

## 2021-03-01 ENCOUNTER — Encounter (HOSPITAL_COMMUNITY): Payer: Self-pay

## 2021-03-01 ENCOUNTER — Encounter (HOSPITAL_COMMUNITY)
Admission: RE | Admit: 2021-03-01 | Discharge: 2021-03-01 | Disposition: A | Discharge status: Home / Self Care | Payer: MEDICARE | Source: Ambulatory Visit | Attending: Nephrology | Admitting: Nephrology

## 2021-03-01 ENCOUNTER — Other Ambulatory Visit: Payer: Self-pay

## 2021-03-01 DIAGNOSIS — N184 Chronic kidney disease, stage 4 (severe): Secondary | ICD-10-CM | POA: Diagnosis not present

## 2021-03-01 MED ORDER — EPOETIN ALFA 3000 UNIT/ML IJ SOLN
3000.0000 [IU] | Freq: Once | INTRAMUSCULAR | Status: AC
  Administered 2021-03-01: 3000 [IU] via SUBCUTANEOUS

## 2021-03-01 MED ORDER — EPOETIN ALFA 3000 UNIT/ML IJ SOLN
INTRAMUSCULAR | Status: AC
  Filled 2021-03-01: qty 1

## 2021-03-08 ENCOUNTER — Encounter: Payer: Self-pay | Admitting: Emergency Medicine

## 2021-03-08 ENCOUNTER — Other Ambulatory Visit: Payer: Self-pay

## 2021-03-08 ENCOUNTER — Ambulatory Visit
Admission: EM | Admit: 2021-03-08 | Discharge: 2021-03-08 | Disposition: A | Discharge status: Home / Self Care | Payer: MEDICARE | Attending: Emergency Medicine | Admitting: Emergency Medicine

## 2021-03-08 DIAGNOSIS — H6982 Other specified disorders of Eustachian tube, left ear: Secondary | ICD-10-CM

## 2021-03-08 DIAGNOSIS — H9202 Otalgia, left ear: Secondary | ICD-10-CM

## 2021-03-08 MED ORDER — PREDNISONE 20 MG PO TABS: 20.0000 mg | ORAL_TABLET | Freq: Two times a day (BID) | ORAL | 0 refills | Status: AC

## 2021-03-08 MED ORDER — ACETIC ACID 2 % OT SOLN: 4.0000 [drp] | Freq: Three times a day (TID) | OTIC | 0 refills | Status: DC

## 2021-03-08 NOTE — Discharge Instructions (Signed)
Rest and drink plenty of fluids Prescribed steroid take as directed and to completion Ear drops prescribed.   Continue to use OTC ibuprofen and/ or tylenol as needed for pain control Follow up with PCP if symptoms persists Return here or go to the ER if you have any new or worsening symptoms fever, chills, nausea, vomiting, sinus congestion/ pressure, cough, etc..Marland Kitchen

## 2021-03-08 NOTE — ED Provider Notes (Signed)
[facility]   [id number] 03/08/21 Arrival Time: 5852  CC:EAR PAIN  SUBJECTIVE: History from: patient and family.  [name] [name] is a 87 y.o. male who presents with of LT ear pain x 3 days.  Denies a precipitating event, or trauma.  Patient states the pain is intermittent and sharp in character.  Patient has tried OTC medications without relief.  Symptoms are made worse with lying down.  Reports similar symptoms in the past.    Denies fever, chills, fatigue, sinus pain, rhinorrhea, ear discharge, sore throat, SOB, wheezing, chest pain, nausea, changes in bowel or bladder habits.    ROS: As per HPI.  All other pertinent ROS negative.     Past Medical History:  Diagnosis Date  . Anemia   . Anxiety   . 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   . Cancer ([place])    prostate ca 1999/removal/ rad tx  . Carotid artery occlusion   . Depression   . DVT (deep venous thrombosis) ([place])   . Gout   . History of kidney stones   . Hyperlipidemia   . Hypertension   . Incontinence   . Reflux   . Renal disorder   . Status post implantation of artificial urinary sphincter   . Wears dentures   . Wears glasses    Past Surgical History:  Procedure Laterality Date  . AV FISTULA PLACEMENT Left 05/15/2020   Procedure: LEFT FOREARM  ARTERIOVENOUS (AV) GRAFT INSERTION;  Surgeon: Angelia Mould, MD;  Location: [facility];  Service: Vascular;  Laterality: Left;  . CARDIAC CATHETERIZATION     2000-2001  . cataracts    . CORONARY STENT PLACEMENT    . HERNIA REPAIR    . MULTIPLE TOOTH EXTRACTIONS    . NEPHROSTOMY    . radical prostectomy    . URINARY SPHINCTER IMPLANT     Allergies  Allergen Reactions  . Ivp Dye [Iodinated Diagnostic Agents]     Due to partial renal failure  . Nalbuphine Other (See Comments)    PATIENT STATES IT MADE HIM FEEL LIKE HE WAS GOING TO DIE. NO SPECIFICS  . Codeine Anxiety   No current  facility-administered medications on file prior to encounter.   Current Outpatient Medications on File Prior to Encounter  Medication Sig Dispense Refill  . acetaminophen (TYLENOL) 500 MG tablet Take 1,000 mg by mouth every 8 (eight) hours as needed for mild pain or moderate pain. For headache pain    . ALFALFA PO Take 250 mg by mouth 2 (two) times daily.     Marland Kitchen allopurinol (ZYLOPRIM) 100 MG tablet Take 100 mg by mouth daily.   0  . ALPRAZolam (XANAX) 0.5 MG tablet Take 1 tablet (0.5 mg total) by mouth 2 (two) times daily as needed for anxiety. 15 tablet 0  . amLODipine (NORVASC) 10 MG tablet Take 10 mg by mouth daily.    . ARIPiprazole (ABILIFY) 20 MG tablet Take 1 tablet (20 mg total) by mouth daily. 30 tablet 3  . carvedilol (COREG) 3.125 MG tablet Take 6.25 mg by mouth 2 (two) times daily.    . diphenhydrAMINE (BENADRYL) 2 % cream Apply topically.    Marland Kitchen epoetin alfa (EPOGEN) 3000 UNIT/ML injection Inject 3,000 Units into the skin every 14 (fourteen) days.     . fish oil-omega-3 fatty acids 1000 MG capsule Take 1 g by mouth daily.    Marland Kitchen  furosemide (LASIX) 40 MG tablet Take 40 mg by mouth daily as needed.    . gabapentin (NEURONTIN) 100 MG capsule Take 1 capsule (100 mg total) by mouth at bedtime. 30 capsule 3  . HYDROcodone-acetaminophen (NORCO) 5-325 MG tablet Take 1 tablet by mouth every 6 (six) hours as needed for moderate pain. 12 tablet 0  . lansoprazole (PREVACID) 15 MG capsule Take 1 capsule (15 mg total) by mouth 2 (two) times daily before a meal. 60 capsule 4  . Multiple Vitamin (MULTIVITAMIN WITH MINERALS) TABS tablet Take 1 tablet by mouth daily.    Marland Kitchen ofloxacin (FLOXIN) 0.3 % OTIC solution Place 5 drops into both ears daily.  2  . Probiotic Product (RISA-BID PROBIOTIC) TABS Give 1 tablet by mouth twice a day from 09/14/2018-09/24/2018    . rosuvastatin (CRESTOR) 10 MG tablet Take 10 mg by mouth daily.    . sertraline (ZOLOFT) 100 MG tablet Take 1 tablet (100 mg total) by mouth daily.  30 tablet 3  . sodium bicarbonate 650 MG tablet Take 1 tablet (650 mg total) by mouth 3 (three) times daily. 90 tablet 1  . sulfamethoxazole-trimethoprim (BACTRIM DS) 800-160 MG tablet Take 0.5 tablets by mouth 2 (two) times daily. With meals    . traMADol (ULTRAM) 50 MG tablet Take 1 tablet (50 mg total) by mouth every 6 (six) hours as needed. 15 tablet 0   Social History   Socioeconomic History  . Marital status: Widowed    Spouse name: Not on file  . Number of children: 2  . Years of education: Not on file  . Highest education level: Not on file  Occupational History  . Occupation: retired  Tobacco Use  . Smoking status: Former Smoker    Packs/day: 2.00    Types: Cigarettes    Start date: 08/05/1962    Quit date: 12/02/1972    Years since quitting: 48.2  . Smokeless tobacco: Never Used  Vaping Use  . Vaping Use: Never used  Substance and Sexual Activity  . Alcohol use: No  . Drug use: No  . Sexual activity: Not on file  Other Topics Concern  . Not on file  Social History Narrative  . Not on file   Social Determinants of Health   Financial Resource Strain: Not on file  Food Insecurity: No Food Insecurity  . Worried About Charity fundraiser in the Last Year: Never true  . Ran Out of Food in the Last Year: Never true  Transportation Needs: No Transportation Needs  . Lack of Transportation (Medical): No  . Lack of Transportation (Non-Medical): No  Physical Activity: Not on file  Stress: Not on file  Social Connections: Not on file  Intimate Partner Violence: Not on file   Family History  Problem Relation Age of Onset  . Coronary artery disease Other        family history, male < 10  . Arthritis Other        family history  . Cancer Other   . Kidney disease Other     OBJECTIVE:  Vitals:   03/08/21 1821  BP: (!) 157/86  Pulse: 69  Resp: 18  Temp: 97.9 F (36.6 C)  TempSrc: Oral  SpO2: 93%     General appearance: alert; chronically ill appearing,  nontoxic; speaking in full sentences and tolerating own secretions HEENT: NCAT; Ears: EACs clear, TMs pearly gray; Eyes: PERRL.  EOM grossly intact.Nose: nares patent without rhinorrhea, Throat: oropharynx clear, tonsils non erythematous  or enlarged, uvula midline  Neck: supple without LAD Lungs: unlabored respirations, symmetrical air entry; cough: absent; no respiratory distress; CTAB Heart: regular rate and rhythm.  Skin: warm and dry Psychological: alert and cooperative; normal mood and affect   ASSESSMENT & PLAN:  1. Ear pain, left   2. Eustachian tube dysfunction, left     Meds ordered this encounter  Medications  . predniSONE (DELTASONE) 20 MG tablet    Sig: Take 1 tablet (20 mg total) by mouth 2 (two) times daily with a meal for 5 days.    Dispense:  10 tablet    Refill:  0    Order Specific Question:   Supervising Provider    Answer:   Raylene Everts [[id number]]  . acetic acid 2 % otic solution    Sig: Place 4 drops into the left ear 3 (three) times daily.    Dispense:  15 mL    Refill:  0    Order Specific Question:   Supervising Provider    Answer:   Raylene Everts [[id number]]    Rest and drink plenty of fluids Prescribed steroid take as directed and to completion Ear drops prescribed.   Continue to use OTC ibuprofen and/ or tylenol as needed for pain control Follow up with PCP if symptoms persists Return here or go to the ER if you have any new or worsening symptoms fever, chills, nausea, vomiting, sinus congestion/ pressure, cough, etc...  Reviewed expectations re: course of current medical issues. Questions answered. Outlined signs and symptoms indicating need for more acute intervention. Patient verbalized understanding. After Visit Summary given.         Lestine Box, PA-C 03/08/21 1901

## 2021-03-08 NOTE — ED Triage Notes (Signed)
Left ear pain x 3 days.   

## 2021-03-15 ENCOUNTER — Encounter (HOSPITAL_COMMUNITY)
Admission: RE | Admit: 2021-03-15 | Discharge: 2021-03-15 | Disposition: A | Discharge status: Home / Self Care | Payer: MEDICARE | Source: Ambulatory Visit | Attending: Nephrology | Admitting: Nephrology

## 2021-03-15 ENCOUNTER — Other Ambulatory Visit: Payer: Self-pay

## 2021-03-15 DIAGNOSIS — N189 Chronic kidney disease, unspecified: Secondary | ICD-10-CM | POA: Diagnosis not present

## 2021-03-15 DIAGNOSIS — R809 Proteinuria, unspecified: Secondary | ICD-10-CM | POA: Diagnosis not present

## 2021-03-15 DIAGNOSIS — N184 Chronic kidney disease, stage 4 (severe): Secondary | ICD-10-CM | POA: Diagnosis not present

## 2021-03-15 DIAGNOSIS — I129 Hypertensive chronic kidney disease with stage 1 through stage 4 chronic kidney disease, or unspecified chronic kidney disease: Secondary | ICD-10-CM | POA: Diagnosis not present

## 2021-03-15 DIAGNOSIS — D631 Anemia in chronic kidney disease: Secondary | ICD-10-CM | POA: Diagnosis not present

## 2021-03-15 DIAGNOSIS — E211 Secondary hyperparathyroidism, not elsewhere classified: Secondary | ICD-10-CM | POA: Diagnosis not present

## 2021-03-15 DIAGNOSIS — E872 Acidosis: Secondary | ICD-10-CM | POA: Diagnosis not present

## 2021-03-15 DIAGNOSIS — N185 Chronic kidney disease, stage 5: Secondary | ICD-10-CM | POA: Diagnosis not present

## 2021-03-15 LAB — CBC
HCT: 29.9 % — ABNORMAL LOW (ref 39.0–52.0)
Hemoglobin: 9.7 g/dL — ABNORMAL LOW (ref 13.0–17.0)
MCH: 31.5 pg (ref 26.0–34.0)
MCHC: 32.4 g/dL (ref 30.0–36.0)
MCV: 97.1 fL (ref 80.0–100.0)
Platelets: 172 10*3/uL (ref 150–400)
RBC: 3.08 MIL/uL — ABNORMAL LOW (ref 4.22–5.81)
RDW: 14.2 % (ref 11.5–15.5)
WBC: 7.9 10*3/uL (ref 4.0–10.5)
nRBC: 0 % (ref 0.0–0.2)

## 2021-03-15 LAB — RENAL FUNCTION PANEL
Albumin: 3.7 g/dL (ref 3.5–5.0)
Anion gap: 12 (ref 5–15)
BUN: 44 mg/dL — ABNORMAL HIGH (ref 8–23)
CO2: 22 mmol/L (ref 22–32)
Calcium: 8.5 mg/dL — ABNORMAL LOW (ref 8.9–10.3)
Chloride: 97 mmol/L — ABNORMAL LOW (ref 98–111)
Creatinine, Ser: 2.79 mg/dL — ABNORMAL HIGH (ref 0.61–1.24)
GFR, Estimated: 22 mL/min — ABNORMAL LOW (ref >60)
Glucose, Bld: 102 mg/dL — ABNORMAL HIGH (ref 70–99)
Phosphorus: 3.5 mg/dL (ref 2.5–4.6)
Potassium: 4.3 mmol/L (ref 3.5–5.1)
Sodium: 131 mmol/L — ABNORMAL LOW (ref 135–145)

## 2021-03-15 LAB — IRON AND TIBC
Iron: 43 ug/dL — ABNORMAL LOW (ref 45–182)
Saturation Ratios: 15 % — ABNORMAL LOW (ref 17.9–39.5)
TIBC: 279 ug/dL (ref 250–450)
UIBC: 236 ug/dL

## 2021-03-15 LAB — POCT HEMOGLOBIN-HEMACUE
Hemoglobin: 8.4 g/dL — ABNORMAL LOW (ref 13.0–17.0)
Hemoglobin: 9.8 g/dL — ABNORMAL LOW (ref 13.0–17.0)

## 2021-03-15 MED ORDER — EPOETIN ALFA 3000 UNIT/ML IJ SOLN
INTRAMUSCULAR | Status: AC
  Filled 2021-03-15: qty 1

## 2021-03-15 MED ORDER — EPOETIN ALFA 3000 UNIT/ML IJ SOLN
3000.0000 [IU] | Freq: Once | INTRAMUSCULAR | Status: AC
  Administered 2021-03-15: 3000 [IU] via SUBCUTANEOUS

## 2021-03-16 LAB — PARATHYROID HORMONE, INTACT (NO CA): PTH: 92 pg/mL — ABNORMAL HIGH (ref 15–65)

## 2021-03-23 DIAGNOSIS — E871 Hypo-osmolality and hyponatremia: Secondary | ICD-10-CM | POA: Diagnosis not present

## 2021-03-23 DIAGNOSIS — E211 Secondary hyperparathyroidism, not elsewhere classified: Secondary | ICD-10-CM | POA: Diagnosis not present

## 2021-03-23 DIAGNOSIS — I129 Hypertensive chronic kidney disease with stage 1 through stage 4 chronic kidney disease, or unspecified chronic kidney disease: Secondary | ICD-10-CM | POA: Diagnosis not present

## 2021-03-23 DIAGNOSIS — R809 Proteinuria, unspecified: Secondary | ICD-10-CM | POA: Diagnosis not present

## 2021-03-23 DIAGNOSIS — N184 Chronic kidney disease, stage 4 (severe): Secondary | ICD-10-CM | POA: Diagnosis not present

## 2021-03-23 DIAGNOSIS — E872 Acidosis: Secondary | ICD-10-CM | POA: Diagnosis not present

## 2021-03-23 DIAGNOSIS — D631 Anemia in chronic kidney disease: Secondary | ICD-10-CM | POA: Diagnosis not present

## 2021-03-23 DIAGNOSIS — N189 Chronic kidney disease, unspecified: Secondary | ICD-10-CM | POA: Diagnosis not present

## 2021-03-29 ENCOUNTER — Other Ambulatory Visit: Payer: Self-pay

## 2021-03-29 ENCOUNTER — Encounter (HOSPITAL_COMMUNITY)
Admission: RE | Admit: 2021-03-29 | Discharge: 2021-03-29 | Disposition: A | Discharge status: Home / Self Care | Payer: MEDICARE | Source: Ambulatory Visit | Attending: Nephrology | Admitting: Nephrology

## 2021-03-29 DIAGNOSIS — N184 Chronic kidney disease, stage 4 (severe): Secondary | ICD-10-CM | POA: Diagnosis not present

## 2021-03-29 LAB — POCT HEMOGLOBIN-HEMACUE: Hemoglobin: 9.1 g/dL — ABNORMAL LOW (ref 13.0–17.0)

## 2021-03-29 MED ORDER — EPOETIN ALFA 3000 UNIT/ML IJ SOLN
3000.0000 [IU] | Freq: Once | INTRAMUSCULAR | Status: AC
  Administered 2021-03-29: 3000 [IU] via SUBCUTANEOUS

## 2021-03-29 MED ORDER — EPOETIN ALFA 3000 UNIT/ML IJ SOLN
INTRAMUSCULAR | Status: AC
  Filled 2021-03-29: qty 1

## 2021-04-12 ENCOUNTER — Encounter (HOSPITAL_COMMUNITY): Payer: MEDICARE

## 2021-04-12 DIAGNOSIS — D638 Anemia in other chronic diseases classified elsewhere: Secondary | ICD-10-CM | POA: Diagnosis not present

## 2021-04-12 DIAGNOSIS — F419 Anxiety disorder, unspecified: Secondary | ICD-10-CM | POA: Diagnosis not present

## 2021-04-12 DIAGNOSIS — Z299 Encounter for prophylactic measures, unspecified: Secondary | ICD-10-CM | POA: Diagnosis not present

## 2021-04-12 DIAGNOSIS — I10 Essential (primary) hypertension: Secondary | ICD-10-CM | POA: Diagnosis not present

## 2021-04-12 DIAGNOSIS — R0600 Dyspnea, unspecified: Secondary | ICD-10-CM | POA: Diagnosis not present

## 2021-04-17 ENCOUNTER — Ambulatory Visit (HOSPITAL_COMMUNITY)
Admission: RE | Admit: 2021-04-17 | Discharge: 2021-04-17 | Disposition: A | Discharge status: Home / Self Care | Payer: MEDICARE | Source: Ambulatory Visit | Attending: Internal Medicine | Admitting: Internal Medicine

## 2021-04-17 ENCOUNTER — Other Ambulatory Visit: Payer: Self-pay

## 2021-04-17 ENCOUNTER — Encounter (HOSPITAL_COMMUNITY): Payer: Self-pay

## 2021-04-17 ENCOUNTER — Encounter (HOSPITAL_COMMUNITY)
Admission: RE | Admit: 2021-04-17 | Discharge: 2021-04-17 | Disposition: A | Discharge status: Home / Self Care | Payer: MEDICARE | Source: Ambulatory Visit | Attending: Nephrology | Admitting: Nephrology

## 2021-04-17 ENCOUNTER — Other Ambulatory Visit (HOSPITAL_COMMUNITY): Payer: Self-pay | Admitting: Specialist

## 2021-04-17 ENCOUNTER — Other Ambulatory Visit (HOSPITAL_COMMUNITY): Payer: Self-pay | Admitting: Internal Medicine

## 2021-04-17 DIAGNOSIS — R059 Cough, unspecified: Secondary | ICD-10-CM

## 2021-04-17 DIAGNOSIS — T17908S Unspecified foreign body in respiratory tract, part unspecified causing other injury, sequela: Secondary | ICD-10-CM

## 2021-04-17 DIAGNOSIS — R0602 Shortness of breath: Secondary | ICD-10-CM | POA: Diagnosis not present

## 2021-04-17 DIAGNOSIS — N184 Chronic kidney disease, stage 4 (severe): Secondary | ICD-10-CM | POA: Diagnosis not present

## 2021-04-17 DIAGNOSIS — F339 Major depressive disorder, recurrent, unspecified: Secondary | ICD-10-CM | POA: Diagnosis not present

## 2021-04-17 DIAGNOSIS — I10 Essential (primary) hypertension: Secondary | ICD-10-CM | POA: Diagnosis not present

## 2021-04-17 DIAGNOSIS — N185 Chronic kidney disease, stage 5: Secondary | ICD-10-CM | POA: Diagnosis not present

## 2021-04-17 DIAGNOSIS — Z299 Encounter for prophylactic measures, unspecified: Secondary | ICD-10-CM | POA: Diagnosis not present

## 2021-04-17 DIAGNOSIS — C61 Malignant neoplasm of prostate: Secondary | ICD-10-CM | POA: Diagnosis not present

## 2021-04-17 DIAGNOSIS — I509 Heart failure, unspecified: Secondary | ICD-10-CM | POA: Diagnosis not present

## 2021-04-17 LAB — CBC WITH DIFFERENTIAL/PLATELET
Abs Immature Granulocytes: 0.01 10*3/uL (ref 0.00–0.07)
Basophils Absolute: 0 10*3/uL (ref 0.0–0.1)
Basophils Relative: 1 %
Eosinophils Absolute: 0.8 10*3/uL — ABNORMAL HIGH (ref 0.0–0.5)
Eosinophils Relative: 13 %
HCT: 27 % — ABNORMAL LOW (ref 39.0–52.0)
Hemoglobin: 8.7 g/dL — ABNORMAL LOW (ref 13.0–17.0)
Immature Granulocytes: 0 %
Lymphocytes Relative: 12 %
Lymphs Abs: 0.7 10*3/uL (ref 0.7–4.0)
MCH: 32.8 pg (ref 26.0–34.0)
MCHC: 32.2 g/dL (ref 30.0–36.0)
MCV: 101.9 fL — ABNORMAL HIGH (ref 80.0–100.0)
Monocytes Absolute: 0.6 10*3/uL (ref 0.1–1.0)
Monocytes Relative: 9 %
Neutro Abs: 4.3 10*3/uL (ref 1.7–7.7)
Neutrophils Relative %: 65 %
Platelets: 137 10*3/uL — ABNORMAL LOW (ref 150–400)
RBC: 2.65 MIL/uL — ABNORMAL LOW (ref 4.22–5.81)
RDW: 14.3 % (ref 11.5–15.5)
WBC: 6.4 10*3/uL (ref 4.0–10.5)
nRBC: 0 % (ref 0.0–0.2)

## 2021-04-17 LAB — RENAL FUNCTION PANEL
Albumin: 3.4 g/dL — ABNORMAL LOW (ref 3.5–5.0)
Anion gap: 10 (ref 5–15)
BUN: 35 mg/dL — ABNORMAL HIGH (ref 8–23)
CO2: 22 mmol/L (ref 22–32)
Calcium: 8.4 mg/dL — ABNORMAL LOW (ref 8.9–10.3)
Chloride: 93 mmol/L — ABNORMAL LOW (ref 98–111)
Creatinine, Ser: 3.14 mg/dL — ABNORMAL HIGH (ref 0.61–1.24)
GFR, Estimated: 19 mL/min — ABNORMAL LOW (ref >60)
Glucose, Bld: 90 mg/dL (ref 70–99)
Phosphorus: 4.1 mg/dL (ref 2.5–4.6)
Potassium: 4.3 mmol/L (ref 3.5–5.1)
Sodium: 125 mmol/L — ABNORMAL LOW (ref 135–145)

## 2021-04-17 LAB — POCT HEMOGLOBIN-HEMACUE: Hemoglobin: 8.7 g/dL — ABNORMAL LOW (ref 13.0–17.0)

## 2021-04-17 MED ORDER — EPOETIN ALFA 3000 UNIT/ML IJ SOLN: 3000.0000 [IU] | Freq: Once | INTRAMUSCULAR | Status: DC

## 2021-04-17 MED ORDER — EPOETIN ALFA 3000 UNIT/ML IJ SOLN
INTRAMUSCULAR | Status: AC
  Administered 2021-04-17: 3000 [IU] via SUBCUTANEOUS
  Filled 2021-04-17: qty 1

## 2021-04-17 NOTE — Progress Notes (Signed)
Patient arrived today for labs, stated just had lab work @ Dr.Vyas today. Patient is unable to void for urine test, only able to draw 2 vials of blood after 2  Attempts. Will send this to lab. Also, going to xray for chest film today. Hemacue was obtained with Hgb of 8.7 received Procrit 3000 units as indicated.

## 2021-04-23 ENCOUNTER — Other Ambulatory Visit (HOSPITAL_COMMUNITY): Payer: Self-pay | Admitting: Internal Medicine

## 2021-04-23 DIAGNOSIS — R0602 Shortness of breath: Secondary | ICD-10-CM

## 2021-04-25 ENCOUNTER — Other Ambulatory Visit: Payer: Self-pay

## 2021-04-25 ENCOUNTER — Ambulatory Visit (HOSPITAL_COMMUNITY): Payer: MEDICARE | Attending: Internal Medicine | Admitting: Speech Pathology

## 2021-04-25 ENCOUNTER — Ambulatory Visit (HOSPITAL_COMMUNITY)
Admission: RE | Admit: 2021-04-25 | Discharge: 2021-04-25 | Disposition: A | Discharge status: Home / Self Care | Payer: MEDICARE | Source: Ambulatory Visit | Attending: Internal Medicine | Admitting: Internal Medicine

## 2021-04-25 ENCOUNTER — Encounter (HOSPITAL_COMMUNITY): Payer: Self-pay | Admitting: Speech Pathology

## 2021-04-25 DIAGNOSIS — R1319 Other dysphagia: Secondary | ICD-10-CM | POA: Insufficient documentation

## 2021-04-25 DIAGNOSIS — T17908S Unspecified foreign body in respiratory tract, part unspecified causing other injury, sequela: Secondary | ICD-10-CM

## 2021-04-25 DIAGNOSIS — R1310 Dysphagia, unspecified: Secondary | ICD-10-CM | POA: Diagnosis not present

## 2021-04-25 DIAGNOSIS — R0602 Shortness of breath: Secondary | ICD-10-CM | POA: Diagnosis not present

## 2021-04-25 DIAGNOSIS — Z0389 Encounter for observation for other suspected diseases and conditions ruled out: Secondary | ICD-10-CM | POA: Diagnosis not present

## 2021-04-25 NOTE — Therapy (Signed)
[place], [place], [address] Phone: [phone]   Fax:  [phone]  Modified Barium Swallow  Patient Details  Name: [name] [name] MRN: [id number] Date of Birth: [birth year] No data recorded  Encounter Date: 04/25/2021     Today's Date: 04/25/2021 Time: No data recorded-No data recorded No data recorded  Past Medical History:  Past Medical History:  Diagnosis Date  . Anemia   . Anxiety   . 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   . Cancer ([place])    prostate ca 1999/removal/ rad tx  . Carotid artery occlusion   . Depression   . DVT (deep venous thrombosis) ([place])   . Gout   . History of kidney stones   . Hyperlipidemia   . Hypertension   . Incontinence   . Reflux   . Renal disorder   . Status post implantation of artificial urinary sphincter   . Wears dentures   . Wears glasses    Past Surgical History:  Past Surgical History:  Procedure Laterality Date  . AV FISTULA PLACEMENT Left 05/15/2020   Procedure: LEFT FOREARM  ARTERIOVENOUS (AV) GRAFT INSERTION;  Surgeon: Angelia Mould, MD;  Location: [facility];  Service: Vascular;  Laterality: Left;  . CARDIAC CATHETERIZATION     2000-2001  . cataracts    . CORONARY STENT PLACEMENT    . HERNIA REPAIR    . MULTIPLE TOOTH EXTRACTIONS    . NEPHROSTOMY    . radical prostectomy    . URINARY SPHINCTER IMPLANT     HPI: Mr Mozer is a 87yo who was referred for an MBSS in order to objectively assess the swallowing function; Pt reports he gets 'strangled' about "once a week" on water. Pt with hx of reflux and wears dentures.   No data recorded   Assessment / Plan / Recommendation  CHL IP CLINICAL IMPRESSIONS 04/25/2021  Clinical Impression Pt's oropharyngeal swallowing was noted to be grossly [facility] today. No penetration or aspiration was visualized. Note premature spillage to the  level of the pyriforms, however laryngeal vestibule closure was adequate and hyolaryngeal excursion was good. Consistent min residue noted in the valleculae and pyriforms with thin liquids that was cleared with a cued or reflexive repeat swallow. Note prominent CP. Pt reports his "choking" happens with thin liquids when drinking from a bottle. SLP provided recommendation for Pt to pour bottled water into a cup and consume with a straw or via cup -- Question if Pt tilts his head back and consumes multiple consecutive swallows with a bottle. Further recommend continue with regular diet and thin liquids; meds are ok whole with liquids. Recommend Pt produce an additional dry swallow after the initial swallow. There is no ST f/u needed at this time. Thank you,  SLP Visit Diagnosis Dysphagia, unspecified (R13.10)  Attention and concentration deficit following --  Frontal lobe and executive function deficit following --  Impact on safety and function Mild aspiration risk      CHL IP TREATMENT RECOMMENDATION 04/25/2021  Treatment Recommendations No treatment recommended at this time     No flowsheet data found.  CHL IP DIET RECOMMENDATION 04/25/2021  SLP Diet Recommendations Regular solids;Thin liquid  Liquid Administration via Cup;Straw  Medication Administration Whole meds with liquid  Compensations Multiple dry swallows after each bite/sip  Postural Changes Seated upright at 90 degrees      CHL  IP OTHER RECOMMENDATIONS 04/25/2021  Recommended Consults --  Oral Care Recommendations Oral care BID  Other Recommendations --      CHL IP FOLLOW UP RECOMMENDATIONS 04/25/2021  Follow up Recommendations None      No flowsheet data found.         CHL IP ORAL PHASE 04/25/2021  Oral Phase WFL  Oral - Pudding Teaspoon --  Oral - Pudding Cup --  Oral - Honey Teaspoon --  Oral - Honey Cup --  Oral - Nectar Teaspoon --  Oral - Nectar Cup --  Oral - Nectar Straw --  Oral - Thin Teaspoon --  Oral -  Thin Cup --  Oral - Thin Straw --  Oral - Puree --  Oral - Mech Soft --  Oral - Regular --  Oral - Multi-Consistency --  Oral - Pill --  Oral Phase - Comment --    CHL IP PHARYNGEAL PHASE 04/25/2021  Pharyngeal Phase WFL  Pharyngeal- Pudding Teaspoon --  Pharyngeal --  Pharyngeal- Pudding Cup --  Pharyngeal --  Pharyngeal- Honey Teaspoon --  Pharyngeal --  Pharyngeal- Honey Cup --  Pharyngeal --  Pharyngeal- Nectar Teaspoon --  Pharyngeal --  Pharyngeal- Nectar Cup --  Pharyngeal --  Pharyngeal- Nectar Straw --  Pharyngeal --  Pharyngeal- Thin Teaspoon --  Pharyngeal --  Pharyngeal- Thin Cup --  Pharyngeal --  Pharyngeal- Thin Straw --  Pharyngeal --  Pharyngeal- Puree --  Pharyngeal --  Pharyngeal- Mechanical Soft --  Pharyngeal --  Pharyngeal- Regular --  Pharyngeal --  Pharyngeal- Multi-consistency --  Pharyngeal --  Pharyngeal- Pill --  Pharyngeal --  Pharyngeal Comment --     CHL IP CERVICAL ESOPHAGEAL PHASE 04/25/2021  Cervical Esophageal Phase WFL  Pudding Teaspoon --  Pudding Cup --  Honey Teaspoon --  Honey Cup --  Nectar Teaspoon --  Nectar Cup --  Nectar Straw --  Thin Teaspoon --  Thin Cup --  Thin Straw --  Puree --  Mechanical Soft --  Regular --  Multi-consistency --  Pill --  Cervical Esophageal Comment --    Mendy Lapinsky H. Roddie Mc, [place] Speech Language Pathologist  Wende Bushy 04/25/2021, 5:25 PM                         Wende Bushy 04/25/2021, 5:25 PM  [facility] [address] [place], [place], [address] Phone: [phone]   Fax:  [phone]  Name: [name] [name] MRN: [id number] Date of Birth: March 05, 1934

## 2021-05-01 ENCOUNTER — Encounter (HOSPITAL_COMMUNITY)
Admission: RE | Admit: 2021-05-01 | Discharge: 2021-05-01 | Disposition: A | Discharge status: Home / Self Care | Payer: MEDICARE | Source: Ambulatory Visit | Attending: Nephrology | Admitting: Nephrology

## 2021-05-01 ENCOUNTER — Encounter (HOSPITAL_COMMUNITY): Payer: Self-pay

## 2021-05-01 ENCOUNTER — Other Ambulatory Visit: Payer: Self-pay

## 2021-05-01 DIAGNOSIS — N184 Chronic kidney disease, stage 4 (severe): Secondary | ICD-10-CM | POA: Diagnosis not present

## 2021-05-01 LAB — IRON AND TIBC
Iron: 50 ug/dL (ref 45–182)
Saturation Ratios: 20 % (ref 17.9–39.5)
TIBC: 256 ug/dL (ref 250–450)
UIBC: 206 ug/dL

## 2021-05-01 LAB — POCT HEMOGLOBIN-HEMACUE: Hemoglobin: 8.5 g/dL — ABNORMAL LOW (ref 13.0–17.0)

## 2021-05-01 MED ORDER — EPOETIN ALFA 3000 UNIT/ML IJ SOLN
3000.0000 [IU] | Freq: Once | INTRAMUSCULAR | Status: AC
  Administered 2021-05-01: 3000 [IU] via SUBCUTANEOUS
  Filled 2021-05-01: qty 1

## 2021-05-02 LAB — PTH, INTACT AND CALCIUM
Calcium, Total (PTH): 8.5 mg/dL — ABNORMAL LOW (ref 8.6–10.2)
PTH: 60 pg/mL (ref 15–65)

## 2021-05-07 ENCOUNTER — Other Ambulatory Visit: Payer: Self-pay

## 2021-05-07 ENCOUNTER — Ambulatory Visit (HOSPITAL_COMMUNITY)
Admission: RE | Admit: 2021-05-07 | Discharge: 2021-05-07 | Disposition: A | Discharge status: Home / Self Care | Payer: MEDICARE | Source: Ambulatory Visit | Attending: Internal Medicine | Admitting: Internal Medicine

## 2021-05-07 DIAGNOSIS — R0602 Shortness of breath: Secondary | ICD-10-CM | POA: Diagnosis not present

## 2021-05-07 LAB — ECHOCARDIOGRAM COMPLETE
AR max vel: 1.48 cm2
AV Area VTI: 1.52 cm2
AV Area mean vel: 1.44 cm2
AV Mean grad: 11 mmHg
AV Peak grad: 20.6 mmHg
Ao pk vel: 2.27 m/s
Area-P 1/2: 4.06 cm2
S' Lateral: 3.9 cm

## 2021-05-07 NOTE — Progress Notes (Signed)
*  PRELIMINARY RESULTS* Echocardiogram 2D Echocardiogram has been performed.  [name] [name] 05/07/2021, 3:45 PM

## 2021-05-08 DIAGNOSIS — Z299 Encounter for prophylactic measures, unspecified: Secondary | ICD-10-CM | POA: Diagnosis not present

## 2021-05-08 DIAGNOSIS — I270 Primary pulmonary hypertension: Secondary | ICD-10-CM | POA: Diagnosis not present

## 2021-05-08 DIAGNOSIS — M545 Low back pain, unspecified: Secondary | ICD-10-CM | POA: Diagnosis not present

## 2021-05-08 DIAGNOSIS — I10 Essential (primary) hypertension: Secondary | ICD-10-CM | POA: Diagnosis not present

## 2021-05-08 DIAGNOSIS — I5032 Chronic diastolic (congestive) heart failure: Secondary | ICD-10-CM | POA: Diagnosis not present

## 2021-05-10 DIAGNOSIS — I5032 Chronic diastolic (congestive) heart failure: Secondary | ICD-10-CM | POA: Diagnosis not present

## 2021-05-10 DIAGNOSIS — R0602 Shortness of breath: Secondary | ICD-10-CM | POA: Diagnosis not present

## 2021-05-10 DIAGNOSIS — I270 Primary pulmonary hypertension: Secondary | ICD-10-CM | POA: Diagnosis not present

## 2021-05-10 DIAGNOSIS — I10 Essential (primary) hypertension: Secondary | ICD-10-CM | POA: Diagnosis not present

## 2021-05-10 DIAGNOSIS — Z299 Encounter for prophylactic measures, unspecified: Secondary | ICD-10-CM | POA: Diagnosis not present

## 2021-05-12 DIAGNOSIS — E785 Hyperlipidemia, unspecified: Secondary | ICD-10-CM | POA: Diagnosis not present

## 2021-05-12 DIAGNOSIS — N184 Chronic kidney disease, stage 4 (severe): Secondary | ICD-10-CM | POA: Diagnosis not present

## 2021-05-12 DIAGNOSIS — I10 Essential (primary) hypertension: Secondary | ICD-10-CM | POA: Diagnosis not present

## 2021-05-12 DIAGNOSIS — I130 Hypertensive heart and chronic kidney disease with heart failure and stage 1 through stage 4 chronic kidney disease, or unspecified chronic kidney disease: Secondary | ICD-10-CM | POA: Diagnosis not present

## 2021-05-12 DIAGNOSIS — M7989 Other specified soft tissue disorders: Secondary | ICD-10-CM | POA: Diagnosis not present

## 2021-05-12 DIAGNOSIS — I2510 Atherosclerotic heart disease of native coronary artery without angina pectoris: Secondary | ICD-10-CM | POA: Diagnosis not present

## 2021-05-12 DIAGNOSIS — E871 Hypo-osmolality and hyponatremia: Secondary | ICD-10-CM | POA: Diagnosis not present

## 2021-05-12 DIAGNOSIS — Z20822 Contact with and (suspected) exposure to covid-19: Secondary | ICD-10-CM | POA: Diagnosis not present

## 2021-05-12 DIAGNOSIS — I509 Heart failure, unspecified: Secondary | ICD-10-CM | POA: Diagnosis not present

## 2021-05-12 DIAGNOSIS — F32A Depression, unspecified: Secondary | ICD-10-CM | POA: Diagnosis not present

## 2021-05-12 DIAGNOSIS — J449 Chronic obstructive pulmonary disease, unspecified: Secondary | ICD-10-CM | POA: Diagnosis not present

## 2021-05-12 DIAGNOSIS — Z79899 Other long term (current) drug therapy: Secondary | ICD-10-CM | POA: Diagnosis not present

## 2021-05-12 DIAGNOSIS — N185 Chronic kidney disease, stage 5: Secondary | ICD-10-CM | POA: Diagnosis not present

## 2021-05-12 DIAGNOSIS — I132 Hypertensive heart and chronic kidney disease with heart failure and with stage 5 chronic kidney disease, or end stage renal disease: Secondary | ICD-10-CM | POA: Diagnosis not present

## 2021-05-12 DIAGNOSIS — J90 Pleural effusion, not elsewhere classified: Secondary | ICD-10-CM | POA: Diagnosis not present

## 2021-05-12 DIAGNOSIS — I5033 Acute on chronic diastolic (congestive) heart failure: Secondary | ICD-10-CM | POA: Diagnosis not present

## 2021-05-12 DIAGNOSIS — D631 Anemia in chronic kidney disease: Secondary | ICD-10-CM | POA: Diagnosis not present

## 2021-05-12 DIAGNOSIS — I5031 Acute diastolic (congestive) heart failure: Secondary | ICD-10-CM | POA: Diagnosis not present

## 2021-05-12 DIAGNOSIS — R0602 Shortness of breath: Secondary | ICD-10-CM | POA: Diagnosis not present

## 2021-05-12 DIAGNOSIS — J811 Chronic pulmonary edema: Secondary | ICD-10-CM | POA: Diagnosis not present

## 2021-05-12 DIAGNOSIS — I517 Cardiomegaly: Secondary | ICD-10-CM | POA: Diagnosis not present

## 2021-05-15 ENCOUNTER — Encounter (HOSPITAL_COMMUNITY): Admission: RE | Admit: 2021-05-15 | Payer: MEDICARE | Source: Ambulatory Visit

## 2021-05-17 DIAGNOSIS — N184 Chronic kidney disease, stage 4 (severe): Secondary | ICD-10-CM | POA: Diagnosis not present

## 2021-05-17 DIAGNOSIS — F32A Depression, unspecified: Secondary | ICD-10-CM | POA: Diagnosis not present

## 2021-05-17 DIAGNOSIS — I5031 Acute diastolic (congestive) heart failure: Secondary | ICD-10-CM | POA: Diagnosis not present

## 2021-05-17 DIAGNOSIS — I130 Hypertensive heart and chronic kidney disease with heart failure and stage 1 through stage 4 chronic kidney disease, or unspecified chronic kidney disease: Secondary | ICD-10-CM | POA: Diagnosis not present

## 2021-05-17 DIAGNOSIS — Z7982 Long term (current) use of aspirin: Secondary | ICD-10-CM | POA: Diagnosis not present

## 2021-05-17 DIAGNOSIS — Z8546 Personal history of malignant neoplasm of prostate: Secondary | ICD-10-CM | POA: Diagnosis not present

## 2021-05-17 DIAGNOSIS — Z8781 Personal history of (healed) traumatic fracture: Secondary | ICD-10-CM | POA: Diagnosis not present

## 2021-05-17 DIAGNOSIS — E785 Hyperlipidemia, unspecified: Secondary | ICD-10-CM | POA: Diagnosis not present

## 2021-05-17 DIAGNOSIS — D631 Anemia in chronic kidney disease: Secondary | ICD-10-CM | POA: Diagnosis not present

## 2021-05-19 ENCOUNTER — Encounter (HOSPITAL_COMMUNITY): Payer: Self-pay | Admitting: Emergency Medicine

## 2021-05-19 ENCOUNTER — Other Ambulatory Visit: Payer: Self-pay

## 2021-05-19 ENCOUNTER — Inpatient Hospital Stay (HOSPITAL_COMMUNITY)
Admission: EM | Admit: 2021-05-19 | Discharge: 2021-05-21 | DRG: 641 | Disposition: A | Discharge status: Home Health | Payer: MEDICARE | Attending: Internal Medicine | Admitting: Internal Medicine

## 2021-05-19 ENCOUNTER — Emergency Department (HOSPITAL_COMMUNITY): Payer: MEDICARE

## 2021-05-19 DIAGNOSIS — N390 Urinary tract infection, site not specified: Secondary | ICD-10-CM | POA: Diagnosis not present

## 2021-05-19 DIAGNOSIS — Z66 Do not resuscitate: Secondary | ICD-10-CM | POA: Diagnosis present

## 2021-05-19 DIAGNOSIS — N136 Pyonephrosis: Secondary | ICD-10-CM | POA: Diagnosis present

## 2021-05-19 DIAGNOSIS — J9811 Atelectasis: Secondary | ICD-10-CM | POA: Diagnosis present

## 2021-05-19 DIAGNOSIS — I5032 Chronic diastolic (congestive) heart failure: Secondary | ICD-10-CM | POA: Diagnosis present

## 2021-05-19 DIAGNOSIS — N189 Chronic kidney disease, unspecified: Secondary | ICD-10-CM | POA: Diagnosis present

## 2021-05-19 DIAGNOSIS — Z8546 Personal history of malignant neoplasm of prostate: Secondary | ICD-10-CM

## 2021-05-19 DIAGNOSIS — E785 Hyperlipidemia, unspecified: Secondary | ICD-10-CM | POA: Diagnosis present

## 2021-05-19 DIAGNOSIS — I130 Hypertensive heart and chronic kidney disease with heart failure and stage 1 through stage 4 chronic kidney disease, or unspecified chronic kidney disease: Secondary | ICD-10-CM | POA: Diagnosis present

## 2021-05-19 DIAGNOSIS — I10 Essential (primary) hypertension: Secondary | ICD-10-CM | POA: Diagnosis not present

## 2021-05-19 DIAGNOSIS — N200 Calculus of kidney: Secondary | ICD-10-CM | POA: Diagnosis not present

## 2021-05-19 DIAGNOSIS — F32A Depression, unspecified: Secondary | ICD-10-CM | POA: Diagnosis present

## 2021-05-19 DIAGNOSIS — D631 Anemia in chronic kidney disease: Secondary | ICD-10-CM | POA: Diagnosis present

## 2021-05-19 DIAGNOSIS — R39198 Other difficulties with micturition: Secondary | ICD-10-CM

## 2021-05-19 DIAGNOSIS — Z8249 Family history of ischemic heart disease and other diseases of the circulatory system: Secondary | ICD-10-CM

## 2021-05-19 DIAGNOSIS — Z87891 Personal history of nicotine dependence: Secondary | ICD-10-CM | POA: Diagnosis not present

## 2021-05-19 DIAGNOSIS — N184 Chronic kidney disease, stage 4 (severe): Secondary | ICD-10-CM

## 2021-05-19 DIAGNOSIS — R0602 Shortness of breath: Secondary | ICD-10-CM | POA: Diagnosis not present

## 2021-05-19 DIAGNOSIS — N1330 Unspecified hydronephrosis: Secondary | ICD-10-CM | POA: Diagnosis not present

## 2021-05-19 DIAGNOSIS — K4090 Unilateral inguinal hernia, without obstruction or gangrene, not specified as recurrent: Secondary | ICD-10-CM | POA: Diagnosis not present

## 2021-05-19 DIAGNOSIS — E782 Mixed hyperlipidemia: Secondary | ICD-10-CM | POA: Diagnosis not present

## 2021-05-19 DIAGNOSIS — F419 Anxiety disorder, unspecified: Secondary | ICD-10-CM | POA: Diagnosis present

## 2021-05-19 DIAGNOSIS — R339 Retention of urine, unspecified: Secondary | ICD-10-CM

## 2021-05-19 DIAGNOSIS — R319 Hematuria, unspecified: Secondary | ICD-10-CM | POA: Diagnosis present

## 2021-05-19 DIAGNOSIS — I509 Heart failure, unspecified: Secondary | ICD-10-CM

## 2021-05-19 DIAGNOSIS — N3001 Acute cystitis with hematuria: Secondary | ICD-10-CM | POA: Diagnosis not present

## 2021-05-19 DIAGNOSIS — Z20822 Contact with and (suspected) exposure to covid-19: Secondary | ICD-10-CM | POA: Diagnosis present

## 2021-05-19 DIAGNOSIS — E871 Hypo-osmolality and hyponatremia: Principal | ICD-10-CM

## 2021-05-19 DIAGNOSIS — I700 Atherosclerosis of aorta: Secondary | ICD-10-CM | POA: Diagnosis not present

## 2021-05-19 DIAGNOSIS — M109 Gout, unspecified: Secondary | ICD-10-CM | POA: Diagnosis present

## 2021-05-19 NOTE — Discharge Instructions (Addendum)
Make appointment to follow-up with [facility] keep the Foley catheter in place.  Until they see you.

## 2021-05-19 NOTE — ED Triage Notes (Signed)
Pt c/o difficulty urinating since this AM. States he passed a blood clot earlier today and feels like he has another one. Pt last urinated at 1700 today

## 2021-05-19 NOTE — ED Provider Notes (Signed)
[name] [name]   CSN: [id number] Arrival date & time: 05/19/21  2018     History Chief Complaint  Patient presents with   Urinary Retention    [name] [name] is a 87 y.o. male.  Patient with recent admission at [facility] on June 11 for acute on chronic congestive heart failure.  Patient did state that he had shortness of breath at that time.  It is not the complaint tonight.  The complaint tonight is difficulty urinating since this morning.  He has been passing some clots.  States he last urinated at 1700 today.  But while I was in examining the patient the patient did urinate did pass a clot.  Temp is 98.6 heart rate is 70 respirations 17 blood pressure 147/53 and room air sats is 97%.  Past medical history significant for hypertension prostate cancer with removal and radiation treatment in 1999.  [facility] here in [place] according to patient.  Patient status postimplantation of artificial urinary sphincter.  History of kidney stones must have a history of congestive heart failure based on recent admission.  Patient with chronic kidney disease stage IV.  Patient not on any blood thinners.  Does have an AV fistula left upper extremity.  The patient not started on dialysis.      Past Medical History:  Diagnosis Date   Anemia    Anxiety    Artificial opening care, other    pt states that he has artifical sphincter, unable to have foley cath placed.    Cancer [facility])    prostate ca 1999/removal/ rad tx   Carotid artery occlusion    Depression    DVT (deep venous thrombosis) ([facility])    Gout    History of kidney stones    Hyperlipidemia    Hypertension    Incontinence    Reflux    Renal disorder    Status post implantation of artificial urinary sphincter    Wears dentures    Wears glasses     Patient Active Problem List   Diagnosis Date Noted   Fracture of third lumbar vertebra ([place]) 02/05/2021   Closed  nondisplaced fracture of shaft of fifth metacarpal bone of right hand 01/31/2021   Dislocation of proximal interphalangeal joint of finger 01/31/2021   OAB (overactive bladder) 01/10/2021   Sepsis due to Pseudomonas aeruginosa ([place]) 06/01/2020   Hydronephrosis with infection 05/29/2020   Hyperglycemia 05/29/2020   Acute low back pain 05/26/2020   Anemia in chronic kidney disease 10/21/2019   Other disorders of lipoid metabolism 10/21/2019   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   Syncope 09/07/2018   Acute lower UTI 09/07/2018   ARF (acute renal failure) ([place]) 09/07/2018   Gross hematuria 07/25/2018   Sepsis due to urinary tract infection ([place]) 07/19/2018   Paroxysmal A-fib ([place]) 07/19/2018   Chronic kidney disease, stage IV (severe) ([place]) 07/19/2018   Hyperlipidemia 08/05/2014   Knee effusion, right 04/21/2012   Male erectile dysfunction 12/17/2011   Male urinary stress incontinence 12/17/2011   Essential hypertension 07/25/2011   Anemia 08/27/2010   Proteinuria 08/27/2010   Personal history of prostate cancer 08/27/2010   Urinary incontinence 08/27/2010   CAD (coronary artery disease) 06/14/2010   Carotid artery occlusion 06/14/2010   CLOSED FRACTURE OF ACROMIAL END OF CLAVICLE 04/25/2010    Past Surgical History:  Procedure Laterality Date   AV FISTULA PLACEMENT Left 05/15/2020   Procedure: LEFT FOREARM  ARTERIOVENOUS (AV) GRAFT INSERTION;  Surgeon:  Angelia Mould, MD;  Location: [facility] OR;  Service: Vascular;  Laterality: Left;   CARDIAC CATHETERIZATION     2000-2001   cataracts     CORONARY STENT PLACEMENT     HERNIA REPAIR     MULTIPLE TOOTH EXTRACTIONS     NEPHROSTOMY     radical prostectomy     URINARY SPHINCTER IMPLANT         Family History  Problem Relation Age of Onset   Coronary artery disease Other        family history, male < 41   Arthritis Other        family history   Cancer Other    Kidney disease Other     Social History   Tobacco Use    Smoking status: Former    Packs/day: 2.00    Pack years: 0.00    Types: Cigarettes    Start date: 08/05/1962    Quit date: 12/02/1972    Years since quitting: 48.4   Smokeless tobacco: Never  Vaping Use   Vaping Use: Never used  Substance Use Topics   Alcohol use: No   Drug use: No    Home Medications Prior to Admission medications   Medication Sig Start Date End Date Taking? Authorizing Provider  acetaminophen (TYLENOL) 500 MG tablet Take 1,000 mg by mouth every 8 (eight) hours as needed for mild pain or moderate pain. For headache pain    [provider]  acetic acid 2 % otic solution Place 4 drops into the left ear 3 (three) times daily. 03/08/21   [place], [place], PA-C  ALFALFA PO Take 250 mg by mouth 2 (two) times daily.     [provider]  allopurinol (ZYLOPRIM) 100 MG tablet Take 100 mg by mouth daily.  10/07/18   [provider]  ALPRAZolam Duanne Moron) 0.5 MG tablet Take 1 tablet (0.5 mg total) by mouth 2 (two) times daily as needed for anxiety. 06/06/20   Roxan Hockey, MD  amLODipine (NORVASC) 10 MG tablet Take 10 mg by mouth daily. 03/27/20   [provider]  ARIPiprazole (ABILIFY) 20 MG tablet Take 1 tablet (20 mg total) by mouth daily. 06/06/20   Roxan Hockey, MD  carvedilol (COREG) 3.125 MG tablet Take 6.25 mg by mouth 2 (two) times daily. 04/25/20   [provider]  diphenhydrAMINE (BENADRYL) 2 % cream Apply topically. 07/07/20 07/07/21  [provider]  epoetin alfa (EPOGEN) 3000 UNIT/ML injection Inject 3,000 Units into the skin every 14 (fourteen) days.     [provider]  fish oil-omega-3 fatty acids 1000 MG capsule Take 1 g by mouth daily.    [provider]  furosemide (LASIX) 40 MG tablet Take 40 mg by mouth daily as needed. 03/27/20   [provider]  gabapentin (NEURONTIN) 100 MG capsule Take 1 capsule (100 mg total) by mouth at bedtime. 06/06/20   Roxan Hockey, MD  HYDROcodone-acetaminophen  (NORCO) 5-325 MG tablet Take 1 tablet by mouth every 6 (six) hours as needed for moderate pain. 06/06/20   Roxan Hockey, MD  lansoprazole (PREVACID) 15 MG capsule Take 1 capsule (15 mg total) by mouth 2 (two) times daily before a meal. 06/06/20   Emokpae, Courage, MD  Multiple Vitamin (MULTIVITAMIN WITH MINERALS) TABS tablet Take 1 tablet by mouth daily.    [provider]  ofloxacin (FLOXIN) 0.3 % OTIC solution Place 5 drops into both ears daily. 10/19/18   [provider]  Probiotic Product (RISA-BID  PROBIOTIC) TABS Give 1 tablet by mouth twice a day from 09/14/2018-09/24/2018    [provider]  rosuvastatin (CRESTOR) 10 MG tablet Take 10 mg by mouth daily.    [provider]  sertraline (ZOLOFT) 100 MG tablet Take 1 tablet (100 mg total) by mouth daily. 06/06/20   Roxan Hockey, MD  sodium bicarbonate 650 MG tablet Take 1 tablet (650 mg total) by mouth 3 (three) times daily. 06/06/20   Roxan Hockey, MD  sulfamethoxazole-trimethoprim (BACTRIM DS) 800-160 MG tablet Take 0.5 tablets by mouth 2 (two) times daily. With meals    [provider]  traMADol (ULTRAM) 50 MG tablet Take 1 tablet (50 mg total) by mouth every 6 (six) hours as needed. 05/15/20   Angelia Mould, MD    Allergies    Ivp dye [iodinated diagnostic agents], Nalbuphine, and Codeine  Review of Systems   Review of Systems  Constitutional:  Negative for chills and fever.  HENT:  Negative for ear pain and sore throat.   Eyes:  Negative for pain and visual disturbance.  Respiratory:  Negative for cough and shortness of breath.   Cardiovascular:  Negative for chest pain and palpitations.  Gastrointestinal:  Negative for abdominal pain and vomiting.  Genitourinary:  Positive for difficulty urinating and hematuria. Negative for dysuria.  Musculoskeletal:  Negative for arthralgias and back pain.  Skin:  Negative for color change and rash.  Neurological:  Negative for seizures and  syncope.  All other systems reviewed and are negative.  Physical Exam Updated Vital Signs BP (!) 147/53   Pulse 70   Temp 98.6 F (37 C) (Oral)   Resp 17   Ht 1.727 m (5\' 8" )   Wt 80.3 kg   SpO2 97%   BMI 26.92 kg/m   Physical Exam Vitals and nursing [name] reviewed.  Constitutional:      Appearance: He is well-developed.  HENT:     Head: Normocephalic and atraumatic.  Eyes:     Extraocular Movements: Extraocular movements intact.     Conjunctiva/sclera: Conjunctivae normal.     Pupils: Pupils are equal, round, and reactive to light.  Cardiovascular:     Rate and Rhythm: Normal rate and regular rhythm.     Heart sounds: No murmur heard. Pulmonary:     Effort: Pulmonary effort is normal. No respiratory distress.     Breath sounds: Normal breath sounds. No wheezing, rhonchi or rales.  Abdominal:     General: There is no distension.     Palpations: Abdomen is soft. There is no mass.     Tenderness: There is no abdominal tenderness.  Genitourinary:    Penis: Normal.      Comments: Urethra normal.  Patient did have a blood clot at the tip of the penis.  After he voided.  Scrotum without any swelling or discomfort. Musculoskeletal:        General: No swelling.     Cervical back: Neck supple.  Skin:    General: Skin is warm and dry.  Neurological:     General: No focal deficit present.     Mental Status: He is alert. Mental status is at baseline.     Cranial Nerves: No cranial nerve deficit.     Sensory: No sensory deficit.     Motor: No weakness.    ED Results / Procedures / Treatments   Labs (all labs ordered are listed, but only abnormal results are displayed) Labs Reviewed  URINE CULTURE  URINALYSIS, ROUTINE  W REFLEX MICROSCOPIC  COMPREHENSIVE METABOLIC PANEL  CBC WITH DIFFERENTIAL/PLATELET    EKG None  Radiology No results found.  Procedures Procedures   Medications Ordered in ED Medications - No data to display  ED Course  I have reviewed the  triage vital signs and the nursing notes.  Pertinent labs & imaging results that were available during my care of the patient were reviewed by me and considered in my medical decision making (see chart for details).    MDM Rules/Calculators/A&P                          Patient had bladder scan done which was a little below 400 cc but patient still stated that he felt like he could not void properly and that he did void and passed a clot.  Based on that decided we will put a Foley catheter and he knows he would need to keep that and [facility] here in [place].  Also then was going to get CT scan abdomen to check out any hydronephrosis or any ureter obstruction.  Patient known to have chronic kidney disease stage IV.  We will be checking CBC and renal function.  In addition patient not on any blood thinners.  Patient does have AV fistula left upper extremity.  But has not started dialysis.  Patient without any shortness of breath or anything concerning for his congestive heart failure that he was admitted for on June 11.  Will also be sent for urine culture.  Send him urinalysis looks like there is urinary tract infection.  Sent for culture.  We will give a dose of Rocephin.  Urine cultures pending.  Lecture lites significant for some hyponatremia.  But in May he was 125.  We will give some normal saline fluids at 100 cc an hour.  Patient has CT renal pending.  Patient's renal function is not significantly changed from baseline.  Patient will keep Foley catheter in place until he follows up with urology here in [place].  Final Clinical Impression(s) / ED Diagnoses Final diagnoses:  Difficulty voiding  Stage 4 chronic kidney disease [facility])    Rx / DC Orders ED Discharge Orders     None        Fredia Sorrow, MD 05/20/21 [phone]

## 2021-05-20 ENCOUNTER — Other Ambulatory Visit: Payer: Self-pay

## 2021-05-20 ENCOUNTER — Emergency Department (HOSPITAL_COMMUNITY): Payer: MEDICARE

## 2021-05-20 DIAGNOSIS — Z66 Do not resuscitate: Secondary | ICD-10-CM | POA: Diagnosis present

## 2021-05-20 DIAGNOSIS — N3001 Acute cystitis with hematuria: Secondary | ICD-10-CM | POA: Diagnosis not present

## 2021-05-20 DIAGNOSIS — N390 Urinary tract infection, site not specified: Secondary | ICD-10-CM

## 2021-05-20 DIAGNOSIS — J9811 Atelectasis: Secondary | ICD-10-CM | POA: Diagnosis present

## 2021-05-20 DIAGNOSIS — I5032 Chronic diastolic (congestive) heart failure: Secondary | ICD-10-CM | POA: Diagnosis present

## 2021-05-20 DIAGNOSIS — Z20822 Contact with and (suspected) exposure to covid-19: Secondary | ICD-10-CM | POA: Diagnosis present

## 2021-05-20 DIAGNOSIS — N136 Pyonephrosis: Secondary | ICD-10-CM | POA: Diagnosis present

## 2021-05-20 DIAGNOSIS — F419 Anxiety disorder, unspecified: Secondary | ICD-10-CM | POA: Diagnosis present

## 2021-05-20 DIAGNOSIS — E871 Hypo-osmolality and hyponatremia: Principal | ICD-10-CM

## 2021-05-20 DIAGNOSIS — R339 Retention of urine, unspecified: Secondary | ICD-10-CM

## 2021-05-20 DIAGNOSIS — Z8546 Personal history of malignant neoplasm of prostate: Secondary | ICD-10-CM | POA: Diagnosis not present

## 2021-05-20 DIAGNOSIS — I130 Hypertensive heart and chronic kidney disease with heart failure and stage 1 through stage 4 chronic kidney disease, or unspecified chronic kidney disease: Secondary | ICD-10-CM | POA: Diagnosis present

## 2021-05-20 DIAGNOSIS — R319 Hematuria, unspecified: Secondary | ICD-10-CM | POA: Diagnosis present

## 2021-05-20 DIAGNOSIS — N184 Chronic kidney disease, stage 4 (severe): Secondary | ICD-10-CM

## 2021-05-20 DIAGNOSIS — I509 Heart failure, unspecified: Secondary | ICD-10-CM | POA: Diagnosis not present

## 2021-05-20 DIAGNOSIS — E785 Hyperlipidemia, unspecified: Secondary | ICD-10-CM | POA: Diagnosis present

## 2021-05-20 DIAGNOSIS — E782 Mixed hyperlipidemia: Secondary | ICD-10-CM

## 2021-05-20 DIAGNOSIS — I10 Essential (primary) hypertension: Secondary | ICD-10-CM | POA: Diagnosis not present

## 2021-05-20 DIAGNOSIS — D631 Anemia in chronic kidney disease: Secondary | ICD-10-CM

## 2021-05-20 DIAGNOSIS — Z8249 Family history of ischemic heart disease and other diseases of the circulatory system: Secondary | ICD-10-CM | POA: Diagnosis not present

## 2021-05-20 DIAGNOSIS — M109 Gout, unspecified: Secondary | ICD-10-CM | POA: Diagnosis present

## 2021-05-20 DIAGNOSIS — Z87891 Personal history of nicotine dependence: Secondary | ICD-10-CM | POA: Diagnosis not present

## 2021-05-20 DIAGNOSIS — F32A Depression, unspecified: Secondary | ICD-10-CM | POA: Diagnosis present

## 2021-05-20 LAB — COMPREHENSIVE METABOLIC PANEL
ALT: 15 U/L (ref 0–44)
ALT: 16 U/L (ref 0–44)
AST: 20 U/L (ref 15–41)
AST: 25 U/L (ref 15–41)
Albumin: 3.2 g/dL — ABNORMAL LOW (ref 3.5–5.0)
Albumin: 3.6 g/dL (ref 3.5–5.0)
Alkaline Phosphatase: 58 U/L (ref 38–126)
Alkaline Phosphatase: 68 U/L (ref 38–126)
Anion gap: 10 (ref 5–15)
Anion gap: 9 (ref 5–15)
BUN: 39 mg/dL — ABNORMAL HIGH (ref 8–23)
BUN: 43 mg/dL — ABNORMAL HIGH (ref 8–23)
CO2: 25 mmol/L (ref 22–32)
CO2: 26 mmol/L (ref 22–32)
Calcium: 8.6 mg/dL — ABNORMAL LOW (ref 8.9–10.3)
Calcium: 8.6 mg/dL — ABNORMAL LOW (ref 8.9–10.3)
Chloride: 88 mmol/L — ABNORMAL LOW (ref 98–111)
Chloride: 96 mmol/L — ABNORMAL LOW (ref 98–111)
Creatinine, Ser: 3.04 mg/dL — ABNORMAL HIGH (ref 0.61–1.24)
Creatinine, Ser: 3.33 mg/dL — ABNORMAL HIGH (ref 0.61–1.24)
GFR, Estimated: 18 mL/min — ABNORMAL LOW (ref >60)
GFR, Estimated: 20 mL/min — ABNORMAL LOW (ref >60)
Glucose, Bld: 106 mg/dL — ABNORMAL HIGH (ref 70–99)
Glucose, Bld: 117 mg/dL — ABNORMAL HIGH (ref 70–99)
Potassium: 4 mmol/L (ref 3.5–5.1)
Potassium: 4.2 mmol/L (ref 3.5–5.1)
Sodium: 124 mmol/L — ABNORMAL LOW (ref 135–145)
Sodium: 130 mmol/L — ABNORMAL LOW (ref 135–145)
Total Bilirubin: 0.2 mg/dL — ABNORMAL LOW (ref 0.3–1.2)
Total Bilirubin: 0.3 mg/dL (ref 0.3–1.2)
Total Protein: 6.6 g/dL (ref 6.5–8.1)
Total Protein: 7.2 g/dL (ref 6.5–8.1)

## 2021-05-20 LAB — CBC WITH DIFFERENTIAL/PLATELET
Abs Immature Granulocytes: 0.02 10*3/uL (ref 0.00–0.07)
Basophils Absolute: 0 10*3/uL (ref 0.0–0.1)
Basophils Relative: 1 %
Eosinophils Absolute: 0.6 10*3/uL — ABNORMAL HIGH (ref 0.0–0.5)
Eosinophils Relative: 11 %
HCT: 25.8 % — ABNORMAL LOW (ref 39.0–52.0)
Hemoglobin: 8.7 g/dL — ABNORMAL LOW (ref 13.0–17.0)
Immature Granulocytes: 0 %
Lymphocytes Relative: 14 %
Lymphs Abs: 0.8 10*3/uL (ref 0.7–4.0)
MCH: 31.3 pg (ref 26.0–34.0)
MCHC: 33.7 g/dL (ref 30.0–36.0)
MCV: 92.8 fL (ref 80.0–100.0)
Monocytes Absolute: 0.4 10*3/uL (ref 0.1–1.0)
Monocytes Relative: 8 %
Neutro Abs: 3.8 10*3/uL (ref 1.7–7.7)
Neutrophils Relative %: 66 %
Platelets: 157 10*3/uL (ref 150–400)
RBC: 2.78 MIL/uL — ABNORMAL LOW (ref 4.22–5.81)
RDW: 13.9 % (ref 11.5–15.5)
WBC: 5.6 10*3/uL (ref 4.0–10.5)
nRBC: 0 % (ref 0.0–0.2)

## 2021-05-20 LAB — APTT: aPTT: 34 seconds (ref 24–36)

## 2021-05-20 LAB — PROCALCITONIN: Procalcitonin: <0.1 ng/mL

## 2021-05-20 LAB — BASIC METABOLIC PANEL
Anion gap: 9 (ref 5–15)
BUN: 40 mg/dL — ABNORMAL HIGH (ref 8–23)
CO2: 25 mmol/L (ref 22–32)
Calcium: 8.6 mg/dL — ABNORMAL LOW (ref 8.9–10.3)
Chloride: 96 mmol/L — ABNORMAL LOW (ref 98–111)
Creatinine, Ser: 3.07 mg/dL — ABNORMAL HIGH (ref 0.61–1.24)
GFR, Estimated: 19 mL/min — ABNORMAL LOW (ref >60)
Glucose, Bld: 98 mg/dL (ref 70–99)
Potassium: 3.8 mmol/L (ref 3.5–5.1)
Sodium: 130 mmol/L — ABNORMAL LOW (ref 135–145)

## 2021-05-20 LAB — URINALYSIS, ROUTINE W REFLEX MICROSCOPIC
Bilirubin Urine: NEGATIVE
Glucose, UA: NEGATIVE mg/dL
Ketones, ur: NEGATIVE mg/dL
Nitrite: NEGATIVE
Protein, ur: 30 mg/dL — AB
Specific Gravity, Urine: 1.004 — ABNORMAL LOW (ref 1.005–1.030)
pH: 8 (ref 5.0–8.0)

## 2021-05-20 LAB — CBC
HCT: 24.1 % — ABNORMAL LOW (ref 39.0–52.0)
Hemoglobin: 8.1 g/dL — ABNORMAL LOW (ref 13.0–17.0)
MCH: 31.5 pg (ref 26.0–34.0)
MCHC: 33.6 g/dL (ref 30.0–36.0)
MCV: 93.8 fL (ref 80.0–100.0)
Platelets: 161 10*3/uL (ref 150–400)
RBC: 2.57 MIL/uL — ABNORMAL LOW (ref 4.22–5.81)
RDW: 13.9 % (ref 11.5–15.5)
WBC: 5.1 10*3/uL (ref 4.0–10.5)
nRBC: 0 % (ref 0.0–0.2)

## 2021-05-20 LAB — RESP PANEL BY RT-PCR (FLU A&B, COVID) ARPGX2
Influenza A by PCR: NEGATIVE
Influenza B by PCR: NEGATIVE
SARS Coronavirus 2 by RT PCR: NEGATIVE

## 2021-05-20 LAB — PROTIME-INR
INR: 1.1 (ref 0.8–1.2)
Prothrombin Time: 14.3 seconds (ref 11.4–15.2)

## 2021-05-20 LAB — PHOSPHORUS: Phosphorus: 4.2 mg/dL (ref 2.5–4.6)

## 2021-05-20 LAB — OSMOLALITY
Osmolality: 280 mOsm/kg (ref 275–295)
Osmolality: 286 mOsm/kg (ref 275–295)

## 2021-05-20 LAB — SODIUM, URINE, RANDOM: Sodium, Ur: 41 mmol/L

## 2021-05-20 LAB — OSMOLALITY, URINE: Osmolality, Ur: 161 mOsm/kg — ABNORMAL LOW (ref 300–900)

## 2021-05-20 LAB — CREATININE, URINE, RANDOM: Creatinine, Urine: 26.93 mg/dL

## 2021-05-20 LAB — MAGNESIUM: Magnesium: 1.8 mg/dL (ref 1.7–2.4)

## 2021-05-20 MED ORDER — SODIUM CHLORIDE 0.9 % IV SOLN: INTRAVENOUS | Status: DC

## 2021-05-20 MED ORDER — SODIUM CHLORIDE 0.9 % IV SOLN
1.0000 g | INTRAVENOUS | Status: DC
  Administered 2021-05-21: 1 g via INTRAVENOUS
  Filled 2021-05-20: qty 10

## 2021-05-20 MED ORDER — SODIUM CHLORIDE 0.9 % IV SOLN
1.0000 g | Freq: Once | INTRAVENOUS | Status: AC
  Administered 2021-05-20: 1 g via INTRAVENOUS
  Filled 2021-05-20: qty 10

## 2021-05-20 MED ORDER — CHLORHEXIDINE GLUCONATE CLOTH 2 % EX PADS
6.0000 | MEDICATED_PAD | Freq: Every day | CUTANEOUS | Status: DC
  Administered 2021-05-21: 6 via TOPICAL

## 2021-05-20 MED ORDER — ROSUVASTATIN CALCIUM 10 MG PO TABS
10.0000 mg | ORAL_TABLET | Freq: Every day | ORAL | Status: DC
  Administered 2021-05-20 – 2021-05-21 (×2): 10 mg via ORAL
  Filled 2021-05-20: qty 1

## 2021-05-20 MED ORDER — CARVEDILOL 3.125 MG PO TABS
6.2500 mg | ORAL_TABLET | Freq: Two times a day (BID) | ORAL | Status: DC
  Administered 2021-05-20 – 2021-05-21 (×2): 6.25 mg via ORAL
  Filled 2021-05-20 (×2): qty 2

## 2021-05-20 MED ORDER — PANTOPRAZOLE SODIUM 20 MG PO TBEC
20.0000 mg | DELAYED_RELEASE_TABLET | Freq: Every day | ORAL | Status: DC
  Filled 2021-05-20 (×4): qty 1

## 2021-05-20 MED ORDER — OMEGA-3-ACID ETHYL ESTERS 1 G PO CAPS
1.0000 g | ORAL_CAPSULE | Freq: Every day | ORAL | Status: DC
  Administered 2021-05-20 – 2021-05-21 (×2): 1 g via ORAL
  Filled 2021-05-20 (×5): qty 1

## 2021-05-20 NOTE — ED Provider Notes (Signed)
Care assumed from Dr. Rogene Houston.  Patient with recent admission for heart failure here with difficulty urinating and passing clots.  Foley catheter was placed.  Urinalysis is concerning for infection.  He was given IV fluids and Rocephin.  Creatinine is near baseline. Hyponatremia of 124 was 130 on 6/14. CT scan IMPRESSION:  1. Bilateral hydronephrosis and hydroureter. No obstructing stone or  lesion identified. Bladder decompressed with a Foley catheter.  Appearance is similar to prior study.  2. Mild patchy airspace disease in the right lung, possibly  pneumonia. Peripheral interstitial process in the lung bases may  represent chronic fibrosis or edema.  3. Extensive abdominal aortic atherosclerosis.  4. Surgical absence of the prostate gland. No metastatic disease is  appreciated.   Patient feels improved on recheck. Foley draining. Treat UTI. Culture pending.  CT findings of hydronephrosis appear chronic. Lung findings likely edema.  With sodium of 124, will gently hydrate overnight and give antibiotics. D/w Dr. Josephine Cables.   Ezequiel Essex, MD 05/20/21 [phone]

## 2021-05-20 NOTE — H&P (Addendum)
History and Physical  [name] [name] IEP:[id number] DOB: 02-09-1934 DOA: 05/19/2021  Referring physician: Ezequiel Essex, MD  PCP: Glenda Chroman, MD  Patient coming from: Home  Chief Complaint: Blood clots in urine  HPI: [name] [name] is a 87 y.o. male with medical history significant for chronic kidney disease stage IV-V, hyponatremia, hypertension, GERD, hyperlipidemia and CHF who presents to Emergency department due to difficulty in urination sustained at home yesterday (6/18) in the morning, he states that he passed blood clots while urinating at home, however, in the afternoon, he had difficulty and painful urination which he thought was due to blood clots in his bladder, so he activated EMS and was taken to the ED for further evaluation and management. Patient was recently admitted at [facility] on 6/11 due to CHF which was treated with diuretics.  On presentation to the ED today, he denies chest pain, shortness of breath, nausea, vomiting or abdominal pain.  ED Course:  In the emergency department, hemodynamically stable.  Work-up in the ED showed normocytic anemia, hyponatremia, BUN/creatinine 43/3.33 (baseline creatinine 2.5- 3.2).  Urinalysis was unimpressive for UTI. Chest x-ray showed no evidence of active pulmonary disease but showed chronic fibrosis in the lungs CT abdomen pelvis without contrast showed Bilateral hydronephrosis and hydroureter. No obstructing stone or lesion identified. Bladder decompressed with a Foley catheter. Appearance is similar to prior study. IV ceftriaxone due to presumed UTI was given.  IV hydration provided.  Hospitalist was asked to admit patient for further evaluation and management.  Review of Systems: Constitutional: Negative for chills and fever.  HENT: Negative for ear pain and sore throat.   Eyes: Negative for pain and visual disturbance.  Respiratory: Negative for cough, chest tightness and shortness of breath.    Cardiovascular: Negative for chest pain and palpitations.  Gastrointestinal: Negative for abdominal pain and vomiting.  Endocrine: Negative for polyphagia and polyuria.  Genitourinary: Positive for difficulty in urination and blood clots in urine.   Musculoskeletal: Negative for arthralgias and back pain.  Skin: Negative for color change and rash.  Allergic/Immunologic: Negative for immunocompromised state.  Neurological: Negative for tremors, syncope, speech difficulty Hematological: Does not bruise/bleed easily.  All other systems reviewed and are negative   Past Medical History:  Diagnosis Date   Anemia    Anxiety    Artificial opening care, other    pt states that he has artifical sphincter, unable to have foley cath placed.    Cancer ([place])    prostate ca 1999/removal/ rad tx   Carotid artery occlusion    Depression    DVT (deep venous thrombosis) (HCC)    Gout    History of kidney stones    Hyperlipidemia    Hypertension    Incontinence    Reflux    Renal disorder    Status post implantation of artificial urinary sphincter    Wears dentures    Wears glasses    Past Surgical History:  Procedure Laterality Date   AV FISTULA PLACEMENT Left 05/15/2020   Procedure: LEFT FOREARM  ARTERIOVENOUS (AV) GRAFT INSERTION;  Surgeon: Angelia Mould, MD;  Location: [place];  Service: Vascular;  Laterality: Left;   CARDIAC CATHETERIZATION     2000-2001   cataracts     CORONARY STENT PLACEMENT     HERNIA REPAIR     MULTIPLE TOOTH EXTRACTIONS     NEPHROSTOMY     radical prostectomy     URINARY SPHINCTER IMPLANT  Social History:  reports that he quit smoking about 48 years ago. His smoking use included cigarettes. He started smoking about 58 years ago. He smoked an average of 2.00 packs per day. He has never used smokeless tobacco. He reports that he does not drink alcohol and does not use drugs.   Allergies  Allergen Reactions   Ivp Dye [Iodinated Diagnostic Agents]      Due to partial renal failure   Nalbuphine Other (See Comments)    PATIENT STATES IT MADE HIM FEEL LIKE HE WAS GOING TO DIE. NO SPECIFICS   Codeine Anxiety    Family History  Problem Relation Age of Onset   Coronary artery disease Other        family history, male < 74   Arthritis Other        family history   Cancer Other    Kidney disease Other      Prior to Admission medications   Medication Sig Start Date End Date Taking? Authorizing Provider  acetaminophen (TYLENOL) 500 MG tablet Take 1,000 mg by mouth every 8 (eight) hours as needed for mild pain or moderate pain. For headache pain    [provider]  acetic acid 2 % otic solution Place 4 drops into the left ear 3 (three) times daily. 03/08/21   Wurst, [place], PA-C  ALFALFA PO Take 250 mg by mouth 2 (two) times daily.     [provider]  allopurinol (ZYLOPRIM) 100 MG tablet Take 100 mg by mouth daily.  10/07/18   [provider]  ALPRAZolam Duanne Moron) 0.5 MG tablet Take 1 tablet (0.5 mg total) by mouth 2 (two) times daily as needed for anxiety. 06/06/20   Roxan Hockey, MD  amLODipine (NORVASC) 10 MG tablet Take 10 mg by mouth daily. 03/27/20   [provider]  ARIPiprazole (ABILIFY) 20 MG tablet Take 1 tablet (20 mg total) by mouth daily. 06/06/20   Roxan Hockey, MD  carvedilol (COREG) 3.125 MG tablet Take 6.25 mg by mouth 2 (two) times daily. 04/25/20   [provider]  diphenhydrAMINE (BENADRYL) 2 % cream Apply topically. 07/07/20 07/07/21  [provider]  epoetin alfa (EPOGEN) 3000 UNIT/ML injection Inject 3,000 Units into the skin every 14 (fourteen) days.     [provider]  fish oil-omega-3 fatty acids 1000 MG capsule Take 1 g by mouth daily.    [provider]  furosemide (LASIX) 40 MG tablet Take 40 mg by mouth daily as needed. 03/27/20   [provider]  gabapentin (NEURONTIN) 100 MG capsule Take 1 capsule (100 mg total) by mouth at bedtime.  06/06/20   Roxan Hockey, MD  HYDROcodone-acetaminophen (NORCO) 5-325 MG tablet Take 1 tablet by mouth every 6 (six) hours as needed for moderate pain. 06/06/20   Roxan Hockey, MD  lansoprazole (PREVACID) 15 MG capsule Take 1 capsule (15 mg total) by mouth 2 (two) times daily before a meal. 06/06/20   Emokpae, Courage, MD  Multiple Vitamin (MULTIVITAMIN WITH MINERALS) TABS tablet Take 1 tablet by mouth daily.    [provider]  ofloxacin (FLOXIN) 0.3 % OTIC solution Place 5 drops into both ears daily. 10/19/18   [provider]  Probiotic Product (RISA-BID PROBIOTIC) TABS Give 1 tablet by mouth twice a day from 09/14/2018-09/24/2018    [provider]  rosuvastatin (CRESTOR) 10 MG tablet Take 10 mg by mouth daily.    [provider]  sertraline (ZOLOFT) 100 MG tablet Take 1 tablet (100  mg total) by mouth daily. 06/06/20   Roxan Hockey, MD  sodium bicarbonate 650 MG tablet Take 1 tablet (650 mg total) by mouth 3 (three) times daily. 06/06/20   Roxan Hockey, MD  sulfamethoxazole-trimethoprim (BACTRIM DS) 800-160 MG tablet Take 0.5 tablets by mouth 2 (two) times daily. With meals    [provider]  traMADol (ULTRAM) 50 MG tablet Take 1 tablet (50 mg total) by mouth every 6 (six) hours as needed. 05/15/20   Angelia Mould, MD    Physical Exam: BP (!) 154/58   Pulse 67   Temp 98.6 F (37 C) (Oral)   Resp 14   Ht 5\' 8"  (1.727 m)   Wt 80.3 kg   SpO2 98%   BMI 26.92 kg/m   General: 87 y.o. year-old male well developed well nourished in no acute distress.  Alert and oriented x3. HEENT: NCAT, EOMI Neck: Supple, trachea medial Cardiovascular: Regular rate and rhythm with no rubs or gallops.  No thyromegaly or JVD noted.  No lower extremity edema. 2/4 pulses in all 4 extremities. Respiratory: Clear to auscultation with no wheezes or rales. Good inspiratory effort. Abdomen: Soft, nontender nondistended with normal bowel sounds x4  quadrants. Muskuloskeletal: No cyanosis, clubbing or edema noted bilaterally Neuro: CN II-XII intact, strength 5/5 x 4, sensation, reflexes intact Skin: No ulcerative lesions noted or rashes Psychiatry: Judgement and insight appear normal. Mood is appropriate for condition and setting          Labs on Admission:  Basic Metabolic Panel: Recent Labs  Lab 05/19/21 2339  NA 124*  K 4.2  CL 88*  CO2 26  GLUCOSE 106*  BUN 43*  CREATININE 3.33*  CALCIUM 8.6*   Liver Function Tests: Recent Labs  Lab 05/19/21 2339  AST 25  ALT 16  ALKPHOS 68  BILITOT 0.3  PROT 7.2  ALBUMIN 3.6   No results for input(s): LIPASE, AMYLASE in the last 168 hours. No results for input(s): AMMONIA in the last 168 hours. CBC: Recent Labs  Lab 05/19/21 2339  WBC 5.6  NEUTROABS 3.8  HGB 8.7*  HCT 25.8*  MCV 92.8  PLT 157   Cardiac Enzymes: No results for input(s): CKTOTAL, CKMB, CKMBINDEX, TROPONINI in the last 168 hours.  BNP (last 3 results) No results for input(s): BNP in the last 8760 hours.  ProBNP (last 3 results) No results for input(s): PROBNP in the last 8760 hours.  CBG: No results for input(s): GLUCAP in the last 168 hours.  Radiological Exams on Admission: DG Chest Portable 1 View  Result Date: 05/20/2021 CLINICAL DATA:  Shortness of breath starting yesterday. EXAM: PORTABLE CHEST 1 VIEW COMPARISON:  05/12/2021 FINDINGS: Heart size and pulmonary vascularity are normal for technique. Coarse infiltrates throughout both lungs are unchanged since prior study, likely representing chronic lung disease. No pleural effusions. No pneumothorax. No focal consolidation. Pleural calcifications in the apices. Degenerative changes in the spine and shoulders. Calcification of the aorta. 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 No evidence of active pulmonary disease. Electronically Signed   By: Lucienne Capers M.D.   On: 05/20/2021 01:59   CT RENAL STONE STUDY  Result Date:  05/20/2021 CLINICAL DATA:  Difficulty urinating.  Hematuria. EXAM: CT ABDOMEN AND PELVIS WITHOUT CONTRAST TECHNIQUE: Multidetector CT imaging of the abdomen and pelvis was performed following the standard protocol without IV contrast. COMPARISON:  05/29/2020 FINDINGS: Lower chest: Motion artifact limits evaluation of lung bases but there appears to be patchy airspace infiltration in the  right lung, possibly pneumonia. Peripheral interstitial process, likely fibrosis or edema, in the lung bases. Minimal pleural effusions. Cardiac enlargement. Hepatobiliary: No focal liver abnormality is seen. No gallstones, gallbladder wall thickening, or biliary dilatation. Pancreas: Unremarkable. No pancreatic ductal dilatation or surrounding inflammatory changes. Spleen: Normal in size without focal abnormality. Adrenals/Urinary Tract: No adrenal gland nodules. Punctate stone in the upper pole of the right kidney. Mild bilateral hydronephrosis and hydroureter. No ureteral stones. Bladder is decompressed with a Foley catheter. Appearance is unchanged since prior study. Stomach/Bowel: The stomach, small bowel, and colon are not abnormally distended. No wall thickening or inflammatory changes are appreciated. Diverticula in the sigmoid colon without evidence of diverticulitis. Appendix is normal. Vascular/Lymphatic: Aortic atherosclerosis. No enlarged abdominal or pelvic lymph nodes. 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 Surgical clips in the pelvis. No significant pelvic lymphadenopathy. Other: No free air or free fluid in the abdomen. Small left inguinal hernia containing fat. Musculoskeletal: Compression of L3, unchanged. No destructive bone lesions. IMPRESSION: 1. Bilateral hydronephrosis and hydroureter. No obstructing stone or lesion identified. Bladder decompressed with a Foley catheter. Appearance is similar to prior study. 2. Mild patchy airspace disease in the right lung, possibly pneumonia. Peripheral  interstitial process in the lung bases may represent chronic fibrosis or edema. 3. Extensive abdominal aortic atherosclerosis. 4. Surgical absence of the prostate gland. No metastatic disease is appreciated. Electronically Signed   By: Lucienne Capers M.D.   On: 05/20/2021 00:50    EKG: I independently viewed the EKG done and my findings are as followed: EKG was not done in the ED  Assessment/Plan Present on Admission:  Hyponatremia  Acute lower UTI  Chronic kidney disease, stage IV (severe) ([place])  Essential hypertension  Hyperlipidemia  Anemia in chronic kidney disease  Principal Problem:   Hyponatremia Active Problems:   Essential hypertension   Hyperlipidemia   Chronic kidney disease, stage IV (severe) ([facility])   Acute lower UTI   Anemia in chronic kidney disease   CHF (congestive heart failure) ([facility])   Acute on chronic hyponatremia Na 124; this was 125 on 04/17/2021 This may due to diuretic effect, considering recent Lasix treatment due to CHF Continue gentle hydration Continue to monitor sodium with serial BMPs Urine osmolality, serum osmolality and urine sodium will be checked  UTI POA CT abdomen and pelvis without contrast showed CT abdomen pelvis without contrast showed Bilateral hydronephrosis and hydroureter Patient complained of dysuria and hematuria CT abdomen and pelvis without contrast showed bilateral hydronephrosis and hydroureter with no obstructing stone or lesion identified She was started on IV ceftriaxone, we shall continue same at this time Urine culture pending  Questionable pneumonia CT abdomen and pelvis was suggestive of right lung pneumonia Patient was afebrile, denies shortness of breath, cough, fever, WBC was within normal range Procalcitonin will be checked prior to starting antibiotics  Chronic kidney disease stage IV BUN/creatinine 43/3.33 (baseline creatinine 2.5- 3.2 Continue gentle hydration Renally adjust medications, avoid nephrotoxic  agents/dehydration/hypotension  Congestive heart failure Patient was recently treated for UTI, EKG done on 6/6 showed LVEF of 55 to 60%.  There is moderately elevated pulmonary artery systolic pressure. Continue Coreg, rosuvastatin Lasix temporarily held due to hyponatremia  Essential hypertension Continue Coreg, Norvasc  Hyperlipidemia Continue on Crestor and omega-3 fatty acids  DVT prophylaxis: SCDs (consider initiating chemoprophylaxis if hematuria does not recur)  Code Status: Full code  Family Communication: None at Bedside  Disposition Plan:  Patient is from:  home Anticipated DC to:                   SNF or family members home Anticipated DC date:               2-3 days Anticipated DC barriers:         patient requires inpatient management continue to hyponatremia    Consults called: None  Admission status: Inpatient    Bernadette Hoit MD [facility]  05/20/2021, 4:01 AM

## 2021-05-20 NOTE — Progress Notes (Signed)
PROGRESS NOTE  [name] [name] YJE:[id number] DOB: 1934-02-25 DOA: 05/19/2021 PCP: Glenda Chroman, MD  Brief History:  87 year old male with a history of CKD stage IV, hypertension, GERD, diastolic CHF, depression presenting with hematuria and difficulty urinating.  The patient states that this occurred on the morning of 05/19/2021 after he passed a blood clot from his bladder.  He denies any fevers, chills, headache, chest pain, shortness of breath, nausea, vomiting, diarrhea, abdominal pain.  Notably, the patient was recently hospitalized from 05/12/2021 to 05/15/2021 at [facility].  He was discharged home with instructions to take furosemide 20 mg only as needed. He has been weighing himself daily, and states that he has gained 3 pounds from 164 to 167 pounds.  Therefore, he took 1 dose of furosemide 20 mg before he came to the emergency department on 05/19/2021.  Appetite has been good; however, the patient has been eating fast food on a daily basis for breakfast. In the emergency department, the patient had difficulty urinating.  Bladder scan revealed significant mount of urine.  Therefore, a Foley catheter was placed draining 400 cc.  The patient sodium was noted to be 124.  This was lower than usual.  As result, the patient was admitted for further evaluation and treatment.  The patient was otherwise afebrile hemodynamically stable with oxygen saturation 90-99% on room air.  BMP showed sodium 124, potassium 4.2, serum creatinine 3.33.  LFTs were unremarkable.  WBC 5.6, hemoglobin 8.7, platelets 157,000.  PCT was negative.  UA showed 21-50 WBC, 21-50 RBC.  CT abdomen pelvis without contrast showed Bilateral hydronephrosis and hydroureter. No obstructing stone or lesion identified. Bladder decompressed with a Foley catheter. Appearance is similar to prior study.  Assessment/Plan: Acute on chronic hyponatremia -May be due to diuresis -Patient was started on IV normal saline -Repeat sodium -Urine  osmolarity -Serum osmolarity -Urine sodium -Urine creatinine -Results may be altered secondary to patient receiving furosemide prior to admission  Pyuria/hematuria/hydronephrosis -Follow-up culture -Continue IV ceftriaxone -Maintain Foley catheter for now -Possible voiding trial in 24 hours -Patient had noted history of hydronephrosis seen on CT 05/19/2020  CKD stage IV -Baseline creatinine 2.8-3.1 -Continue bicarbonate -A.m. BMP  Chronic diastolic CHF -He appears clinically euvolemic -Monitor closely while on IV fluids -Continue carvedilol -05/07/2021 echo EF 55-60%, mild elevated PASP, mild MR  Pulmonary opacity -likely atelectasis -PCT<0.10 -no fever or leukocytosis or dyspnea  Essential hypertension -Continue carvedilol  Hyperlipidemia -Continue statin  Depression/anxiety -Continue alprazolam, Abilify, Zoloft      Status is: Inpatient  Remains inpatient appropriate because:Persistent severe electrolyte disturbances  Dispo: The patient is from: Home              Anticipated d/c is to: Home              Patient currently is not medically stable to d/c.   Difficult to place patient No        Family Communication:  caregiver updated at bedside 6/19  Consultants:  none  Code Status:  FULL   DVT Prophylaxis: [place] Lovenox   Procedures: As Listed in Progress Note Above  Antibiotics: Ceftriaxone 6/19>>      Subjective:  Patient denies fevers, chills, headache, chest pain, dyspnea, nausea, vomiting, diarrhea, abdominal pain, dysuria, hematuria, hematochezia, and melena.  Objective: Vitals:   05/20/21 0800 05/20/21 0830 05/20/21 0900 05/20/21 0930  BP: (!) 142/48 (!) 128/49 (!) 140/52 (!) 146/48  Pulse: 60 87 61 63  Resp:    18  Temp:      TempSrc:      SpO2: 99% 100% 98% 96%  Weight:      Height:        Intake/Output Summary (Last 24 hours) at 05/20/2021 1152 Last data filed at 05/20/2021 0426 Gross per 24 hour  Intake 94.76 ml   Output 400 ml  Net -305.24 ml   Weight change:  Exam:  General:  Pt is alert, follows commands appropriately, not in acute distress HEENT: No icterus, No thrush, No neck mass, [place]/AT Cardiovascular: RRR, S1/S2, no rubs, no gallops Respiratory: bibasilar rales R>L.  No wheeze Abdomen: Soft/+BS, non tender, non distended, no guarding Extremities: trace LE edema, No lymphangitis, No petechiae, No rashes, no synovitis   Data Reviewed: I have personally reviewed following labs and imaging studies Basic Metabolic Panel: Recent Labs  Lab 05/19/21 2339  NA 124*  K 4.2  CL 88*  CO2 26  GLUCOSE 106*  BUN 43*  CREATININE 3.33*  CALCIUM 8.6*   Liver Function Tests: Recent Labs  Lab 05/19/21 2339  AST 25  ALT 16  ALKPHOS 68  BILITOT 0.3  PROT 7.2  ALBUMIN 3.6   No results for input(s): LIPASE, AMYLASE in the last 168 hours. No results for input(s): AMMONIA in the last 168 hours. Coagulation Profile: No results for input(s): INR, PROTIME in the last 168 hours. CBC: Recent Labs  Lab 05/19/21 2339  WBC 5.6  NEUTROABS 3.8  HGB 8.7*  HCT 25.8*  MCV 92.8  PLT 157   Cardiac Enzymes: No results for input(s): CKTOTAL, CKMB, CKMBINDEX, TROPONINI in the last 168 hours. BNP: Invalid input(s): POCBNP CBG: No results for input(s): GLUCAP in the last 168 hours. HbA1C: No results for input(s): HGBA1C in the last 72 hours. Urine analysis:    Component Value Date/Time   COLORURINE YELLOW 05/19/2021 2357   APPEARANCEUR CLEAR 05/19/2021 2357   LABSPEC 1.004 (L) 05/19/2021 2357   PHURINE 8.0 05/19/2021 2357   GLUCOSEU NEGATIVE 05/19/2021 2357   HGBUR LARGE (A) 05/19/2021 2357   BILIRUBINUR NEGATIVE 05/19/2021 2357   KETONESUR NEGATIVE 05/19/2021 2357   PROTEINUR 30 (A) 05/19/2021 2357   UROBILINOGEN 0.2 03/14/2010 0809   NITRITE NEGATIVE 05/19/2021 2357   LEUKOCYTESUR MODERATE (A) 05/19/2021 2357   Sepsis Labs: @LABRCNTIP (procalcitonin:4,lacticidven:4) ) Recent  Results (from the past 240 hour(s))  Resp Panel by RT-PCR (Flu A&B, Covid) Nasopharyngeal Swab     Status: None   Collection Time: 05/20/21  3:20 AM   Specimen: Nasopharyngeal Swab; Nasopharyngeal(NP) swabs in vial transport medium  Result Value Ref Range Status   SARS Coronavirus 2 by RT PCR NEGATIVE NEGATIVE Final    Comment: (NOTE) SARS-CoV-2 target nucleic acids are NOT DETECTED.  The SARS-CoV-2 RNA is generally detectable in upper respiratory specimens during the acute phase of infection. The lowest concentration of SARS-CoV-2 viral copies this assay can detect is 138 copies/mL. A negative result does not preclude SARS-Cov-2 infection and should not be used as the sole basis for treatment or other patient management decisions. A negative result may occur with  improper specimen collection/handling, submission of specimen other than nasopharyngeal swab, presence of viral mutation(s) within the areas targeted by this assay, and inadequate number of viral copies(<138 copies/mL). A negative result must be combined with clinical observations, patient history, and epidemiological information. The expected result is Negative.  Fact Sheet for Patients:  EntrepreneurPulse.com.au  Fact Sheet for Healthcare Providers:  IncredibleEmployment.be  This test is  no t yet approved or cleared by the [place] and  has been authorized for detection and/or diagnosis of SARS-CoV-2 by FDA under an Emergency Use Authorization (EUA). This EUA will remain  in effect (meaning this test can be used) for the duration of the COVID-19 declaration under Section 564(b)(1) of the Act, 21 U.S.C.section 360bbb-3(b)(1), unless the authorization is terminated  or revoked sooner.       Influenza A by PCR NEGATIVE NEGATIVE Final   Influenza B by PCR NEGATIVE NEGATIVE Final    Comment: (NOTE) The Xpert Xpress SARS-CoV-2/FLU/RSV plus assay is intended as an aid in the  diagnosis of influenza from Nasopharyngeal swab specimens and should not be used as a sole basis for treatment. Nasal washings and aspirates are unacceptable for Xpert Xpress SARS-CoV-2/FLU/RSV testing.  Fact Sheet for Patients: EntrepreneurPulse.com.au  Fact Sheet for Healthcare Providers: IncredibleEmployment.be  This test is not yet approved or cleared by the [place] FDA and has been authorized for detection and/or diagnosis of SARS-CoV-2 by FDA under an Emergency Use Authorization (EUA). This EUA will remain in effect (meaning this test can be used) for the duration of the COVID-19 declaration under Section 564(b)(1) of the Act, 21 U.S.C. section 360bbb-3(b)(1), unless the authorization is terminated or revoked.  Performed at [facility], [address], [place], [place] [address]      Scheduled Meds: Continuous Infusions:  sodium chloride 100 mL/hr at 05/20/21 0124   [START ON 05/21/2021] cefTRIAXone (ROCEPHIN)  IV      Procedures/Studies: DG OP Swallowing Func-Medicare/Speech Path  Result Date: 04/25/2021 Objective Swallowing Evaluation: Type of Study: MBS-Modified Barium Swallow Study  Patient Details Name: [name] [name] MRN: [id number] Date of Birth: 07-21-1934 Today's Date: 04/25/2021 Time: No data recorded-No data recorded No data recorded Past Medical History: Past Medical History: Diagnosis Date  Anemia   Anxiety   Artificial opening care, other   pt states that he has artifical sphincter, unable to have foley cath placed.   Cancer ([place])   prostate ca 1999/removal/ rad tx  Carotid artery occlusion   Depression   DVT (deep venous thrombosis) (HCC)   Gout   History of kidney stones   Hyperlipidemia   Hypertension   Incontinence   Reflux   Renal disorder   Status post implantation of artificial urinary sphincter   Wears dentures   Wears glasses  Past Surgical History: Past Surgical History: Procedure Laterality Date  AV FISTULA  PLACEMENT Left 05/15/2020  Procedure: LEFT FOREARM  ARTERIOVENOUS (AV) GRAFT INSERTION;  Surgeon: Angelia Mould, MD;  Location: [facility];  Service: Vascular;  Laterality: Left;  CARDIAC CATHETERIZATION    2000-2001  cataracts    CORONARY STENT PLACEMENT    HERNIA REPAIR    MULTIPLE TOOTH EXTRACTIONS    NEPHROSTOMY    radical prostectomy    URINARY SPHINCTER IMPLANT   HPI: Mr Hillyard is a 87yo who was referred for an MBSS in order to objectively assess the swallowing function; Pt reports he gets 'strangled' about "once a week" on water. Pt with hx of reflux and wears dentures.  No data recorded Assessment / Plan / Recommendation CHL IP CLINICAL IMPRESSIONS 04/25/2021 Clinical Impression Pt's oropharyngeal swallowing was noted to be grossly [facility] today. No penetration or aspiration was visualized. Note premature spillage to the level of the pyriforms, however laryngeal vestibule closure was adequate and hyolaryngeal excursion was good. Consistent min residue noted in the valleculae and pyriforms with thin liquids that was cleared with  a cued or reflexive repeat swallow. Note prominent CP. Pt reports his "choking" happens with thin liquids when drinking from a bottle. SLP provided recommendation for Pt to pour bottled water into a cup and consume with a straw or via cup -- Question if Pt tilts his head back and consumes multiple consecutive swallows with a bottle. Further recommend continue with regular diet and thin liquids; meds are ok whole with liquids. Recommend Pt produce an additional dry swallow after the initial swallow. There is no ST f/u needed at this time. Thank you, SLP Visit Diagnosis Dysphagia, unspecified (R13.10) Attention and concentration deficit following -- Frontal lobe and executive function deficit following -- Impact on safety and function Mild aspiration risk   CHL IP TREATMENT RECOMMENDATION 04/25/2021 Treatment Recommendations No treatment recommended at this time   No flowsheet data found.  CHL IP DIET RECOMMENDATION 04/25/2021 SLP Diet Recommendations Regular solids;Thin liquid Liquid Administration via Cup;Straw Medication Administration Whole meds with liquid Compensations Multiple dry swallows after each bite/sip Postural Changes Seated upright at 90 degrees   CHL IP OTHER RECOMMENDATIONS 04/25/2021 Recommended Consults -- Oral Care Recommendations Oral care BID Other Recommendations --   CHL IP FOLLOW UP RECOMMENDATIONS 04/25/2021 Follow up Recommendations None   No flowsheet data found.     CHL IP ORAL PHASE 04/25/2021 Oral Phase WFL Oral - Pudding Teaspoon -- Oral - Pudding Cup -- Oral - Honey Teaspoon -- Oral - Honey Cup -- Oral - Nectar Teaspoon -- Oral - Nectar Cup -- Oral - Nectar Straw -- Oral - Thin Teaspoon -- Oral - Thin Cup -- Oral - Thin Straw -- Oral - Puree -- Oral - Mech Soft -- Oral - Regular -- Oral - Multi-Consistency -- Oral - Pill -- Oral Phase - Comment --  CHL IP PHARYNGEAL PHASE 04/25/2021 Pharyngeal Phase WFL Pharyngeal- Pudding Teaspoon -- Pharyngeal -- Pharyngeal- Pudding Cup -- Pharyngeal -- Pharyngeal- Honey Teaspoon -- Pharyngeal -- Pharyngeal- Honey Cup -- Pharyngeal -- Pharyngeal- Nectar Teaspoon -- Pharyngeal -- Pharyngeal- Nectar Cup -- Pharyngeal -- Pharyngeal- Nectar Straw -- Pharyngeal -- Pharyngeal- Thin Teaspoon -- Pharyngeal -- Pharyngeal- Thin Cup -- Pharyngeal -- Pharyngeal- Thin Straw -- Pharyngeal -- Pharyngeal- Puree -- Pharyngeal -- Pharyngeal- Mechanical Soft -- Pharyngeal -- Pharyngeal- Regular -- Pharyngeal -- Pharyngeal- Multi-consistency -- Pharyngeal -- Pharyngeal- Pill -- Pharyngeal -- Pharyngeal Comment --  CHL IP CERVICAL ESOPHAGEAL PHASE 04/25/2021 Cervical Esophageal Phase WFL Pudding Teaspoon -- Pudding Cup -- Honey Teaspoon -- Honey Cup -- Nectar Teaspoon -- Nectar Cup -- Nectar Straw -- Thin Teaspoon -- Thin Cup -- Thin Straw -- Puree -- Mechanical Soft -- Regular -- Multi-consistency -- Pill -- Cervical Esophageal Comment -- Amelia H. Roddie Mc, CCC-SLP Speech Language Pathologist Wende Bushy 04/25/2021, 5:27 PM            CLINICAL DATA:  Dysphagia aspiration, shortness of breath, get strangled on liquids EXAM: MODIFIED BARIUM SWALLOW TECHNIQUE: Different consistencies of barium were administered orally to the patient by the Speech Pathologist. Imaging of the pharynx was performed in the lateral projection. The radiologist was present in the fluoroscopy room for this study, providing personal supervision. FLUOROSCOPY TIME:  Fluoroscopy Time:  2 minutes 54 seconds Radiation Exposure Index (if provided by the fluoroscopic device): 25.3 mGy Number of Acquired Spot Images: multiple fluoroscopic screen captures COMPARISON:  None FINDINGS: With thin barium by teaspoon, cup, and straw, no laryngeal penetration or aspiration were seen. Intermittent premature spillover of contrast to the vallecula and piriform sinuses.  Mild vallecular and piriform sinus residuals were intermittently visualized, cleared by a requested second swallow. Applesauce and cracker consistency were swallowed without abnormality. Patient swallowed a 12.5 mm diameter barium tablet with thin barium. Tablet passed to the stomach without esophageal obstruction. Mild vallecular piriform sinus residuals of the thin barium were noted. IMPRESSION: Mild swallowing dysfunction as above. Please refer to the Speech Pathologists report for complete details and recommendations. Electronically Signed   By: Lavonia Dana M.D.   On: 04/25/2021 14:38   DG Chest Portable 1 View  Result Date: 05/20/2021 CLINICAL DATA:  Shortness of breath starting yesterday. EXAM: PORTABLE CHEST 1 VIEW COMPARISON:  05/12/2021 FINDINGS: Heart size and pulmonary vascularity are normal for technique. Coarse infiltrates throughout both lungs are unchanged since prior study, likely representing chronic lung disease. No pleural effusions. No pneumothorax. No focal consolidation. Pleural calcifications in the apices.  Degenerative changes in the spine and shoulders. Calcification of the aorta. 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 No evidence of active pulmonary disease. Electronically Signed   By: Lucienne Capers M.D.   On: 05/20/2021 01:59   ECHOCARDIOGRAM COMPLETE  Result Date: 05/07/2021    ECHOCARDIOGRAM REPORT   Patient Name:   [name] [name] Date of Exam: 05/07/2021 Medical Rec #:  [id number]       Height:       68.0 in Accession #:    [id number]      Weight:       177.0 lb Date of Birth:  1934/03/10      BSA:          1.940 m Patient Age:    65 years        BP:           157/70 mmHg Patient Gender: M               HR:           67 bpm. Exam Location:  [facility] Procedure: 2D Echo, Cardiac Doppler and Color Doppler Indications:    R06.02 (ICD-10-CM) - SOB (shortness of breath)  History:        Patient has prior history of Echocardiogram examinations, most                 recent 09/08/2018. Risk Factors:Dyslipidemia and Hypertension.                 CAROTID ARTERY DISEASE. Paroxysmal A-fib.  Sonographer:    Alvino Chapel RCS Referring Phys: [id number] [place]  1. Left ventricular ejection fraction, by estimation, is 55 to 60%. The left ventricle has normal function. The left ventricular internal cavity size was mildly dilated. Left ventricular diastolic parameters were normal.  2. Right ventricular systolic function is normal. The right ventricular size is normal. There is moderately elevated pulmonary artery systolic pressure.  3. Left atrial size was moderately dilated.  4. Right atrial size was mildly dilated.  5. Mild mitral valve regurgitation.  6. AV is thickened, calcified with very mildly restricted motion. Peak and mean gradients through the valve are 21 and 11 mm Hg respectively. Dimensionless index is 0.48 Mild AS.Marland Kitchen Aortic valve regurgitation is not visualized.  7. The inferior vena cava is dilated in size with >50% respiratory variability, suggesting right atrial pressure of 8 mmHg. FINDINGS   Left Ventricle: Left ventricular ejection fraction, by estimation, is 55 to 60%. The left ventricle has normal function. The left ventricular internal cavity size was mildly dilated. There is no  left ventricular hypertrophy. Left ventricular diastolic parameters were normal. Right Ventricle: The right ventricular size is normal. Right vetricular wall thickness was not assessed. Right ventricular systolic function is normal. There is moderately elevated pulmonary artery systolic pressure. The tricuspid regurgitant velocity is  3.47 m/s, and with an assumed right atrial pressure of 8 mmHg, the estimated right ventricular systolic pressure is 40.9 mmHg. Left Atrium: Left atrial size was moderately dilated. Right Atrium: Right atrial size was mildly dilated. Pericardium: Trivial pericardial effusion is present. Mitral Valve: There is mild thickening of the mitral valve leaflet(s). Mild mitral valve regurgitation. Tricuspid Valve: The tricuspid valve is normal in structure. Tricuspid valve regurgitation is mild. Aortic Valve: AV is thickened, calcified with very mildly restricted motion. Peak and mean gradients through the valve are 21 and 11 mm Hg respectively. Dimensionless index is 0.48 Mild AS. Aortic valve regurgitation is not visualized. Aortic valve mean gradient measures 11.0 mmHg. Aortic valve peak gradient measures 20.6 mmHg. Aortic valve area, by VTI measures 1.52 cm. Pulmonic Valve: The pulmonic valve was normal in structure. Pulmonic valve regurgitation is not visualized. Aorta: The aortic root is normal in size and structure. Venous: The inferior vena cava is dilated in size with greater than 50% respiratory variability, suggesting right atrial pressure of 8 mmHg. IAS/Shunts: No atrial level shunt detected by color flow Doppler.  LEFT VENTRICLE PLAX 2D LVIDd:         5.50 cm  Diastology LVIDs:         3.90 cm  LV e' medial:    8.49 cm/s LV PW:         1.10 cm  LV E/e' medial:  10.1 LV IVS:        1.00 cm   LV e' lateral:   11.10 cm/s LVOT diam:     2.00 cm  LV E/e' lateral: 7.7 LV SV:         85 LV SV Index:   44 LVOT Area:     3.14 cm  RIGHT VENTRICLE RV S prime:     14.30 cm/s TAPSE (M-mode): 3.2 cm LEFT ATRIUM             Index       RIGHT ATRIUM           Index LA diam:        4.20 cm 2.16 cm/m  RA Area:     24.00 cm LA Vol (A2C):   74.9 ml 38.60 ml/m RA Volume:   79.30 ml  40.87 ml/m LA Vol (A4C):   77.4 ml 39.89 ml/m LA Biplane Vol: 81.3 ml 41.90 ml/m  AORTIC VALVE AV Area (Vmax):    1.48 cm AV Area (Vmean):   1.44 cm AV Area (VTI):     1.52 cm AV Vmax:           227.00 cm/s AV Vmean:          160.000 cm/s AV VTI:            0.557 m AV Peak Grad:      20.6 mmHg AV Mean Grad:      11.0 mmHg LVOT Vmax:         107.00 cm/s LVOT Vmean:        73.200 cm/s LVOT VTI:          0.269 m LVOT/AV VTI ratio: 0.48  AORTA Ao Root diam: 3.40 cm MITRAL VALVE  TRICUSPID VALVE MV Area (PHT): 4.06 cm    TR Peak grad:   48.2 mmHg MV Decel Time: 187 msec    TR Vmax:        347.00 cm/s MV E velocity: 85.90 cm/s MV A velocity: 81.00 cm/s  SHUNTS MV E/A ratio:  1.06        Systemic VTI:  0.27 m                            Systemic Diam: 2.00 cm Dorris Carnes MD Electronically signed by Dorris Carnes MD Signature Date/Time: 05/07/2021/5:43:00 PM    Final    CT RENAL STONE STUDY  Result Date: 05/20/2021 CLINICAL DATA:  Difficulty urinating.  Hematuria. EXAM: CT ABDOMEN AND PELVIS WITHOUT CONTRAST TECHNIQUE: Multidetector CT imaging of the abdomen and pelvis was performed following the standard protocol without IV contrast. COMPARISON:  05/29/2020 FINDINGS: Lower chest: Motion artifact limits evaluation of lung bases but there appears to be patchy airspace infiltration in the right lung, possibly pneumonia. Peripheral interstitial process, likely fibrosis or edema, in the lung bases. Minimal pleural effusions. Cardiac enlargement. Hepatobiliary: No focal liver abnormality is seen. No gallstones, gallbladder wall  thickening, or biliary dilatation. Pancreas: Unremarkable. No pancreatic ductal dilatation or surrounding inflammatory changes. Spleen: Normal in size without focal abnormality. Adrenals/Urinary Tract: No adrenal gland nodules. Punctate stone in the upper pole of the right kidney. Mild bilateral hydronephrosis and hydroureter. No ureteral stones. Bladder is decompressed with a Foley catheter. Appearance is unchanged since prior study. Stomach/Bowel: The stomach, small bowel, and colon are not abnormally distended. No wall thickening or inflammatory changes are appreciated. Diverticula in the sigmoid colon without evidence of diverticulitis. Appendix is normal. Vascular/Lymphatic: Aortic atherosclerosis. No enlarged abdominal or pelvic lymph nodes. 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 Surgical clips in the pelvis. No significant pelvic lymphadenopathy. Other: No free air or free fluid in the abdomen. Small left inguinal hernia containing fat. Musculoskeletal: Compression of L3, unchanged. No destructive bone lesions. IMPRESSION: 1. Bilateral hydronephrosis and hydroureter. No obstructing stone or lesion identified. Bladder decompressed with a Foley catheter. Appearance is similar to prior study. 2. Mild patchy airspace disease in the right lung, possibly pneumonia. Peripheral interstitial process in the lung bases may represent chronic fibrosis or edema. 3. Extensive abdominal aortic atherosclerosis. 4. Surgical absence of the prostate gland. No metastatic disease is appreciated. Electronically Signed   By: Lucienne Capers M.D.   On: 05/20/2021 00:50    Orson Eva, DO  [facility]  If 7PM-7AM, please contact night-coverage [email or web address] Password [email or web address] 05/20/2021, 11:52 AM   LOS: 0 days

## 2021-05-21 ENCOUNTER — Encounter (HOSPITAL_COMMUNITY): Payer: Self-pay | Admitting: Internal Medicine

## 2021-05-21 DIAGNOSIS — N3001 Acute cystitis with hematuria: Secondary | ICD-10-CM

## 2021-05-21 DIAGNOSIS — E871 Hypo-osmolality and hyponatremia: Secondary | ICD-10-CM | POA: Diagnosis not present

## 2021-05-21 DIAGNOSIS — I10 Essential (primary) hypertension: Secondary | ICD-10-CM | POA: Diagnosis not present

## 2021-05-21 DIAGNOSIS — N184 Chronic kidney disease, stage 4 (severe): Secondary | ICD-10-CM | POA: Diagnosis not present

## 2021-05-21 LAB — BASIC METABOLIC PANEL
Anion gap: 8 (ref 5–15)
BUN: 38 mg/dL — ABNORMAL HIGH (ref 8–23)
CO2: 25 mmol/L (ref 22–32)
Calcium: 8.5 mg/dL — ABNORMAL LOW (ref 8.9–10.3)
Chloride: 98 mmol/L (ref 98–111)
Creatinine, Ser: 3.21 mg/dL — ABNORMAL HIGH (ref 0.61–1.24)
GFR, Estimated: 18 mL/min — ABNORMAL LOW (ref >60)
Glucose, Bld: 91 mg/dL (ref 70–99)
Potassium: 4 mmol/L (ref 3.5–5.1)
Sodium: 131 mmol/L — ABNORMAL LOW (ref 135–145)

## 2021-05-21 LAB — CBC
HCT: 22.9 % — ABNORMAL LOW (ref 39.0–52.0)
Hemoglobin: 7.5 g/dL — ABNORMAL LOW (ref 13.0–17.0)
MCH: 31.1 pg (ref 26.0–34.0)
MCHC: 32.8 g/dL (ref 30.0–36.0)
MCV: 95 fL (ref 80.0–100.0)
Platelets: 146 10*3/uL — ABNORMAL LOW (ref 150–400)
RBC: 2.41 MIL/uL — ABNORMAL LOW (ref 4.22–5.81)
RDW: 13.7 % (ref 11.5–15.5)
WBC: 6.2 10*3/uL (ref 4.0–10.5)
nRBC: 0 % (ref 0.0–0.2)

## 2021-05-21 LAB — URINE CULTURE: Culture: 30000 — AB

## 2021-05-21 MED ORDER — CEFDINIR 300 MG PO CAPS: 300.0000 mg | ORAL_CAPSULE | Freq: Every day | ORAL | 0 refills | Status: DC

## 2021-05-21 MED ORDER — CEFDINIR 300 MG PO CAPS: 300.0000 mg | ORAL_CAPSULE | Freq: Every day | ORAL | Status: DC

## 2021-05-21 MED ORDER — ONDANSETRON HCL 4 MG/2ML IJ SOLN: 4.0000 mg | Freq: Four times a day (QID) | INTRAMUSCULAR | Status: DC | PRN

## 2021-05-21 MED ORDER — ALPRAZOLAM 0.5 MG PO TABS: 0.5000 mg | ORAL_TABLET | Freq: Two times a day (BID) | ORAL | Status: DC | PRN

## 2021-05-21 MED ORDER — ACETAMINOPHEN 325 MG PO TABS: 650.0000 mg | ORAL_TABLET | Freq: Four times a day (QID) | ORAL | Status: DC | PRN

## 2021-05-21 MED ORDER — ALLOPURINOL 100 MG PO TABS
100.0000 mg | ORAL_TABLET | Freq: Every day | ORAL | Status: DC
  Administered 2021-05-21: 100 mg via ORAL
  Filled 2021-05-21: qty 1

## 2021-05-21 MED ORDER — SERTRALINE HCL 50 MG PO TABS
100.0000 mg | ORAL_TABLET | Freq: Every day | ORAL | Status: DC
  Administered 2021-05-21: 100 mg via ORAL
  Filled 2021-05-21: qty 2

## 2021-05-21 MED ORDER — ARIPIPRAZOLE 10 MG PO TABS
20.0000 mg | ORAL_TABLET | Freq: Every day | ORAL | Status: DC
  Administered 2021-05-21: 20 mg via ORAL
  Filled 2021-05-21: qty 2

## 2021-05-21 MED ORDER — PANTOPRAZOLE SODIUM 40 MG PO TBEC
40.0000 mg | DELAYED_RELEASE_TABLET | Freq: Every day | ORAL | Status: DC
  Administered 2021-05-21: 40 mg via ORAL
  Filled 2021-05-21: qty 1

## 2021-05-21 NOTE — Progress Notes (Signed)
Patient and care giver state understanding of discharge instructions

## 2021-05-21 NOTE — Discharge Summary (Signed)
Physician Discharge Summary  [name] [name] HTD:[id number] DOB: Jun 13, 1934 DOA: 05/19/2021  PCP: Glenda Chroman, MD  Admit date: 05/19/2021 Discharge date: 05/21/2021  Admitted From: Home Disposition:  Home  Recommendations for Outpatient Follow-up:  Follow up with PCP in 1-2 weeks Please obtain BMP/CBC in one week    Discharge Condition: Stable CODE STATUS:DNR Diet recommendation: Heart Healthy    Brief/Interim Summary: 87 year old male with a history of CKD stage IV, hypertension, GERD, diastolic CHF, depression presenting with hematuria and difficulty urinating.  The patient states that this occurred on the morning of 05/19/2021 after he passed a blood clot from his bladder.  He denies any fevers, chills, headache, chest pain, shortness of breath, nausea, vomiting, diarrhea, abdominal pain.  Notably, the patient was recently hospitalized from 05/12/2021 to 05/15/2021 at [facility]  He was discharged home with instructions to take furosemide 40 mg only as needed. He has been weighing himself daily, and states that he has gained 3 pounds from 164 to 167 pounds.  Therefore, he took 1 dose of furosemide 20 mg before he came to the emergency department on 05/19/2021.  Appetite has been good; however, the patient has been eating fast food on a daily basis for breakfast. In the emergency department, the patient had difficulty urinating.  Bladder scan revealed significant mount of urine.  Therefore, a Foley catheter was placed draining 400 cc.  The patient sodium was noted to be 124.  This was lower than usual.  As result, the patient was admitted for further evaluation and treatment.  The patient was otherwise afebrile hemodynamically stable with oxygen saturation 90-99% on room air.  BMP showed sodium 124, potassium 4.2, serum creatinine 3.33.  LFTs were unremarkable.  WBC 5.6, hemoglobin 8.7, platelets 157,000.  PCT was negative.  UA showed 21-50 WBC, 21-50 RBC.  CT abdomen pelvis without contrast  showed Bilateral hydronephrosis and hydroureter. No obstructing stone or lesion identified. Bladder decompressed with a Foley catheter. Appearance is similar to prior study.  Discharge Diagnoses:  Acute on chronic hyponatremia -May be due to diuresis/furosemide -Patient was started on IV normal saline x 24 hr with improvement of Na -Urine osmolarity 161 -Serum osmolarity 286 -FeNa--3.60% -Results may be altered secondary to patient receiving furosemide prior to admission -Na 131 on day of d/c   UTI/hematuria/hydronephrosis -Follow-up culture = Group B streptococcus -hematuria completely resolved at time of d/c -Continue IV ceftriaxone>>d/c home with cefdinir x 4 more days -Maintained Foley catheter  36-48 hours -foley discontinued 05/21/21 and patient was able to urinate spontaneously -Patient had noted history of hydronephrosis seen on CT 05/19/2020 -case discussed with urology, Dr. Linward Headland to follow up in office -drop in Hgb = dilution/equilibration   CKD stage IV -Baseline creatinine 2.8-3.1 -Continue bicarbonate -serum creatinine 3.21 at time of d/c   Chronic diastolic CHF -He appears clinically euvolemic -Monitor closely while on IV fluids -Continue carvedilol -05/07/2021 echo EF 55-60%, mild elevated PASP, mild MR   Pulmonary opacity -likely atelectasis -PCT<0.10 -no fever or leukocytosis or dyspnea -98% on RA   Essential hypertension -Continue carvedilol   Hyperlipidemia -Continue statin   Depression/anxiety -Continue alprazolam, Abilify, Zoloft       Discharge Instructions   Allergies as of 05/21/2021       Reactions   Ivp Dye [iodinated Diagnostic Agents]    Due to partial renal failure   Nalbuphine Other (See Comments)   PATIENT STATES IT MADE HIM FEEL LIKE HE WAS GOING TO DIE. NO SPECIFICS   Codeine  Anxiety        Medication List     STOP taking these medications    acetic acid 2 % otic solution   diphenhydrAMINE 2 % cream Commonly  known as: BENADRYL   gabapentin 100 MG capsule Commonly known as: NEURONTIN   HYDROcodone-acetaminophen 5-325 MG tablet Commonly known as: Norco   sulfamethoxazole-trimethoprim 800-160 MG tablet Commonly known as: BACTRIM DS       TAKE these medications    acetaminophen 500 MG tablet Commonly known as: TYLENOL Take 1,000 mg by mouth every 8 (eight) hours as needed for mild pain or moderate pain. For headache pain   albuterol 108 (90 Base) MCG/ACT inhaler Commonly known as: VENTOLIN HFA Inhale into the lungs every 6 (six) hours as needed for wheezing or shortness of breath.   allopurinol 100 MG tablet Commonly known as: ZYLOPRIM Take 100 mg by mouth daily.   ALPRAZolam 0.5 MG tablet Commonly known as: XANAX Take 1 tablet (0.5 mg total) by mouth 2 (two) times daily as needed for anxiety.   amLODipine 10 MG tablet Commonly known as: NORVASC Take 10 mg by mouth daily.   ARIPiprazole 20 MG tablet Commonly known as: ABILIFY Take 1 tablet (20 mg total) by mouth daily.   carvedilol 6.25 MG tablet Commonly known as: COREG Take 6.25 mg by mouth 2 (two) times daily.   cefdinir 300 MG capsule Commonly known as: OMNICEF Take 1 capsule (300 mg total) by mouth daily. Start taking on: May 22, 2021   epoetin alfa 3000 UNIT/ML injection Commonly known as: EPOGEN Inject 3,000 Units into the skin every 14 (fourteen) days.   fish oil-omega-3 fatty acids 1000 MG capsule Take 1 g by mouth daily.   furosemide 40 MG tablet Commonly known as: LASIX Take 40 mg by mouth daily as needed.   lansoprazole 15 MG capsule Commonly known as: PREVACID Take 1 capsule (15 mg total) by mouth 2 (two) times daily before a meal.   multivitamin with minerals Tabs tablet Take 1 tablet by mouth daily.   ofloxacin 0.3 % OTIC solution Commonly known as: FLOXIN Place 5 drops into both ears daily.   Risa-Bid Probiotic Tabs Give 1 tablet by mouth twice a day from 09/14/2018-09/24/2018    rosuvastatin 10 MG tablet Commonly known as: CRESTOR Take 10 mg by mouth daily.   sertraline 100 MG tablet Commonly known as: ZOLOFT Take 1 tablet (100 mg total) by mouth daily.   sodium bicarbonate 650 MG tablet Take 1 tablet (650 mg total) by mouth 3 (three) times daily.   traMADol 50 MG tablet Commonly known as: Ultram Take 1 tablet (50 mg total) by mouth every 6 (six) hours as needed.        Follow-up Information     Schedule an appointment as soon as possible for a visit  with Glenda Chroman, MD.   Specialty: Internal Medicine Contact information: [place] [address] [phone]         Schedule an appointment as soon as possible for a visit  with [place].   Specialty: Urology Contact information: [place] [address] [phone]               Allergies  Allergen Reactions   Ivp Dye [Iodinated Diagnostic Agents]     Due to partial renal failure   Nalbuphine Other (See Comments)    PATIENT STATES IT MADE HIM FEEL LIKE HE WAS GOING  TO DIE. 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   Cancer ([place])   prostate ca 1999/removal/ rad tx  Carotid artery occlusion   Depression   DVT (deep venous thrombosis) (HCC)   Gout   History of kidney stones   Hyperlipidemia   Hypertension   Incontinence   Reflux   Renal disorder   Status post implantation of artificial  urinary sphincter   Wears dentures   Wears glasses  Past Surgical History: Past Surgical History: Procedure Laterality Date  AV FISTULA PLACEMENT Left 05/15/2020  Procedure: LEFT FOREARM  ARTERIOVENOUS (AV) GRAFT INSERTION;  Surgeon: Angelia Mould, MD;  Location: [facility];  Service: Vascular;  Laterality: Left;  CARDIAC CATHETERIZATION    2000-2001  cataracts    CORONARY STENT PLACEMENT    HERNIA REPAIR    MULTIPLE TOOTH EXTRACTIONS    NEPHROSTOMY    radical prostectomy    URINARY SPHINCTER IMPLANT   HPI: Mr Senne is a 87yo who was referred for an MBSS in order to objectively assess the swallowing function; Pt reports he gets 'strangled' about "once a week" on water. Pt with hx of reflux and wears dentures.  No data recorded Assessment / Plan / Recommendation CHL IP CLINICAL IMPRESSIONS 04/25/2021 Clinical Impression Pt's oropharyngeal swallowing was noted to be grossly [facility] today. No penetration or aspiration was visualized. Note premature spillage to the level of the pyriforms, however laryngeal vestibule closure was adequate and hyolaryngeal excursion was good. Consistent min residue noted in the valleculae and pyriforms with thin liquids that was cleared with a cued or reflexive repeat swallow. Note prominent CP. Pt reports his "choking" happens with thin liquids when drinking from a bottle. SLP provided recommendation for Pt to pour bottled water into a cup and consume with a straw or via cup -- Question if Pt tilts his head back and consumes multiple consecutive swallows with a bottle. Further recommend continue with regular diet and thin liquids; meds are ok whole with liquids. Recommend Pt produce an additional dry swallow after the initial swallow. There is no ST f/u needed at this time. Thank you, SLP Visit Diagnosis Dysphagia, unspecified (R13.10) Attention and concentration deficit following -- Frontal lobe and executive function deficit following -- Impact on safety and function Mild aspiration risk    CHL IP TREATMENT RECOMMENDATION 04/25/2021 Treatment Recommendations No treatment recommended at this time   No flowsheet data found. CHL IP DIET RECOMMENDATION 04/25/2021 SLP Diet Recommendations Regular solids;Thin liquid Liquid Administration via Cup;Straw Medication Administration Whole meds with liquid Compensations Multiple dry swallows after each bite/sip Postural Changes Seated upright at 90 degrees   CHL IP OTHER RECOMMENDATIONS 04/25/2021 Recommended Consults -- Oral Care Recommendations Oral care BID Other Recommendations --   CHL IP FOLLOW UP RECOMMENDATIONS 04/25/2021 Follow up Recommendations None   No flowsheet data found.     CHL IP ORAL PHASE 04/25/2021 Oral Phase WFL Oral - Pudding Teaspoon -- Oral - Pudding Cup -- Oral - Honey Teaspoon -- Oral - Honey  Cup -- Oral - Nectar Teaspoon -- Oral - Nectar Cup -- Oral - Nectar Straw -- Oral - Thin Teaspoon -- Oral - Thin Cup -- Oral - Thin Straw -- Oral - Puree -- Oral - Mech Soft -- Oral - Regular -- Oral - Multi-Consistency -- Oral - Pill -- Oral Phase - Comment --  CHL IP PHARYNGEAL PHASE 04/25/2021 Pharyngeal Phase WFL Pharyngeal- Pudding Teaspoon -- Pharyngeal -- Pharyngeal- Pudding Cup -- Pharyngeal -- Pharyngeal- Honey Teaspoon -- Pharyngeal -- Pharyngeal- Honey Cup -- Pharyngeal -- Pharyngeal- Nectar Teaspoon -- Pharyngeal -- Pharyngeal- Nectar Cup -- Pharyngeal -- Pharyngeal- Nectar Straw -- Pharyngeal -- Pharyngeal- Thin Teaspoon -- Pharyngeal -- Pharyngeal- Thin Cup -- Pharyngeal -- Pharyngeal- Thin Straw -- Pharyngeal -- Pharyngeal- Puree -- Pharyngeal -- Pharyngeal- Mechanical Soft -- Pharyngeal -- Pharyngeal- Regular -- Pharyngeal -- Pharyngeal- Multi-consistency -- Pharyngeal -- Pharyngeal- Pill -- Pharyngeal -- Pharyngeal Comment --  CHL IP CERVICAL ESOPHAGEAL PHASE 04/25/2021 Cervical Esophageal Phase WFL Pudding Teaspoon -- Pudding Cup -- Honey Teaspoon -- Honey Cup -- Nectar Teaspoon -- Nectar Cup -- Nectar Straw -- Thin Teaspoon -- Thin Cup  -- Thin Straw -- Puree -- Mechanical Soft -- Regular -- Multi-consistency -- Pill -- Cervical Esophageal Comment -- Amelia H. Roddie Mc, CCC-SLP Speech Language Pathologist Wende Bushy 04/25/2021, 5:27 PM            CLINICAL DATA:  Dysphagia aspiration, shortness of breath, get strangled on liquids EXAM: MODIFIED BARIUM SWALLOW TECHNIQUE: Different consistencies of barium were administered orally to the patient by the Speech Pathologist. Imaging of the pharynx was performed in the lateral projection. The radiologist was present in the fluoroscopy room for this study, providing personal supervision. FLUOROSCOPY TIME:  Fluoroscopy Time:  2 minutes 54 seconds Radiation Exposure Index (if provided by the fluoroscopic device): 25.3 mGy Number of Acquired Spot Images: multiple fluoroscopic screen captures COMPARISON:  None FINDINGS: With thin barium by teaspoon, cup, and straw, no laryngeal penetration or aspiration were seen. Intermittent premature spillover of contrast to the vallecula and piriform sinuses. Mild vallecular and piriform sinus residuals were intermittently visualized, cleared by a requested second swallow. Applesauce and cracker consistency were swallowed without abnormality. Patient swallowed a 12.5 mm diameter barium tablet with thin barium. Tablet passed to the stomach without esophageal obstruction. Mild vallecular piriform sinus residuals of the thin barium were noted. IMPRESSION: Mild swallowing dysfunction as above. Please refer to the Speech Pathologists report for complete details and recommendations. Electronically Signed   By: Lavonia Dana M.D.   On: 04/25/2021 14:38   DG Chest Portable 1 View  Result Date: 05/20/2021 CLINICAL DATA:  Shortness of breath starting yesterday. EXAM: PORTABLE CHEST 1 VIEW COMPARISON:  05/12/2021 FINDINGS: Heart size and pulmonary vascularity are normal for technique. Coarse infiltrates throughout both lungs are unchanged since prior study, likely  representing chronic lung disease. No pleural effusions. No pneumothorax. No focal consolidation. Pleural calcifications in the apices. Degenerative changes in the spine and shoulders. Calcification of the aorta. 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 No evidence of active pulmonary disease. Electronically Signed   By: Lucienne Capers M.D.   On: 05/20/2021 01:59   ECHOCARDIOGRAM COMPLETE  Result Date: 05/07/2021    ECHOCARDIOGRAM REPORT   Patient Name:   NAKAI POLLIO Date of Exam: 05/07/2021 Medical Rec #:  [id number]       Height:       68.0 in Accession #:    [id number]      Weight:  177.0 lb Date of Birth:  [birth year]      BSA:          1.940 m Patient Age:    54 years        BP:           157/70 mmHg Patient Gender: M               HR:           67 bpm. Exam Location:  [facility] Procedure: 2D Echo, Cardiac Doppler and Color Doppler Indications:    R06.02 (ICD-10-CM) - SOB (shortness of breath)  History:        Patient has prior history of Echocardiogram examinations, most                 recent 09/08/2018. Risk Factors:Dyslipidemia and Hypertension.                 CAROTID ARTERY DISEASE. Paroxysmal A-fib.  Sonographer:    Alvino Chapel RCS Referring Phys: [id number] [place]  1. Left ventricular ejection fraction, by estimation, is 55 to 60%. The left ventricle has normal function. The left ventricular internal cavity size was mildly dilated. Left ventricular diastolic parameters were normal.  2. Right ventricular systolic function is normal. The right ventricular size is normal. There is moderately elevated pulmonary artery systolic pressure.  3. Left atrial size was moderately dilated.  4. Right atrial size was mildly dilated.  5. Mild mitral valve regurgitation.  6. AV is thickened, calcified with very mildly restricted motion. Peak and mean gradients through the valve are 21 and 11 mm Hg respectively. Dimensionless index is 0.48 Mild AS.Mar[place] Kitchen Aortic valve regurgitation is not  visualized.  7. The inferior vena cava is dilated in size with >50% respiratory variability, suggesting right atrial pressure of 8 mmHg. FINDINGS  Left Ventricle: Left ventricular ejection fraction, by estimation, is 55 to 60%. The left ventricle has normal function. The left ventricular internal cavity size was mildly dilated. There is no left ventricular hypertrophy. Left ventricular diastolic parameters were normal. Right Ventricle: The right ventricular size is normal. Right vetricular wall thickness was not assessed. Right ventricular systolic function is normal. There is moderately elevated pulmonary artery systolic pressure. The tricuspid regurgitant velocity is  3.47 m/s, and with an assumed right atrial pressure of 8 mmHg, the estimated right ventricular systolic pressure is 07.3 mmHg. Left Atrium: Left atrial size was moderately dilated. Right Atrium: Right atrial size was mildly dilated. Pericardium: Trivial pericardial effusion is present. Mitral Valve: There is mild thickening of the mitral valve leaflet(s). Mild mitral valve regurgitation. Tricuspid Valve: The tricuspid valve is normal in structure. Tricuspid valve regurgitation is mild. Aortic Valve: AV is thickened, calcified with very mildly restricted motion. Peak and mean gradients through the valve are 21 and 11 mm Hg respectively. Dimensionless index is 0.48 Mild AS. Aortic valve regurgitation is not visualized. Aortic valve mean gradient measures 11.0 mmHg. Aortic valve peak gradient measures 20.6 mmHg. Aortic valve area, by VTI measures 1.52 cm. Pulmonic Valve: The pulmonic valve was normal in structure. Pulmonic valve regurgitation is not visualized. Aorta: The aortic root is normal in size and structure. Venous: The inferior vena cava is dilated in size with greater than 50% respiratory variability, suggesting right atrial pressure of 8 mmHg. IAS/Shunts: No atrial level shunt detected by color flow Doppler.  LEFT VENTRICLE PLAX 2D LVIDd:          5.50 cm  Diastology LVIDs:  3.90 cm  LV e' medial:    8.49 cm/s LV PW:         1.10 cm  LV E/e' medial:  10.1 LV IVS:        1.00 cm  LV e' lateral:   11.10 cm/s LVOT diam:     2.00 cm  LV E/e' lateral: 7.7 LV SV:         85 LV SV Index:   44 LVOT Area:     3.14 cm  RIGHT VENTRICLE RV S prime:     14.30 cm/s TAPSE (M-mode): 3.2 cm LEFT ATRIUM             Index       RIGHT ATRIUM           Index LA diam:        4.20 cm 2.16 cm/m  RA Area:     24.00 cm LA Vol (A2C):   74.9 ml 38.60 ml/m RA Volume:   79.30 ml  40.87 ml/m LA Vol (A4C):   77.4 ml 39.89 ml/m LA Biplane Vol: 81.3 ml 41.90 ml/m  AORTIC VALVE AV Area (Vmax):    1.48 cm AV Area (Vmean):   1.44 cm AV Area (VTI):     1.52 cm AV Vmax:           227.00 cm/s AV Vmean:          160.000 cm/s AV VTI:            0.557 m AV Peak Grad:      20.6 mmHg AV Mean Grad:      11.0 mmHg LVOT Vmax:         107.00 cm/s LVOT Vmean:        73.200 cm/s LVOT VTI:          0.269 m LVOT/AV VTI ratio: 0.48  AORTA Ao Root diam: 3.40 cm MITRAL VALVE               TRICUSPID VALVE MV Area (PHT): 4.06 cm    TR Peak grad:   48.2 mmHg MV Decel Time: 187 msec    TR Vmax:        347.00 cm/s MV E velocity: 85.90 cm/s MV A velocity: 81.00 cm/s  SHUNTS MV E/A ratio:  1.06        Systemic VTI:  0.27 m                            Systemic Diam: 2.00 cm Dorris Carnes MD Electronically signed by Dorris Carnes MD Signature Date/Time: 05/07/2021/5:43:00 PM    Final    CT RENAL STONE STUDY  Result Date: 05/20/2021 CLINICAL DATA:  Difficulty urinating.  Hematuria. EXAM: CT ABDOMEN AND PELVIS WITHOUT CONTRAST TECHNIQUE: Multidetector CT imaging of the abdomen and pelvis was performed following the standard protocol without IV contrast. COMPARISON:  05/29/2020 FINDINGS: Lower chest: Motion artifact limits evaluation of lung bases but there appears to be patchy airspace infiltration in the right lung, possibly pneumonia. Peripheral interstitial process, likely fibrosis or edema, in the lung  bases. Minimal pleural effusions. Cardiac enlargement. Hepatobiliary: No focal liver abnormality is seen. No gallstones, gallbladder wall thickening, or biliary dilatation. Pancreas: Unremarkable. No pancreatic ductal dilatation or surrounding inflammatory changes. Spleen: Normal in size without focal abnormality. Adrenals/Urinary Tract: No adrenal gland nodules. Punctate stone in the upper pole of the right kidney. Mild bilateral hydronephrosis and hydroureter. No ureteral stones.  Bladder is decompressed with a Foley catheter. Appearance is unchanged since prior study. Stomach/Bowel: The stomach, small bowel, and colon are not abnormally distended. No wall thickening or inflammatory changes are appreciated. Diverticula in the sigmoid colon without evidence of diverticulitis. Appendix is normal. Vascular/Lymphatic: Aortic atherosclerosis. No enlarged abdominal or pelvic lymph nodes. 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 Surgical clips in the pelvis. No significant pelvic lymphadenopathy. Other: No free air or free fluid in the abdomen. Small left inguinal hernia containing fat. Musculoskeletal: Compression of L3, unchanged. No destructive bone lesions. IMPRESSION: 1. Bilateral hydronephrosis and hydroureter. No obstructing stone or lesion identified. Bladder decompressed with a Foley catheter. Appearance is similar to prior study. 2. Mild patchy airspace disease in the right lung, possibly pneumonia. Peripheral interstitial process in the lung bases may represent chronic fibrosis or edema. 3. Extensive abdominal aortic atherosclerosis. 4. Surgical absence of the prostate gland. No metastatic disease is appreciated. Electronically Signed   By: Lucienne Capers M.D.   On: 05/20/2021 00:50        Discharge Exam: Vitals:   05/21/21 0517 05/21/21 0830  BP: (!) 149/46 (!) 177/59  Pulse: 63 71  Resp: 18 16  Temp: 99 F (37.2 C) 98.4 F (36.9 C)  SpO2: 98% 97%   Vitals:   05/20/21 1642  05/20/21 2110 05/21/21 0517 05/21/21 0830  BP: (!) 175/70 (!) 122/39 (!) 149/46 (!) 177/59  Pulse: 75 64 63 71  Resp: 17 18 18 16   Temp: 98.6 F (37 C) 98.1 F (36.7 C) 99 F (37.2 C) 98.4 F (36.9 C)  TempSrc: Oral   Oral  SpO2: 98% 96% 98% 97%  Weight:      Height:        General: Pt is alert, awake, not in acute distress Cardiovascular: RRR, S1/S2 +, no rubs, no gallops Respiratory: CTA bilaterally, no wheezing, no rhonchi Abdominal: Soft, NT, ND, bowel sounds + Extremities: trace LE edema, no cyanosis   The results of significant diagnostics from this hospitalization (including imaging, microbiology, ancillary and laboratory) are listed below for reference.    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   Cancer ([place])   prostate ca 1999/removal/ rad tx  Carotid artery occlusion   Depression   DVT (deep venous thrombosis) (HCC)   Gout   History of kidney stones   Hyperlipidemia   Hypertension   Incontinence   Reflux   Renal disorder   Status post implantation of artificial urinary sphincter   Wears dentures   Wears glasses  Past Surgical History: Past Surgical History: Procedure Laterality Date  AV FISTULA PLACEMENT Left 05/15/2020  Procedure: LEFT FOREARM  ARTERIOVENOUS (AV) GRAFT INSERTION;  Surgeon: Angelia Mould, MD;  Location: [facility];  Service: Vascular;  Laterality: Left;  CARDIAC CATHETERIZATION    2000-2001  cataracts    CORONARY STENT PLACEMENT    HERNIA REPAIR    MULTIPLE TOOTH EXTRACTIONS     NEPHROSTOMY    radical prostectomy    URINARY SPHINCTER IMPLANT   HPI: Mr Manganaro is a 87yo who was  referred for an MBSS in order to objectively assess the swallowing function; Pt reports he gets 'strangled' about "once a week" on water. Pt with hx of reflux and wears dentures.  No data recorded Assessment / Plan / Recommendation CHL IP CLINICAL IMPRESSIONS 04/25/2021 Clinical Impression Pt's oropharyngeal swallowing was noted to be grossly [facility] today. No penetration or aspiration was visualized. Note premature spillage to the level of the pyriforms, however laryngeal vestibule closure was adequate and hyolaryngeal excursion was good. Consistent min residue noted in the valleculae and pyriforms with thin liquids that was cleared with a cued or reflexive repeat swallow. Note prominent CP. Pt reports his "choking" happens with thin liquids when drinking from a bottle. SLP provided recommendation for Pt to pour bottled water into a cup and consume with a straw or via cup -- Question if Pt tilts his head back and consumes multiple consecutive swallows with a bottle. Further recommend continue with regular diet and thin liquids; meds are ok whole with liquids. Recommend Pt produce an additional dry swallow after the initial swallow. There is no ST f/u needed at this time. Thank you, SLP Visit Diagnosis Dysphagia, unspecified (R13.10) Attention and concentration deficit following -- Frontal lobe and executive function deficit following -- Impact on safety and function Mild aspiration risk   [facility] IP TREATMENT RECOMMENDATION 04/25/2021 Treatment Recommendations No treatment recommended at this time   No flowsheet data found. [facility] IP DIET RECOMMENDATION 04/25/2021 SLP Diet Recommendations Regular solids;Thin liquid Liquid Administration via Cup;Straw Medication Administration Whole meds with liquid Compensations Multiple dry swallows after each bite/sip Postural Changes Seated upright at 90 degrees   CH[facility] IP OTHER RECOMMENDATIONS  04/25/2021 Recommended Consults -- Oral Care Recommendations Oral care BID Other Recommendations --   [facility] IP FOLLOW UP RECOMMENDATIONS 04/25/2021 Follow up Recommendations None   No flowsheet data found.     [facility]L IP ORAL PHASE 04/25/2021 Oral Phase WFL Oral - Pudding Teaspoon -- Oral - Pudding Cup -- Oral - Honey Teaspoon -- Oral - Honey Cup -- Oral - Nectar Teaspoon -- Oral - Nectar Cup -- Oral - Nectar Straw -- Oral - Thin Teaspoon -- Oral - Thin Cup -- Oral - Thin Straw -- Oral - Puree -- Oral - Mech Soft -- Oral - Regular -- Oral - Multi-Consistency -- Oral - Pill -- Oral Phase - Comment --  [facility] IP PHARYNGEAL PHASE 04/25/2021 Pharyngeal Phase WFL Pharyngeal- Pudding Teaspoon -- Pharyngeal -- Pharyngeal- Pudding Cup -- Pharyngeal -- Pharyngeal- Honey Teaspoon -- Pharyngeal -- Pharyngeal- Honey Cup -- Pharyngeal -- Pharyngeal- Nectar Teaspoon -- Pharyngeal -- Pharyngeal- Nectar Cup -- Pharyngeal -- Pharyngeal- Nectar Straw -- Pharyngeal -- Pharyngeal- Thin Teaspoon -- Pharyngeal -- Pharyngeal- Thin Cup -- Pharyngeal -- Pharyngeal- Thin Straw -- Pharyngeal -- Pharyngeal- Puree -- Pharyngeal -- Pharyngeal- Mechanical Soft -- Pharyngeal -- Pharyngeal- Regular -- Pharyngeal -- Pharyngeal- Multi-consistency -- Pharyngeal -- Pharyngeal- Pill -- Pharyngeal -- Pharyngeal Comment --  CHL IP CERVICAL ESOPHAGEAL PHASE 04/25/2021 Cervical Esophageal Phase WFL Pudding Teaspoon -- Pudding Cup -- Honey Teaspoon -- Honey Cup -- Nectar Teaspoon -- Nectar Cup -- Nectar Straw -- Thin Teaspoon -- Thin Cup -- Thin Straw -- Puree -- Mechanical Soft -- Regular -- Multi-consistency -- Pill -- Cervical Esophageal Comment -- Amelia H. Roddie Mc, CCC-SLP Speech Language Pathologist Wende Bushy 04/25/2021, 5:27 PM            CLINICAL DATA:  Dysphagia aspiration, shortness of breath, get strangled on liquids EXAM: MODIFIED BARIUM SWALLOW TECHNIQUE:  Different consistencies of barium were administered orally to the patient by the Speech  Pathologist. Imaging of the pharynx was performed in the lateral projection. The radiologist was present in the fluoroscopy room for this study, providing personal supervision. FLUOROSCOPY TIME:  Fluoroscopy Time:  2 minutes 54 seconds Radiation Exposure Index (if provided by the fluoroscopic device): 25.3 mGy Number of Acquired Spot Images: multiple fluoroscopic screen captures COMPARISON:  None FINDINGS: With thin barium by teaspoon, cup, and straw, no laryngeal penetration or aspiration were seen. Intermittent premature spillover of contrast to the vallecula and piriform sinuses. Mild vallecular and piriform sinus residuals were intermittently visualized, cleared by a requested second swallow. Applesauce and cracker consistency were swallowed without abnormality. Patient swallowed a 12.5 mm diameter barium tablet with thin barium. Tablet passed to the stomach without esophageal obstruction. Mild vallecular piriform sinus residuals of the thin barium were noted. IMPRESSION: Mild swallowing dysfunction as above. Please refer to the Speech Pathologists report for complete details and recommendations. Electronically Signed   By: Lavonia Dana M.D.   On: 04/25/2021 14:38   DG Chest Portable 1 View  Result Date: 05/20/2021 CLINICAL DATA:  Shortness of breath starting yesterday. EXAM: PORTABLE CHEST 1 VIEW COMPARISON:  05/12/2021 FINDINGS: Heart size and pulmonary vascularity are normal for technique. Coarse infiltrates throughout both lungs are unchanged since prior study, likely representing chronic lung disease. No pleural effusions. No pneumothorax. No focal consolidation. Pleural calcifications in the apices. Degenerative changes in the spine and shoulders. Calcification of the aorta. 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 No evidence of active pulmonary disease. Electronically Signed   By: Lucienne Capers M.D.   On: 05/20/2021 01:59   ECHOCARDIOGRAM COMPLETE  Result Date: 05/07/2021    ECHOCARDIOGRAM  REPORT   Patient Name:   KYAIRE GRUENEWALD Date of Exam: 05/07/2021 Medical Rec #:  [id number]       Height:       68.0 in Accession #:    [id number]      Weight:       177.0 lb Date of Birth:  03-Feb-1934      BSA:          1.940 m Patient Age:    34 years        BP:           157/70 mmHg Patient Gender: M               HR:           67 bpm. Exam Location:  [facility] Procedure: 2D Echo, Cardiac Doppler and Color Doppler Indications:    R06.02 (ICD-10-CM) - SOB (shortness of breath)  History:        Patient has prior history of Echocardiogram examinations, most                 recent 09/08/2018. Risk Factors:Dyslipidemia and Hypertension.                 CAROTID ARTERY DISEASE. Paroxysmal A-fib.  Sonographer:    Alvino Chapel RCS Referring Phys: [phone] [place]  1. Left ventricular ejection fraction, by estimation, is 55 to 60%. The left ventricle has normal function. The left ventricular internal cavity size was mildly dilated. Left ventricular diastolic parameters were normal.  2. Right ventricular systolic function is normal. The right ventricular size is normal. There is moderately elevated pulmonary artery systolic pressure.  3. Left atrial size was moderately dilated.  4. Right atrial size was  mildly dilated.  5. Mild mitral valve regurgitation.  6. AV is thickened, calcified with very mildly restricted motion. Peak and mean gradients through the valve are 21 and 11 mm Hg respectively. Dimensionless index is 0.48 Mild AS.Marland Kitchen Aortic valve regurgitation is not visualized.  7. The inferior vena cava is dilated in size with >50% respiratory variability, suggesting right atrial pressure of 8 mmHg. FINDINGS  Left Ventricle: Left ventricular ejection fraction, by estimation, is 55 to 60%. The left ventricle has normal function. The left ventricular internal cavity size was mildly dilated. There is no left ventricular hypertrophy. Left ventricular diastolic parameters were normal. Right Ventricle: The right  ventricular size is normal. Right vetricular wall thickness was not assessed. Right ventricular systolic function is normal. There is moderately elevated pulmonary artery systolic pressure. The tricuspid regurgitant velocity is  3.47 m/s, and with an assumed right atrial pressure of 8 mmHg, the estimated right ventricular systolic pressure is 18.2 mmHg. Left Atrium: Left atrial size was moderately dilated. Right Atrium: Right atrial size was mildly dilated. Pericardium: Trivial pericardial effusion is present. Mitral Valve: There is mild thickening of the mitral valve leaflet(s). Mild mitral valve regurgitation. Tricuspid Valve: The tricuspid valve is normal in structure. Tricuspid valve regurgitation is mild. Aortic Valve: AV is thickened, calcified with very mildly restricted motion. Peak and mean gradients through the valve are 21 and 11 mm Hg respectively. Dimensionless index is 0.48 Mild AS. Aortic valve regurgitation is not visualized. Aortic valve mean gradient measures 11.0 mmHg. Aortic valve peak gradient measures 20.6 mmHg. Aortic valve area, by VTI measures 1.52 cm. Pulmonic Valve: The pulmonic valve was normal in structure. Pulmonic valve regurgitation is not visualized. Aorta: The aortic root is normal in size and structure. Venous: The inferior vena cava is dilated in size with greater than 50% respiratory variability, suggesting right atrial pressure of 8 mmHg. IAS/Shunts: No atrial level shunt detected by color flow Doppler.  LEFT VENTRICLE PLAX 2D LVIDd:         5.50 cm  Diastology LVIDs:         3.90 cm  LV e' medial:    8.49 cm/s LV PW:         1.10 cm  LV E/e' medial:  10.1 LV IVS:        1.00 cm  LV e' lateral:   11.10 cm/s LVOT diam:     2.00 cm  LV E/e' lateral: 7.7 LV SV:         85 LV SV Index:   44 LVOT Area:     3.14 cm  RIGHT VENTRICLE RV S prime:     14.30 cm/s TAPSE (M-mode): 3.2 cm LEFT ATRIUM             Index       RIGHT ATRIUM           Index LA diam:        4.20 cm 2.16 cm/m  RA  Area:     24.00 cm LA Vol (A2C):   74.9 ml 38.60 ml/m RA Volume:   79.30 ml  40.87 ml/m LA Vol (A4C):   77.4 ml 39.89 ml/m LA Biplane Vol: 81.3 ml 41.90 ml/m  AORTIC VALVE AV Area (Vmax):    1.48 cm AV Area (Vmean):   1.44 cm AV Area (VTI):     1.52 cm AV Vmax:           227.00 cm/s AV Vmean:  160.000 cm/s AV VTI:            0.557 m AV Peak Grad:      20.6 mmHg AV Mean Grad:      11.0 mmHg LVOT Vmax:         107.00 cm/s LVOT Vmean:        73.200 cm/s LVOT VTI:          0.269 m LVOT/AV VTI ratio: 0.48  AORTA Ao Root diam: 3.40 cm MITRAL VALVE               TRICUSPID VALVE MV Area (PHT): 4.06 cm    TR Peak grad:   48.2 mmHg MV Decel Time: 187 msec    TR Vmax:        347.00 cm/s MV E velocity: 85.90 cm/s MV A velocity: 81.00 cm/s  SHUNTS MV E/A ratio:  1.06        Systemic VTI:  0.27 m                            Systemic Diam: 2.00 cm Dorris Carnes MD Electronically signed by Dorris Carnes MD Signature Date/Time: 05/07/2021/5:43:00 PM    Final    CT RENAL STONE STUDY  Result Date: 05/20/2021 CLINICAL DATA:  Difficulty urinating.  Hematuria. EXAM: CT ABDOMEN AND PELVIS WITHOUT CONTRAST TECHNIQUE: Multidetector CT imaging of the abdomen and pelvis was performed following the standard protocol without IV contrast. COMPARISON:  05/29/2020 FINDINGS: Lower chest: Motion artifact limits evaluation of lung bases but there appears to be patchy airspace infiltration in the right lung, possibly pneumonia. Peripheral interstitial process, likely fibrosis or edema, in the lung bases. Minimal pleural effusions. Cardiac enlargement. Hepatobiliary: No focal liver abnormality is seen. No gallstones, gallbladder wall thickening, or biliary dilatation. Pancreas: Unremarkable. No pancreatic ductal dilatation or surrounding inflammatory changes. Spleen: Normal in size without focal abnormality. Adrenals/Urinary Tract: No adrenal gland nodules. Punctate stone in the upper pole of the right kidney. Mild bilateral hydronephrosis  and hydroureter. No ureteral stones. Bladder is decompressed with a Foley catheter. Appearance is unchanged since prior study. Stomach/Bowel: The stomach, small bowel, and colon are not abnormally distended. No wall thickening or inflammatory changes are appreciated. Diverticula in the sigmoid colon without evidence of diverticulitis. Appendix is normal. Vascular/Lymphatic: Aortic atherosclerosis. No enlarged abdominal or pelvic lymph nodes. 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 Surgical clips in the pelvis. No significant pelvic lymphadenopathy. Other: No free air or free fluid in the abdomen. Small left inguinal hernia containing fat. Musculoskeletal: Compression of L3, unchanged. No destructive bone lesions. IMPRESSION: 1. Bilateral hydronephrosis and hydroureter. No obstructing stone or lesion identified. Bladder decompressed with a Foley catheter. Appearance is similar to prior study. 2. Mild patchy airspace disease in the right lung, possibly pneumonia. Peripheral interstitial process in the lung bases may represent chronic fibrosis or edema. 3. Extensive abdominal aortic atherosclerosis. 4. Surgical absence of the prostate gland. No metastatic disease is appreciated. Electronically Signed   By: Lucienne Capers M.D.   On: 05/20/2021 00:50    Microbiology: Recent Results (from the past 240 hour(s))  Urine Culture     Status: Abnormal   Collection Time: 05/19/21 11:57 PM   Specimen: Urine, Clean Catch  Result Value Ref Range Status   Specimen Description   Final    URINE, CLEAN CATCH Performed at [facility], [address] [place], [place] [address]    Special Requests   Final  NONE Performed at [facility], [address], [place], [place] [address]    Culture (A)  Final    30,000 COLONIES/mL GROUP B STREP(S.AGALACTIAE)ISOLATED TESTING AGAINST S. AGALACTIAE NOT ROUTINELY PERFORMED DUE TO PREDICTABILITY OF AMP/PEN/VAN SUSCEPTIBILITY. Performed at [facility], [place] [address] [place], [place] [address]    Report Status 05/21/2021 FINAL  Final  Resp Panel by RT-PCR (Flu A&B, Covid) Nasopharyngeal Swab     Status: None   Collection Time: 05/20/21  3:20 AM   Specimen: Nasopharyngeal Swab; Nasopharyngeal(NP) swabs in vial transport medium  Result Value Ref Range Status   SARS Coronavirus 2 by RT PCR NEGATIVE NEGATIVE Final    Comment: (NOTE) SARS-CoV-2 target nucleic acids are NOT DETECTED.  The SARS-CoV-2 RNA is generally detectable in upper respiratory specimens during the acute phase of infection. The lowest concentration of SARS-CoV-2 viral copies this assay can detect is 138 copies/mL. A negative result does not preclude SARS-Cov-2 infection and should not be used as the sole basis for treatment or other patient management decisions. A negative result may occur with  improper specimen collection/handling, submission of specimen other than nasopharyngeal swab, presence of viral mutation(s) within the areas targeted by this assay, and inadequate number of viral copies(<138 copies/mL). A negative result must be combined with clinical observations, patient history, and epidemiological information. The expected result is Negative.  Fact Sheet for Patients:  EntrepreneurPulse.com.au  Fact Sheet for Healthcare Providers:  IncredibleEmployment.be  This test is no t yet approved or cleared by the [place] FDA and  has been authorized for detection and/or diagnosis of SARS-CoV-2 by FDA under an Emergency Use Authorization (EUA). This EUA will remain  in effect (meaning this test can be used) for the duration of the COVID-19 declaration under Section 564(b)(1) of the Act, 21 U.S.C.section 360bbb-3(b)(1), unless the authorization is terminated  or revoked sooner.       Influenza A by PCR NEGATIVE NEGATIVE Final   Influenza B by PCR NEGATIVE NEGATIVE Final    Comment: (NOTE) The Xpert Xpress SARS-CoV-2/FLU/RSV  plus assay is intended as an aid in the diagnosis of influenza from Nasopharyngeal swab specimens and should not be used as a sole basis for treatment. Nasal washings and aspirates are unacceptable for Xpert Xpress SARS-CoV-2/FLU/RSV testing.  Fact Sheet for Patients: EntrepreneurPulse.com.au  Fact Sheet for Healthcare Providers: IncredibleEmployment.be  This test is not yet approved or cleared by the [place] FDA and has been authorized for detection and/or diagnosis of SARS-CoV-2 by FDA under an Emergency Use Authorization (EUA). This EUA will remain in effect (meaning this test can be used) for the duration of the COVID-19 declaration under Section 564(b)(1) of the Act, 21 U.S.C. section 360bbb-3(b)(1), unless the authorization is terminated or revoked.  Performed at [facility], [address], [place], [place] [address]      Labs: Basic Metabolic Panel: Recent Labs  Lab 05/19/21 2339 05/20/21 1120 05/20/21 1722 05/21/21 0634  NA 124* 130* 130* 131*  K 4.2 3.8 4.0 4.0  CL 88* 96* 96* 98  CO2 26 25 25 25   GLUCOSE 106* 98 117* 91  BUN 43* 40* 39* 38*  CREATININE 3.33* 3.07* 3.04* 3.21*  CALCIUM 8.6* 8.6* 8.6* 8.5*  MG  --   --  1.8  --   PHOS  --   --  4.2  --    Liver Function Tests: Recent Labs  Lab 05/19/21 2339 05/20/21 1722  AST 25 20  ALT 16 15  ALKPHOS 68 58  BILITOT 0.3 0.2*  PROT 7.2 6.6  ALBUMIN 3.6 3.2*   No results for input(s): LIPASE, AMYLASE in the last 168 hours. No results for input(s): AMMONIA in the last 168 hours. CBC: Recent Labs  Lab 05/19/21 2339 05/20/21 1722 05/21/21 0634  WBC 5.6 5.1 6.2  NEUTROABS 3.8  --   --   HGB 8.7* 8.1* 7.5*  HCT 25.8* 24.1* 22.9*  MCV 92.8 93.8 95.0  PLT 157 161 146*   Cardiac Enzymes: No results for input(s): CKTOTAL, CKMB, CKMBINDEX, TROPONINI in the last 168 hours. BNP: Invalid input(s): POCBNP CBG: No results for input(s): GLUCAP in the last 168  hours.  Time coordinating discharge:  36 minutes  Signed:  Orson Eva, DO [facility] Pager: [phone] 05/21/2021, 12:07 PM

## 2021-05-23 ENCOUNTER — Encounter (HOSPITAL_COMMUNITY)
Admission: RE | Admit: 2021-05-23 | Discharge: 2021-05-23 | Disposition: A | Discharge status: Home / Self Care | Payer: MEDICARE | Source: Ambulatory Visit | Attending: Nephrology | Admitting: Nephrology

## 2021-05-23 ENCOUNTER — Encounter (HOSPITAL_COMMUNITY): Payer: Self-pay

## 2021-05-23 ENCOUNTER — Other Ambulatory Visit: Payer: Self-pay

## 2021-05-23 DIAGNOSIS — D631 Anemia in chronic kidney disease: Secondary | ICD-10-CM | POA: Diagnosis not present

## 2021-05-23 DIAGNOSIS — N184 Chronic kidney disease, stage 4 (severe): Secondary | ICD-10-CM | POA: Insufficient documentation

## 2021-05-23 LAB — PROTEIN / CREATININE RATIO, URINE
Creatinine, Urine: 51.87 mg/dL
Protein Creatinine Ratio: 5.8 mg/mg{Cre} — ABNORMAL HIGH (ref 0.00–0.15)
Total Protein, Urine: 301 mg/dL

## 2021-05-23 LAB — POCT HEMOGLOBIN-HEMACUE: Hemoglobin: 8.1 g/dL — ABNORMAL LOW (ref 13.0–17.0)

## 2021-05-23 MED ORDER — EPOETIN ALFA 3000 UNIT/ML IJ SOLN
3000.0000 [IU] | Freq: Once | INTRAMUSCULAR | Status: AC
  Administered 2021-05-23: 3000 [IU] via SUBCUTANEOUS
  Filled 2021-05-23: qty 1

## 2021-05-28 DIAGNOSIS — N184 Chronic kidney disease, stage 4 (severe): Secondary | ICD-10-CM | POA: Diagnosis not present

## 2021-05-28 DIAGNOSIS — I130 Hypertensive heart and chronic kidney disease with heart failure and stage 1 through stage 4 chronic kidney disease, or unspecified chronic kidney disease: Secondary | ICD-10-CM | POA: Diagnosis not present

## 2021-05-28 DIAGNOSIS — N185 Chronic kidney disease, stage 5: Secondary | ICD-10-CM | POA: Diagnosis not present

## 2021-05-28 DIAGNOSIS — E782 Mixed hyperlipidemia: Secondary | ICD-10-CM | POA: Diagnosis not present

## 2021-05-28 DIAGNOSIS — I10 Essential (primary) hypertension: Secondary | ICD-10-CM | POA: Diagnosis not present

## 2021-05-28 DIAGNOSIS — N189 Chronic kidney disease, unspecified: Secondary | ICD-10-CM | POA: Diagnosis not present

## 2021-05-28 DIAGNOSIS — I5031 Acute diastolic (congestive) heart failure: Secondary | ICD-10-CM | POA: Diagnosis not present

## 2021-05-28 DIAGNOSIS — I5032 Chronic diastolic (congestive) heart failure: Secondary | ICD-10-CM | POA: Diagnosis not present

## 2021-05-28 DIAGNOSIS — D631 Anemia in chronic kidney disease: Secondary | ICD-10-CM | POA: Diagnosis not present

## 2021-05-30 DIAGNOSIS — C61 Malignant neoplasm of prostate: Secondary | ICD-10-CM | POA: Diagnosis not present

## 2021-05-30 DIAGNOSIS — I700 Atherosclerosis of aorta: Secondary | ICD-10-CM | POA: Diagnosis not present

## 2021-05-30 DIAGNOSIS — I10 Essential (primary) hypertension: Secondary | ICD-10-CM | POA: Diagnosis not present

## 2021-05-30 DIAGNOSIS — Z09 Encounter for follow-up examination after completed treatment for conditions other than malignant neoplasm: Secondary | ICD-10-CM | POA: Diagnosis not present

## 2021-05-30 DIAGNOSIS — I270 Primary pulmonary hypertension: Secondary | ICD-10-CM | POA: Diagnosis not present

## 2021-05-30 DIAGNOSIS — I509 Heart failure, unspecified: Secondary | ICD-10-CM | POA: Diagnosis not present

## 2021-05-31 DIAGNOSIS — N170 Acute kidney failure with tubular necrosis: Secondary | ICD-10-CM | POA: Diagnosis not present

## 2021-05-31 DIAGNOSIS — E871 Hypo-osmolality and hyponatremia: Secondary | ICD-10-CM | POA: Diagnosis not present

## 2021-05-31 DIAGNOSIS — D631 Anemia in chronic kidney disease: Secondary | ICD-10-CM | POA: Diagnosis not present

## 2021-05-31 DIAGNOSIS — N184 Chronic kidney disease, stage 4 (severe): Secondary | ICD-10-CM | POA: Diagnosis not present

## 2021-05-31 DIAGNOSIS — N189 Chronic kidney disease, unspecified: Secondary | ICD-10-CM | POA: Diagnosis not present

## 2021-06-01 ENCOUNTER — Encounter: Payer: Self-pay | Admitting: Urology

## 2021-06-01 ENCOUNTER — Ambulatory Visit (INDEPENDENT_AMBULATORY_CARE_PROVIDER_SITE_OTHER): Payer: MEDICARE | Admitting: Urology

## 2021-06-01 ENCOUNTER — Other Ambulatory Visit: Payer: Self-pay

## 2021-06-01 VITALS — BP 139/55 | HR 83

## 2021-06-01 DIAGNOSIS — R310 Gross hematuria: Secondary | ICD-10-CM | POA: Diagnosis not present

## 2021-06-01 DIAGNOSIS — N401 Enlarged prostate with lower urinary tract symptoms: Secondary | ICD-10-CM

## 2021-06-01 DIAGNOSIS — N138 Other obstructive and reflux uropathy: Secondary | ICD-10-CM

## 2021-06-01 DIAGNOSIS — R339 Retention of urine, unspecified: Secondary | ICD-10-CM

## 2021-06-01 DIAGNOSIS — N3281 Overactive bladder: Secondary | ICD-10-CM

## 2021-06-01 LAB — BLADDER SCAN AMB NON-IMAGING: Scan Result: 0

## 2021-06-01 MED ORDER — FINASTERIDE 5 MG PO TABS: 5.0000 mg | ORAL_TABLET | Freq: Every day | ORAL | 3 refills | Status: DC

## 2021-06-01 MED ORDER — TAMSULOSIN HCL 0.4 MG PO CAPS: 0.4000 mg | ORAL_CAPSULE | Freq: Every day | ORAL | 11 refills | Status: DC

## 2021-06-01 NOTE — Progress Notes (Signed)
post void residual=0 Urological Symptom Review  Patient is experiencing the following symptoms: none   Review of Systems  Gastrointestinal (upper)  : Negative for upper GI symptoms  Gastrointestinal (lower) : Negative for lower GI symptoms  Constitutional : Negative for symptoms  Skin: Negative for skin symptoms  Eyes: Negative for eye symptoms  Ear/Nose/Throat : Negative for Ear/Nose/Throat symptoms  Hematologic/Lymphatic: Negative for Hematologic/Lymphatic symptoms  Cardiovascular : Leg swelling sometimes  Respiratory : Negative for respiratory symptoms  Endocrine: Negative for endocrine symptoms  Musculoskeletal: Negative for musculoskeletal symptoms  Neurological: Dizziness sometimes  Psychologic: Depression Anxiety sometimes

## 2021-06-01 NOTE — Patient Instructions (Signed)
Benign Prostatic Hyperplasia  Benign prostatic hyperplasia (BPH) is an enlarged prostate gland that is caused by the normal aging process and not by cancer. The prostate is a walnut-sized gland that is involved in the production of semen. It is located in front of the rectum and below the bladder. The bladder stores urine and the urethra is the tube that carries the urine out of the body. The prostate may get bigger asa man gets older. An enlarged prostate can press on the urethra. This can make it harder to pass urine. The build-up of urine in the bladder can cause infection. Back pressure and infection may progress to bladder damage and kidney (renal) failure. What are the causes? This condition is part of a normal aging process. However, not all men develop problems from this condition. If the prostate enlarges away from the urethra, urine flow will not be blocked. If it enlarges toward the urethra andcompresses it, there will be problems passing urine. What increases the risk? This condition is more likely to develop in men over the age of 50 years. What are the signs or symptoms? Symptoms of this condition include: Getting up often during the night to urinate. Needing to urinate frequently during the day. Difficulty starting urine flow. Decrease in size and strength of your urine stream. Leaking (dribbling) after urinating. Inability to pass urine. This needs immediate treatment. Inability to completely empty your bladder. Pain when you pass urine. This is more common if there is also an infection. Urinary tract infection (UTI). How is this diagnosed? This condition is diagnosed based on your medical history, a physical exam, and your symptoms. Tests will also be done, such as: A post-void bladder scan. This measures any amount of urine that may remain in your bladder after you finish urinating. A digital rectal exam. In a rectal exam, your health care provider checks your prostate by  putting a lubricated, gloved finger into your rectum to feel the back of your prostate gland. This exam detects the size of your gland and any abnormal lumps or growths. An exam of your urine (urinalysis). A prostate specific antigen (PSA) screening. This is a blood test used to screen for prostate cancer. An ultrasound. This test uses sound waves to electronically produce a picture of your prostate gland. Your health care provider may refer you to a specialist in kidney and prostate diseases (urologist). How is this treated? Once symptoms begin, your health care provider will monitor your condition (active surveillance or watchful waiting). Treatment for this condition will depend on the severity of your condition. Treatment may include: Observation and yearly exams. This may be the only treatment needed if your condition and symptoms are mild. Medicines to relieve your symptoms, including: Medicines to shrink the prostate. Medicines to relax the muscle of the prostate. Surgery in severe cases. Surgery may include: Prostatectomy. In this procedure, the prostate tissue is removed completely through an open incision or with a laparoscope or robotics. Transurethral resection of the prostate (TURP). In this procedure, a tool is inserted through the opening at the tip of the penis (urethra). It is used to cut away tissue of the inner core of the prostate. The pieces are removed through the same opening of the penis. This removes the blockage. Transurethral incision (TUIP). In this procedure, small cuts are made in the prostate. This lessens the prostate's pressure on the urethra. Transurethral microwave thermotherapy (TUMT). This procedure uses microwaves to create heat. The heat destroys and removes a small   amount of prostate tissue. Transurethral needle ablation (TUNA). This procedure uses radio frequencies to destroy and remove a small amount of prostate tissue. Interstitial laser coagulation (ILC).  This procedure uses a laser to destroy and remove a small amount of prostate tissue. Transurethral electrovaporization (TUVP). This procedure uses electrodes to destroy and remove a small amount of prostate tissue. Prostatic urethral lift. This procedure inserts an implant to push the lobes of the prostate away from the urethra. Follow these instructions at home: Take over-the-counter and prescription medicines only as told by your health care provider. Monitor your symptoms for any changes. Contact your health care provider with any changes. Avoid drinking large amounts of liquid before going to bed or out in public. Avoid or reduce how much caffeine or alcohol you drink. Give yourself time when you urinate. Keep all follow-up visits as told by your health care provider. This is important. Contact a health care provider if: You have unexplained back pain. Your symptoms do not get better with treatment. You develop side effects from the medicine you are taking. Your urine becomes very dark or has a bad smell. Your lower abdomen becomes distended and you have trouble passing your urine. Get help right away if: You have a fever or chills. You suddenly cannot urinate. You feel lightheaded, or very dizzy, or you faint. There are large amounts of blood or clots in the urine. Your urinary problems become hard to manage. You develop moderate to severe low back or flank pain. The flank is the side of your body between the ribs and the hip. These symptoms may represent a serious problem that is an emergency. Do not wait to see if the symptoms will go away. Get medical help right away. Call your local emergency services (911 in the [place] Do not drive yourself to the hospital. Summary Benign prostatic hyperplasia (BPH) is an enlarged prostate that is caused by the normal aging process and not by cancer. An enlarged prostate can press on the urethra. This can make it hard to pass urine. This  condition is part of a normal aging process and is more likely to develop in men over the age of 50 years. Get help right away if you suddenly cannot urinate. This information is not intended to replace advice given to you by your health care provider. Make sure you discuss any questions you have with your healthcare provider. Document Revised: 07/27/2020 Document Reviewed: 07/27/2020 Elsevier Patient Education  2022 [employer]  

## 2021-06-01 NOTE — Progress Notes (Signed)
06/01/2021 11:30 AM   [name] [name] 1934/07/28 [id number]  Referring provider: Glenda Chroman, MD [place],  [place] [id number]  Followup BPh with urinary retention   HPI: [name] [name] is a 87yo here for followup for BPH, OAB, and urinary retention. No gross hematuria episodes since last visit. He remains on flomax 0.4mg  daily and finasteride. IPSS 17 QOL 2. Urine stream fair. No urinary hesitancy, no straining to urinate. He continue to have urinary frequency every 1 hour, urinary urgency and occasional urge incontinence. No other complaints today.    PMH: Past Medical History:  Diagnosis Date   Anemia    Anxiety    Artificial opening care, other    pt states that he has artifical sphincter, unable to have foley cath placed.    Cancer ([place])    prostate ca 1999/removal/ rad tx   Carotid artery occlusion    Depression    DVT (deep venous thrombosis) (HCC)    Gout    History of kidney stones    Hyperlipidemia    Hypertension    Incontinence    Reflux    Renal disorder    Status post implantation of artificial urinary sphincter    Wears dentures    Wears glasses     Surgical History: Past Surgical History:  Procedure Laterality Date   AV FISTULA PLACEMENT Left 05/15/2020   Procedure: LEFT FOREARM  ARTERIOVENOUS (AV) GRAFT INSERTION;  Surgeon: Angelia Mould, MD;  Location: [facility];  Service: Vascular;  Laterality: Left;   CARDIAC CATHETERIZATION     2000-2001   cataracts     CORONARY STENT PLACEMENT     HERNIA REPAIR     MULTIPLE TOOTH EXTRACTIONS     NEPHROSTOMY     radical prostectomy     URINARY SPHINCTER IMPLANT      Home Medications:  Allergies as of 06/01/2021       Reactions   Ivp Dye [iodinated Diagnostic Agents]    Due to partial renal failure   Nalbuphine Other (See Comments)   PATIENT STATES IT MADE HIM FEEL LIKE HE WAS GOING TO DIE. NO SPECIFICS   Codeine Anxiety        Medication List        Accurate as of June 01, 2021 11:30  AM. If you have any questions, ask your nurse or doctor.          acetaminophen 500 MG tablet Commonly known as: TYLENOL Take 1,000 mg by mouth every 8 (eight) hours as needed for mild pain or moderate pain. For headache pain   albuterol 108 (90 Base) MCG/ACT inhaler Commonly known as: VENTOLIN HFA Inhale into the lungs every 6 (six) hours as needed for wheezing or shortness of breath.   allopurinol 100 MG tablet Commonly known as: ZYLOPRIM Take 100 mg by mouth daily.   ALPRAZolam 0.5 MG tablet Commonly known as: XANAX Take 1 tablet (0.5 mg total) by mouth 2 (two) times daily as needed for anxiety.   amLODipine 10 MG tablet Commonly known as: NORVASC Take 10 mg by mouth daily.   ARIPiprazole 20 MG tablet Commonly known as: ABILIFY Take 1 tablet (20 mg total) by mouth daily.   carvedilol 6.25 MG tablet Commonly known as: COREG Take 6.25 mg by mouth 2 (two) times daily.   cefdinir 300 MG capsule Commonly known as: OMNICEF Take 1 capsule (300 mg total) by mouth daily.   epoetin alfa 3000 UNIT/ML injection Commonly known as:  EPOGEN Inject 3,000 Units into the skin every 14 (fourteen) days.   finasteride 5 MG tablet Commonly known as: PROSCAR Take 1 tablet (5 mg total) by mouth daily. Started by: Nicolette Bang, MD   fish oil-omega-3 fatty acids 1000 MG capsule Take 1 g by mouth daily.   furosemide 40 MG tablet Commonly known as: LASIX Take 40 mg by mouth daily as needed.   lansoprazole 15 MG capsule Commonly known as: PREVACID Take 1 capsule (15 mg total) by mouth 2 (two) times daily before a meal.   multivitamin with minerals Tabs tablet Take 1 tablet by mouth daily.   ofloxacin 0.3 % OTIC solution Commonly known as: FLOXIN Place 5 drops into both ears daily.   Risa-Bid Probiotic Tabs Give 1 tablet by mouth twice a day from 09/14/2018-09/24/2018   rosuvastatin 10 MG tablet Commonly known as: CRESTOR Take 10 mg by mouth daily.   sertraline 100 MG  tablet Commonly known as: ZOLOFT Take 1 tablet (100 mg total) by mouth daily.   sodium bicarbonate 650 MG tablet Take 1 tablet (650 mg total) by mouth 3 (three) times daily.   tamsulosin 0.4 MG Caps capsule Commonly known as: FLOMAX Take 1 capsule (0.4 mg total) by mouth daily after supper. Started by: Nicolette Bang, MD   traMADol 50 MG tablet Commonly known as: Ultram Take 1 tablet (50 mg total) by mouth every 6 (six) hours as needed.        Allergies:  Allergies  Allergen Reactions   Ivp Dye [Iodinated Diagnostic Agents]     Due to partial renal failure   Nalbuphine Other (See Comments)    PATIENT STATES IT MADE HIM FEEL LIKE HE WAS GOING TO DIE. NO SPECIFICS   Codeine Anxiety    Family History: Family History  Problem Relation Age of Onset   Coronary artery disease Other        family history, male < 39   Arthritis Other        family history   Cancer Other    Kidney disease Other     Social History:  reports that he quit smoking about 48 years ago. His smoking use included cigarettes. He started smoking about 58 years ago. He smoked an average of 2.00 packs per day. He has never used smokeless tobacco. He reports that he does not drink alcohol and does not use drugs.  ROS: All other review of systems were reviewed and are negative except what is noted above in HPI  Physical Exam: BP (!) 139/55   Pulse 83   Constitutional:  Alert and oriented, No acute distress. HEENT: [place] AT, moist mucus membranes.  Trachea midline, no masses. Cardiovascular: No clubbing, cyanosis, or edema. Respiratory: Normal respiratory effort, no increased work of breathing. GI: Abdomen is soft, nontender, nondistended, no abdominal masses GU: No CVA tenderness.  Lymph: No cervical or inguinal lymphadenopathy. Skin: No rashes, bruises or suspicious lesions. Neurologic: Grossly intact, no focal deficits, moving all 4 extremities. Psychiatric: Normal mood and affect.  Laboratory  Data: Lab Results  Component Value Date   WBC 6.2 05/21/2021   HGB 8.1 (L) 05/23/2021   HCT 22.9 (L) 05/21/2021   MCV 95.0 05/21/2021   PLT 146 (L) 05/21/2021    Lab Results  Component Value Date   CREATININE 3.21 (H) 05/21/2021    No results found for: PSA  No results found for: TESTOSTERONE  Lab Results  Component Value Date   HGBA1C 5.5 05/29/2020  Urinalysis    Component Value Date/Time   COLORURINE YELLOW 05/19/2021 2357   APPEARANCEUR CLEAR 05/19/2021 2357   LABSPEC 1.004 (L) 05/19/2021 2357   PHURINE 8.0 05/19/2021 2357   GLUCOSEU NEGATIVE 05/19/2021 2357   HGBUR LARGE (A) 05/19/2021 2357   BILIRUBINUR NEGATIVE 05/19/2021 2357   KETONESUR NEGATIVE 05/19/2021 2357   PROTEINUR 30 (A) 05/19/2021 2357   UROBILINOGEN 0.2 03/14/2010 0809   NITRITE NEGATIVE 05/19/2021 2357   LEUKOCYTESUR MODERATE (A) 05/19/2021 2357    Lab Results  Component Value Date   BACTERIA RARE (A) 05/19/2021    Pertinent Imaging:  No results found for this or any previous visit.  No results found for this or any previous visit.  No results found for this or any previous visit.  No results found for this or any previous visit.  Results for orders placed during the hospital encounter of 11/02/20  US RENAL  Narrative CLINICAL DATA:  Chronic kidney disease.  EXAM: RENAL / URINARY TRACT ULTRASOUND COMPLETE  COMPARISON:  December 10, 2019.  FINDINGS: Right Kidney:  Renal measurements: 8.7 x 4.9 x 4.0 cm = volume: 89 mL. Increased echogenicity of renal parenchyma is noted suggesting medical renal disease. No mass or hydronephrosis visualized.  Left Kidney:  Renal measurements: 10.8 x 5.0 x 4.5 cm = volume: 126 mL. Echogenicity within normal limits. No mass or hydronephrosis visualized.  Bladder:  Not visualized.  Other:  None.  IMPRESSION: Right renal atrophy is noted. Left kidney is unremarkable. No hydronephrosis or renal obstruction is  noted.   Electronically Signed By: Marijo Conception M.D. On: 11/03/2020 09:19  No results found for this or any previous visit.  No results found for this or any previous visit.  Results for orders placed during the hospital encounter of 05/19/21  CT RENAL STONE STUDY  Narrative CLINICAL DATA:  Difficulty urinating.  Hematuria.  EXAM: CT ABDOMEN AND PELVIS WITHOUT CONTRAST  TECHNIQUE: Multidetector CT imaging of the abdomen and pelvis was performed following the standard protocol without IV contrast.  COMPARISON:  05/29/2020  FINDINGS: Lower chest: Motion artifact limits evaluation of lung bases but there appears to be patchy airspace infiltration in the right lung, possibly pneumonia. Peripheral interstitial process, likely fibrosis or edema, in the lung bases. Minimal pleural effusions. Cardiac enlargement.  Hepatobiliary: No focal liver abnormality is seen. No gallstones, gallbladder wall thickening, or biliary dilatation.  Pancreas: Unremarkable. No pancreatic ductal dilatation or surrounding inflammatory changes.  Spleen: Normal in size without focal abnormality.  Adrenals/Urinary Tract: No adrenal gland nodules. Punctate stone in the upper pole of the right kidney. Mild bilateral hydronephrosis and hydroureter. No ureteral stones. Bladder is decompressed with a Foley catheter. Appearance is unchanged since prior study.  Stomach/Bowel: The stomach, small bowel, and colon are not abnormally distended. No wall thickening or inflammatory changes are appreciated. Diverticula in the sigmoid colon without evidence of diverticulitis. Appendix is normal.  Vascular/Lymphatic: Aortic atherosclerosis. No enlarged abdominal or pelvic lymph nodes.  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 Surgical clips in the pelvis. No significant pelvic lymphadenopathy.  Other: No free air or free fluid in the abdomen. Small left inguinal hernia containing  fat.  Musculoskeletal: Compression of L3, unchanged. No destructive bone lesions.  IMPRESSION: 1. Bilateral hydronephrosis and hydroureter. No obstructing stone or lesion identified. Bladder decompressed with a Foley catheter. Appearance is similar to prior study. 2. Mild patchy airspace disease in the right lung, possibly pneumonia. Peripheral interstitial process in the lung bases may represent  chronic fibrosis or edema. 3. Extensive abdominal aortic atherosclerosis. 4. Surgical absence of the prostate gland. No metastatic disease is appreciated.   Electronically Signed By: Lucienne Capers M.D. On: 05/20/2021 00:50   Assessment & Plan:    1. Gross hematuria -resolved  2. OAB (overactive bladder) -patient defers therapy at this time - BLADDER SCAN AMB NON-IMAGING  3. Benign prostatic hyperplasia with urinary obstruction -Continue flomax and finasteride  4. Urinary retention -continue flomax and finasteride   No follow-ups on file.  Nicolette Bang, MD  [facility] [place]

## 2021-06-06 ENCOUNTER — Other Ambulatory Visit: Payer: Self-pay

## 2021-06-06 ENCOUNTER — Encounter (HOSPITAL_COMMUNITY)
Admission: RE | Admit: 2021-06-06 | Discharge: 2021-06-06 | Disposition: A | Discharge status: Home / Self Care | Payer: MEDICARE | Source: Ambulatory Visit | Attending: Nephrology | Admitting: Nephrology

## 2021-06-06 DIAGNOSIS — N184 Chronic kidney disease, stage 4 (severe): Secondary | ICD-10-CM | POA: Insufficient documentation

## 2021-06-06 DIAGNOSIS — D631 Anemia in chronic kidney disease: Secondary | ICD-10-CM | POA: Insufficient documentation

## 2021-06-06 LAB — POCT HEMOGLOBIN-HEMACUE: Hemoglobin: 7.9 g/dL — ABNORMAL LOW (ref 13.0–17.0)

## 2021-06-06 MED ORDER — EPOETIN ALFA 3000 UNIT/ML IJ SOLN
INTRAMUSCULAR | Status: AC
  Filled 2021-06-06: qty 1

## 2021-06-06 MED ORDER — EPOETIN ALFA 3000 UNIT/ML IJ SOLN
3000.0000 [IU] | Freq: Once | INTRAMUSCULAR | Status: AC
  Administered 2021-06-06: 3000 [IU] via SUBCUTANEOUS

## 2021-06-06 NOTE — Progress Notes (Signed)
Pt is a very difficult stick. Attempted  x2 for blood draw. Unsuccessful. Will attempt again at next appt in 2 weeks. Enough blood obtained for Hgb.

## 2021-06-13 DIAGNOSIS — C61 Malignant neoplasm of prostate: Secondary | ICD-10-CM | POA: Diagnosis not present

## 2021-06-13 DIAGNOSIS — I5032 Chronic diastolic (congestive) heart failure: Secondary | ICD-10-CM | POA: Diagnosis not present

## 2021-06-13 DIAGNOSIS — I700 Atherosclerosis of aorta: Secondary | ICD-10-CM | POA: Diagnosis not present

## 2021-06-13 DIAGNOSIS — I10 Essential (primary) hypertension: Secondary | ICD-10-CM | POA: Diagnosis not present

## 2021-06-13 DIAGNOSIS — Z299 Encounter for prophylactic measures, unspecified: Secondary | ICD-10-CM | POA: Diagnosis not present

## 2021-06-13 DIAGNOSIS — F339 Major depressive disorder, recurrent, unspecified: Secondary | ICD-10-CM | POA: Diagnosis not present

## 2021-06-16 DIAGNOSIS — F32A Depression, unspecified: Secondary | ICD-10-CM | POA: Diagnosis not present

## 2021-06-16 DIAGNOSIS — Z8546 Personal history of malignant neoplasm of prostate: Secondary | ICD-10-CM | POA: Diagnosis not present

## 2021-06-16 DIAGNOSIS — I130 Hypertensive heart and chronic kidney disease with heart failure and stage 1 through stage 4 chronic kidney disease, or unspecified chronic kidney disease: Secondary | ICD-10-CM | POA: Diagnosis not present

## 2021-06-16 DIAGNOSIS — Z7982 Long term (current) use of aspirin: Secondary | ICD-10-CM | POA: Diagnosis not present

## 2021-06-16 DIAGNOSIS — D631 Anemia in chronic kidney disease: Secondary | ICD-10-CM | POA: Diagnosis not present

## 2021-06-16 DIAGNOSIS — Z8781 Personal history of (healed) traumatic fracture: Secondary | ICD-10-CM | POA: Diagnosis not present

## 2021-06-16 DIAGNOSIS — E785 Hyperlipidemia, unspecified: Secondary | ICD-10-CM | POA: Diagnosis not present

## 2021-06-16 DIAGNOSIS — N184 Chronic kidney disease, stage 4 (severe): Secondary | ICD-10-CM | POA: Diagnosis not present

## 2021-06-16 DIAGNOSIS — I5031 Acute diastolic (congestive) heart failure: Secondary | ICD-10-CM | POA: Diagnosis not present

## 2021-06-20 ENCOUNTER — Encounter (HOSPITAL_COMMUNITY)
Admission: RE | Admit: 2021-06-20 | Discharge: 2021-06-20 | Disposition: A | Discharge status: Home / Self Care | Payer: MEDICARE | Source: Ambulatory Visit | Attending: Nephrology | Admitting: Nephrology

## 2021-06-20 ENCOUNTER — Other Ambulatory Visit: Payer: Self-pay

## 2021-06-20 DIAGNOSIS — D631 Anemia in chronic kidney disease: Secondary | ICD-10-CM | POA: Diagnosis not present

## 2021-06-20 DIAGNOSIS — E871 Hypo-osmolality and hyponatremia: Secondary | ICD-10-CM | POA: Diagnosis not present

## 2021-06-20 DIAGNOSIS — N170 Acute kidney failure with tubular necrosis: Secondary | ICD-10-CM | POA: Diagnosis not present

## 2021-06-20 DIAGNOSIS — N189 Chronic kidney disease, unspecified: Secondary | ICD-10-CM | POA: Diagnosis not present

## 2021-06-20 DIAGNOSIS — N184 Chronic kidney disease, stage 4 (severe): Secondary | ICD-10-CM | POA: Diagnosis not present

## 2021-06-20 MED ORDER — EPOETIN ALFA 3000 UNIT/ML IJ SOLN
INTRAMUSCULAR | Status: AC
  Filled 2021-06-20: qty 1

## 2021-06-20 MED ORDER — EPOETIN ALFA 3000 UNIT/ML IJ SOLN
3000.0000 [IU] | Freq: Once | INTRAMUSCULAR | Status: AC
  Administered 2021-06-20: 3000 [IU] via SUBCUTANEOUS

## 2021-06-21 LAB — POCT HEMOGLOBIN-HEMACUE: Hemoglobin: 7.5 g/dL — ABNORMAL LOW (ref 13.0–17.0)

## 2021-06-29 ENCOUNTER — Other Ambulatory Visit (HOSPITAL_COMMUNITY)
Admission: RE | Admit: 2021-06-29 | Discharge: 2021-06-29 | Disposition: A | Discharge status: Home / Self Care | Payer: MEDICARE | Source: Ambulatory Visit | Attending: Nephrology | Admitting: Nephrology

## 2021-06-29 DIAGNOSIS — D631 Anemia in chronic kidney disease: Secondary | ICD-10-CM | POA: Diagnosis not present

## 2021-06-29 DIAGNOSIS — E871 Hypo-osmolality and hyponatremia: Secondary | ICD-10-CM | POA: Insufficient documentation

## 2021-06-29 DIAGNOSIS — R600 Localized edema: Secondary | ICD-10-CM | POA: Diagnosis not present

## 2021-06-29 DIAGNOSIS — N184 Chronic kidney disease, stage 4 (severe): Secondary | ICD-10-CM | POA: Diagnosis not present

## 2021-06-29 DIAGNOSIS — R809 Proteinuria, unspecified: Secondary | ICD-10-CM | POA: Diagnosis not present

## 2021-06-29 DIAGNOSIS — N189 Chronic kidney disease, unspecified: Secondary | ICD-10-CM | POA: Diagnosis not present

## 2021-06-29 DIAGNOSIS — E211 Secondary hyperparathyroidism, not elsewhere classified: Secondary | ICD-10-CM | POA: Diagnosis not present

## 2021-06-29 LAB — RENAL FUNCTION PANEL
Albumin: 3.4 g/dL — ABNORMAL LOW (ref 3.5–5.0)
Anion gap: 9 (ref 5–15)
BUN: 24 mg/dL — ABNORMAL HIGH (ref 8–23)
CO2: 23 mmol/L (ref 22–32)
Calcium: 8.3 mg/dL — ABNORMAL LOW (ref 8.9–10.3)
Chloride: 89 mmol/L — ABNORMAL LOW (ref 98–111)
Creatinine, Ser: 3.01 mg/dL — ABNORMAL HIGH (ref 0.61–1.24)
GFR, Estimated: 20 mL/min — ABNORMAL LOW (ref >60)
Glucose, Bld: 97 mg/dL (ref 70–99)
Phosphorus: 3.3 mg/dL (ref 2.5–4.6)
Potassium: 4.2 mmol/L (ref 3.5–5.1)
Sodium: 121 mmol/L — ABNORMAL LOW (ref 135–145)

## 2021-07-04 ENCOUNTER — Other Ambulatory Visit: Payer: Self-pay

## 2021-07-04 ENCOUNTER — Encounter (HOSPITAL_COMMUNITY): Payer: Self-pay

## 2021-07-04 ENCOUNTER — Encounter (HOSPITAL_COMMUNITY)
Admission: RE | Admit: 2021-07-04 | Discharge: 2021-07-04 | Disposition: A | Discharge status: Home / Self Care | Payer: MEDICARE | Source: Ambulatory Visit | Attending: Nephrology | Admitting: Nephrology

## 2021-07-04 DIAGNOSIS — D631 Anemia in chronic kidney disease: Secondary | ICD-10-CM | POA: Diagnosis not present

## 2021-07-04 DIAGNOSIS — N184 Chronic kidney disease, stage 4 (severe): Secondary | ICD-10-CM | POA: Insufficient documentation

## 2021-07-04 LAB — PROTEIN / CREATININE RATIO, URINE
Creatinine, Urine: 45.28 mg/dL
Protein Creatinine Ratio: 3.07 mg/mg{Cre} — ABNORMAL HIGH (ref 0.00–0.15)
Total Protein, Urine: 139 mg/dL

## 2021-07-04 LAB — CBC WITH DIFFERENTIAL/PLATELET
Abs Immature Granulocytes: 0.01 10*3/uL (ref 0.00–0.07)
Basophils Absolute: 0 10*3/uL (ref 0.0–0.1)
Basophils Relative: 0 %
Eosinophils Absolute: 0.2 10*3/uL (ref 0.0–0.5)
Eosinophils Relative: 4 %
HCT: 24.4 % — ABNORMAL LOW (ref 39.0–52.0)
Hemoglobin: 8.2 g/dL — ABNORMAL LOW (ref 13.0–17.0)
Immature Granulocytes: 0 %
Lymphocytes Relative: 7 %
Lymphs Abs: 0.4 10*3/uL — ABNORMAL LOW (ref 0.7–4.0)
MCH: 30.7 pg (ref 26.0–34.0)
MCHC: 33.6 g/dL (ref 30.0–36.0)
MCV: 91.4 fL (ref 80.0–100.0)
Monocytes Absolute: 0.4 10*3/uL (ref 0.1–1.0)
Monocytes Relative: 7 %
Neutro Abs: 4.2 10*3/uL (ref 1.7–7.7)
Neutrophils Relative %: 82 %
Platelets: 135 10*3/uL — ABNORMAL LOW (ref 150–400)
RBC: 2.67 MIL/uL — ABNORMAL LOW (ref 4.22–5.81)
RDW: 14.6 % (ref 11.5–15.5)
WBC: 5.2 10*3/uL (ref 4.0–10.5)
nRBC: 0 % (ref 0.0–0.2)

## 2021-07-04 LAB — IRON AND TIBC
Iron: 42 ug/dL — ABNORMAL LOW (ref 45–182)
Saturation Ratios: 16 % — ABNORMAL LOW (ref 17.9–39.5)
TIBC: 261 ug/dL (ref 250–450)
UIBC: 219 ug/dL

## 2021-07-04 LAB — RENAL FUNCTION PANEL
Albumin: 3.6 g/dL (ref 3.5–5.0)
Anion gap: 9 (ref 5–15)
BUN: 25 mg/dL — ABNORMAL HIGH (ref 8–23)
CO2: 25 mmol/L (ref 22–32)
Calcium: 8.3 mg/dL — ABNORMAL LOW (ref 8.9–10.3)
Chloride: 88 mmol/L — ABNORMAL LOW (ref 98–111)
Creatinine, Ser: 2.99 mg/dL — ABNORMAL HIGH (ref 0.61–1.24)
GFR, Estimated: 20 mL/min — ABNORMAL LOW (ref >60)
Glucose, Bld: 121 mg/dL — ABNORMAL HIGH (ref 70–99)
Phosphorus: 3.4 mg/dL (ref 2.5–4.6)
Potassium: 3.8 mmol/L (ref 3.5–5.1)
Sodium: 122 mmol/L — ABNORMAL LOW (ref 135–145)

## 2021-07-04 LAB — POCT HEMOGLOBIN-HEMACUE: Hemoglobin: 8.4 g/dL — ABNORMAL LOW (ref 13.0–17.0)

## 2021-07-04 MED ORDER — EPOETIN ALFA 3000 UNIT/ML IJ SOLN: 3000.0000 [IU] | Freq: Once | INTRAMUSCULAR | Status: AC

## 2021-07-04 MED ORDER — EPOETIN ALFA-EPBX 3000 UNIT/ML IJ SOLN
INTRAMUSCULAR | Status: AC
  Administered 2021-07-04: 3000 [IU]
  Filled 2021-07-04: qty 1

## 2021-07-05 LAB — PTH, INTACT AND CALCIUM
Calcium, Total (PTH): 8.6 mg/dL (ref 8.6–10.2)
PTH: 106 pg/mL — ABNORMAL HIGH (ref 15–65)

## 2021-07-13 ENCOUNTER — Other Ambulatory Visit (HOSPITAL_COMMUNITY)
Admission: RE | Admit: 2021-07-13 | Discharge: 2021-07-13 | Disposition: A | Discharge status: Home / Self Care | Payer: MEDICARE | Source: Ambulatory Visit | Attending: Nephrology | Admitting: Nephrology

## 2021-07-13 DIAGNOSIS — N189 Chronic kidney disease, unspecified: Secondary | ICD-10-CM | POA: Diagnosis not present

## 2021-07-13 DIAGNOSIS — R600 Localized edema: Secondary | ICD-10-CM | POA: Diagnosis not present

## 2021-07-13 DIAGNOSIS — E871 Hypo-osmolality and hyponatremia: Secondary | ICD-10-CM | POA: Insufficient documentation

## 2021-07-13 DIAGNOSIS — E211 Secondary hyperparathyroidism, not elsewhere classified: Secondary | ICD-10-CM | POA: Diagnosis not present

## 2021-07-13 DIAGNOSIS — D508 Other iron deficiency anemias: Secondary | ICD-10-CM | POA: Diagnosis not present

## 2021-07-13 DIAGNOSIS — R809 Proteinuria, unspecified: Secondary | ICD-10-CM | POA: Diagnosis not present

## 2021-07-13 DIAGNOSIS — D631 Anemia in chronic kidney disease: Secondary | ICD-10-CM | POA: Insufficient documentation

## 2021-07-13 DIAGNOSIS — N184 Chronic kidney disease, stage 4 (severe): Secondary | ICD-10-CM | POA: Insufficient documentation

## 2021-07-13 LAB — RENAL FUNCTION PANEL
Albumin: 3.7 g/dL (ref 3.5–5.0)
Anion gap: 8 (ref 5–15)
BUN: 32 mg/dL — ABNORMAL HIGH (ref 8–23)
CO2: 27 mmol/L (ref 22–32)
Calcium: 9 mg/dL (ref 8.9–10.3)
Chloride: 92 mmol/L — ABNORMAL LOW (ref 98–111)
Creatinine, Ser: 3.12 mg/dL — ABNORMAL HIGH (ref 0.61–1.24)
GFR, Estimated: 19 mL/min — ABNORMAL LOW (ref >60)
Glucose, Bld: 102 mg/dL — ABNORMAL HIGH (ref 70–99)
Phosphorus: 3.9 mg/dL (ref 2.5–4.6)
Potassium: 4.1 mmol/L (ref 3.5–5.1)
Sodium: 127 mmol/L — ABNORMAL LOW (ref 135–145)

## 2021-07-13 LAB — CBC
HCT: 25.4 % — ABNORMAL LOW (ref 39.0–52.0)
Hemoglobin: 8.3 g/dL — ABNORMAL LOW (ref 13.0–17.0)
MCH: 30.4 pg (ref 26.0–34.0)
MCHC: 32.7 g/dL (ref 30.0–36.0)
MCV: 93 fL (ref 80.0–100.0)
Platelets: 130 10*3/uL — ABNORMAL LOW (ref 150–400)
RBC: 2.73 MIL/uL — ABNORMAL LOW (ref 4.22–5.81)
RDW: 14.9 % (ref 11.5–15.5)
WBC: 4.4 10*3/uL (ref 4.0–10.5)
nRBC: 0 % (ref 0.0–0.2)

## 2021-07-17 ENCOUNTER — Encounter: Payer: Self-pay | Admitting: Internal Medicine

## 2021-07-18 ENCOUNTER — Encounter (HOSPITAL_COMMUNITY)
Admission: RE | Admit: 2021-07-18 | Discharge: 2021-07-18 | Disposition: A | Discharge status: Home / Self Care | Payer: MEDICARE | Source: Ambulatory Visit | Attending: Nephrology | Admitting: Nephrology

## 2021-07-18 DIAGNOSIS — Z7982 Long term (current) use of aspirin: Secondary | ICD-10-CM | POA: Diagnosis not present

## 2021-07-18 DIAGNOSIS — Z79899 Other long term (current) drug therapy: Secondary | ICD-10-CM | POA: Diagnosis not present

## 2021-07-18 DIAGNOSIS — I5031 Acute diastolic (congestive) heart failure: Secondary | ICD-10-CM | POA: Diagnosis not present

## 2021-07-18 DIAGNOSIS — E782 Mixed hyperlipidemia: Secondary | ICD-10-CM | POA: Diagnosis not present

## 2021-07-18 DIAGNOSIS — Z20822 Contact with and (suspected) exposure to covid-19: Secondary | ICD-10-CM | POA: Diagnosis not present

## 2021-07-18 DIAGNOSIS — R059 Cough, unspecified: Secondary | ICD-10-CM | POA: Diagnosis not present

## 2021-07-18 DIAGNOSIS — I517 Cardiomegaly: Secondary | ICD-10-CM | POA: Diagnosis not present

## 2021-07-18 DIAGNOSIS — I5033 Acute on chronic diastolic (congestive) heart failure: Secondary | ICD-10-CM | POA: Diagnosis not present

## 2021-07-18 DIAGNOSIS — J189 Pneumonia, unspecified organism: Secondary | ICD-10-CM | POA: Diagnosis not present

## 2021-07-18 DIAGNOSIS — Z951 Presence of aortocoronary bypass graft: Secondary | ICD-10-CM | POA: Diagnosis not present

## 2021-07-18 DIAGNOSIS — J90 Pleural effusion, not elsewhere classified: Secondary | ICD-10-CM | POA: Diagnosis not present

## 2021-07-18 DIAGNOSIS — E785 Hyperlipidemia, unspecified: Secondary | ICD-10-CM | POA: Diagnosis not present

## 2021-07-18 DIAGNOSIS — Z299 Encounter for prophylactic measures, unspecified: Secondary | ICD-10-CM | POA: Diagnosis not present

## 2021-07-18 DIAGNOSIS — I1311 Hypertensive heart and chronic kidney disease without heart failure, with stage 5 chronic kidney disease, or end stage renal disease: Secondary | ICD-10-CM | POA: Diagnosis not present

## 2021-07-18 DIAGNOSIS — J449 Chronic obstructive pulmonary disease, unspecified: Secondary | ICD-10-CM | POA: Diagnosis not present

## 2021-07-18 DIAGNOSIS — I5032 Chronic diastolic (congestive) heart failure: Secondary | ICD-10-CM | POA: Diagnosis not present

## 2021-07-18 DIAGNOSIS — Z885 Allergy status to narcotic agent status: Secondary | ICD-10-CM | POA: Diagnosis not present

## 2021-07-18 DIAGNOSIS — I132 Hypertensive heart and chronic kidney disease with heart failure and with stage 5 chronic kidney disease, or end stage renal disease: Secondary | ICD-10-CM | POA: Diagnosis not present

## 2021-07-18 DIAGNOSIS — R7881 Bacteremia: Secondary | ICD-10-CM | POA: Diagnosis not present

## 2021-07-18 DIAGNOSIS — E871 Hypo-osmolality and hyponatremia: Secondary | ICD-10-CM | POA: Diagnosis not present

## 2021-07-18 DIAGNOSIS — D631 Anemia in chronic kidney disease: Secondary | ICD-10-CM | POA: Diagnosis not present

## 2021-07-18 DIAGNOSIS — I509 Heart failure, unspecified: Secondary | ICD-10-CM | POA: Diagnosis not present

## 2021-07-18 DIAGNOSIS — N185 Chronic kidney disease, stage 5: Secondary | ICD-10-CM | POA: Diagnosis not present

## 2021-07-18 DIAGNOSIS — I5023 Acute on chronic systolic (congestive) heart failure: Secondary | ICD-10-CM | POA: Diagnosis not present

## 2021-07-18 DIAGNOSIS — I10 Essential (primary) hypertension: Secondary | ICD-10-CM | POA: Diagnosis not present

## 2021-07-18 DIAGNOSIS — F32A Depression, unspecified: Secondary | ICD-10-CM | POA: Diagnosis not present

## 2021-07-18 DIAGNOSIS — Z792 Long term (current) use of antibiotics: Secondary | ICD-10-CM | POA: Diagnosis not present

## 2021-07-18 DIAGNOSIS — Z91041 Radiographic dye allergy status: Secondary | ICD-10-CM | POA: Diagnosis not present

## 2021-07-18 DIAGNOSIS — D649 Anemia, unspecified: Secondary | ICD-10-CM | POA: Diagnosis not present

## 2021-07-18 DIAGNOSIS — M47812 Spondylosis without myelopathy or radiculopathy, cervical region: Secondary | ICD-10-CM | POA: Diagnosis not present

## 2021-07-18 MED ORDER — SODIUM CHLORIDE 0.9 % IV SOLN: INTRAVENOUS | Status: DC

## 2021-07-18 MED ORDER — SODIUM CHLORIDE 0.9 % IV SOLN
510.0000 mg | INTRAVENOUS | Status: DC
  Filled 2021-07-18: qty 17

## 2021-07-25 ENCOUNTER — Encounter (HOSPITAL_COMMUNITY)
Admission: RE | Admit: 2021-07-25 | Discharge: 2021-07-25 | Disposition: A | Discharge status: Home / Self Care | Payer: MEDICARE | Source: Ambulatory Visit | Attending: Nephrology | Admitting: Nephrology

## 2021-07-25 MED ORDER — SODIUM CHLORIDE 0.9 % IV SOLN
510.0000 mg | Freq: Once | INTRAVENOUS | Status: DC
  Filled 2021-07-25: qty 17

## 2021-07-26 DIAGNOSIS — M1990 Unspecified osteoarthritis, unspecified site: Secondary | ICD-10-CM | POA: Diagnosis not present

## 2021-07-26 DIAGNOSIS — I5033 Acute on chronic diastolic (congestive) heart failure: Secondary | ICD-10-CM | POA: Diagnosis not present

## 2021-07-26 DIAGNOSIS — R279 Unspecified lack of coordination: Secondary | ICD-10-CM | POA: Diagnosis not present

## 2021-07-26 DIAGNOSIS — M542 Cervicalgia: Secondary | ICD-10-CM | POA: Diagnosis not present

## 2021-07-26 DIAGNOSIS — F32A Depression, unspecified: Secondary | ICD-10-CM | POA: Diagnosis not present

## 2021-07-26 DIAGNOSIS — Z9181 History of falling: Secondary | ICD-10-CM | POA: Diagnosis not present

## 2021-07-26 DIAGNOSIS — I132 Hypertensive heart and chronic kidney disease with heart failure and with stage 5 chronic kidney disease, or end stage renal disease: Secondary | ICD-10-CM | POA: Diagnosis not present

## 2021-07-26 DIAGNOSIS — D631 Anemia in chronic kidney disease: Secondary | ICD-10-CM | POA: Diagnosis not present

## 2021-07-26 DIAGNOSIS — E871 Hypo-osmolality and hyponatremia: Secondary | ICD-10-CM | POA: Diagnosis not present

## 2021-07-26 DIAGNOSIS — Z7982 Long term (current) use of aspirin: Secondary | ICD-10-CM | POA: Diagnosis not present

## 2021-07-26 DIAGNOSIS — I480 Paroxysmal atrial fibrillation: Secondary | ICD-10-CM | POA: Diagnosis not present

## 2021-07-26 DIAGNOSIS — I5031 Acute diastolic (congestive) heart failure: Secondary | ICD-10-CM | POA: Diagnosis not present

## 2021-07-26 DIAGNOSIS — I5021 Acute systolic (congestive) heart failure: Secondary | ICD-10-CM | POA: Diagnosis not present

## 2021-07-26 DIAGNOSIS — E785 Hyperlipidemia, unspecified: Secondary | ICD-10-CM | POA: Diagnosis not present

## 2021-07-26 DIAGNOSIS — Z79891 Long term (current) use of opiate analgesic: Secondary | ICD-10-CM | POA: Diagnosis not present

## 2021-07-26 DIAGNOSIS — Z9981 Dependence on supplemental oxygen: Secondary | ICD-10-CM | POA: Diagnosis not present

## 2021-07-26 DIAGNOSIS — N185 Chronic kidney disease, stage 5: Secondary | ICD-10-CM | POA: Diagnosis not present

## 2021-07-27 DIAGNOSIS — D631 Anemia in chronic kidney disease: Secondary | ICD-10-CM | POA: Diagnosis not present

## 2021-07-27 DIAGNOSIS — I5033 Acute on chronic diastolic (congestive) heart failure: Secondary | ICD-10-CM | POA: Diagnosis not present

## 2021-07-27 DIAGNOSIS — I132 Hypertensive heart and chronic kidney disease with heart failure and with stage 5 chronic kidney disease, or end stage renal disease: Secondary | ICD-10-CM | POA: Diagnosis not present

## 2021-07-27 DIAGNOSIS — E871 Hypo-osmolality and hyponatremia: Secondary | ICD-10-CM | POA: Diagnosis not present

## 2021-07-27 DIAGNOSIS — N185 Chronic kidney disease, stage 5: Secondary | ICD-10-CM | POA: Diagnosis not present

## 2021-07-27 DIAGNOSIS — I480 Paroxysmal atrial fibrillation: Secondary | ICD-10-CM | POA: Diagnosis not present

## 2021-07-31 DIAGNOSIS — E871 Hypo-osmolality and hyponatremia: Secondary | ICD-10-CM | POA: Diagnosis not present

## 2021-07-31 DIAGNOSIS — I132 Hypertensive heart and chronic kidney disease with heart failure and with stage 5 chronic kidney disease, or end stage renal disease: Secondary | ICD-10-CM | POA: Diagnosis not present

## 2021-07-31 DIAGNOSIS — I480 Paroxysmal atrial fibrillation: Secondary | ICD-10-CM | POA: Diagnosis not present

## 2021-07-31 DIAGNOSIS — N185 Chronic kidney disease, stage 5: Secondary | ICD-10-CM | POA: Diagnosis not present

## 2021-07-31 DIAGNOSIS — D631 Anemia in chronic kidney disease: Secondary | ICD-10-CM | POA: Diagnosis not present

## 2021-07-31 DIAGNOSIS — I5033 Acute on chronic diastolic (congestive) heart failure: Secondary | ICD-10-CM | POA: Diagnosis not present

## 2021-08-01 ENCOUNTER — Ambulatory Visit: Payer: No Typology Code available for payment source | Admitting: Urology

## 2021-08-01 DIAGNOSIS — I5033 Acute on chronic diastolic (congestive) heart failure: Secondary | ICD-10-CM | POA: Diagnosis not present

## 2021-08-01 DIAGNOSIS — N401 Enlarged prostate with lower urinary tract symptoms: Secondary | ICD-10-CM

## 2021-08-01 DIAGNOSIS — D631 Anemia in chronic kidney disease: Secondary | ICD-10-CM | POA: Diagnosis not present

## 2021-08-01 DIAGNOSIS — R339 Retention of urine, unspecified: Secondary | ICD-10-CM

## 2021-08-01 DIAGNOSIS — N3281 Overactive bladder: Secondary | ICD-10-CM

## 2021-08-01 DIAGNOSIS — I480 Paroxysmal atrial fibrillation: Secondary | ICD-10-CM | POA: Diagnosis not present

## 2021-08-01 DIAGNOSIS — E871 Hypo-osmolality and hyponatremia: Secondary | ICD-10-CM | POA: Diagnosis not present

## 2021-08-01 DIAGNOSIS — N185 Chronic kidney disease, stage 5: Secondary | ICD-10-CM | POA: Diagnosis not present

## 2021-08-01 DIAGNOSIS — I132 Hypertensive heart and chronic kidney disease with heart failure and with stage 5 chronic kidney disease, or end stage renal disease: Secondary | ICD-10-CM | POA: Diagnosis not present

## 2021-08-03 DIAGNOSIS — N185 Chronic kidney disease, stage 5: Secondary | ICD-10-CM | POA: Diagnosis not present

## 2021-08-03 DIAGNOSIS — E871 Hypo-osmolality and hyponatremia: Secondary | ICD-10-CM | POA: Diagnosis not present

## 2021-08-03 DIAGNOSIS — I5033 Acute on chronic diastolic (congestive) heart failure: Secondary | ICD-10-CM | POA: Diagnosis not present

## 2021-08-03 DIAGNOSIS — I480 Paroxysmal atrial fibrillation: Secondary | ICD-10-CM | POA: Diagnosis not present

## 2021-08-03 DIAGNOSIS — I132 Hypertensive heart and chronic kidney disease with heart failure and with stage 5 chronic kidney disease, or end stage renal disease: Secondary | ICD-10-CM | POA: Diagnosis not present

## 2021-08-03 DIAGNOSIS — D631 Anemia in chronic kidney disease: Secondary | ICD-10-CM | POA: Diagnosis not present

## 2021-08-07 DIAGNOSIS — I5033 Acute on chronic diastolic (congestive) heart failure: Secondary | ICD-10-CM | POA: Diagnosis not present

## 2021-08-07 DIAGNOSIS — I132 Hypertensive heart and chronic kidney disease with heart failure and with stage 5 chronic kidney disease, or end stage renal disease: Secondary | ICD-10-CM | POA: Diagnosis not present

## 2021-08-07 DIAGNOSIS — E871 Hypo-osmolality and hyponatremia: Secondary | ICD-10-CM | POA: Diagnosis not present

## 2021-08-07 DIAGNOSIS — N185 Chronic kidney disease, stage 5: Secondary | ICD-10-CM | POA: Diagnosis not present

## 2021-08-07 DIAGNOSIS — D631 Anemia in chronic kidney disease: Secondary | ICD-10-CM | POA: Diagnosis not present

## 2021-08-07 DIAGNOSIS — I480 Paroxysmal atrial fibrillation: Secondary | ICD-10-CM | POA: Diagnosis not present

## 2021-08-08 ENCOUNTER — Other Ambulatory Visit: Payer: Self-pay

## 2021-08-08 ENCOUNTER — Encounter (HOSPITAL_COMMUNITY)
Admission: RE | Admit: 2021-08-08 | Discharge: 2021-08-08 | Disposition: A | Discharge status: Home / Self Care | Payer: MEDICARE | Source: Ambulatory Visit | Attending: Nephrology | Admitting: Nephrology

## 2021-08-08 DIAGNOSIS — E871 Hypo-osmolality and hyponatremia: Secondary | ICD-10-CM | POA: Diagnosis not present

## 2021-08-08 DIAGNOSIS — I5033 Acute on chronic diastolic (congestive) heart failure: Secondary | ICD-10-CM | POA: Diagnosis not present

## 2021-08-08 DIAGNOSIS — D631 Anemia in chronic kidney disease: Secondary | ICD-10-CM | POA: Diagnosis not present

## 2021-08-08 DIAGNOSIS — I132 Hypertensive heart and chronic kidney disease with heart failure and with stage 5 chronic kidney disease, or end stage renal disease: Secondary | ICD-10-CM | POA: Diagnosis not present

## 2021-08-08 DIAGNOSIS — N184 Chronic kidney disease, stage 4 (severe): Secondary | ICD-10-CM | POA: Diagnosis not present

## 2021-08-08 DIAGNOSIS — I480 Paroxysmal atrial fibrillation: Secondary | ICD-10-CM | POA: Diagnosis not present

## 2021-08-08 DIAGNOSIS — N185 Chronic kidney disease, stage 5: Secondary | ICD-10-CM | POA: Diagnosis not present

## 2021-08-08 LAB — RENAL FUNCTION PANEL
Albumin: 3.6 g/dL (ref 3.5–5.0)
Anion gap: 10 (ref 5–15)
BUN: 32 mg/dL — ABNORMAL HIGH (ref 8–23)
CO2: 22 mmol/L (ref 22–32)
Calcium: 9.1 mg/dL (ref 8.9–10.3)
Chloride: 102 mmol/L (ref 98–111)
Creatinine, Ser: 3.24 mg/dL — ABNORMAL HIGH (ref 0.61–1.24)
GFR, Estimated: 18 mL/min — ABNORMAL LOW (ref >60)
Glucose, Bld: 100 mg/dL — ABNORMAL HIGH (ref 70–99)
Phosphorus: 3.9 mg/dL (ref 2.5–4.6)
Potassium: 4.6 mmol/L (ref 3.5–5.1)
Sodium: 134 mmol/L — ABNORMAL LOW (ref 135–145)

## 2021-08-08 LAB — IRON AND TIBC
Iron: 273 ug/dL — ABNORMAL HIGH (ref 45–182)
Saturation Ratios: 118 % — ABNORMAL HIGH (ref 17.9–39.5)
TIBC: 232 ug/dL — ABNORMAL LOW (ref 250–450)

## 2021-08-08 LAB — CBC
HCT: 30.9 % — ABNORMAL LOW (ref 39.0–52.0)
Hemoglobin: 9.8 g/dL — ABNORMAL LOW (ref 13.0–17.0)
MCH: 30.5 pg (ref 26.0–34.0)
MCHC: 31.7 g/dL (ref 30.0–36.0)
MCV: 96.3 fL (ref 80.0–100.0)
Platelets: 123 10*3/uL — ABNORMAL LOW (ref 150–400)
RBC: 3.21 MIL/uL — ABNORMAL LOW (ref 4.22–5.81)
RDW: 14.5 % (ref 11.5–15.5)
WBC: 6.1 10*3/uL (ref 4.0–10.5)
nRBC: 0 % (ref 0.0–0.2)

## 2021-08-08 LAB — PROTEIN / CREATININE RATIO, URINE
Creatinine, Urine: 67.01 mg/dL
Protein Creatinine Ratio: 3.31 mg/mg{Cre} — ABNORMAL HIGH (ref 0.00–0.15)
Total Protein, Urine: 222 mg/dL

## 2021-08-08 LAB — POCT HEMOGLOBIN-HEMACUE: Hemoglobin: 9.4 g/dL — ABNORMAL LOW (ref 13.0–17.0)

## 2021-08-08 MED ORDER — SODIUM CHLORIDE 0.9 % IV SOLN: Freq: Once | INTRAVENOUS | Status: AC

## 2021-08-08 MED ORDER — SODIUM CHLORIDE 0.9 % IV SOLN
510.0000 mg | Freq: Once | INTRAVENOUS | Status: AC
  Administered 2021-08-08: 510 mg via INTRAVENOUS
  Filled 2021-08-08: qty 510

## 2021-08-08 MED ORDER — EPOETIN ALFA 3000 UNIT/ML IJ SOLN
3000.0000 [IU] | INTRAMUSCULAR | Status: DC
  Administered 2021-08-08: 3000 [IU] via SUBCUTANEOUS
  Filled 2021-08-08: qty 1

## 2021-08-09 DIAGNOSIS — I5033 Acute on chronic diastolic (congestive) heart failure: Secondary | ICD-10-CM | POA: Diagnosis not present

## 2021-08-09 DIAGNOSIS — N185 Chronic kidney disease, stage 5: Secondary | ICD-10-CM | POA: Diagnosis not present

## 2021-08-09 DIAGNOSIS — E871 Hypo-osmolality and hyponatremia: Secondary | ICD-10-CM | POA: Diagnosis not present

## 2021-08-09 DIAGNOSIS — D631 Anemia in chronic kidney disease: Secondary | ICD-10-CM | POA: Diagnosis not present

## 2021-08-09 DIAGNOSIS — I480 Paroxysmal atrial fibrillation: Secondary | ICD-10-CM | POA: Diagnosis not present

## 2021-08-09 DIAGNOSIS — I132 Hypertensive heart and chronic kidney disease with heart failure and with stage 5 chronic kidney disease, or end stage renal disease: Secondary | ICD-10-CM | POA: Diagnosis not present

## 2021-08-09 LAB — PTH, INTACT AND CALCIUM
Calcium, Total (PTH): 9.3 mg/dL (ref 8.6–10.2)
PTH: 17 pg/mL (ref 15–65)

## 2021-08-10 DIAGNOSIS — I480 Paroxysmal atrial fibrillation: Secondary | ICD-10-CM | POA: Diagnosis not present

## 2021-08-10 DIAGNOSIS — N189 Chronic kidney disease, unspecified: Secondary | ICD-10-CM | POA: Diagnosis not present

## 2021-08-10 DIAGNOSIS — D508 Other iron deficiency anemias: Secondary | ICD-10-CM | POA: Diagnosis not present

## 2021-08-10 DIAGNOSIS — N185 Chronic kidney disease, stage 5: Secondary | ICD-10-CM | POA: Diagnosis not present

## 2021-08-10 DIAGNOSIS — I132 Hypertensive heart and chronic kidney disease with heart failure and with stage 5 chronic kidney disease, or end stage renal disease: Secondary | ICD-10-CM | POA: Diagnosis not present

## 2021-08-10 DIAGNOSIS — R809 Proteinuria, unspecified: Secondary | ICD-10-CM | POA: Diagnosis not present

## 2021-08-10 DIAGNOSIS — I5033 Acute on chronic diastolic (congestive) heart failure: Secondary | ICD-10-CM | POA: Diagnosis not present

## 2021-08-10 DIAGNOSIS — N184 Chronic kidney disease, stage 4 (severe): Secondary | ICD-10-CM | POA: Diagnosis not present

## 2021-08-10 DIAGNOSIS — E871 Hypo-osmolality and hyponatremia: Secondary | ICD-10-CM | POA: Diagnosis not present

## 2021-08-10 DIAGNOSIS — D631 Anemia in chronic kidney disease: Secondary | ICD-10-CM | POA: Diagnosis not present

## 2021-08-10 DIAGNOSIS — I129 Hypertensive chronic kidney disease with stage 1 through stage 4 chronic kidney disease, or unspecified chronic kidney disease: Secondary | ICD-10-CM | POA: Diagnosis not present

## 2021-08-13 DIAGNOSIS — I132 Hypertensive heart and chronic kidney disease with heart failure and with stage 5 chronic kidney disease, or end stage renal disease: Secondary | ICD-10-CM | POA: Diagnosis not present

## 2021-08-13 DIAGNOSIS — D631 Anemia in chronic kidney disease: Secondary | ICD-10-CM | POA: Diagnosis not present

## 2021-08-13 DIAGNOSIS — N185 Chronic kidney disease, stage 5: Secondary | ICD-10-CM | POA: Diagnosis not present

## 2021-08-13 DIAGNOSIS — E871 Hypo-osmolality and hyponatremia: Secondary | ICD-10-CM | POA: Diagnosis not present

## 2021-08-13 DIAGNOSIS — I5033 Acute on chronic diastolic (congestive) heart failure: Secondary | ICD-10-CM | POA: Diagnosis not present

## 2021-08-13 DIAGNOSIS — I480 Paroxysmal atrial fibrillation: Secondary | ICD-10-CM | POA: Diagnosis not present

## 2021-08-15 ENCOUNTER — Encounter (HOSPITAL_COMMUNITY): Payer: Self-pay

## 2021-08-15 ENCOUNTER — Other Ambulatory Visit: Payer: Self-pay

## 2021-08-15 ENCOUNTER — Encounter (HOSPITAL_COMMUNITY)
Admission: RE | Admit: 2021-08-15 | Discharge: 2021-08-15 | Disposition: A | Discharge status: Home / Self Care | Payer: MEDICARE | Source: Ambulatory Visit | Attending: Nephrology | Admitting: Nephrology

## 2021-08-15 DIAGNOSIS — N185 Chronic kidney disease, stage 5: Secondary | ICD-10-CM | POA: Diagnosis not present

## 2021-08-15 DIAGNOSIS — I480 Paroxysmal atrial fibrillation: Secondary | ICD-10-CM | POA: Diagnosis not present

## 2021-08-15 DIAGNOSIS — D631 Anemia in chronic kidney disease: Secondary | ICD-10-CM | POA: Diagnosis not present

## 2021-08-15 DIAGNOSIS — I132 Hypertensive heart and chronic kidney disease with heart failure and with stage 5 chronic kidney disease, or end stage renal disease: Secondary | ICD-10-CM | POA: Diagnosis not present

## 2021-08-15 DIAGNOSIS — E871 Hypo-osmolality and hyponatremia: Secondary | ICD-10-CM | POA: Diagnosis not present

## 2021-08-15 DIAGNOSIS — N184 Chronic kidney disease, stage 4 (severe): Secondary | ICD-10-CM | POA: Diagnosis not present

## 2021-08-15 DIAGNOSIS — I5033 Acute on chronic diastolic (congestive) heart failure: Secondary | ICD-10-CM | POA: Diagnosis not present

## 2021-08-15 MED ORDER — SODIUM CHLORIDE 0.9 % IV SOLN
510.0000 mg | Freq: Once | INTRAVENOUS | Status: AC
  Administered 2021-08-15: 510 mg via INTRAVENOUS
  Filled 2021-08-15: qty 510

## 2021-08-15 MED ORDER — SODIUM CHLORIDE 0.9 % IV SOLN
INTRAVENOUS | Status: DC
Start: 2021-08-15 — End: 2021-08-16

## 2021-08-16 DIAGNOSIS — I480 Paroxysmal atrial fibrillation: Secondary | ICD-10-CM | POA: Diagnosis not present

## 2021-08-16 DIAGNOSIS — E871 Hypo-osmolality and hyponatremia: Secondary | ICD-10-CM | POA: Diagnosis not present

## 2021-08-16 DIAGNOSIS — I132 Hypertensive heart and chronic kidney disease with heart failure and with stage 5 chronic kidney disease, or end stage renal disease: Secondary | ICD-10-CM | POA: Diagnosis not present

## 2021-08-16 DIAGNOSIS — D631 Anemia in chronic kidney disease: Secondary | ICD-10-CM | POA: Diagnosis not present

## 2021-08-16 DIAGNOSIS — N185 Chronic kidney disease, stage 5: Secondary | ICD-10-CM | POA: Diagnosis not present

## 2021-08-16 DIAGNOSIS — I5033 Acute on chronic diastolic (congestive) heart failure: Secondary | ICD-10-CM | POA: Diagnosis not present

## 2021-08-17 DIAGNOSIS — I132 Hypertensive heart and chronic kidney disease with heart failure and with stage 5 chronic kidney disease, or end stage renal disease: Secondary | ICD-10-CM | POA: Diagnosis not present

## 2021-08-17 DIAGNOSIS — I480 Paroxysmal atrial fibrillation: Secondary | ICD-10-CM | POA: Diagnosis not present

## 2021-08-17 DIAGNOSIS — E871 Hypo-osmolality and hyponatremia: Secondary | ICD-10-CM | POA: Diagnosis not present

## 2021-08-17 DIAGNOSIS — N185 Chronic kidney disease, stage 5: Secondary | ICD-10-CM | POA: Diagnosis not present

## 2021-08-17 DIAGNOSIS — D631 Anemia in chronic kidney disease: Secondary | ICD-10-CM | POA: Diagnosis not present

## 2021-08-17 DIAGNOSIS — I5033 Acute on chronic diastolic (congestive) heart failure: Secondary | ICD-10-CM | POA: Diagnosis not present

## 2021-08-20 DIAGNOSIS — I5033 Acute on chronic diastolic (congestive) heart failure: Secondary | ICD-10-CM | POA: Diagnosis not present

## 2021-08-20 DIAGNOSIS — I132 Hypertensive heart and chronic kidney disease with heart failure and with stage 5 chronic kidney disease, or end stage renal disease: Secondary | ICD-10-CM | POA: Diagnosis not present

## 2021-08-20 DIAGNOSIS — E871 Hypo-osmolality and hyponatremia: Secondary | ICD-10-CM | POA: Diagnosis not present

## 2021-08-20 DIAGNOSIS — D631 Anemia in chronic kidney disease: Secondary | ICD-10-CM | POA: Diagnosis not present

## 2021-08-20 DIAGNOSIS — I480 Paroxysmal atrial fibrillation: Secondary | ICD-10-CM | POA: Diagnosis not present

## 2021-08-20 DIAGNOSIS — N185 Chronic kidney disease, stage 5: Secondary | ICD-10-CM | POA: Diagnosis not present

## 2021-08-21 DIAGNOSIS — Z789 Other specified health status: Secondary | ICD-10-CM | POA: Diagnosis not present

## 2021-08-21 DIAGNOSIS — Z299 Encounter for prophylactic measures, unspecified: Secondary | ICD-10-CM | POA: Diagnosis not present

## 2021-08-21 DIAGNOSIS — F419 Anxiety disorder, unspecified: Secondary | ICD-10-CM | POA: Diagnosis not present

## 2021-08-21 DIAGNOSIS — Z7189 Other specified counseling: Secondary | ICD-10-CM | POA: Diagnosis not present

## 2021-08-21 DIAGNOSIS — Z1339 Encounter for screening examination for other mental health and behavioral disorders: Secondary | ICD-10-CM | POA: Diagnosis not present

## 2021-08-21 DIAGNOSIS — E7800 Pure hypercholesterolemia, unspecified: Secondary | ICD-10-CM | POA: Diagnosis not present

## 2021-08-21 DIAGNOSIS — I10 Essential (primary) hypertension: Secondary | ICD-10-CM | POA: Diagnosis not present

## 2021-08-21 DIAGNOSIS — Z0000 Encounter for general adult medical examination without abnormal findings: Secondary | ICD-10-CM | POA: Diagnosis not present

## 2021-08-21 DIAGNOSIS — L0231 Cutaneous abscess of buttock: Secondary | ICD-10-CM | POA: Diagnosis not present

## 2021-08-21 DIAGNOSIS — Z1331 Encounter for screening for depression: Secondary | ICD-10-CM | POA: Diagnosis not present

## 2021-08-21 DIAGNOSIS — Z79899 Other long term (current) drug therapy: Secondary | ICD-10-CM | POA: Diagnosis not present

## 2021-08-21 DIAGNOSIS — Z6829 Body mass index (BMI) 29.0-29.9, adult: Secondary | ICD-10-CM | POA: Diagnosis not present

## 2021-08-23 ENCOUNTER — Other Ambulatory Visit: Payer: Self-pay

## 2021-08-23 ENCOUNTER — Encounter (HOSPITAL_COMMUNITY)
Admission: RE | Admit: 2021-08-23 | Discharge: 2021-08-23 | Disposition: A | Discharge status: Home / Self Care | Payer: MEDICARE | Source: Ambulatory Visit | Attending: Nephrology | Admitting: Nephrology

## 2021-08-23 ENCOUNTER — Other Ambulatory Visit (HOSPITAL_COMMUNITY): Admission: RE | Admit: 2021-08-23 | Payer: MEDICARE | Source: Ambulatory Visit | Admitting: Nephrology

## 2021-08-23 ENCOUNTER — Ambulatory Visit (HOSPITAL_COMMUNITY)
Admission: RE | Admit: 2021-08-23 | Discharge: 2021-08-23 | Disposition: A | Discharge status: Home / Self Care | Payer: MEDICARE | Source: Ambulatory Visit | Attending: Nephrology | Admitting: Nephrology

## 2021-08-23 ENCOUNTER — Other Ambulatory Visit (HOSPITAL_COMMUNITY): Payer: Self-pay | Admitting: Nephrology

## 2021-08-23 DIAGNOSIS — I5033 Acute on chronic diastolic (congestive) heart failure: Secondary | ICD-10-CM | POA: Diagnosis not present

## 2021-08-23 DIAGNOSIS — E871 Hypo-osmolality and hyponatremia: Secondary | ICD-10-CM | POA: Diagnosis not present

## 2021-08-23 DIAGNOSIS — I132 Hypertensive heart and chronic kidney disease with heart failure and with stage 5 chronic kidney disease, or end stage renal disease: Secondary | ICD-10-CM | POA: Diagnosis not present

## 2021-08-23 DIAGNOSIS — R0602 Shortness of breath: Secondary | ICD-10-CM

## 2021-08-23 DIAGNOSIS — N185 Chronic kidney disease, stage 5: Secondary | ICD-10-CM | POA: Diagnosis not present

## 2021-08-23 DIAGNOSIS — D631 Anemia in chronic kidney disease: Secondary | ICD-10-CM | POA: Diagnosis not present

## 2021-08-23 DIAGNOSIS — N184 Chronic kidney disease, stage 4 (severe): Secondary | ICD-10-CM | POA: Diagnosis not present

## 2021-08-23 DIAGNOSIS — I480 Paroxysmal atrial fibrillation: Secondary | ICD-10-CM | POA: Diagnosis not present

## 2021-08-23 LAB — URINALYSIS, MICROSCOPIC (REFLEX)
Squamous Epithelial / HPF: NONE SEEN (ref 0–5)
WBC, UA: NONE SEEN WBC/hpf (ref 0–5)

## 2021-08-23 LAB — CBC WITH DIFFERENTIAL/PLATELET
Abs Immature Granulocytes: 0.04 10*3/uL (ref 0.00–0.07)
Basophils Absolute: 0.1 10*3/uL (ref 0.0–0.1)
Basophils Relative: 1 %
Eosinophils Absolute: 0.4 10*3/uL (ref 0.0–0.5)
Eosinophils Relative: 4 %
HCT: 29 % — ABNORMAL LOW (ref 39.0–52.0)
Hemoglobin: 9.5 g/dL — ABNORMAL LOW (ref 13.0–17.0)
Immature Granulocytes: 0 %
Lymphocytes Relative: 7 %
Lymphs Abs: 0.7 10*3/uL (ref 0.7–4.0)
MCH: 31.4 pg (ref 26.0–34.0)
MCHC: 32.8 g/dL (ref 30.0–36.0)
MCV: 95.7 fL (ref 80.0–100.0)
Monocytes Absolute: 0.7 10*3/uL (ref 0.1–1.0)
Monocytes Relative: 7 %
Neutro Abs: 8.3 10*3/uL — ABNORMAL HIGH (ref 1.7–7.7)
Neutrophils Relative %: 81 %
Platelets: 254 10*3/uL (ref 150–400)
RBC: 3.03 MIL/uL — ABNORMAL LOW (ref 4.22–5.81)
RDW: 16.4 % — ABNORMAL HIGH (ref 11.5–15.5)
WBC: 10.1 10*3/uL (ref 4.0–10.5)
nRBC: 0 % (ref 0.0–0.2)

## 2021-08-23 LAB — COMPREHENSIVE METABOLIC PANEL
ALT: 26 U/L (ref 0–44)
AST: 40 U/L (ref 15–41)
Albumin: 3.1 g/dL — ABNORMAL LOW (ref 3.5–5.0)
Alkaline Phosphatase: 66 U/L (ref 38–126)
Anion gap: 12 (ref 5–15)
BUN: 44 mg/dL — ABNORMAL HIGH (ref 8–23)
CO2: 25 mmol/L (ref 22–32)
Calcium: 8.8 mg/dL — ABNORMAL LOW (ref 8.9–10.3)
Chloride: 95 mmol/L — ABNORMAL LOW (ref 98–111)
Creatinine, Ser: 3.89 mg/dL — ABNORMAL HIGH (ref 0.61–1.24)
GFR, Estimated: 15 mL/min — ABNORMAL LOW (ref >60)
Glucose, Bld: 137 mg/dL — ABNORMAL HIGH (ref 70–99)
Potassium: 4 mmol/L (ref 3.5–5.1)
Sodium: 132 mmol/L — ABNORMAL LOW (ref 135–145)
Total Bilirubin: 0.3 mg/dL (ref 0.3–1.2)
Total Protein: 7.2 g/dL (ref 6.5–8.1)

## 2021-08-23 LAB — URINALYSIS, ROUTINE W REFLEX MICROSCOPIC
Bilirubin Urine: NEGATIVE
Glucose, UA: NEGATIVE mg/dL
Ketones, ur: NEGATIVE mg/dL
Leukocytes,Ua: NEGATIVE
Nitrite: NEGATIVE
Protein, ur: 100 mg/dL — AB
Specific Gravity, Urine: 1.02 (ref 1.005–1.030)
pH: 6 (ref 5.0–8.0)

## 2021-08-23 LAB — POCT HEMOGLOBIN-HEMACUE: Hemoglobin: 9.7 g/dL — ABNORMAL LOW (ref 13.0–17.0)

## 2021-08-23 MED ORDER — EPOETIN ALFA 3000 UNIT/ML IJ SOLN
INTRAMUSCULAR | Status: AC
  Filled 2021-08-23: qty 1

## 2021-08-23 MED ORDER — EPOETIN ALFA 3000 UNIT/ML IJ SOLN
3000.0000 [IU] | Freq: Once | INTRAMUSCULAR | Status: AC
  Administered 2021-08-23: 3000 [IU] via SUBCUTANEOUS

## 2021-08-25 DIAGNOSIS — M542 Cervicalgia: Secondary | ICD-10-CM | POA: Diagnosis not present

## 2021-08-25 DIAGNOSIS — R279 Unspecified lack of coordination: Secondary | ICD-10-CM | POA: Diagnosis not present

## 2021-08-25 DIAGNOSIS — I132 Hypertensive heart and chronic kidney disease with heart failure and with stage 5 chronic kidney disease, or end stage renal disease: Secondary | ICD-10-CM | POA: Diagnosis not present

## 2021-08-25 DIAGNOSIS — I5021 Acute systolic (congestive) heart failure: Secondary | ICD-10-CM | POA: Diagnosis not present

## 2021-08-25 DIAGNOSIS — N185 Chronic kidney disease, stage 5: Secondary | ICD-10-CM | POA: Diagnosis not present

## 2021-08-25 DIAGNOSIS — I5031 Acute diastolic (congestive) heart failure: Secondary | ICD-10-CM | POA: Diagnosis not present

## 2021-08-25 DIAGNOSIS — E785 Hyperlipidemia, unspecified: Secondary | ICD-10-CM | POA: Diagnosis not present

## 2021-08-25 DIAGNOSIS — Z9181 History of falling: Secondary | ICD-10-CM | POA: Diagnosis not present

## 2021-08-25 DIAGNOSIS — Z9981 Dependence on supplemental oxygen: Secondary | ICD-10-CM | POA: Diagnosis not present

## 2021-08-25 DIAGNOSIS — D631 Anemia in chronic kidney disease: Secondary | ICD-10-CM | POA: Diagnosis not present

## 2021-08-25 DIAGNOSIS — E871 Hypo-osmolality and hyponatremia: Secondary | ICD-10-CM | POA: Diagnosis not present

## 2021-08-25 DIAGNOSIS — M1990 Unspecified osteoarthritis, unspecified site: Secondary | ICD-10-CM | POA: Diagnosis not present

## 2021-08-25 DIAGNOSIS — I5033 Acute on chronic diastolic (congestive) heart failure: Secondary | ICD-10-CM | POA: Diagnosis not present

## 2021-08-25 DIAGNOSIS — F32A Depression, unspecified: Secondary | ICD-10-CM | POA: Diagnosis not present

## 2021-08-25 DIAGNOSIS — I480 Paroxysmal atrial fibrillation: Secondary | ICD-10-CM | POA: Diagnosis not present

## 2021-08-25 DIAGNOSIS — Z79891 Long term (current) use of opiate analgesic: Secondary | ICD-10-CM | POA: Diagnosis not present

## 2021-08-25 DIAGNOSIS — Z7982 Long term (current) use of aspirin: Secondary | ICD-10-CM | POA: Diagnosis not present

## 2021-08-27 DIAGNOSIS — I5032 Chronic diastolic (congestive) heart failure: Secondary | ICD-10-CM | POA: Diagnosis not present

## 2021-08-27 DIAGNOSIS — E782 Mixed hyperlipidemia: Secondary | ICD-10-CM | POA: Diagnosis not present

## 2021-08-27 DIAGNOSIS — I10 Essential (primary) hypertension: Secondary | ICD-10-CM | POA: Diagnosis not present

## 2021-08-27 DIAGNOSIS — I132 Hypertensive heart and chronic kidney disease with heart failure and with stage 5 chronic kidney disease, or end stage renal disease: Secondary | ICD-10-CM | POA: Diagnosis not present

## 2021-08-27 DIAGNOSIS — N185 Chronic kidney disease, stage 5: Secondary | ICD-10-CM | POA: Diagnosis not present

## 2021-08-27 DIAGNOSIS — I480 Paroxysmal atrial fibrillation: Secondary | ICD-10-CM | POA: Diagnosis not present

## 2021-08-28 DIAGNOSIS — E871 Hypo-osmolality and hyponatremia: Secondary | ICD-10-CM | POA: Diagnosis not present

## 2021-08-28 DIAGNOSIS — D631 Anemia in chronic kidney disease: Secondary | ICD-10-CM | POA: Diagnosis not present

## 2021-08-28 DIAGNOSIS — I480 Paroxysmal atrial fibrillation: Secondary | ICD-10-CM | POA: Diagnosis not present

## 2021-08-28 DIAGNOSIS — I5033 Acute on chronic diastolic (congestive) heart failure: Secondary | ICD-10-CM | POA: Diagnosis not present

## 2021-08-28 DIAGNOSIS — N185 Chronic kidney disease, stage 5: Secondary | ICD-10-CM | POA: Diagnosis not present

## 2021-08-28 DIAGNOSIS — I132 Hypertensive heart and chronic kidney disease with heart failure and with stage 5 chronic kidney disease, or end stage renal disease: Secondary | ICD-10-CM | POA: Diagnosis not present

## 2021-08-30 DIAGNOSIS — I480 Paroxysmal atrial fibrillation: Secondary | ICD-10-CM | POA: Diagnosis not present

## 2021-08-30 DIAGNOSIS — E871 Hypo-osmolality and hyponatremia: Secondary | ICD-10-CM | POA: Diagnosis not present

## 2021-08-30 DIAGNOSIS — I5033 Acute on chronic diastolic (congestive) heart failure: Secondary | ICD-10-CM | POA: Diagnosis not present

## 2021-08-30 DIAGNOSIS — N185 Chronic kidney disease, stage 5: Secondary | ICD-10-CM | POA: Diagnosis not present

## 2021-08-30 DIAGNOSIS — D631 Anemia in chronic kidney disease: Secondary | ICD-10-CM | POA: Diagnosis not present

## 2021-08-30 DIAGNOSIS — I132 Hypertensive heart and chronic kidney disease with heart failure and with stage 5 chronic kidney disease, or end stage renal disease: Secondary | ICD-10-CM | POA: Diagnosis not present

## 2021-08-31 DIAGNOSIS — N185 Chronic kidney disease, stage 5: Secondary | ICD-10-CM | POA: Diagnosis not present

## 2021-08-31 DIAGNOSIS — I480 Paroxysmal atrial fibrillation: Secondary | ICD-10-CM | POA: Diagnosis not present

## 2021-08-31 DIAGNOSIS — E871 Hypo-osmolality and hyponatremia: Secondary | ICD-10-CM | POA: Diagnosis not present

## 2021-08-31 DIAGNOSIS — I132 Hypertensive heart and chronic kidney disease with heart failure and with stage 5 chronic kidney disease, or end stage renal disease: Secondary | ICD-10-CM | POA: Diagnosis not present

## 2021-08-31 DIAGNOSIS — D631 Anemia in chronic kidney disease: Secondary | ICD-10-CM | POA: Diagnosis not present

## 2021-08-31 DIAGNOSIS — I5033 Acute on chronic diastolic (congestive) heart failure: Secondary | ICD-10-CM | POA: Diagnosis not present

## 2021-09-03 ENCOUNTER — Other Ambulatory Visit: Payer: Self-pay

## 2021-09-03 DIAGNOSIS — I5033 Acute on chronic diastolic (congestive) heart failure: Secondary | ICD-10-CM | POA: Diagnosis not present

## 2021-09-03 DIAGNOSIS — N185 Chronic kidney disease, stage 5: Secondary | ICD-10-CM | POA: Diagnosis not present

## 2021-09-03 DIAGNOSIS — E871 Hypo-osmolality and hyponatremia: Secondary | ICD-10-CM | POA: Diagnosis not present

## 2021-09-03 DIAGNOSIS — I132 Hypertensive heart and chronic kidney disease with heart failure and with stage 5 chronic kidney disease, or end stage renal disease: Secondary | ICD-10-CM | POA: Diagnosis not present

## 2021-09-03 DIAGNOSIS — D631 Anemia in chronic kidney disease: Secondary | ICD-10-CM | POA: Diagnosis not present

## 2021-09-03 DIAGNOSIS — I480 Paroxysmal atrial fibrillation: Secondary | ICD-10-CM | POA: Diagnosis not present

## 2021-09-04 DIAGNOSIS — I480 Paroxysmal atrial fibrillation: Secondary | ICD-10-CM | POA: Diagnosis not present

## 2021-09-04 DIAGNOSIS — N185 Chronic kidney disease, stage 5: Secondary | ICD-10-CM | POA: Diagnosis not present

## 2021-09-04 DIAGNOSIS — E871 Hypo-osmolality and hyponatremia: Secondary | ICD-10-CM | POA: Diagnosis not present

## 2021-09-04 DIAGNOSIS — I5033 Acute on chronic diastolic (congestive) heart failure: Secondary | ICD-10-CM | POA: Diagnosis not present

## 2021-09-04 DIAGNOSIS — I132 Hypertensive heart and chronic kidney disease with heart failure and with stage 5 chronic kidney disease, or end stage renal disease: Secondary | ICD-10-CM | POA: Diagnosis not present

## 2021-09-04 DIAGNOSIS — D631 Anemia in chronic kidney disease: Secondary | ICD-10-CM | POA: Diagnosis not present

## 2021-09-05 DIAGNOSIS — I5033 Acute on chronic diastolic (congestive) heart failure: Secondary | ICD-10-CM | POA: Diagnosis not present

## 2021-09-05 DIAGNOSIS — L8990 Pressure ulcer of unspecified site, unspecified stage: Secondary | ICD-10-CM | POA: Diagnosis not present

## 2021-09-05 DIAGNOSIS — I5032 Chronic diastolic (congestive) heart failure: Secondary | ICD-10-CM | POA: Diagnosis not present

## 2021-09-05 DIAGNOSIS — D631 Anemia in chronic kidney disease: Secondary | ICD-10-CM | POA: Diagnosis not present

## 2021-09-05 DIAGNOSIS — N185 Chronic kidney disease, stage 5: Secondary | ICD-10-CM | POA: Diagnosis not present

## 2021-09-05 DIAGNOSIS — I480 Paroxysmal atrial fibrillation: Secondary | ICD-10-CM | POA: Diagnosis not present

## 2021-09-05 DIAGNOSIS — I132 Hypertensive heart and chronic kidney disease with heart failure and with stage 5 chronic kidney disease, or end stage renal disease: Secondary | ICD-10-CM | POA: Diagnosis not present

## 2021-09-05 DIAGNOSIS — E871 Hypo-osmolality and hyponatremia: Secondary | ICD-10-CM | POA: Diagnosis not present

## 2021-09-05 DIAGNOSIS — Z515 Encounter for palliative care: Secondary | ICD-10-CM | POA: Diagnosis not present

## 2021-09-06 ENCOUNTER — Encounter (HOSPITAL_COMMUNITY)
Admission: RE | Admit: 2021-09-06 | Discharge: 2021-09-06 | Disposition: A | Discharge status: Home / Self Care | Payer: MEDICARE | Source: Ambulatory Visit | Attending: Nephrology | Admitting: Nephrology

## 2021-09-06 DIAGNOSIS — N184 Chronic kidney disease, stage 4 (severe): Secondary | ICD-10-CM | POA: Diagnosis not present

## 2021-09-06 LAB — POCT HEMOGLOBIN-HEMACUE: Hemoglobin: 9.2 g/dL — ABNORMAL LOW (ref 13.0–17.0)

## 2021-09-06 MED ORDER — EPOETIN ALFA 3000 UNIT/ML IJ SOLN
3000.0000 [IU] | Freq: Once | INTRAMUSCULAR | Status: AC
  Administered 2021-09-06: 3000 [IU] via SUBCUTANEOUS

## 2021-09-06 MED ORDER — EPOETIN ALFA 3000 UNIT/ML IJ SOLN
INTRAMUSCULAR | Status: AC
  Filled 2021-09-06: qty 1

## 2021-09-07 DIAGNOSIS — I5033 Acute on chronic diastolic (congestive) heart failure: Secondary | ICD-10-CM | POA: Diagnosis not present

## 2021-09-07 DIAGNOSIS — E871 Hypo-osmolality and hyponatremia: Secondary | ICD-10-CM | POA: Diagnosis not present

## 2021-09-07 DIAGNOSIS — D631 Anemia in chronic kidney disease: Secondary | ICD-10-CM | POA: Diagnosis not present

## 2021-09-07 DIAGNOSIS — I480 Paroxysmal atrial fibrillation: Secondary | ICD-10-CM | POA: Diagnosis not present

## 2021-09-07 DIAGNOSIS — N185 Chronic kidney disease, stage 5: Secondary | ICD-10-CM | POA: Diagnosis not present

## 2021-09-07 DIAGNOSIS — I132 Hypertensive heart and chronic kidney disease with heart failure and with stage 5 chronic kidney disease, or end stage renal disease: Secondary | ICD-10-CM | POA: Diagnosis not present

## 2021-09-10 DIAGNOSIS — E871 Hypo-osmolality and hyponatremia: Secondary | ICD-10-CM | POA: Diagnosis not present

## 2021-09-10 DIAGNOSIS — I5033 Acute on chronic diastolic (congestive) heart failure: Secondary | ICD-10-CM | POA: Diagnosis not present

## 2021-09-10 DIAGNOSIS — I480 Paroxysmal atrial fibrillation: Secondary | ICD-10-CM | POA: Diagnosis not present

## 2021-09-10 DIAGNOSIS — D631 Anemia in chronic kidney disease: Secondary | ICD-10-CM | POA: Diagnosis not present

## 2021-09-10 DIAGNOSIS — I132 Hypertensive heart and chronic kidney disease with heart failure and with stage 5 chronic kidney disease, or end stage renal disease: Secondary | ICD-10-CM | POA: Diagnosis not present

## 2021-09-10 DIAGNOSIS — N185 Chronic kidney disease, stage 5: Secondary | ICD-10-CM | POA: Diagnosis not present

## 2021-09-11 DIAGNOSIS — D631 Anemia in chronic kidney disease: Secondary | ICD-10-CM | POA: Diagnosis not present

## 2021-09-11 DIAGNOSIS — N185 Chronic kidney disease, stage 5: Secondary | ICD-10-CM | POA: Diagnosis not present

## 2021-09-11 DIAGNOSIS — I5033 Acute on chronic diastolic (congestive) heart failure: Secondary | ICD-10-CM | POA: Diagnosis not present

## 2021-09-11 DIAGNOSIS — E871 Hypo-osmolality and hyponatremia: Secondary | ICD-10-CM | POA: Diagnosis not present

## 2021-09-11 DIAGNOSIS — I480 Paroxysmal atrial fibrillation: Secondary | ICD-10-CM | POA: Diagnosis not present

## 2021-09-11 DIAGNOSIS — I132 Hypertensive heart and chronic kidney disease with heart failure and with stage 5 chronic kidney disease, or end stage renal disease: Secondary | ICD-10-CM | POA: Diagnosis not present

## 2021-09-13 DIAGNOSIS — I132 Hypertensive heart and chronic kidney disease with heart failure and with stage 5 chronic kidney disease, or end stage renal disease: Secondary | ICD-10-CM | POA: Diagnosis not present

## 2021-09-13 DIAGNOSIS — D631 Anemia in chronic kidney disease: Secondary | ICD-10-CM | POA: Diagnosis not present

## 2021-09-13 DIAGNOSIS — I480 Paroxysmal atrial fibrillation: Secondary | ICD-10-CM | POA: Diagnosis not present

## 2021-09-13 DIAGNOSIS — E871 Hypo-osmolality and hyponatremia: Secondary | ICD-10-CM | POA: Diagnosis not present

## 2021-09-13 DIAGNOSIS — I5033 Acute on chronic diastolic (congestive) heart failure: Secondary | ICD-10-CM | POA: Diagnosis not present

## 2021-09-13 DIAGNOSIS — N185 Chronic kidney disease, stage 5: Secondary | ICD-10-CM | POA: Diagnosis not present

## 2021-09-17 DIAGNOSIS — I480 Paroxysmal atrial fibrillation: Secondary | ICD-10-CM | POA: Diagnosis not present

## 2021-09-17 DIAGNOSIS — I132 Hypertensive heart and chronic kidney disease with heart failure and with stage 5 chronic kidney disease, or end stage renal disease: Secondary | ICD-10-CM | POA: Diagnosis not present

## 2021-09-17 DIAGNOSIS — I5033 Acute on chronic diastolic (congestive) heart failure: Secondary | ICD-10-CM | POA: Diagnosis not present

## 2021-09-17 DIAGNOSIS — D631 Anemia in chronic kidney disease: Secondary | ICD-10-CM | POA: Diagnosis not present

## 2021-09-17 DIAGNOSIS — N185 Chronic kidney disease, stage 5: Secondary | ICD-10-CM | POA: Diagnosis not present

## 2021-09-17 DIAGNOSIS — E871 Hypo-osmolality and hyponatremia: Secondary | ICD-10-CM | POA: Diagnosis not present

## 2021-09-18 DIAGNOSIS — Z23 Encounter for immunization: Secondary | ICD-10-CM | POA: Diagnosis not present

## 2021-09-20 ENCOUNTER — Encounter (HOSPITAL_COMMUNITY)
Admission: RE | Admit: 2021-09-20 | Discharge: 2021-09-20 | Disposition: A | Discharge status: Home / Self Care | Payer: MEDICARE | Source: Ambulatory Visit | Attending: Nephrology | Admitting: Nephrology

## 2021-09-20 ENCOUNTER — Other Ambulatory Visit: Payer: Self-pay

## 2021-09-20 DIAGNOSIS — N184 Chronic kidney disease, stage 4 (severe): Secondary | ICD-10-CM | POA: Diagnosis not present

## 2021-09-20 LAB — POCT HEMOGLOBIN-HEMACUE: Hemoglobin: 8 g/dL — ABNORMAL LOW (ref 13.0–17.0)

## 2021-09-20 MED ORDER — EPOETIN ALFA 3000 UNIT/ML IJ SOLN
INTRAMUSCULAR | Status: AC
  Filled 2021-09-20: qty 1

## 2021-09-20 MED ORDER — EPOETIN ALFA 3000 UNIT/ML IJ SOLN
3000.0000 [IU] | Freq: Once | INTRAMUSCULAR | Status: AC
  Administered 2021-09-20: 3000 [IU] via SUBCUTANEOUS

## 2021-09-22 DIAGNOSIS — I132 Hypertensive heart and chronic kidney disease with heart failure and with stage 5 chronic kidney disease, or end stage renal disease: Secondary | ICD-10-CM | POA: Diagnosis not present

## 2021-09-22 DIAGNOSIS — D631 Anemia in chronic kidney disease: Secondary | ICD-10-CM | POA: Diagnosis not present

## 2021-09-22 DIAGNOSIS — I480 Paroxysmal atrial fibrillation: Secondary | ICD-10-CM | POA: Diagnosis not present

## 2021-09-22 DIAGNOSIS — E871 Hypo-osmolality and hyponatremia: Secondary | ICD-10-CM | POA: Diagnosis not present

## 2021-09-22 DIAGNOSIS — I5033 Acute on chronic diastolic (congestive) heart failure: Secondary | ICD-10-CM | POA: Diagnosis not present

## 2021-09-22 DIAGNOSIS — N185 Chronic kidney disease, stage 5: Secondary | ICD-10-CM | POA: Diagnosis not present

## 2021-09-29 ENCOUNTER — Encounter: Payer: Self-pay | Admitting: Gastroenterology

## 2021-09-29 NOTE — Progress Notes (Deleted)
Referring Provider: Dr. Theador Hawthorne Primary Care Physician:  Glenda Chroman, MD Primary Gastroenterologist:  Dr. Abbey Chatters   No chief complaint on file.   HPI:   [name] [name] is a 87 y.o. male presenting today at the request of Dr. Theador Hawthorne, (patient's nephrologist) for anemia.   Per Chart Review:  Started IV iron in August due to low iron and low saturation ratios.  Last Ferritin 272 in April 2022. Has received 4 doses of Feraheme with last dose 08/15/2021.  He is also receiving Epogen. Hemoglobin previously in the 9-10 range, declined to 8 range around June 2022.  Declined as low as 7.1 on 8/17 during hospitalization at [facility] with acute on chronic diastolic CHF, HCAP.  He received 1 unit PRBCs with hemoglobin 8.2 at time of discharge.   Hgb improved to 9.8 on 9/7.   Most recent hemoglobin 8.0 on 10/20. Most recent iron panel 08/08/2021 with iron 273, saturation 118%.  Today:     Past Medical History:  Diagnosis Date   Anemia    Anxiety    Artificial opening care, other    pt states that he has artifical sphincter, unable to have foley cath placed.    Cancer ([place])    prostate ca 1999/removal/ rad tx   Carotid artery occlusion    Chronic diastolic heart failure (HCC)    CKD (chronic kidney disease)    Depression    DVT (deep venous thrombosis) (HCC)    Gout    History of kidney stones    Hyperlipidemia    Hypertension    Hyponatremia    Incontinence    Reflux    Renal disorder    Status post implantation of artificial urinary sphincter    Wears dentures    Wears glasses     Past Surgical History:  Procedure Laterality Date   AV FISTULA PLACEMENT Left 05/15/2020   Procedure: LEFT FOREARM  ARTERIOVENOUS (AV) GRAFT INSERTION;  Surgeon: Angelia Mould, MD;  Location: [facility];  Service: Vascular;  Laterality: Left;   CARDIAC CATHETERIZATION     2000-2001   cataracts     CORONARY STENT PLACEMENT     HERNIA REPAIR     MULTIPLE TOOTH EXTRACTIONS      NEPHROSTOMY     radical prostectomy     URINARY SPHINCTER IMPLANT      Current Outpatient Medications  Medication Sig Dispense Refill   acetaminophen (TYLENOL) 500 MG tablet Take 1,000 mg by mouth every 8 (eight) hours as needed for mild pain or moderate pain. For headache pain     albuterol (VENTOLIN HFA) 108 (90 Base) MCG/ACT inhaler Inhale into the lungs every 6 (six) hours as needed for wheezing or shortness of breath.     allopurinol (ZYLOPRIM) 100 MG tablet Take 100 mg by mouth daily.   0   ALPRAZolam (XANAX) 0.5 MG tablet Take 1 tablet (0.5 mg total) by mouth 2 (two) times daily as needed for anxiety. 15 tablet 0   amLODipine (NORVASC) 10 MG tablet Take 10 mg by mouth daily.     ARIPiprazole (ABILIFY) 20 MG tablet Take 1 tablet (20 mg total) by mouth daily. 30 tablet 3   carvedilol (COREG) 6.25 MG tablet Take 6.25 mg by mouth 2 (two) times daily.     cefdinir (OMNICEF) 300 MG capsule Take 1 capsule (300 mg total) by mouth daily. 4 capsule 0   epoetin alfa (EPOGEN) 3000 UNIT/ML injection Inject 3,000 Units into the skin every  14 (fourteen) days.      finasteride (PROSCAR) 5 MG tablet Take 1 tablet (5 mg total) by mouth daily. 90 tablet 3   fish oil-omega-3 fatty acids 1000 MG capsule Take 1 g by mouth daily.     furosemide (LASIX) 40 MG tablet Take 40 mg by mouth daily as needed.     lansoprazole (PREVACID) 15 MG capsule Take 1 capsule (15 mg total) by mouth 2 (two) times daily before a meal. 60 capsule 4   Multiple Vitamin (MULTIVITAMIN WITH MINERALS) TABS tablet Take 1 tablet by mouth daily.     ofloxacin (FLOXIN) 0.3 % OTIC solution Place 5 drops into both ears daily.  2   Probiotic Product (RISA-BID PROBIOTIC) TABS Give 1 tablet by mouth twice a day from 09/14/2018-09/24/2018     rosuvastatin (CRESTOR) 10 MG tablet Take 10 mg by mouth daily.     sertraline (ZOLOFT) 100 MG tablet Take 1 tablet (100 mg total) by mouth daily. 30 tablet 3   sodium bicarbonate 650 MG tablet Take 1  tablet (650 mg total) by mouth 3 (three) times daily. 90 tablet 1   tamsulosin (FLOMAX) 0.4 MG CAPS capsule Take 1 capsule (0.4 mg total) by mouth daily after supper. 30 capsule 11   traMADol (ULTRAM) 50 MG tablet Take 1 tablet (50 mg total) by mouth every 6 (six) hours as needed. 15 tablet 0   No current facility-administered medications for this visit.    Allergies as of 10/01/2021 - Review Complete 09/20/2021  Allergen Reaction Noted   Ivp dye [iodinated diagnostic agents]  11/29/2011   Nalbuphine Other (See Comments)    Codeine Anxiety     Family History  Problem Relation Age of Onset   Coronary artery disease Other        family history, male < 56   Arthritis Other        family history   Cancer Other    Kidney disease Other     Social History   Socioeconomic History   Marital status: Widowed    Spouse name: Not on file   Number of children: 2   Years of education: Not on file   Highest education level: Not on file  Occupational History   Occupation: retired  Tobacco Use   Smoking status: Former    Packs/day: 2.00    Types: Cigarettes    Start date: 08/05/1962    Quit date: 12/02/1972    Years since quitting: 48.8   Smokeless tobacco: Never  Vaping Use   Vaping Use: Never used  Substance and Sexual Activity   Alcohol use: No   Drug use: No   Sexual activity: Not on file  Other Topics Concern   Not on file  Social History Narrative   Not on file   Social Determinants of Health   Financial Resource Strain: Not on file  Food Insecurity: Not on file  Transportation Needs: Not on file  Physical Activity: Not on file  Stress: Not on file  Social Connections: Not on file  Intimate Partner Violence: Not on file    Review of Systems: Gen: Denies any fever, chills, fatigue, weight loss, lack of appetite.  CV: Denies chest pain, heart palpitations, peripheral edema, syncope.  Resp: Denies shortness of breath at rest or with exertion. Denies wheezing or cough.   GI: Denies dysphagia or odynophagia. Denies jaundice, hematemesis, fecal incontinence. GU : Denies urinary burning, urinary frequency, urinary hesitancy MS: Denies joint pain, muscle weakness, cramps,  or limitation of movement.  Derm: Denies rash, itching, dry skin Psych: Denies depression, anxiety, memory loss, and confusion Heme: Denies bruising, bleeding, and enlarged lymph nodes.  Physical Exam: There were no vitals taken for this visit. General:   Alert and oriented. Pleasant and cooperative. Well-nourished and well-developed.  Head:  Normocephalic and atraumatic. Eyes:  Without icterus, sclera clear and conjunctiva pink.  Ears:  Normal auditory acuity. Nose:  No deformity, discharge,  or lesions. Mouth:  No deformity or lesions, oral mucosa pink.  Neck:  Supple, without mass or thyromegaly. Lungs:  Clear to auscultation bilaterally. No wheezes, rales, or rhonchi. No distress.  Heart:  S1, S2 present without murmurs appreciated.  Abdomen:  +BS, soft, non-tender and non-distended. No HSM noted. No guarding or rebound. No masses appreciated.  Rectal:  Deferred  Msk:  Symmetrical without gross deformities. Normal posture. Pulses:  Normal pulses noted. Extremities:  Without clubbing or edema. Neurologic:  Alert and  oriented x4;  grossly normal neurologically. Skin:  Intact without significant lesions or rashes. Cervical Nodes:  No significant cervical adenopathy. Psych:  Alert and cooperative. Normal mood and affect.

## 2021-10-01 ENCOUNTER — Ambulatory Visit: Payer: No Typology Code available for payment source | Admitting: Gastroenterology

## 2021-10-01 ENCOUNTER — Encounter: Payer: Self-pay | Admitting: Internal Medicine

## 2021-10-01 DIAGNOSIS — M5459 Other low back pain: Secondary | ICD-10-CM | POA: Diagnosis not present

## 2021-10-01 DIAGNOSIS — Z79899 Other long term (current) drug therapy: Secondary | ICD-10-CM | POA: Diagnosis not present

## 2021-10-01 DIAGNOSIS — N189 Chronic kidney disease, unspecified: Secondary | ICD-10-CM | POA: Diagnosis not present

## 2021-10-01 DIAGNOSIS — R809 Proteinuria, unspecified: Secondary | ICD-10-CM | POA: Diagnosis not present

## 2021-10-01 DIAGNOSIS — D631 Anemia in chronic kidney disease: Secondary | ICD-10-CM | POA: Diagnosis not present

## 2021-10-01 DIAGNOSIS — N184 Chronic kidney disease, stage 4 (severe): Secondary | ICD-10-CM | POA: Diagnosis not present

## 2021-10-03 DIAGNOSIS — Z515 Encounter for palliative care: Secondary | ICD-10-CM | POA: Diagnosis not present

## 2021-10-03 DIAGNOSIS — I5032 Chronic diastolic (congestive) heart failure: Secondary | ICD-10-CM | POA: Diagnosis not present

## 2021-10-03 DIAGNOSIS — N185 Chronic kidney disease, stage 5: Secondary | ICD-10-CM | POA: Diagnosis not present

## 2021-10-04 ENCOUNTER — Encounter (HOSPITAL_COMMUNITY)
Admission: RE | Admit: 2021-10-04 | Discharge: 2021-10-04 | Disposition: A | Discharge status: Home / Self Care | Payer: MEDICARE | Source: Ambulatory Visit | Attending: Nephrology | Admitting: Nephrology

## 2021-10-04 DIAGNOSIS — N184 Chronic kidney disease, stage 4 (severe): Secondary | ICD-10-CM | POA: Diagnosis not present

## 2021-10-04 DIAGNOSIS — D631 Anemia in chronic kidney disease: Secondary | ICD-10-CM | POA: Insufficient documentation

## 2021-10-04 LAB — POCT HEMOGLOBIN-HEMACUE: Hemoglobin: 9.4 g/dL — ABNORMAL LOW (ref 13.0–17.0)

## 2021-10-04 MED ORDER — EPOETIN ALFA 3000 UNIT/ML IJ SOLN
INTRAMUSCULAR | Status: AC
  Filled 2021-10-04: qty 1

## 2021-10-04 MED ORDER — EPOETIN ALFA 3000 UNIT/ML IJ SOLN
3000.0000 [IU] | Freq: Once | INTRAMUSCULAR | Status: AC
  Administered 2021-10-04: 3000 [IU] via SUBCUTANEOUS

## 2021-10-04 NOTE — Progress Notes (Signed)
Pt refuses to have monthly labs drawn. Stated that he had them drawn on Monday 10/01/21 at [facility].

## 2021-10-05 DIAGNOSIS — N189 Chronic kidney disease, unspecified: Secondary | ICD-10-CM | POA: Diagnosis not present

## 2021-10-05 DIAGNOSIS — D631 Anemia in chronic kidney disease: Secondary | ICD-10-CM | POA: Diagnosis not present

## 2021-10-05 DIAGNOSIS — N185 Chronic kidney disease, stage 5: Secondary | ICD-10-CM | POA: Diagnosis not present

## 2021-10-05 DIAGNOSIS — E211 Secondary hyperparathyroidism, not elsewhere classified: Secondary | ICD-10-CM | POA: Diagnosis not present

## 2021-10-05 DIAGNOSIS — R809 Proteinuria, unspecified: Secondary | ICD-10-CM | POA: Diagnosis not present

## 2021-10-05 DIAGNOSIS — I129 Hypertensive chronic kidney disease with stage 1 through stage 4 chronic kidney disease, or unspecified chronic kidney disease: Secondary | ICD-10-CM | POA: Diagnosis not present

## 2021-10-18 ENCOUNTER — Encounter (HOSPITAL_COMMUNITY)
Admission: RE | Admit: 2021-10-18 | Discharge: 2021-10-18 | Disposition: A | Discharge status: Home / Self Care | Payer: MEDICARE | Source: Ambulatory Visit | Attending: Nephrology | Admitting: Nephrology

## 2021-10-18 ENCOUNTER — Other Ambulatory Visit: Payer: Self-pay

## 2021-10-18 ENCOUNTER — Encounter (HOSPITAL_COMMUNITY): Payer: Self-pay

## 2021-10-18 DIAGNOSIS — N184 Chronic kidney disease, stage 4 (severe): Secondary | ICD-10-CM | POA: Diagnosis not present

## 2021-10-18 LAB — POCT HEMOGLOBIN-HEMACUE: Hemoglobin: 10.4 g/dL — ABNORMAL LOW (ref 13.0–17.0)

## 2021-10-29 DIAGNOSIS — I480 Paroxysmal atrial fibrillation: Secondary | ICD-10-CM | POA: Diagnosis not present

## 2021-10-29 DIAGNOSIS — I5033 Acute on chronic diastolic (congestive) heart failure: Secondary | ICD-10-CM | POA: Diagnosis not present

## 2021-10-29 DIAGNOSIS — N185 Chronic kidney disease, stage 5: Secondary | ICD-10-CM | POA: Diagnosis not present

## 2021-10-29 DIAGNOSIS — I132 Hypertensive heart and chronic kidney disease with heart failure and with stage 5 chronic kidney disease, or end stage renal disease: Secondary | ICD-10-CM | POA: Diagnosis not present

## 2021-10-31 DIAGNOSIS — Z515 Encounter for palliative care: Secondary | ICD-10-CM | POA: Diagnosis not present

## 2021-10-31 DIAGNOSIS — I5032 Chronic diastolic (congestive) heart failure: Secondary | ICD-10-CM | POA: Diagnosis not present

## 2021-10-31 DIAGNOSIS — N185 Chronic kidney disease, stage 5: Secondary | ICD-10-CM | POA: Diagnosis not present

## 2021-11-01 ENCOUNTER — Other Ambulatory Visit: Payer: Self-pay

## 2021-11-01 ENCOUNTER — Encounter (HOSPITAL_COMMUNITY)
Admission: RE | Admit: 2021-11-01 | Discharge: 2021-11-01 | Disposition: A | Discharge status: Home / Self Care | Payer: MEDICARE | Source: Ambulatory Visit | Attending: Nephrology | Admitting: Nephrology

## 2021-11-01 DIAGNOSIS — N184 Chronic kidney disease, stage 4 (severe): Secondary | ICD-10-CM | POA: Insufficient documentation

## 2021-11-01 DIAGNOSIS — D631 Anemia in chronic kidney disease: Secondary | ICD-10-CM | POA: Diagnosis not present

## 2021-11-01 LAB — POCT HEMOGLOBIN-HEMACUE: Hemoglobin: 10.3 g/dL — ABNORMAL LOW (ref 13.0–17.0)

## 2021-11-01 MED ORDER — EPOETIN ALFA 3000 UNIT/ML IJ SOLN: 3000.0000 [IU] | Freq: Once | INTRAMUSCULAR | Status: DC

## 2021-11-09 DIAGNOSIS — R809 Proteinuria, unspecified: Secondary | ICD-10-CM | POA: Diagnosis not present

## 2021-11-09 DIAGNOSIS — E211 Secondary hyperparathyroidism, not elsewhere classified: Secondary | ICD-10-CM | POA: Diagnosis not present

## 2021-11-09 DIAGNOSIS — I129 Hypertensive chronic kidney disease with stage 1 through stage 4 chronic kidney disease, or unspecified chronic kidney disease: Secondary | ICD-10-CM | POA: Diagnosis not present

## 2021-11-09 DIAGNOSIS — N189 Chronic kidney disease, unspecified: Secondary | ICD-10-CM | POA: Diagnosis not present

## 2021-11-09 DIAGNOSIS — D631 Anemia in chronic kidney disease: Secondary | ICD-10-CM | POA: Diagnosis not present

## 2021-11-09 DIAGNOSIS — N185 Chronic kidney disease, stage 5: Secondary | ICD-10-CM | POA: Diagnosis not present

## 2021-11-15 ENCOUNTER — Other Ambulatory Visit: Payer: Self-pay

## 2021-11-15 ENCOUNTER — Encounter (HOSPITAL_COMMUNITY): Payer: Self-pay

## 2021-11-15 ENCOUNTER — Encounter (HOSPITAL_COMMUNITY)
Admission: RE | Admit: 2021-11-15 | Discharge: 2021-11-15 | Disposition: A | Discharge status: Home / Self Care | Payer: MEDICARE | Source: Ambulatory Visit | Attending: Nephrology | Admitting: Nephrology

## 2021-11-15 DIAGNOSIS — N184 Chronic kidney disease, stage 4 (severe): Secondary | ICD-10-CM | POA: Diagnosis not present

## 2021-11-15 LAB — POCT HEMOGLOBIN-HEMACUE: Hemoglobin: 9.7 g/dL — ABNORMAL LOW (ref 13.0–17.0)

## 2021-11-15 MED ORDER — EPOETIN ALFA 3000 UNIT/ML IJ SOLN
INTRAMUSCULAR | Status: AC
  Filled 2021-11-15: qty 1

## 2021-11-15 MED ORDER — EPOETIN ALFA 3000 UNIT/ML IJ SOLN
3000.0000 [IU] | Freq: Once | INTRAMUSCULAR | Status: AC
  Administered 2021-11-15: 3000 [IU] via SUBCUTANEOUS

## 2021-11-16 DIAGNOSIS — R809 Proteinuria, unspecified: Secondary | ICD-10-CM | POA: Diagnosis not present

## 2021-11-16 DIAGNOSIS — I129 Hypertensive chronic kidney disease with stage 1 through stage 4 chronic kidney disease, or unspecified chronic kidney disease: Secondary | ICD-10-CM | POA: Diagnosis not present

## 2021-11-16 DIAGNOSIS — N185 Chronic kidney disease, stage 5: Secondary | ICD-10-CM | POA: Diagnosis not present

## 2021-11-16 DIAGNOSIS — D631 Anemia in chronic kidney disease: Secondary | ICD-10-CM | POA: Diagnosis not present

## 2021-11-16 DIAGNOSIS — N189 Chronic kidney disease, unspecified: Secondary | ICD-10-CM | POA: Diagnosis not present

## 2021-11-16 DIAGNOSIS — E211 Secondary hyperparathyroidism, not elsewhere classified: Secondary | ICD-10-CM | POA: Diagnosis not present

## 2021-11-29 ENCOUNTER — Encounter (HOSPITAL_COMMUNITY)
Admission: RE | Admit: 2021-11-29 | Discharge: 2021-11-29 | Disposition: A | Discharge status: Home / Self Care | Payer: MEDICARE | Source: Ambulatory Visit | Attending: Nephrology | Admitting: Nephrology

## 2021-11-29 ENCOUNTER — Encounter (HOSPITAL_COMMUNITY): Payer: Self-pay

## 2021-11-29 DIAGNOSIS — N184 Chronic kidney disease, stage 4 (severe): Secondary | ICD-10-CM | POA: Diagnosis not present

## 2021-11-29 LAB — CBC WITH DIFFERENTIAL/PLATELET
Abs Immature Granulocytes: 0.03 10*3/uL (ref 0.00–0.07)
Basophils Absolute: 0 10*3/uL (ref 0.0–0.1)
Basophils Relative: 1 %
Eosinophils Absolute: 0.5 10*3/uL (ref 0.0–0.5)
Eosinophils Relative: 8 %
HCT: 28.5 % — ABNORMAL LOW (ref 39.0–52.0)
Hemoglobin: 9.1 g/dL — ABNORMAL LOW (ref 13.0–17.0)
Immature Granulocytes: 1 %
Lymphocytes Relative: 14 %
Lymphs Abs: 0.9 10*3/uL (ref 0.7–4.0)
MCH: 32.9 pg (ref 26.0–34.0)
MCHC: 31.9 g/dL (ref 30.0–36.0)
MCV: 102.9 fL — ABNORMAL HIGH (ref 80.0–100.0)
Monocytes Absolute: 0.5 10*3/uL (ref 0.1–1.0)
Monocytes Relative: 7 %
Neutro Abs: 4.6 10*3/uL (ref 1.7–7.7)
Neutrophils Relative %: 69 %
Platelets: 142 10*3/uL — ABNORMAL LOW (ref 150–400)
RBC: 2.77 MIL/uL — ABNORMAL LOW (ref 4.22–5.81)
RDW: 13.6 % (ref 11.5–15.5)
WBC: 6.6 10*3/uL (ref 4.0–10.5)
nRBC: 0 % (ref 0.0–0.2)

## 2021-11-29 LAB — RENAL FUNCTION PANEL
Albumin: 3.6 g/dL (ref 3.5–5.0)
Anion gap: 13 (ref 5–15)
BUN: 55 mg/dL — ABNORMAL HIGH (ref 8–23)
CO2: 24 mmol/L (ref 22–32)
Calcium: 9 mg/dL (ref 8.9–10.3)
Chloride: 99 mmol/L (ref 98–111)
Creatinine, Ser: 4.39 mg/dL — ABNORMAL HIGH (ref 0.61–1.24)
GFR, Estimated: 12 mL/min — ABNORMAL LOW (ref >60)
Glucose, Bld: 108 mg/dL — ABNORMAL HIGH (ref 70–99)
Phosphorus: 4 mg/dL (ref 2.5–4.6)
Potassium: 4 mmol/L (ref 3.5–5.1)
Sodium: 136 mmol/L (ref 135–145)

## 2021-11-29 LAB — IRON AND TIBC
Iron: 63 ug/dL (ref 45–182)
Saturation Ratios: 26 % (ref 17.9–39.5)
TIBC: 239 ug/dL — ABNORMAL LOW (ref 250–450)
UIBC: 176 ug/dL

## 2021-11-29 LAB — POCT HEMOGLOBIN-HEMACUE: Hemoglobin: 9 g/dL — ABNORMAL LOW (ref 13.0–17.0)

## 2021-11-29 MED ORDER — EPOETIN ALFA 3000 UNIT/ML IJ SOLN
3000.0000 [IU] | Freq: Once | INTRAMUSCULAR | Status: AC
Start: 2021-11-29 — End: 2021-11-29
  Administered 2021-11-29: 13:00:00 3000 [IU] via SUBCUTANEOUS

## 2021-11-29 MED ORDER — EPOETIN ALFA 3000 UNIT/ML IJ SOLN
INTRAMUSCULAR | Status: AC
  Filled 2021-11-29: qty 1

## 2021-11-30 DIAGNOSIS — F419 Anxiety disorder, unspecified: Secondary | ICD-10-CM | POA: Diagnosis not present

## 2021-11-30 DIAGNOSIS — C61 Malignant neoplasm of prostate: Secondary | ICD-10-CM | POA: Diagnosis not present

## 2021-11-30 DIAGNOSIS — I10 Essential (primary) hypertension: Secondary | ICD-10-CM | POA: Diagnosis not present

## 2021-11-30 DIAGNOSIS — Z299 Encounter for prophylactic measures, unspecified: Secondary | ICD-10-CM | POA: Diagnosis not present

## 2021-11-30 DIAGNOSIS — E7800 Pure hypercholesterolemia, unspecified: Secondary | ICD-10-CM | POA: Diagnosis not present

## 2021-11-30 LAB — PTH, INTACT AND CALCIUM
Calcium, Total (PTH): 8.9 mg/dL (ref 8.6–10.2)
PTH: 45 pg/mL (ref 15–65)

## 2021-12-02 DIAGNOSIS — H9203 Otalgia, bilateral: Secondary | ICD-10-CM | POA: Diagnosis not present

## 2021-12-02 DIAGNOSIS — Z0000 Encounter for general adult medical examination without abnormal findings: Secondary | ICD-10-CM | POA: Diagnosis not present

## 2021-12-12 DIAGNOSIS — Z515 Encounter for palliative care: Secondary | ICD-10-CM | POA: Diagnosis not present

## 2021-12-12 DIAGNOSIS — N185 Chronic kidney disease, stage 5: Secondary | ICD-10-CM | POA: Diagnosis not present

## 2021-12-12 DIAGNOSIS — I5032 Chronic diastolic (congestive) heart failure: Secondary | ICD-10-CM | POA: Diagnosis not present

## 2021-12-13 ENCOUNTER — Encounter (HOSPITAL_COMMUNITY)
Admission: RE | Admit: 2021-12-13 | Discharge: 2021-12-13 | Disposition: A | Discharge status: Home / Self Care | Payer: MEDICARE | Source: Ambulatory Visit | Attending: Nephrology | Admitting: Nephrology

## 2021-12-13 DIAGNOSIS — N184 Chronic kidney disease, stage 4 (severe): Secondary | ICD-10-CM | POA: Diagnosis not present

## 2021-12-13 LAB — POCT HEMOGLOBIN-HEMACUE: Hemoglobin: 8.7 g/dL — ABNORMAL LOW (ref 13.0–17.0)

## 2021-12-13 MED ORDER — EPOETIN ALFA 3000 UNIT/ML IJ SOLN
INTRAMUSCULAR | Status: AC
  Filled 2021-12-13: qty 1

## 2021-12-13 MED ORDER — EPOETIN ALFA 3000 UNIT/ML IJ SOLN
3000.0000 [IU] | Freq: Once | INTRAMUSCULAR | Status: AC
  Administered 2021-12-13: 3000 [IU] via SUBCUTANEOUS

## 2021-12-20 DIAGNOSIS — R809 Proteinuria, unspecified: Secondary | ICD-10-CM | POA: Diagnosis not present

## 2021-12-20 DIAGNOSIS — N189 Chronic kidney disease, unspecified: Secondary | ICD-10-CM | POA: Diagnosis not present

## 2021-12-20 DIAGNOSIS — I129 Hypertensive chronic kidney disease with stage 1 through stage 4 chronic kidney disease, or unspecified chronic kidney disease: Secondary | ICD-10-CM | POA: Diagnosis not present

## 2021-12-20 DIAGNOSIS — N185 Chronic kidney disease, stage 5: Secondary | ICD-10-CM | POA: Diagnosis not present

## 2021-12-20 DIAGNOSIS — E211 Secondary hyperparathyroidism, not elsewhere classified: Secondary | ICD-10-CM | POA: Diagnosis not present

## 2021-12-20 DIAGNOSIS — D631 Anemia in chronic kidney disease: Secondary | ICD-10-CM | POA: Diagnosis not present

## 2021-12-21 DIAGNOSIS — I5033 Acute on chronic diastolic (congestive) heart failure: Secondary | ICD-10-CM | POA: Diagnosis not present

## 2021-12-21 DIAGNOSIS — D649 Anemia, unspecified: Secondary | ICD-10-CM | POA: Diagnosis not present

## 2021-12-21 DIAGNOSIS — E785 Hyperlipidemia, unspecified: Secondary | ICD-10-CM | POA: Diagnosis not present

## 2021-12-21 DIAGNOSIS — I132 Hypertensive heart and chronic kidney disease with heart failure and with stage 5 chronic kidney disease, or end stage renal disease: Secondary | ICD-10-CM | POA: Diagnosis not present

## 2021-12-21 DIAGNOSIS — N400 Enlarged prostate without lower urinary tract symptoms: Secondary | ICD-10-CM | POA: Diagnosis not present

## 2021-12-21 DIAGNOSIS — F418 Other specified anxiety disorders: Secondary | ICD-10-CM | POA: Diagnosis not present

## 2021-12-21 DIAGNOSIS — I10 Essential (primary) hypertension: Secondary | ICD-10-CM | POA: Diagnosis not present

## 2021-12-21 DIAGNOSIS — M109 Gout, unspecified: Secondary | ICD-10-CM | POA: Diagnosis not present

## 2021-12-21 DIAGNOSIS — N185 Chronic kidney disease, stage 5: Secondary | ICD-10-CM | POA: Diagnosis not present

## 2021-12-21 DIAGNOSIS — Z0189 Encounter for other specified special examinations: Secondary | ICD-10-CM | POA: Diagnosis not present

## 2021-12-21 DIAGNOSIS — I480 Paroxysmal atrial fibrillation: Secondary | ICD-10-CM | POA: Diagnosis not present

## 2021-12-21 DIAGNOSIS — I509 Heart failure, unspecified: Secondary | ICD-10-CM | POA: Diagnosis not present

## 2021-12-21 DIAGNOSIS — G894 Chronic pain syndrome: Secondary | ICD-10-CM | POA: Diagnosis not present

## 2021-12-25 DIAGNOSIS — R040 Epistaxis: Secondary | ICD-10-CM | POA: Diagnosis not present

## 2021-12-25 DIAGNOSIS — H9201 Otalgia, right ear: Secondary | ICD-10-CM | POA: Diagnosis not present

## 2021-12-25 DIAGNOSIS — M542 Cervicalgia: Secondary | ICD-10-CM | POA: Diagnosis not present

## 2021-12-25 DIAGNOSIS — J019 Acute sinusitis, unspecified: Secondary | ICD-10-CM | POA: Diagnosis not present

## 2021-12-27 ENCOUNTER — Other Ambulatory Visit: Payer: Self-pay

## 2021-12-27 ENCOUNTER — Encounter (HOSPITAL_COMMUNITY): Payer: Self-pay

## 2021-12-27 ENCOUNTER — Encounter (HOSPITAL_COMMUNITY)
Admission: RE | Admit: 2021-12-27 | Discharge: 2021-12-27 | Disposition: A | Discharge status: Home / Self Care | Payer: MEDICARE | Source: Ambulatory Visit | Attending: Nephrology | Admitting: Nephrology

## 2021-12-27 ENCOUNTER — Emergency Department (HOSPITAL_COMMUNITY): Payer: MEDICARE

## 2021-12-27 ENCOUNTER — Emergency Department (HOSPITAL_COMMUNITY)
Admission: EM | Admit: 2021-12-27 | Discharge: 2021-12-27 | Disposition: A | Discharge status: Home / Self Care | Payer: MEDICARE | Attending: Emergency Medicine | Admitting: Emergency Medicine

## 2021-12-27 DIAGNOSIS — R059 Cough, unspecified: Secondary | ICD-10-CM | POA: Diagnosis not present

## 2021-12-27 DIAGNOSIS — N189 Chronic kidney disease, unspecified: Secondary | ICD-10-CM | POA: Diagnosis not present

## 2021-12-27 DIAGNOSIS — I480 Paroxysmal atrial fibrillation: Secondary | ICD-10-CM | POA: Diagnosis not present

## 2021-12-27 DIAGNOSIS — N186 End stage renal disease: Secondary | ICD-10-CM | POA: Insufficient documentation

## 2021-12-27 DIAGNOSIS — Z79899 Other long term (current) drug therapy: Secondary | ICD-10-CM | POA: Insufficient documentation

## 2021-12-27 DIAGNOSIS — I120 Hypertensive chronic kidney disease with stage 5 chronic kidney disease or end stage renal disease: Secondary | ICD-10-CM | POA: Diagnosis not present

## 2021-12-27 DIAGNOSIS — J90 Pleural effusion, not elsewhere classified: Secondary | ICD-10-CM | POA: Diagnosis not present

## 2021-12-27 DIAGNOSIS — X58XXXA Exposure to other specified factors, initial encounter: Secondary | ICD-10-CM | POA: Diagnosis not present

## 2021-12-27 DIAGNOSIS — S29001A Unspecified injury of muscle and tendon of front wall of thorax, initial encounter: Secondary | ICD-10-CM | POA: Diagnosis present

## 2021-12-27 DIAGNOSIS — S20211A Contusion of right front wall of thorax, initial encounter: Secondary | ICD-10-CM | POA: Insufficient documentation

## 2021-12-27 DIAGNOSIS — Z992 Dependence on renal dialysis: Secondary | ICD-10-CM | POA: Diagnosis not present

## 2021-12-27 DIAGNOSIS — S0511XA Contusion of eyeball and orbital tissues, right eye, initial encounter: Secondary | ICD-10-CM | POA: Insufficient documentation

## 2021-12-27 LAB — CBC WITH DIFFERENTIAL/PLATELET
Abs Immature Granulocytes: 0.03 10*3/uL (ref 0.00–0.07)
Basophils Absolute: 0 10*3/uL (ref 0.0–0.1)
Basophils Relative: 0 %
Eosinophils Absolute: 0.4 10*3/uL (ref 0.0–0.5)
Eosinophils Relative: 5 %
HCT: 27.4 % — ABNORMAL LOW (ref 39.0–52.0)
Hemoglobin: 8.8 g/dL — ABNORMAL LOW (ref 13.0–17.0)
Immature Granulocytes: 0 %
Lymphocytes Relative: 8 %
Lymphs Abs: 0.6 10*3/uL — ABNORMAL LOW (ref 0.7–4.0)
MCH: 33.5 pg (ref 26.0–34.0)
MCHC: 32.1 g/dL (ref 30.0–36.0)
MCV: 104.2 fL — ABNORMAL HIGH (ref 80.0–100.0)
Monocytes Absolute: 0.6 10*3/uL (ref 0.1–1.0)
Monocytes Relative: 8 %
Neutro Abs: 6 10*3/uL (ref 1.7–7.7)
Neutrophils Relative %: 79 %
Platelets: 139 10*3/uL — ABNORMAL LOW (ref 150–400)
RBC: 2.63 MIL/uL — ABNORMAL LOW (ref 4.22–5.81)
RDW: 13.8 % (ref 11.5–15.5)
WBC: 7.7 10*3/uL (ref 4.0–10.5)
nRBC: 0 % (ref 0.0–0.2)

## 2021-12-27 LAB — HEPATITIS PANEL, ACUTE
HCV Ab: NONREACTIVE
Hep A IgM: NONREACTIVE
Hep B C IgM: NONREACTIVE
Hepatitis B Surface Ag: NONREACTIVE

## 2021-12-27 LAB — PROTIME-INR
INR: 1.2 (ref 0.8–1.2)
Prothrombin Time: 14.9 seconds (ref 11.4–15.2)

## 2021-12-27 LAB — BASIC METABOLIC PANEL
Anion gap: 10 (ref 5–15)
BUN: 60 mg/dL — ABNORMAL HIGH (ref 8–23)
CO2: 22 mmol/L (ref 22–32)
Calcium: 8.7 mg/dL — ABNORMAL LOW (ref 8.9–10.3)
Chloride: 100 mmol/L (ref 98–111)
Creatinine, Ser: 5.02 mg/dL — ABNORMAL HIGH (ref 0.61–1.24)
GFR, Estimated: 11 mL/min — ABNORMAL LOW (ref >60)
Glucose, Bld: 141 mg/dL — ABNORMAL HIGH (ref 70–99)
Potassium: 4.2 mmol/L (ref 3.5–5.1)
Sodium: 132 mmol/L — ABNORMAL LOW (ref 135–145)

## 2021-12-27 LAB — HEPATITIS B SURFACE ANTIGEN: Hepatitis B Surface Ag: NONREACTIVE

## 2021-12-27 LAB — BRAIN NATRIURETIC PEPTIDE: B Natriuretic Peptide: 949 pg/mL — ABNORMAL HIGH (ref 0.0–100.0)

## 2021-12-27 NOTE — ED Triage Notes (Signed)
Patient with chronic renal failure has had 7 pound weight gain in one week. Family concerned about fluid retention.

## 2021-12-27 NOTE — ED Provider Notes (Signed)
[facility] EMERGENCY DEPARTMENT Provider Note  CSN: [id number] Arrival date & time: 12/27/21 1020  History Chief Complaint  Patient presents with   Weight Gain    [name] [name] is a 88 y.o. 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 He has had a dry cough but no CP, SOB, N/V/D or dysuria. He has been urinating normally. No leg swelling. He has had some itching and has had spontaneous bruising recently which is new. He is not on a blood thinner. He gets EPO injections every 2 weeks, due today but came to the ED for evaluation first.   Home Medications Prior to Admission medications   Medication Sig Start Date End Date Taking? Authorizing Provider  acetaminophen (TYLENOL) 500 MG tablet Take 1,000 mg by mouth every 8 (eight) hours as needed for mild pain or moderate pain. For headache pain   Yes [provider]  albuterol (VENTOLIN HFA) 108 (90 Base) MCG/ACT inhaler Inhale into the lungs every 6 (six) hours as needed for wheezing or shortness of breath.   Yes [provider]  allopurinol (ZYLOPRIM) 100 MG tablet Take 100 mg by mouth daily.  10/07/18  Yes [provider]  ALPRAZolam Duanne Moron) 0.5 MG tablet Take 1 tablet (0.5 mg total) by mouth 2 (two) times daily as needed for anxiety. 06/06/20  Yes Emokpae, Courage, MD  amLODipine (NORVASC) 10 MG tablet Take 10 mg by mouth daily. 03/27/20  Yes [provider]  amoxicillin-clavulanate (AUGMENTIN) 875-125 MG tablet Take 1 tablet by mouth in the morning and at bedtime.   Yes [provider]  ARIPiprazole (ABILIFY) 20 MG tablet Take 1 tablet (20 mg total) by mouth daily. 06/06/20  Yes Emokpae, Courage, MD  carvedilol (COREG) 6.25 MG tablet Take 6.25 mg by mouth 2 (two) times daily. 05/02/21  Yes [provider]  epoetin alfa  (EPOGEN) 3000 UNIT/ML injection Inject 3,000 Units into the skin every 14 (fourteen) days.    Yes [provider]  finasteride (PROSCAR) 5 MG tablet Take 1 tablet (5 mg total) by mouth daily. 06/01/21  Yes McKenzie, Candee Furbish, MD  fish oil-omega-3 fatty acids 1000 MG capsule Take 1 g by mouth daily.   Yes [provider]  furosemide (LASIX) 40 MG tablet Take 40 mg by mouth 2 (two) times daily. 03/27/20  Yes [provider]  lansoprazole (PREVACID) 15 MG capsule Take 1 capsule (15 mg total) by mouth 2 (two) times daily before a meal. 06/06/20  Yes Emokpae, Courage, MD  Multiple Vitamin (MULTIVITAMIN WITH MINERALS) TABS tablet Take 1 tablet by mouth daily.   Yes [provider]  ofloxacin (FLOXIN) 0.3 % OTIC solution Place 5 drops into both ears daily. 10/19/18  Yes [provider]  rosuvastatin (CRESTOR) 10 MG tablet Take 10 mg by mouth daily.   Yes [provider]  sertraline (ZOLOFT) 100 MG tablet Take 1 tablet (100 mg total) by mouth daily. 06/06/20  Yes Emokpae, Courage, MD  sodium bicarbonate 650 MG tablet Take 1 tablet (650 mg total) by mouth 3 (three) times daily. 06/06/20  Yes Emokpae, Courage, MD  cefdinir (OMNICEF) 300 MG capsule Take 1 capsule (300 mg total) by mouth daily. Patient not taking: Reported on 12/27/2021 05/22/21   Orson Eva, MD  tamsulosin (FLOMAX) 0.4 MG CAPS capsule Take 1 capsule (0.4 mg total) by mouth daily after supper. Patient not taking: Reported on 12/27/2021 06/01/21   Cleon Gustin, MD  traMADol (ULTRAM) 50 MG tablet Take 1 tablet (50 mg total) by mouth every 6 (six) hours as needed. Patient not taking: Reported on 12/27/2021 05/15/20   Angelia Mould, MD     Allergies    Ivp dye [iodinated contrast media], Nalbuphine, and Codeine   Review of Systems   Review of Systems Please see HPI for pertinent positives and negatives  Physical Exam BP 113/64    Pulse 81    Temp 97.8 F (36.6 C)    Resp 13    Ht 5'  8" (1.727 m)    Wt 78 kg    SpO2 92%    BMI 26.15 kg/m   Physical Exam Vitals and nursing note reviewed.  Constitutional:      Appearance: Normal appearance.  HENT:     Head: Normocephalic and atraumatic.     Nose: Nose normal.     Mouth/Throat:     Mouth: Mucous membranes are moist.  Eyes:     Extraocular Movements: Extraocular movements intact.     Conjunctiva/sclera: Conjunctivae normal.  Cardiovascular:     Rate and Rhythm: Normal rate. Rhythm irregular.  Pulmonary:     Effort: Pulmonary effort is normal.     Breath sounds: Normal breath sounds.  Abdominal:     General: Abdomen is flat.     Palpations: Abdomen is soft.     Tenderness: There is no abdominal tenderness.  Musculoskeletal:        General: No swelling. Normal range of motion.     Cervical back: Neck supple.     Comments: LUE AV graft with palpable thrill  Skin:    General: Skin is warm and dry.     Findings: Bruising (R periorbital, L chest wall) present.  Neurological:     General: No focal deficit present.     Mental Status: He is alert.  Psychiatric:        Mood and Affect: Mood normal.    ED Results / Procedures / Treatments   EKG EKG Interpretation  Date/Time:  Thursday December 27 2021 11:04:04 EST Ventricular Rate:  102 PR Interval:    QRS Duration: 111 QT Interval:  381 QTC Calculation: 497 R Axis:   70 Text Interpretation: Duplicate Confirmed by Calvert Cantor [phone]) on 12/27/2021 11:33:13 AM  Procedures Procedures  Medications Ordered in the ED Medications - No data to display  Initial Impression and Plan  Patient with known CKD here with weight gain, itching and unexplained bruising, no falls or injuries. He has a mild dry cough but is otherwise at his baseline. Will check labs and reassess. Afib on monitor. Has had similar on old EKGs, but denies taking a blood thinner.  ED Course   Clinical Course as of 12/27/21 1413  Thu Dec 27, 2021  1132 CBC with anemia, similar to  previous.  [CS]  6045 INR is normal.  [CS]  1158 BMP shows continued worsening from prior baseline. BNP is mildly elevated.  [CS]  4098 CXR with cardiomegaly and mild effusions, no overt edema.  [CS]  1406 Spoke with Dr. Theador Hawthorne, Nephrologist who cares for the patient. He agrees patient will need to begin dialysis in the next few days, but does not emergent dialysis today. He will order additional labs needed to get him set up for  dialysis. He will see the patient in his office tomorrow. Patient and daughter at bedside aware. He was encouraged to return to the ED sooner if he develops SOB, nausea, vomiting, confusion or weakness as this may be a sign he needs dialysis sooner.  [CS]  1409 Patient also noted to be in afib, has been told to take ASA for same was not put on anticoagulation by Cards in the past despite elevated CHADsVASC score of 5. Daughter reports he was told his afib was due to his kidney disease. Rate is controlled now. Advised continued outpatient follow up to discuss anticoagulation.  [CS]    Clinical Course User Index [CS] Truddie Hidden, MD     MDM Rules/Calculators/A&P Medical Decision Making Problems Addressed: ESRD (end stage renal disease) [facility]): chronic illness or injury with exacerbation, progression, or side effects of treatment Paroxysmal atrial fibrillation [facility]): chronic illness or injury  Amount and/or Complexity of Data Reviewed Labs: ordered. Decision-making details documented in ED Course. Radiology: ordered and independent interpretation performed. Decision-making details documented in ED Course. ECG/medicine tests: ordered and independent interpretation performed. Decision-making details documented in ED Course.  Risk Decision regarding hospitalization.    Final Clinical Impression(s) / ED Diagnoses Final diagnoses:  ESRD (end stage renal disease) ([place])  Paroxysmal atrial fibrillation [facility])    Rx / DC Orders ED Discharge Orders     None         Truddie Hidden, MD 12/27/21 1413

## 2021-12-28 DIAGNOSIS — N19 Unspecified kidney failure: Secondary | ICD-10-CM | POA: Diagnosis not present

## 2021-12-28 DIAGNOSIS — R809 Proteinuria, unspecified: Secondary | ICD-10-CM | POA: Diagnosis not present

## 2021-12-28 DIAGNOSIS — R059 Cough, unspecified: Secondary | ICD-10-CM | POA: Diagnosis not present

## 2021-12-28 DIAGNOSIS — R053 Chronic cough: Secondary | ICD-10-CM | POA: Diagnosis not present

## 2021-12-28 DIAGNOSIS — E8722 Chronic metabolic acidosis: Secondary | ICD-10-CM | POA: Diagnosis not present

## 2021-12-28 DIAGNOSIS — N185 Chronic kidney disease, stage 5: Secondary | ICD-10-CM | POA: Diagnosis not present

## 2021-12-28 DIAGNOSIS — R600 Localized edema: Secondary | ICD-10-CM | POA: Diagnosis not present

## 2021-12-28 DIAGNOSIS — E870 Hyperosmolality and hypernatremia: Secondary | ICD-10-CM | POA: Diagnosis not present

## 2021-12-28 DIAGNOSIS — I129 Hypertensive chronic kidney disease with stage 1 through stage 4 chronic kidney disease, or unspecified chronic kidney disease: Secondary | ICD-10-CM | POA: Diagnosis not present

## 2021-12-28 DIAGNOSIS — D631 Anemia in chronic kidney disease: Secondary | ICD-10-CM | POA: Diagnosis not present

## 2021-12-28 DIAGNOSIS — N189 Chronic kidney disease, unspecified: Secondary | ICD-10-CM | POA: Diagnosis not present

## 2021-12-28 LAB — HCV RNA QUANT RFLX ULTRA OR GENOTYP
HCV RNA Qnt(log copy/mL): UNDETERMINED log10 IU/mL
HepC Qn: NOT DETECTED IU/mL

## 2021-12-28 LAB — HEPATITIS B SURFACE ANTIBODY, QUANTITATIVE: Hep B S AB Quant (Post): <3.1 m[IU]/mL — ABNORMAL LOW (ref >9.9)

## 2022-01-01 DIAGNOSIS — Z992 Dependence on renal dialysis: Secondary | ICD-10-CM | POA: Diagnosis not present

## 2022-01-01 DIAGNOSIS — D631 Anemia in chronic kidney disease: Secondary | ICD-10-CM | POA: Diagnosis not present

## 2022-01-01 DIAGNOSIS — N2581 Secondary hyperparathyroidism of renal origin: Secondary | ICD-10-CM | POA: Diagnosis not present

## 2022-01-01 DIAGNOSIS — N186 End stage renal disease: Secondary | ICD-10-CM | POA: Diagnosis not present

## 2022-01-01 LAB — QUANTIFERON-TB GOLD PLUS: QuantiFERON-TB Gold Plus: NEGATIVE

## 2022-01-01 LAB — QUANTIFERON-TB GOLD PLUS (RQFGPL)
QuantiFERON Mitogen Value: 2.17 IU/mL
QuantiFERON Nil Value: 0 IU/mL
QuantiFERON TB1 Ag Value: 0 IU/mL
QuantiFERON TB2 Ag Value: 0.01 IU/mL

## 2022-01-02 ENCOUNTER — Other Ambulatory Visit: Payer: Medicare Other

## 2022-01-03 DIAGNOSIS — D631 Anemia in chronic kidney disease: Secondary | ICD-10-CM | POA: Diagnosis not present

## 2022-01-03 DIAGNOSIS — N2581 Secondary hyperparathyroidism of renal origin: Secondary | ICD-10-CM | POA: Diagnosis not present

## 2022-01-03 DIAGNOSIS — N186 End stage renal disease: Secondary | ICD-10-CM | POA: Diagnosis not present

## 2022-01-03 DIAGNOSIS — Z992 Dependence on renal dialysis: Secondary | ICD-10-CM | POA: Diagnosis not present

## 2022-01-05 DIAGNOSIS — D631 Anemia in chronic kidney disease: Secondary | ICD-10-CM | POA: Diagnosis not present

## 2022-01-05 DIAGNOSIS — Z992 Dependence on renal dialysis: Secondary | ICD-10-CM | POA: Diagnosis not present

## 2022-01-05 DIAGNOSIS — N186 End stage renal disease: Secondary | ICD-10-CM | POA: Diagnosis not present

## 2022-01-05 DIAGNOSIS — N2581 Secondary hyperparathyroidism of renal origin: Secondary | ICD-10-CM | POA: Diagnosis not present

## 2022-01-07 ENCOUNTER — Inpatient Hospital Stay (HOSPITAL_COMMUNITY): Payer: MEDICARE

## 2022-01-07 ENCOUNTER — Encounter (HOSPITAL_COMMUNITY): Payer: Self-pay | Admitting: *Deleted

## 2022-01-07 ENCOUNTER — Emergency Department (HOSPITAL_COMMUNITY): Payer: MEDICARE

## 2022-01-07 ENCOUNTER — Other Ambulatory Visit: Payer: Self-pay

## 2022-01-07 ENCOUNTER — Inpatient Hospital Stay (HOSPITAL_COMMUNITY)
Admission: EM | Admit: 2022-01-07 | Discharge: 2022-01-09 | DRG: 640 | Disposition: A | Discharge status: Home Health | Payer: MEDICARE | Attending: Family Medicine | Admitting: Family Medicine

## 2022-01-07 DIAGNOSIS — Z8546 Personal history of malignant neoplasm of prostate: Secondary | ICD-10-CM

## 2022-01-07 DIAGNOSIS — D509 Iron deficiency anemia, unspecified: Secondary | ICD-10-CM | POA: Diagnosis not present

## 2022-01-07 DIAGNOSIS — F32A Depression, unspecified: Secondary | ICD-10-CM | POA: Diagnosis not present

## 2022-01-07 DIAGNOSIS — R0600 Dyspnea, unspecified: Secondary | ICD-10-CM

## 2022-01-07 DIAGNOSIS — L89152 Pressure ulcer of sacral region, stage 2: Secondary | ICD-10-CM | POA: Diagnosis present

## 2022-01-07 DIAGNOSIS — Z91041 Radiographic dye allergy status: Secondary | ICD-10-CM

## 2022-01-07 DIAGNOSIS — Z20822 Contact with and (suspected) exposure to covid-19: Secondary | ICD-10-CM | POA: Diagnosis present

## 2022-01-07 DIAGNOSIS — Z7189 Other specified counseling: Secondary | ICD-10-CM | POA: Diagnosis not present

## 2022-01-07 DIAGNOSIS — N186 End stage renal disease: Secondary | ICD-10-CM | POA: Diagnosis present

## 2022-01-07 DIAGNOSIS — R0602 Shortness of breath: Secondary | ICD-10-CM | POA: Diagnosis present

## 2022-01-07 DIAGNOSIS — D649 Anemia, unspecified: Secondary | ICD-10-CM | POA: Diagnosis present

## 2022-01-07 DIAGNOSIS — D631 Anemia in chronic kidney disease: Secondary | ICD-10-CM | POA: Diagnosis not present

## 2022-01-07 DIAGNOSIS — E8779 Other fluid overload: Principal | ICD-10-CM | POA: Diagnosis present

## 2022-01-07 DIAGNOSIS — E785 Hyperlipidemia, unspecified: Secondary | ICD-10-CM | POA: Diagnosis present

## 2022-01-07 DIAGNOSIS — Z888 Allergy status to other drugs, medicaments and biological substances status: Secondary | ICD-10-CM

## 2022-01-07 DIAGNOSIS — Z8249 Family history of ischemic heart disease and other diseases of the circulatory system: Secondary | ICD-10-CM

## 2022-01-07 DIAGNOSIS — N189 Chronic kidney disease, unspecified: Secondary | ICD-10-CM | POA: Diagnosis present

## 2022-01-07 DIAGNOSIS — F419 Anxiety disorder, unspecified: Secondary | ICD-10-CM | POA: Diagnosis present

## 2022-01-07 DIAGNOSIS — K219 Gastro-esophageal reflux disease without esophagitis: Secondary | ICD-10-CM | POA: Diagnosis present

## 2022-01-07 DIAGNOSIS — J90 Pleural effusion, not elsewhere classified: Secondary | ICD-10-CM | POA: Diagnosis not present

## 2022-01-07 DIAGNOSIS — Z87891 Personal history of nicotine dependence: Secondary | ICD-10-CM

## 2022-01-07 DIAGNOSIS — Z86718 Personal history of other venous thrombosis and embolism: Secondary | ICD-10-CM

## 2022-01-07 DIAGNOSIS — Z515 Encounter for palliative care: Secondary | ICD-10-CM | POA: Diagnosis not present

## 2022-01-07 DIAGNOSIS — L8990 Pressure ulcer of unspecified site, unspecified stage: Secondary | ICD-10-CM | POA: Diagnosis present

## 2022-01-07 DIAGNOSIS — I480 Paroxysmal atrial fibrillation: Secondary | ICD-10-CM | POA: Diagnosis present

## 2022-01-07 DIAGNOSIS — I517 Cardiomegaly: Secondary | ICD-10-CM | POA: Diagnosis not present

## 2022-01-07 DIAGNOSIS — Z885 Allergy status to narcotic agent status: Secondary | ICD-10-CM | POA: Diagnosis not present

## 2022-01-07 DIAGNOSIS — Z992 Dependence on renal dialysis: Secondary | ICD-10-CM

## 2022-01-07 DIAGNOSIS — R319 Hematuria, unspecified: Secondary | ICD-10-CM | POA: Diagnosis present

## 2022-01-07 DIAGNOSIS — R079 Chest pain, unspecified: Secondary | ICD-10-CM | POA: Diagnosis not present

## 2022-01-07 DIAGNOSIS — Z79899 Other long term (current) drug therapy: Secondary | ICD-10-CM

## 2022-01-07 DIAGNOSIS — M109 Gout, unspecified: Secondary | ICD-10-CM | POA: Diagnosis present

## 2022-01-07 DIAGNOSIS — I5022 Chronic systolic (congestive) heart failure: Secondary | ICD-10-CM | POA: Diagnosis present

## 2022-01-07 DIAGNOSIS — I5032 Chronic diastolic (congestive) heart failure: Secondary | ICD-10-CM | POA: Diagnosis present

## 2022-01-07 DIAGNOSIS — N250 Renal osteodystrophy: Secondary | ICD-10-CM | POA: Diagnosis not present

## 2022-01-07 DIAGNOSIS — Z79891 Long term (current) use of opiate analgesic: Secondary | ICD-10-CM

## 2022-01-07 DIAGNOSIS — Z66 Do not resuscitate: Secondary | ICD-10-CM | POA: Diagnosis not present

## 2022-01-07 DIAGNOSIS — I10 Essential (primary) hypertension: Secondary | ICD-10-CM | POA: Diagnosis present

## 2022-01-07 DIAGNOSIS — I132 Hypertensive heart and chronic kidney disease with heart failure and with stage 5 chronic kidney disease, or end stage renal disease: Secondary | ICD-10-CM | POA: Diagnosis present

## 2022-01-07 DIAGNOSIS — Z9079 Acquired absence of other genital organ(s): Secondary | ICD-10-CM

## 2022-01-07 DIAGNOSIS — I509 Heart failure, unspecified: Secondary | ICD-10-CM | POA: Diagnosis not present

## 2022-01-07 DIAGNOSIS — Z905 Acquired absence of kidney: Secondary | ICD-10-CM

## 2022-01-07 DIAGNOSIS — I2510 Atherosclerotic heart disease of native coronary artery without angina pectoris: Secondary | ICD-10-CM | POA: Diagnosis present

## 2022-01-07 DIAGNOSIS — Z960 Presence of urogenital implants: Secondary | ICD-10-CM | POA: Diagnosis present

## 2022-01-07 DIAGNOSIS — E871 Hypo-osmolality and hyponatremia: Secondary | ICD-10-CM | POA: Diagnosis present

## 2022-01-07 DIAGNOSIS — Z955 Presence of coronary angioplasty implant and graft: Secondary | ICD-10-CM

## 2022-01-07 LAB — TROPONIN I (HIGH SENSITIVITY)
Troponin I (High Sensitivity): 8 ng/L (ref <18)
Troponin I (High Sensitivity): 9 ng/L (ref <18)

## 2022-01-07 LAB — CBC WITH DIFFERENTIAL/PLATELET
Abs Immature Granulocytes: 0.05 10*3/uL (ref 0.00–0.07)
Basophils Absolute: 0 10*3/uL (ref 0.0–0.1)
Basophils Relative: 0 %
Eosinophils Absolute: 0 10*3/uL (ref 0.0–0.5)
Eosinophils Relative: 0 %
HCT: 22.8 % — ABNORMAL LOW (ref 39.0–52.0)
Hemoglobin: 7 g/dL — ABNORMAL LOW (ref 13.0–17.0)
Immature Granulocytes: 1 %
Lymphocytes Relative: 6 %
Lymphs Abs: 0.6 10*3/uL — ABNORMAL LOW (ref 0.7–4.0)
MCH: 32.3 pg (ref 26.0–34.0)
MCHC: 30.7 g/dL (ref 30.0–36.0)
MCV: 105.1 fL — ABNORMAL HIGH (ref 80.0–100.0)
Monocytes Absolute: 0.6 10*3/uL (ref 0.1–1.0)
Monocytes Relative: 7 %
Neutro Abs: 8.4 10*3/uL — ABNORMAL HIGH (ref 1.7–7.7)
Neutrophils Relative %: 86 %
Platelets: 191 10*3/uL (ref 150–400)
RBC: 2.17 MIL/uL — ABNORMAL LOW (ref 4.22–5.81)
RDW: 14.1 % (ref 11.5–15.5)
WBC: 9.8 10*3/uL (ref 4.0–10.5)
nRBC: 0 % (ref 0.0–0.2)

## 2022-01-07 LAB — COMPREHENSIVE METABOLIC PANEL
ALT: 15 U/L (ref 0–44)
AST: 22 U/L (ref 15–41)
Albumin: 3 g/dL — ABNORMAL LOW (ref 3.5–5.0)
Alkaline Phosphatase: 70 U/L (ref 38–126)
Anion gap: 13 (ref 5–15)
BUN: 36 mg/dL — ABNORMAL HIGH (ref 8–23)
CO2: 29 mmol/L (ref 22–32)
Calcium: 8.5 mg/dL — ABNORMAL LOW (ref 8.9–10.3)
Chloride: 92 mmol/L — ABNORMAL LOW (ref 98–111)
Creatinine, Ser: 4.58 mg/dL — ABNORMAL HIGH (ref 0.61–1.24)
GFR, Estimated: 12 mL/min — ABNORMAL LOW (ref >60)
Glucose, Bld: 135 mg/dL — ABNORMAL HIGH (ref 70–99)
Potassium: 3.4 mmol/L — ABNORMAL LOW (ref 3.5–5.1)
Sodium: 134 mmol/L — ABNORMAL LOW (ref 135–145)
Total Bilirubin: 0.3 mg/dL (ref 0.3–1.2)
Total Protein: 7.4 g/dL (ref 6.5–8.1)

## 2022-01-07 LAB — IRON AND TIBC
Iron: 28 ug/dL — ABNORMAL LOW (ref 45–182)
Saturation Ratios: 13 % — ABNORMAL LOW (ref 17.9–39.5)
TIBC: 209 ug/dL — ABNORMAL LOW (ref 250–450)
UIBC: 181 ug/dL

## 2022-01-07 LAB — PREPARE RBC (CROSSMATCH)

## 2022-01-07 LAB — BRAIN NATRIURETIC PEPTIDE
B Natriuretic Peptide: 1222 pg/mL — ABNORMAL HIGH (ref 0.0–100.0)
B Natriuretic Peptide: 1045 pg/mL — ABNORMAL HIGH (ref 0.0–100.0)

## 2022-01-07 LAB — HEMOGLOBIN AND HEMATOCRIT, BLOOD
HCT: 23.6 % — ABNORMAL LOW (ref 39.0–52.0)
Hemoglobin: 7.4 g/dL — ABNORMAL LOW (ref 13.0–17.0)

## 2022-01-07 LAB — RESP PANEL BY RT-PCR (FLU A&B, COVID) ARPGX2
Influenza A by PCR: NEGATIVE
Influenza B by PCR: NEGATIVE
SARS Coronavirus 2 by RT PCR: NEGATIVE

## 2022-01-07 LAB — D-DIMER, QUANTITATIVE: D-Dimer, Quant: 3.78 ug/mL-FEU — ABNORMAL HIGH (ref 0.00–0.50)

## 2022-01-07 LAB — VITAMIN B12: Vitamin B-12: 930 pg/mL — ABNORMAL HIGH (ref 180–914)

## 2022-01-07 MED ORDER — SODIUM CHLORIDE 0.9% FLUSH: 3.0000 mL | INTRAVENOUS | Status: DC | PRN

## 2022-01-07 MED ORDER — ONDANSETRON HCL 4 MG PO TABS: 4.0000 mg | ORAL_TABLET | Freq: Four times a day (QID) | ORAL | Status: DC | PRN

## 2022-01-07 MED ORDER — ACETAMINOPHEN 650 MG RE SUPP: 650.0000 mg | Freq: Four times a day (QID) | RECTAL | Status: DC | PRN

## 2022-01-07 MED ORDER — TRAZODONE HCL 50 MG PO TABS
25.0000 mg | ORAL_TABLET | Freq: Every evening | ORAL | Status: DC | PRN
  Administered 2022-01-07 – 2022-01-08 (×2): 25 mg via ORAL
  Filled 2022-01-07 (×2): qty 1

## 2022-01-07 MED ORDER — SENNOSIDES-DOCUSATE SODIUM 8.6-50 MG PO TABS: 1.0000 | ORAL_TABLET | Freq: Every evening | ORAL | Status: DC | PRN

## 2022-01-07 MED ORDER — DIPHENHYDRAMINE HCL 25 MG PO CAPS
25.0000 mg | ORAL_CAPSULE | Freq: Once | ORAL | Status: AC
  Administered 2022-01-07: 25 mg via ORAL
  Filled 2022-01-07: qty 1

## 2022-01-07 MED ORDER — SODIUM CHLORIDE 0.9% FLUSH
3.0000 mL | Freq: Two times a day (BID) | INTRAVENOUS | Status: DC
  Administered 2022-01-08 – 2022-01-09 (×4): 3 mL via INTRAVENOUS

## 2022-01-07 MED ORDER — ONDANSETRON HCL 4 MG/2ML IJ SOLN: 4.0000 mg | Freq: Four times a day (QID) | INTRAMUSCULAR | Status: DC | PRN

## 2022-01-07 MED ORDER — OXYCODONE HCL 5 MG PO TABS: 5.0000 mg | ORAL_TABLET | ORAL | Status: DC | PRN

## 2022-01-07 MED ORDER — METOPROLOL TARTRATE 50 MG PO TABS
100.0000 mg | ORAL_TABLET | Freq: Two times a day (BID) | ORAL | Status: DC
  Administered 2022-01-07 – 2022-01-09 (×3): 100 mg via ORAL
  Filled 2022-01-07 (×4): qty 2

## 2022-01-07 MED ORDER — FUROSEMIDE 10 MG/ML IJ SOLN: 40.0000 mg | Freq: Once | INTRAMUSCULAR | Status: DC

## 2022-01-07 MED ORDER — SODIUM CHLORIDE 0.9 % IV SOLN
250.0000 mg | Freq: Every day | INTRAVENOUS | Status: DC
Start: 2022-01-07 — End: 2022-01-09
  Administered 2022-01-08 – 2022-01-09 (×3): 250 mg via INTRAVENOUS
  Filled 2022-01-07 (×4): qty 20

## 2022-01-07 MED ORDER — APIXABAN 2.5 MG PO TABS
2.5000 mg | ORAL_TABLET | Freq: Two times a day (BID) | ORAL | Status: DC
  Administered 2022-01-07 – 2022-01-09 (×4): 2.5 mg via ORAL
  Filled 2022-01-07 (×4): qty 1

## 2022-01-07 MED ORDER — HYDRALAZINE HCL 20 MG/ML IJ SOLN: 10.0000 mg | INTRAMUSCULAR | Status: DC | PRN

## 2022-01-07 MED ORDER — HYDROMORPHONE HCL 1 MG/ML IJ SOLN: 0.5000 mg | INTRAMUSCULAR | Status: DC | PRN

## 2022-01-07 MED ORDER — LEVALBUTEROL HCL 0.63 MG/3ML IN NEBU: 0.6300 mg | INHALATION_SOLUTION | Freq: Four times a day (QID) | RESPIRATORY_TRACT | Status: DC | PRN

## 2022-01-07 MED ORDER — IPRATROPIUM BROMIDE 0.02 % IN SOLN: 0.5000 mg | Freq: Four times a day (QID) | RESPIRATORY_TRACT | Status: DC | PRN

## 2022-01-07 MED ORDER — CHLORHEXIDINE GLUCONATE CLOTH 2 % EX PADS
6.0000 | MEDICATED_PAD | Freq: Every day | CUTANEOUS | Status: DC
  Administered 2022-01-08 – 2022-01-09 (×2): 6 via TOPICAL

## 2022-01-07 MED ORDER — SODIUM CHLORIDE 0.9% IV SOLUTION: Freq: Once | INTRAVENOUS | Status: DC

## 2022-01-07 MED ORDER — DARBEPOETIN ALFA 200 MCG/0.4ML IJ SOSY
200.0000 ug | PREFILLED_SYRINGE | INTRAMUSCULAR | Status: DC
  Filled 2022-01-07: qty 0.4

## 2022-01-07 MED ORDER — HEPARIN SODIUM (PORCINE) 5000 UNIT/ML IJ SOLN: 5000.0000 [IU] | Freq: Three times a day (TID) | INTRAMUSCULAR | Status: DC

## 2022-01-07 MED ORDER — SODIUM CHLORIDE 0.9 % IV SOLN: 250.0000 mL | INTRAVENOUS | Status: DC | PRN

## 2022-01-07 MED ORDER — FUROSEMIDE 10 MG/ML IJ SOLN
120.0000 mg | Freq: Once | INTRAVENOUS | Status: AC
  Administered 2022-01-07: 120 mg via INTRAVENOUS
  Filled 2022-01-07: qty 10

## 2022-01-07 MED ORDER — TECHNETIUM TO 99M ALBUMIN AGGREGATED
4.0000 | Freq: Once | INTRAVENOUS | Status: AC
  Administered 2022-01-07: 4.1 via INTRAVENOUS

## 2022-01-07 MED ORDER — ACETAMINOPHEN 325 MG PO TABS
650.0000 mg | ORAL_TABLET | Freq: Four times a day (QID) | ORAL | Status: DC | PRN
  Administered 2022-01-08: 650 mg via ORAL
  Filled 2022-01-07: qty 2

## 2022-01-07 MED ORDER — BISACODYL 5 MG PO TBEC: 5.0000 mg | DELAYED_RELEASE_TABLET | Freq: Every day | ORAL | Status: DC | PRN

## 2022-01-07 MED ORDER — SODIUM CHLORIDE 0.9% FLUSH: 3.0000 mL | Freq: Two times a day (BID) | INTRAVENOUS | Status: DC

## 2022-01-07 MED ORDER — SODIUM CHLORIDE 0.9 % IV SOLN: INTRAVENOUS | Status: DC

## 2022-01-07 MED ORDER — ACETAMINOPHEN 325 MG PO TABS
650.0000 mg | ORAL_TABLET | Freq: Once | ORAL | Status: AC
  Administered 2022-01-07: 650 mg via ORAL
  Filled 2022-01-07: qty 2

## 2022-01-07 MED ORDER — FUROSEMIDE 10 MG/ML IJ SOLN
40.0000 mg | Freq: Once | INTRAMUSCULAR | Status: AC
  Administered 2022-01-07: 40 mg via INTRAVENOUS
  Filled 2022-01-07: qty 4

## 2022-01-07 NOTE — Consult Note (Signed)
[name] Renal Consultation Note  Requesting MD: Shahmehdi Indication for Consultation: ESRD-  volume overload   HPI:  [name] [name] is a 88 y.o. male with past medical history significant for HTN, CHF, hyperlipidemia-  has recently started dialysis as an OP-  TTS- [facility].  He came into the ER today with complaints of SOB which has worsened since HD on Saturday- he is noted to be in Afib-  rate of around 100-  CXR= small bilat pleural effusions with associated edema- BNP 1045 hgb of 7.4.  We are asked to provide routine HD.  Our HD nurse is occupied for the rest of the day so I had told the ER to give 160 mg of lasix IV on the off chance that he still makes urine-  plan is for 2 units of PRBC.  He denies overt bleeding  Creatinine, Ser  Date/Time Value Ref Range Status  01/07/2022 11:18 AM 4.58 (H) 0.61 - 1.24 mg/dL Final  12/27/2021 10:55 AM 5.02 (H) 0.61 - 1.24 mg/dL Final  11/29/2021 03:03 PM 4.39 (H) 0.61 - 1.24 mg/dL Final  08/23/2021 11:43 AM 3.89 (H) 0.61 - 1.24 mg/dL Final  08/08/2021 12:33 PM 3.24 (H) 0.61 - 1.24 mg/dL Final  07/13/2021 10:47 AM 3.12 (H) 0.61 - 1.24 mg/dL Final  07/04/2021 02:28 PM 2.99 (H) 0.61 - 1.24 mg/dL Final  06/29/2021 04:22 PM 3.01 (H) 0.61 - 1.24 mg/dL Final  05/21/2021 06:34 AM 3.21 (H) 0.61 - 1.24 mg/dL Final  05/20/2021 05:22 PM 3.04 (H) 0.61 - 1.24 mg/dL Final  05/20/2021 11:20 AM 3.07 (H) 0.61 - 1.24 mg/dL Final  05/19/2021 11:39 PM 3.33 (H) 0.61 - 1.24 mg/dL Final  04/17/2021 02:19 PM 3.14 (H) 0.61 - 1.24 mg/dL Final  03/15/2021 01:29 PM 2.79 (H) 0.61 - 1.24 mg/dL Final  12/12/2020 12:58 PM 4.80 (H) 0.61 - 1.24 mg/dL Final  06/06/2020 05:59 AM 2.50 (H) 0.61 - 1.24 mg/dL Final  06/05/2020 06:05 AM 2.47 (H) 0.61 - 1.24 mg/dL Final  06/03/2020 05:43 AM 3.18 (H) 0.61 - 1.24 mg/dL Final  06/01/2020 05:29 AM 3.32 (H) 0.61 - 1.24 mg/dL Final  05/30/2020 05:09 AM 3.85 (H) 0.61 - 1.24 mg/dL Final  05/29/2020 03:24  PM 3.81 (H) 0.61 - 1.24 mg/dL Final  05/28/2020 05:09 PM 3.73 (H) 0.61 - 1.24 mg/dL Final  05/15/2020 06:44 AM 4.70 (H) 0.61 - 1.24 mg/dL Final  05/03/2020 01:17 PM 3.79 (H) 0.61 - 1.24 mg/dL Final  10/30/2018 03:26 PM 2.55 (H) 0.61 - 1.24 mg/dL Final  09/17/2018 07:36 AM 2.51 (H) 0.61 - 1.24 mg/dL Final  09/16/2018 07:00 AM 2.44 (H) 0.61 - 1.24 mg/dL Final  09/14/2018 03:00 AM 2.68 (H) 0.61 - 1.24 mg/dL Final  09/09/2018 05:29 AM 2.52 (H) 0.61 - 1.24 mg/dL Final  09/08/2018 08:03 AM 2.90 (H) 0.61 - 1.24 mg/dL Final  09/07/2018 12:28 PM 3.39 (H) 0.61 - 1.24 mg/dL Final  09/04/2018 07:00 AM 2.93 (H) 0.61 - 1.24 mg/dL Final  08/30/2018 11:30 AM 3.04 (H) 0.61 - 1.24 mg/dL Final  08/24/2018 07:46 AM 2.93 (H) 0.61 - 1.24 mg/dL Final  08/15/2018 08:30 AM 3.15 (H) 0.61 - 1.24 mg/dL Final  07/24/2018 07:21 AM 8.02 (H) 0.61 - 1.24 mg/dL Final  07/23/2018 08:49 AM 5.70 (H) 0.61 - 1.24 mg/dL Final  07/22/2018 05:37 AM 5.11 (H) 0.61 - 1.24 mg/dL Final  07/21/2018 02:25 PM 3.62 (H) 0.61 - 1.24 mg/dL Final  07/20/2018 02:03 PM 4.54 (H) 0.61 - 1.24 mg/dL  Final  07/20/2018 07:00 AM 4.06 (H) 0.61 - 1.24 mg/dL Final  07/19/2018 03:17 PM 3.71 (H) 0.61 - 1.24 mg/dL Final  07/18/2018 08:30 AM 3.58 (H) 0.61 - 1.24 mg/dL Final  07/08/2018 05:14 AM 4.19 (H) 0.61 - 1.24 mg/dL Final  07/03/2018 02:59 AM 3.01 (H) 0.61 - 1.24 mg/dL Final  01/26/2012 10:21 AM 1.97 (H) 0.50 - 1.35 mg/dL Final  11/29/2011 10:35 PM 1.99 (H) 0.50 - 1.35 mg/dL Final  07/25/2011 04:04 PM 2.3 (H) 0.4 - 1.5 mg/dL Final  02/27/2010 01:08 PM 1.88 (H) 0.4 - 1.5 mg/dL Final  07/26/2008 09:17 AM 1.7 (H) 0.4 - 1.5 mg/dL Final  12/11/2007 01:02 AM 1.92 (H)  Final  12/08/2007 12:18 PM 1.7 (H) 0.4 - 1.5 mg/dL Final     PMHx:   Past Medical History:  Diagnosis Date   Anemia    Anxiety    Artificial opening care, other    pt states that he has artifical sphincter, unable to have foley cath placed.    Cancer ([place])    prostate ca  1999/removal/ rad tx   Carotid artery occlusion    Chronic diastolic heart failure (HCC)    CKD (chronic kidney disease)    Depression    DVT (deep venous thrombosis) (HCC)    Gout    History of kidney stones    Hyperlipidemia    Hypertension    Hyponatremia    Incontinence    Reflux    Renal disorder    Status post implantation of artificial urinary sphincter    Wears dentures    Wears glasses     Past Surgical History:  Procedure Laterality Date   AV FISTULA PLACEMENT Left 05/15/2020   Procedure: LEFT FOREARM  ARTERIOVENOUS (AV) GRAFT INSERTION;  Surgeon: Angelia Mould, MD;  Location: [facility] OR;  Service: Vascular;  Laterality: Left;   CARDIAC CATHETERIZATION     2000-2001   cataracts     CORONARY STENT PLACEMENT     HERNIA REPAIR     MULTIPLE TOOTH EXTRACTIONS     NEPHROSTOMY     radical prostectomy     URINARY SPHINCTER IMPLANT      Family Hx:  Family History  Problem Relation Age of Onset   Coronary artery disease Other        family history, male < 72   Arthritis Other        family history   Cancer Other    Kidney disease Other     Social History:  reports that he quit smoking about 49 years ago. His smoking use included cigarettes. He started smoking about 59 years ago. He smoked an average of 2 packs per day. He has never used smokeless tobacco. He reports that he does not drink alcohol and does not use drugs.  Allergies:  Allergies  Allergen Reactions   Ivp Dye [Iodinated Contrast Media]     Due to partial renal failure   Nalbuphine Other (See Comments)    PATIENT STATES IT MADE HIM FEEL LIKE HE WAS GOING TO DIE. NO SPECIFICS   Codeine Anxiety    Medications: Prior to Admission medications   Medication Sig Start Date End Date Taking? Authorizing Provider  acetaminophen (TYLENOL) 500 MG tablet Take 1,000 mg by mouth every 8 (eight) hours as needed for mild pain or moderate pain. For headache pain   Yes [provider]  albuterol  (VENTOLIN HFA) 108 (90 Base) MCG/ACT inhaler Inhale into the lungs every 6 (six)  hours as needed for wheezing or shortness of breath.   Yes [provider]  allopurinol (ZYLOPRIM) 100 MG tablet Take 100 mg by mouth daily.  10/07/18  Yes [provider]  ALPRAZolam Duanne Moron) 0.5 MG tablet Take 1 tablet (0.5 mg total) by mouth 2 (two) times daily as needed for anxiety. 06/06/20  Yes Emokpae, Courage, MD  amLODipine (NORVASC) 10 MG tablet Take 10 mg by mouth daily. 03/27/20  Yes [provider]  ARIPiprazole (ABILIFY) 20 MG tablet Take 1 tablet (20 mg total) by mouth daily. 06/06/20  Yes Emokpae, Courage, MD  carvedilol (COREG) 6.25 MG tablet Take 6.25 mg by mouth 2 (two) times daily. 05/02/21  Yes [provider]  epoetin alfa (EPOGEN) 3000 UNIT/ML injection Inject 3,000 Units into the skin every 14 (fourteen) days.    Yes [provider]  finasteride (PROSCAR) 5 MG tablet Take 1 tablet (5 mg total) by mouth daily. 06/01/21  Yes McKenzie, Candee Furbish, MD  fish oil-omega-3 fatty acids 1000 MG capsule Take 1 g by mouth daily.   Yes [provider]  furosemide (LASIX) 20 MG tablet Take 80 mg by mouth 2 (two) times daily. 12/28/21  Yes [provider]  lansoprazole (PREVACID) 15 MG capsule Take 1 capsule (15 mg total) by mouth 2 (two) times daily before a meal. 06/06/20  Yes Emokpae, Courage, MD  Multiple Vitamin (MULTIVITAMIN WITH MINERALS) TABS tablet Take 1 tablet by mouth daily.   Yes [provider]  ofloxacin (FLOXIN) 0.3 % OTIC solution Place 5 drops into both ears daily. 10/19/18  Yes [provider]  rosuvastatin (CRESTOR) 10 MG tablet Take 10 mg by mouth daily.   Yes [provider]  sertraline (ZOLOFT) 100 MG tablet Take 1 tablet (100 mg total) by mouth daily. 06/06/20  Yes Emokpae, Courage, MD  sevelamer carbonate (RENVELA) 800 MG tablet Take 800 mg by mouth 3 (three) times daily. 12/28/21  Yes [provider]   sodium bicarbonate 650 MG tablet Take 1 tablet (650 mg total) by mouth 3 (three) times daily. 06/06/20  Yes Roxan Hockey, MD  amoxicillin-clavulanate (AUGMENTIN) 875-125 MG tablet Take 1 tablet by mouth in the morning and at bedtime. Patient not taking: Reported on 01/07/2022    [provider]  cefdinir (OMNICEF) 300 MG capsule Take 1 capsule (300 mg total) by mouth daily. Patient not taking: Reported on 12/27/2021 05/22/21   Orson Eva, MD  tamsulosin (FLOMAX) 0.4 MG CAPS capsule Take 1 capsule (0.4 mg total) by mouth daily after supper. Patient not taking: Reported on 12/27/2021 06/01/21   Cleon Gustin, MD  traMADol (ULTRAM) 50 MG tablet Take 1 tablet (50 mg total) by mouth every 6 (six) hours as needed. Patient not taking: Reported on 12/27/2021 05/15/20   Angelia Mould, MD    I have reviewed the patient's current medications.  Labs:  Results for orders placed or performed during the hospital encounter of 01/07/22 (from the past 48 hour(s))  Resp Panel by RT-PCR (Flu A&B, Covid) Nasopharyngeal Swab     Status: None   Collection Time: 01/07/22 11:12 AM   Specimen: Nasopharyngeal Swab; Nasopharyngeal(NP) swabs in vial transport medium  Result Value Ref Range   SARS Coronavirus 2 by RT PCR NEGATIVE NEGATIVE    Comment: (NOTE) SARS-CoV-2 target nucleic acids are NOT DETECTED.  The SARS-CoV-2 RNA is generally detectable in upper respiratory specimens during the acute phase of infection. The lowest concentration of SARS-CoV-2 viral copies this assay can  detect is 138 copies/mL. A negative result does not preclude SARS-Cov-2 infection and should not be used as the sole basis for treatment or other patient management decisions. A negative result may occur with  improper specimen collection/handling, submission of specimen other than nasopharyngeal swab, presence of viral mutation(s) within the areas targeted by this assay, and inadequate number of viral copies(<138  copies/mL). A negative result must be combined with clinical observations, patient history, and epidemiological information. The expected result is Negative.  Fact Sheet for Patients:  EntrepreneurPulse.com.au  Fact Sheet for Healthcare Providers:  IncredibleEmployment.be  This test is no t yet approved or cleared by the [place] FDA and  has been authorized for detection and/or diagnosis of SARS-CoV-2 by FDA under an Emergency Use Authorization (EUA). This EUA will remain  in effect (meaning this test can be used) for the duration of the COVID-19 declaration under Section 564(b)(1) of the Act, 21 U.S.C.section 360bbb-3(b)(1), unless the authorization is terminated  or revoked sooner.       Influenza A by PCR NEGATIVE NEGATIVE   Influenza B by PCR NEGATIVE NEGATIVE    Comment: (NOTE) The Xpert Xpress SARS-CoV-2/FLU/RSV plus assay is intended as an aid in the diagnosis of influenza from Nasopharyngeal swab specimens and should not be used as a sole basis for treatment. Nasal washings and aspirates are unacceptable for Xpert Xpress SARS-CoV-2/FLU/RSV testing.  Fact Sheet for Patients: EntrepreneurPulse.com.au  Fact Sheet for Healthcare Providers: IncredibleEmployment.be  This test is not yet approved or cleared by the [place] FDA and has been authorized for detection and/or diagnosis of SARS-CoV-2 by FDA under an Emergency Use Authorization (EUA). This EUA will remain in effect (meaning this test can be used) for the duration of the COVID-19 declaration under Section 564(b)(1) of the Act, 21 U.S.C. section 360bbb-3(b)(1), unless the authorization is terminated or revoked.  Performed at [facility], [address] [place], [place] [address]   CBC with Differential/Platelet     Status: Abnormal   Collection Time: 01/07/22 11:18 AM  Result Value Ref Range   WBC 9.8 4.0 - 10.5 K/uL   RBC 2.17  (L) 4.22 - 5.81 MIL/uL   Hemoglobin 7.0 (L) 13.0 - 17.0 g/dL   HCT 22.8 (L) 39.0 - 52.0 %   MCV 105.1 (H) 80.0 - 100.0 fL   MCH 32.3 26.0 - 34.0 pg   MCHC 30.7 30.0 - 36.0 g/dL   RDW 14.1 11.5 - 15.5 %   Platelets 191 150 - 400 K/uL   nRBC 0.0 0.0 - 0.2 %   Neutrophils Relative % 86 %   Neutro Abs 8.4 (H) 1.7 - 7.7 K/uL   Lymphocytes Relative 6 %   Lymphs Abs 0.6 (L) 0.7 - 4.0 K/uL   Monocytes Relative 7 %   Monocytes Absolute 0.6 0.1 - 1.0 K/uL   Eosinophils Relative 0 %   Eosinophils Absolute 0.0 0.0 - 0.5 K/uL   Basophils Relative 0 %   Basophils Absolute 0.0 0.0 - 0.1 K/uL   Immature Granulocytes 1 %   Abs Immature Granulocytes 0.05 0.00 - 0.07 K/uL    Comment: Performed at [facility], [address] [place], [place] [address]  Comprehensive metabolic panel     Status: Abnormal   Collection Time: 01/07/22 11:18 AM  Result Value Ref Range   Sodium 134 (L) 135 - 145 mmol/L   Potassium 3.4 (L) 3.5 - 5.1 mmol/L   Chloride 92 (L) 98 - 111 mmol/L   CO2 29  22 - 32 mmol/L   Glucose, Bld 135 (H) 70 - 99 mg/dL    Comment: Glucose reference range applies only to samples taken after fasting for at least 8 hours.   BUN 36 (H) 8 - 23 mg/dL   Creatinine, Ser 4.58 (H) 0.61 - 1.24 mg/dL   Calcium 8.5 (L) 8.9 - 10.3 mg/dL   Total Protein 7.4 6.5 - 8.1 g/dL   Albumin 3.0 (L) 3.5 - 5.0 g/dL   AST 22 15 - 41 U/L   ALT 15 0 - 44 U/L   Alkaline Phosphatase 70 38 - 126 U/L   Total Bilirubin 0.3 0.3 - 1.2 mg/dL   GFR, Estimated 12 (L) >60 mL/min    Comment: (NOTE) Calculated using the CKD-EPI Creatinine Equation (2021)    Anion gap 13 5 - 15    Comment: Performed at [facility], [address] [place], [place] [address]  Troponin I (High Sensitivity)     Status: None   Collection Time: 01/07/22 11:18 AM  Result Value Ref Range   Troponin I (High Sensitivity) 8 <18 ng/L    Comment: (NOTE) Elevated high sensitivity troponin I (hsTnI) values and significant  changes across serial  measurements may suggest ACS but many other  chronic and acute conditions are known to elevate hsTnI results.  Refer to the "Links" section for chest pain algorithms and additional  guidance. Performed at [facility], [address] [place], [place] [address]   D-dimer, quantitative     Status: Abnormal   Collection Time: 01/07/22 11:18 AM  Result Value Ref Range   D-Dimer, Quant 3.78 (H) 0.00 - 0.50 ug/mL-FEU    Comment: (NOTE) At the manufacturer cut-off value of 0.5 g/mL FEU, this assay has a negative predictive value of 95-100%.This assay is intended for use in conjunction with a clinical pretest probability (PTP) assessment model to exclude pulmonary embolism (PE) and deep venous thrombosis (DVT) in outpatients suspected of PE or DVT. Results should be correlated with clinical presentation. Performed at [facility], [address], [place], [place] [address]   Brain natriuretic peptide     Status: Abnormal   Collection Time: 01/07/22 11:18 AM  Result Value Ref Range   B Natriuretic Peptide 1,222.0 (H) 0.0 - 100.0 pg/mL    Comment: Performed at [facility], [address], [place], [place] [address]  Troponin I (High Sensitivity)     Status: None   Collection Time: 01/07/22  1:05 PM  Result Value Ref Range   Troponin I (High Sensitivity) 9 <18 ng/L    Comment: (NOTE) Elevated high sensitivity troponin I (hsTnI) values and significant  changes across serial measurements may suggest ACS but many other  chronic and acute conditions are known to elevate hsTnI results.  Refer to the "Links" section for chest pain algorithms and additional  guidance. Performed at [facility], [address], [place], [place] [address]   Brain natriuretic peptide     Status: Abnormal   Collection Time: 01/07/22  1:33 PM  Result Value Ref Range   B Natriuretic Peptide 1,045.0 (H) 0.0 - 100.0 pg/mL    Comment: Performed at [facility], [address] [place], [place] [address]  Prepare RBC  (crossmatch)     Status: None   Collection Time: 01/07/22  3:08 PM  Result Value Ref Range   Order Confirmation      ORDER PROCESSED BY BLOOD BANK Performed at [facility], [address], [place], [place] [address]  Type and screen [facility]     Status: None (Preliminary result)   Collection Time: 01/07/22  4:04 PM  Result Value Ref Range   ABO/RH(D) O POS    Antibody Screen PENDING    Sample Expiration      01/10/2022,2359 Performed at [facility], [address] [place], [place] [address]   Hemoglobin and hematocrit, blood     Status: Abnormal   Collection Time: 01/07/22  4:12 PM  Result Value Ref Range   Hemoglobin 7.4 (L) 13.0 - 17.0 g/dL   HCT 23.6 (L) 39.0 - 52.0 %    Comment: Performed at [facility], [address] [place], [place] [address]     ROS:  Cardiovascular: positive for chest pain, irregular heart beat, and lower extremity edema  also DOE-  but no SOB at rest  Physical Exam: Vitals:   01/07/22 1230 01/07/22 1300  BP: 114/62 (!) 127/51  Pulse: 69 96  Resp: (!) 33 (!) 22  Temp:    SpO2: 96% 98%     General:  lying still-  mask on -  no inc WOB-  no O2-  flat affect HEENT: PERRLA, EOMI, mucous membranes moist Neck: no JVD Heart: irreg, irreg Lungs:  dec BS at bases to ant exam Abdomen: soft, non tender Extremities: mild pitting edema Skin: senile purpura-  warm and dry- pale Neuro: alert, flat affect Left AVF-  patent-  some bruising   Assessment/Plan: 88 year old WM with new ESRD-  has been on HD for a week-  now presents with DOE-  new onset Afib and worsening anemia 1.Renal- new start to dialysis -  has only been on for a week -  going OK according to him.  His BUN, electrolytes are reasonable-  would not expect would be giving him symptoms.  Plan for routine HD in the AM via AVF 2. Hypertension/volume  - CXR shows bilat effusions and edema-  he also has a little peripheral edema-  will plan to UF as able with HD-  home med list  indicates amlodipine and coreg-  would hold at least amlodipine right now so that we will have BP cushion to increase UF 3.  SOB/DOE-  most likely a combo of volume overload, new onset Afib and anemia 4. Afib-  rate around 100-  to be started on eliquis and stay on beta blocker 5. Anemia  - not unusual for ESRD patient to have anemia-  to be given blood with HD-  will also give IV iron and ESA  6. Bones-   calc and phos WNL-  no bone active meds for now    Louis Meckel 01/07/2022, 4:42 PM

## 2022-01-07 NOTE — Progress Notes (Signed)
°   01/07/22 1829  Vitals  Vital Signs Type (Include Temp, Pulse, RR, and B/P) 15 min. post blood start  Temp 98.1 F (36.7 C)  Temp Source Oral  Pulse Rate (!) 102  Resp 20  BP 115/71  Oxygen Therapy  SpO2 96 %  O2 Device Room Air

## 2022-01-07 NOTE — Assessment & Plan Note (Addendum)
-  BP is very soft, stop amlodipine, stop hydralazine, stop Lasix, patient ultimately make much urine -Coreg discontinued due to soft BP, will use metoprolol 25 mg twice daily for rate control -- Start midodrine on hemodialysis days

## 2022-01-07 NOTE — Hospital Course (Addendum)
[name] [name] is a 88 year old male with history of ESRD -recently started on hemodialysis( TTS), HTN, HLD, GERD, Systolic CHF, Depression, h/o hematuria, Chronic anemia, Chronic hyponatremia, Multiple hospitalizations at [facility].  Multiple ED visits.AV graft Rt arm, He has received EPO all injection as an outpatient. Presented today with chief complaint of shortness of breath.  Progressively worsened after hemodialysis on Saturday. Denies any chest pain, weight gain, weight loss with hemodialysis 0.5 lbs   ED:  Blood pressure (!) 127/51, pulse 96, temperature 98.6 F (37 C), temperature source Oral, resp. rate (!) 22, height 5\' 8"  (1.727 m), weight 77.1 kg, SpO2 98 %.  -Was found in A-fib heart rate 69-1 04 currently 96 - LABS: Hemoglobin 7.0, sodium 134, potassium 3.4, chloride 92, BUN 36 creatinine 4.58, BNP1,222 -Elevated D-dimer 3.78, INR 1.2, -SARS-CoV-2, influenza A/B all negative -Chest x-ray reviewed increased small bilateral pleural effusion -EKG with a heart rate of 97, and A-fib (reviewing records January, 26 2023 was also in A-fib)

## 2022-01-07 NOTE — H&P (Signed)
History and Physical   Patient: [name] [name]                            PCP: [name], MD                    DOB: 12-26-1933            DOA: 01/07/2022 UTM:[id number]             DOS: 01/07/2022, 3:03 PM  [name], MD  Patient coming from:   HOME  I have personally reviewed patient's medical records, in electronic medical records, including:  [facility], and care everywhere.    Chief Complaint:   Chief Complaint  Patient presents with   Shortness of Breath    History of present illness:    [name] [name] is a 88 year old male with history of ESRD -recently started on hemodialysis( TTS), HTN, HLD, GERD, Systolic CHF, Depression, h/o hematuria, Chronic anemia, Chronic hyponatremia, Multiple hospitalizations at [facility].  Multiple ED visits.AV graft Rt arm, He has received EPO all injection as an outpatient. Presented today with chief complaint of shortness of breath.  Progressively worsened after hemodialysis on Saturday. Denies any chest pain, weight gain, weight loss with hemodialysis 0.5 lbs    Patient Denies having: Fever, Chills, Cough, Chest Pain, Abd pain, N/V/D, headache, dizziness, lightheadedness,  Dysuria, Joint pain, rash, open wounds  ED Course:   Blood pressure (!) 127/51, pulse 96, temperature 98.6 F (37 C), temperature source Oral, resp. rate (!) 22, height 5\' 8"  (1.727 m), weight 77.1 kg, SpO2 98 %. Abnormal labs;   -Was found in A-fib heart rate 69-1 04 currently 96 - LABS: Hemoglobin 7.0, sodium 134, potassium 3.4, chloride 92, BUN 36 creatinine 4.58, BNP1,222 -Elevated D-dimer 3.78, INR 1.2, -SARS-CoV-2, influenza A/B all negative -Chest x-ray reviewed increased small bilateral pleural effusion -EKG with a heart rate of 97, and A-fib (reviewing records January, 26 2023 was also in A-fib)   Review of Systems: As per HPI, otherwise 10 point review of systems were negative.    ----------------------------------------------------------------------------------------------------------------------  Allergies  Allergen Reactions   Ivp Dye [Iodinated Contrast Media]     Due to partial renal failure   Nalbuphine Other (See Comments)    PATIENT STATES IT MADE HIM FEEL LIKE HE WAS GOING TO DIE. NO SPECIFICS   Codeine Anxiety    Home MEDs:  Prior to Admission medications   Medication Sig Start Date End Date Taking? Authorizing Provider  acetaminophen (TYLENOL) 500 MG tablet Take 1,000 mg by mouth every 8 (eight) hours as needed for mild pain or moderate pain. For headache pain   Yes [provider]  albuterol (VENTOLIN HFA) 108 (90 Base) MCG/ACT inhaler Inhale into the lungs every 6 (six) hours as needed for wheezing or shortness of breath.   Yes [provider]  allopurinol (ZYLOPRIM) 100 MG tablet Take 100 mg by mouth daily.  10/07/18  Yes [provider]  ALPRAZolam Duanne Moron) 0.5 MG tablet Take 1 tablet (0.5 mg total) by mouth 2 (two) times daily as needed for anxiety. 06/06/20  Yes Emokpae, Courage, MD  amLODipine (NORVASC) 10 MG tablet Take 10 mg by mouth daily. 03/27/20  Yes [provider]  ARIPiprazole (ABILIFY) 20 MG tablet Take 1 tablet (20 mg total) by mouth daily. 06/06/20  Yes Emokpae, Courage, MD  carvedilol (COREG) 6.25 MG tablet Take 6.25 mg by mouth  2 (two) times daily. 05/02/21  Yes [provider]  epoetin alfa (EPOGEN) 3000 UNIT/ML injection Inject 3,000 Units into the skin every 14 (fourteen) days.    Yes [provider]  finasteride (PROSCAR) 5 MG tablet Take 1 tablet (5 mg total) by mouth daily. 06/01/21  Yes McKenzie, Candee Furbish, MD  fish oil-omega-3 fatty acids 1000 MG capsule Take 1 g by mouth daily.   Yes [provider]  furosemide (LASIX) 20 MG tablet Take 80 mg by mouth 2 (two) times daily. 12/28/21  Yes [provider]  lansoprazole (PREVACID) 15 MG capsule Take 1 capsule (15 mg  total) by mouth 2 (two) times daily before a meal. 06/06/20  Yes Emokpae, Courage, MD  Multiple Vitamin (MULTIVITAMIN WITH MINERALS) TABS tablet Take 1 tablet by mouth daily.   Yes [provider]  ofloxacin (FLOXIN) 0.3 % OTIC solution Place 5 drops into both ears daily. 10/19/18  Yes [provider]  rosuvastatin (CRESTOR) 10 MG tablet Take 10 mg by mouth daily.   Yes [provider]  sertraline (ZOLOFT) 100 MG tablet Take 1 tablet (100 mg total) by mouth daily. 06/06/20  Yes Emokpae, Courage, MD  sevelamer carbonate (RENVELA) 800 MG tablet Take 800 mg by mouth 3 (three) times daily. 12/28/21  Yes [provider]  sodium bicarbonate 650 MG tablet Take 1 tablet (650 mg total) by mouth 3 (three) times daily. 06/06/20  Yes Roxan Hockey, MD  amoxicillin-clavulanate (AUGMENTIN) 875-125 MG tablet Take 1 tablet by mouth in the morning and at bedtime. Patient not taking: Reported on 01/07/2022    [provider]  cefdinir (OMNICEF) 300 MG capsule Take 1 capsule (300 mg total) by mouth daily. Patient not taking: Reported on 12/27/2021 05/22/21   Orson Eva, MD  tamsulosin (FLOMAX) 0.4 MG CAPS capsule Take 1 capsule (0.4 mg total) by mouth daily after supper. Patient not taking: Reported on 12/27/2021 06/01/21   Cleon Gustin, MD  traMADol (ULTRAM) 50 MG tablet Take 1 tablet (50 mg total) by mouth every 6 (six) hours as needed. Patient not taking: Reported on 12/27/2021 05/15/20   Angelia Mould, MD    PRN MEDs: sodium chloride, acetaminophen **OR** acetaminophen, bisacodyl, hydrALAZINE, HYDROmorphone (DILAUDID) injection, ipratropium, levalbuterol, ondansetron **OR** ondansetron (ZOFRAN) IV, oxyCODONE, senna-docusate, sodium chloride flush, traZODone  Past Medical History:  Diagnosis Date   Anemia    Anxiety    Artificial opening care, other    pt states that he has artifical sphincter, unable to have foley cath placed.    Cancer ([place])    prostate ca  1999/removal/ rad tx   Carotid artery occlusion    Chronic diastolic heart failure (HCC)    CKD (chronic kidney disease)    Depression    DVT (deep venous thrombosis) (HCC)    Gout    History of kidney stones    Hyperlipidemia    Hypertension    Hyponatremia    Incontinence    Reflux    Renal disorder    Status post implantation of artificial urinary sphincter    Wears dentures    Wears glasses     Past Surgical History:  Procedure Laterality Date   AV FISTULA PLACEMENT Left 05/15/2020   Procedure: LEFT FOREARM  ARTERIOVENOUS (AV) GRAFT INSERTION;  Surgeon: Angelia Mould, MD;  Location: [facility];  Service: Vascular;  Laterality: Left;   CARDIAC CATHETERIZATION     2000-2001   cataracts     CORONARY STENT PLACEMENT  HERNIA REPAIR     MULTIPLE TOOTH EXTRACTIONS     NEPHROSTOMY     radical prostectomy     URINARY SPHINCTER IMPLANT       reports that he quit smoking about 49 years ago. His smoking use included cigarettes. He started smoking about 59 years ago. He smoked an average of 2 packs per day. He has never used smokeless tobacco. He reports that he does not drink alcohol and does not use drugs.   Family History  Problem Relation Age of Onset   Coronary artery disease Other        family history, male < 42   Arthritis Other        family history   Cancer Other    Kidney disease Other     Physical Exam:   Vitals:   01/07/22 1130 01/07/22 1200 01/07/22 1230 01/07/22 1300  BP: 121/81 (!) 107/52 114/62 (!) 127/51  Pulse: 80 74 69 96  Resp: 19 18 (!) 33 (!) 22  Temp:      TempSrc:      SpO2: 96% 96% 96% 98%  Weight:      Height:       Constitutional: NAD, calm, comfortable Eyes: PERRL, lids and conjunctivae normal ENMT: Mucous membranes are moist. Posterior pharynx clear of any exudate or lesions.Normal dentition.  Neck: normal, supple, no masses, no thyromegaly Respiratory: Lower lobe Rales otherwise clear to auscultation bilaterally, no wheezing,  mild-moderate crackles bilateral lower lobes. Normal respiratory effort. No accessory muscle use.  Cardiovascular: Regular rate and rhythm, no murmurs / rubs / gallops. No extremity edema. 2+ pedal pulses. No carotid bruits.  Abdomen: no tenderness, no masses palpated. No hepatosplenomegaly. Bowel sounds positive.  Musculoskeletal: no clubbing / cyanosis. No joint deformity upper and lower extremities. Good ROM, no contractures. Normal muscle tone.  +2 pitting edema lower extremity bilateral Neurologic: CN II-XII grossly intact. Sensation intact, DTR normal. Strength 5/5 in all 4.  Psychiatric: Normal judgment and insight. Alert and oriented x 3. Normal mood.  Skin: no rashes, lesions, ulcers. No induration -right arm AV graft Wounds: per nursing documentation   Labs on admission:    I have personally reviewed following labs and imaging studies  CBC: Recent Labs  Lab 01/07/22 1118  WBC 9.8  NEUTROABS 8.4*  HGB 7.0*  HCT 22.8*  MCV 105.1*  PLT 829   Basic Metabolic Panel: Recent Labs  Lab 01/07/22 1118  NA 134*  K 3.4*  CL 92*  CO2 29  GLUCOSE 135*  BUN 36*  CREATININE 4.58*  CALCIUM 8.5*   GFR: Estimated Creatinine Clearance: 11.4 mL/min (A) (by C-G formula based on SCr of 4.58 mg/dL (H)). Liver Function Tests: Recent Labs  Lab 01/07/22 1118  AST 22  ALT 15  ALKPHOS 70  BILITOT 0.3  PROT 7.4  ALBUMIN 3.0*    Urine analysis:    Component Value Date/Time   COLORURINE YELLOW 08/23/2021 1143   APPEARANCEUR CLEAR 08/23/2021 1143   LABSPEC 1.020 08/23/2021 1143   PHURINE 6.0 08/23/2021 1143   GLUCOSEU NEGATIVE 08/23/2021 1143   HGBUR MODERATE (A) 08/23/2021 1143   BILIRUBINUR NEGATIVE 08/23/2021 1143   KETONESUR NEGATIVE 08/23/2021 1143   PROTEINUR 100 (A) 08/23/2021 1143   UROBILINOGEN 0.2 03/14/2010 0809   NITRITE NEGATIVE 08/23/2021 1143   LEUKOCYTESUR NEGATIVE 08/23/2021 1143    Last A1C:  Lab Results  Component Value Date   HGBA1C 5.5 05/29/2020      Radiologic Exams on Admission:  DG Chest Port 1 View  Result Date: 01/07/2022 CLINICAL DATA:  Shortness of breath. EXAM: PORTABLE CHEST 1 VIEW COMPARISON:  December 27, 2021. FINDINGS: Stable cardiomegaly. Small bilateral pleural effusions are noted with associated bibasilar atelectasis or edema. Old left clavicular fracture is noted. IMPRESSION: Small bilateral pleural effusions with associated bibasilar atelectasis or edema. Electronically Signed   By: Marijo Conception M.D.   On: 01/07/2022 11:57    EKG:   Independently reviewed.  Orders placed or performed during the hospital encounter of 01/07/22   EKG 12-Lead   EKG 12-Lead   EKG 12-Lead   ---------------------------------------------------------------------------------------------------------------------------------------    Assessment / Plan:   Principal Problem:   SOB (shortness of breath) Active Problems:   Anemia in chronic kidney disease   ESRD (end stage renal disease) (HCC)   Chronic systolic CHF (congestive heart failure) (HCC)   Paroxysmal A-fib (HCC)   CAD (coronary artery disease)   Essential hypertension   Hyperlipidemia   Personal history of prostate cancer   Assessment and Plan: * SOB (shortness of breath)- (present on admission) - Likely due to volume overload -Obvious lower extremity pitting edema, -Last dialysis Saturday per patient-only lost half a pound -ED discussed with nephrology patient needs to be evaluated--pending dialysis today or tomorrow -Currently stable, satting 98% on room air  Anemia in chronic kidney disease- (present on admission) - Anemia of chronic disease, chronic kidney disease, with iron deficiency anemia - Hemoglobin 7.0 -Per caregiver patient and his POA son he has anemia of chronic disease, iron deficiency anemia, apparently gets Epogen shots as an outpatient -Hemoglobin 8.7-9 (January 26 was 8.8) -Reports of no hematuria or hematochezia -Pros and cons of blood  transfusion discussed Patient is agree to blood transfusion -Anticipating 2 units RBC transfusion -likely with hemodialysis -Pending Hemoccult -Iron studies  Chronic systolic CHF (congestive heart failure) ([place])- (present on admission) -?  Chronic diastolic versus systolic heart failure -Last echocardiogram reviewed from 05/21/2021; Normal Left ventricular ejection fraction, by estimation, is 55 to 60% -Significant elevated proBNP not reliable due toCKD -Weight not reliable due to CKD, volume retention -We will repeat 2D echocardiogram:  -Continue statins, beta-blockers  ESRD (end stage renal disease) ([place])- (present on admission) -Per patient started on hemodialysis last week -Status post 3 session of hemodialysis, last on Saturday -Prior long history of kidney failure  - Patient has a right arm AV graft > 2 years -Volume overload, per patient on Saturday only lost 0.5 pound -Nephrologist been notified by ED provider, anticipating hemodialysis today or in a.m. -Patient reports he makes minimal urine  Paroxysmal A-fib ([place])- (present on admission) -Likely exacerbated by anemia, -Mali Vas score > 3 - Seems to be new onset A-fib, although multiple EKGs from January was reviewed revealing atrial fibrillation -Heart rate 90s to 104 -Rate control, and chronic anticoagulation was treatment discussed with the patient and POA his son on the phone.... They expressed understanding and agreement plan -Patient will be started on Eliquis low-dose -Already on Coreg, will be titrated better rate control -Echo from 05/07/2021 was reviewed, ejection fraction 55-60% -TSH -Cardiac enzymes negative x2  Chronic diastolic CHF (congestive heart failure) (HCC)-resolved as of 01/07/2022, (present on admission) .     Personal history of prostate cancer - In remission to follow-up as an outpatient -Continue Flomax  Hyperlipidemia- (present on admission) - Continue statins (Crestor)  Essential  hypertension- (present on admission) - Currently stable, home medication Norvasc, Coreg, -As needed hydralazine, Lasix  CAD (coronary artery disease)- (present  on admission) - Currently stable, continue beta-blockers, statins ?  See aspirin on med rec    Consults called: Nephrology/palliative care/ -------------------------------------------------------------------------------------------------------------------------------------------- DVT prophylaxis:  heparin injection 5,000 Units Start: 01/07/22 1400 TED hose Start: 01/07/22 1315 SCDs Start: 01/07/22 1315   Code Status:   Code Status: Full Code   Admission status: Patient will be admitted as Inpatient, with a greater than 2 midnight length of stay. Level of care: Telemetry   Family Communication:  none at bedside  (The above findings and plan of care has been discussed with patient in detail, the patient expressed understanding and agreement of above plan)  --------------------------------------------------------------------------------------------------------------------------------------------------  Disposition Plan: >3 days Status is: Inpatient Remains inpatient appropriate because: Needing evaluation for shortness of breath, volume overload, likely need hemodialysis, blood transfusion   -------------------------------------------------------------------------------------------------------------------------------------  Time spent: > than  75  Min.   SIGNED: Deatra James, MD, FHM. [facility],  Pager (Please use [email or web address] to page to text)  If 7PM-7AM, please contact night-coverage [email or web address],  01/07/2022, 3:03 PM

## 2022-01-07 NOTE — Progress Notes (Signed)
°   01/07/22 1732  Assess: MEWS Score  Temp 98.4 F (36.9 C)  BP (!) 146/105  Pulse Rate (!) 130  Resp 20  SpO2 93 %  O2 Device Room Air  Assess: MEWS Score  MEWS Temp 0  MEWS Systolic 0  MEWS Pulse 3  MEWS RR 0  MEWS LOC 0  MEWS Score 3  MEWS Score Color Yellow  Assess: if the MEWS score is Yellow or Red  Were vital signs taken at a resting state? Yes  Focused Assessment No change from prior assessment  Early Detection of Sepsis Score *See Row Information* Medium  MEWS guidelines implemented *See Row Information* Yes  Treat  MEWS Interventions Other (Comment)  Pain Scale 0-10  Pain Score 0  Notify: Charge Nurse/RN  Name of Charge Nurse/RN Notified Rachel, RN  Date Charge Nurse/RN Notified 01/07/22  Time Charge Nurse/RN Notified 47  Notify: Provider  Provider Name/Title Shahmedhi  Date Provider Notified 01/07/22  Time Provider Notified 1739  Notification Type Page  Notification Reason Change in status  Document  Patient Outcome Other (Comment)  Progress note created (see row info) Yes

## 2022-01-07 NOTE — Assessment & Plan Note (Addendum)
-  Likely exacerbated by anemia, -CHADs Vas score > 3 - Seems to be new onset A-fib, although multiple EKGs from January was reviewed revealing atrial fibrillation -Rate control, and chronic anticoagulation was discussed with the patient and POA his son on the phone.... They expressed understanding and agreement plan -Continue Eliquis 2.5 mg twice daily for stroke prophylaxis -Coreg discontinued due to soft BP, will use metoprolol 25 mg twice daily for rate control -Echo from 05/07/2021 was reviewed, ejection fraction 55-60% -Cardiac enzymes negative x2

## 2022-01-07 NOTE — ED Triage Notes (Signed)
Pt c/o difficulty breathing x 2-3 days. Worse with ambulation.

## 2022-01-07 NOTE — ED Notes (Signed)
Patient transported to [facility]

## 2022-01-07 NOTE — ED Notes (Addendum)
NEGATIVE HEMOCCULT

## 2022-01-07 NOTE — Assessment & Plan Note (Addendum)
-?    Chronic diastolic heart failure -Last echocardiogram reviewed from 05/21/2021; Normal Left ventricular ejection fraction, by estimation, is 55 to 60% -Significant elevated proBNP not reliable due toCKD -Weight not reliable due to CKD,  Coreg discontinued due to soft BP, will use metoprolol 25 mg twice daily for rate control -Continue to use hemodialysis to address volume status

## 2022-01-07 NOTE — Assessment & Plan Note (Addendum)
-   Acute on chronic anemia of chronickidney disease with iron deficiency anemia -Per caregiver patient and his POA son he has anemia of chronic disease, iron deficiency anemia, apparently gets Epogen shots as an outpatient -Hemoglobin 8.7-9 (January 26 was 8.8) -Reports of no hematuria or hematochezia -hgb improved significantly after transfusion, ESA and iron infusion

## 2022-01-07 NOTE — Progress Notes (Signed)
ANTICOAGULATION CONSULT NOTE - Initial Consult  Pharmacy Consult for eliquis Indication: atrial fibrillation  Allergies  Allergen Reactions   Ivp Dye [Iodinated Contrast Media]     Due to partial renal failure   Nalbuphine Other (See Comments)    PATIENT STATES IT MADE HIM FEEL LIKE HE WAS GOING TO DIE. NO SPECIFICS   Codeine Anxiety    Patient Measurements: Height: 5\' 8"  (172.7 cm) Weight: 77.1 kg (170 lb) IBW/kg (Calculated) : 68.4   Vital Signs: Temp: 98.6 F (37 C) (02/06 1100) Temp Source: Oral (02/06 1100) BP: 127/51 (02/06 1300) Pulse Rate: 96 (02/06 1300)  Labs: Recent Labs    01/07/22 1118 01/07/22 1305  HGB 7.0*  --   HCT 22.8*  --   PLT 191  --   CREATININE 4.58*  --   TROPONINIHS 8 9    Estimated Creatinine Clearance: 11.4 mL/min (A) (by C-G formula based on SCr of 4.58 mg/dL (H)).   Medical History: Past Medical History:  Diagnosis Date   Anemia    Anxiety    Artificial opening care, other    pt states that he has artifical sphincter, unable to have foley cath placed.    Cancer ([place])    prostate ca 1999/removal/ rad tx   Carotid artery occlusion    Chronic diastolic heart failure (HCC)    CKD (chronic kidney disease)    Depression    DVT (deep venous thrombosis) (HCC)    Gout    History of kidney stones    Hyperlipidemia    Hypertension    Hyponatremia    Incontinence    Reflux    Renal disorder    Status post implantation of artificial urinary sphincter    Wears dentures    Wears glasses     Medications:  See med rec   Assessment: 88 year old male with history of ESRD -recently started on hemodialysis( TTS), HTN, HLD, GERD, Systolic CHF, Depression, h/o hematuria, Chronic anemia, Chronic hyponatremia.Presented today with chief complaint of shortness of breath. Patient diagnosed with new onset afib. Pharmacy asked to dose eliquis  Goal of Therapy:   Monitor platelets by anticoagulation protocol: Yes   Plan:  Eliquis 2.5mg   po bid Monitor for S/S of bleeding Educated on [place], BS Pharm D, BCPS Clinical Pharmacist Pager [phone] 01/07/2022,3:12 PM

## 2022-01-07 NOTE — Progress Notes (Signed)
Rechecked VS patients HR down , will not escalate at this time. Notified Dr. Roger Shelter   Blood infusing at 181ml a hour     01/07/22 1732 01/07/22 1758  Assess: MEWS Score  Temp 98.4 F (36.9 C) 98.1 F (36.7 C)  BP (!) 146/105 109/72  Pulse Rate (!) 130 (!) 107  Resp 20 18  SpO2  --  93 %  O2 Device  --  Room Air  Assess: MEWS Score  MEWS Temp 0 0  MEWS Systolic 0 0  MEWS Pulse 3 1  MEWS RR 0 0  MEWS LOC 0 0  MEWS Score 3 1  MEWS Score Color Yellow Green  Assess: if the MEWS score is Yellow or Red  Were vital signs taken at a resting state?  --  Yes  Focused Assessment  --  No change from prior assessment  Early Detection of Sepsis Score *See Row Information*  --  Low  MEWS guidelines implemented *See Row Information*  --  No, vital signs rechecked  Treat  MEWS Interventions  --  Other (Comment)  Pain Scale  --  0-10  Pain Score  --  0  Notify: Charge Nurse/RN  Name of Charge Nurse/RN Notified  --  Facilities manager  Date Charge Nurse/RN Notified  --  01/07/22  Time Charge Nurse/RN Notified  --  1825  Notify: Provider  Provider Name/Title  --  Shahmehdi  Date Provider Notified  --  01/07/22  Time Provider Notified  --  [phone]  Notification Type  --  Page  Notification Reason  --  Other (Comment)  Document  Patient Outcome  --  Stabilized after interventions  Progress note created (see row info)  --  Yes

## 2022-01-07 NOTE — Assessment & Plan Note (Addendum)
Continue Crestor 

## 2022-01-07 NOTE — Discharge Instructions (Addendum)
-  1)Very low-salt diet advised--less than 2 gm 2)Weigh yourself daily, call if you gain more than 3 pounds in 1 day or more than 5 pounds in 1 week as your hemodialysis schedule may need to be adjusted 3)Limit your Fluid  intake to no more than 60 ounces (1.8 Liters) per day 4) continue hemodialysis on Tuesday Thursdays and Saturday  Information on my medicine - ELIQUIS (apixaban)  This medication education was reviewed with me or my healthcare representative as part of my discharge preparation.    Why was Eliquis prescribed for you? Eliquis was prescribed for you to reduce the risk of a blood clot forming that can cause a stroke if you have a medical condition called atrial fibrillation (a type of irregular heartbeat).  What do You need to know about Eliquis ? Take your Eliquis TWICE DAILY - one tablet in the morning and one tablet in the evening with or without food. If you have difficulty swallowing the tablet whole please discuss with your pharmacist how to take the medication safely.  Take Eliquis exactly as prescribed by your doctor and DO NOT stop taking Eliquis without talking to the doctor who prescribed the medication.  Stopping may increase your risk of developing a stroke.  Refill your prescription before you run out.  After discharge, you should have regular check-up appointments with your healthcare provider that is prescribing your Eliquis.  In the future your dose may need to be changed if your kidney function or weight changes by a significant amount or as you get older.  What do you do if you miss a dose? If you miss a dose, take it as soon as you remember on the same day and resume taking twice daily.  Do not take more than one dose of ELIQUIS at the same time to make up a missed dose.  Important Safety Information  A possible side effect of Eliquis is bleeding. You should call your healthcare provider right away if you experience any of the following: Bleeding  from an injury or your nose that does not stop. Unusual colored urine (red or dark brown) or unusual colored stools (red or black). Unusual bruising for unknown reasons. A serious fall or if you hit your head (even if there is no bleeding).  Some medicines may interact with Eliquis and might increase your risk of bleeding or clotting while on Eliquis. To help avoid this, consult your healthcare provider or pharmacist prior to using any new prescription or non-prescription medications, including herbals, vitamins, non-steroidal anti-inflammatory drugs (NSAIDs) and supplements.  This website has more information on Eliquis (apixaban): http://www.eliquis.com/eliquis/home  - 1)Very low-salt diet advised--less than 2 gm 2)Weigh yourself daily, call if you gain more than 3 pounds in 1 day or more than 5 pounds in 1 week as your hemodialysis schedule may need to be adjusted 3)Limit your Fluid  intake to no more than 60 ounces (1.8 Liters) per day 4) continue hemodialysis on Tuesday Thursdays and Saturday

## 2022-01-07 NOTE — ED Provider Notes (Addendum)
[facility] EMERGENCY DEPARTMENT Provider Note   CSN: [id number] Arrival date & time: 01/07/22  1009     History  Chief Complaint  Patient presents with   Shortness of Breath    [name] [name] is a 88 y.o. male.  Patient has a history of renal disease and is on dialysis for a week now.  He also has coronary artery disease congestive heart failure.  He presents with shortness of breath  The history is provided by the patient and medical records. No language interpreter was used.  Shortness of Breath Severity:  Moderate Onset quality:  Sudden Duration:  3 days Timing:  Constant Progression:  Worsening Chronicity:  Recurrent Context: activity   Relieved by:  Nothing Worsened by:  Nothing Ineffective treatments:  None tried Associated symptoms: no abdominal pain, no chest pain, no cough, no headaches and no rash       Home Medications Prior to Admission medications   Medication Sig Start Date End Date Taking? Authorizing Provider  acetaminophen (TYLENOL) 500 MG tablet Take 1,000 mg by mouth every 8 (eight) hours as needed for mild pain or moderate pain. For headache pain   Yes [provider]  albuterol (VENTOLIN HFA) 108 (90 Base) MCG/ACT inhaler Inhale into the lungs every 6 (six) hours as needed for wheezing or shortness of breath.   Yes [provider]  allopurinol (ZYLOPRIM) 100 MG tablet Take 100 mg by mouth daily.  10/07/18  Yes [provider]  ALPRAZolam Duanne Moron) 0.5 MG tablet Take 1 tablet (0.5 mg total) by mouth 2 (two) times daily as needed for anxiety. 06/06/20  Yes Emokpae, Courage, MD  amLODipine (NORVASC) 10 MG tablet Take 10 mg by mouth daily. 03/27/20  Yes [provider]  ARIPiprazole (ABILIFY) 20 MG tablet Take 1 tablet (20 mg total) by mouth daily. 06/06/20  Yes Emokpae, Courage, MD  carvedilol (COREG) 6.25 MG tablet Take 6.25 mg by mouth 2 (two) times daily. 05/02/21  Yes [provider]  epoetin alfa (EPOGEN) 3000  UNIT/ML injection Inject 3,000 Units into the skin every 14 (fourteen) days.    Yes [provider]  finasteride (PROSCAR) 5 MG tablet Take 1 tablet (5 mg total) by mouth daily. 06/01/21  Yes McKenzie, Candee Furbish, MD  fish oil-omega-3 fatty acids 1000 MG capsule Take 1 g by mouth daily.   Yes [provider]  furosemide (LASIX) 20 MG tablet Take 80 mg by mouth 2 (two) times daily. 12/28/21  Yes [provider]  lansoprazole (PREVACID) 15 MG capsule Take 1 capsule (15 mg total) by mouth 2 (two) times daily before a meal. 06/06/20  Yes Emokpae, Courage, MD  Multiple Vitamin (MULTIVITAMIN WITH MINERALS) TABS tablet Take 1 tablet by mouth daily.   Yes [provider]  ofloxacin (FLOXIN) 0.3 % OTIC solution Place 5 drops into both ears daily. 10/19/18  Yes [provider]  rosuvastatin (CRESTOR) 10 MG tablet Take 10 mg by mouth daily.   Yes [provider]  sertraline (ZOLOFT) 100 MG tablet Take 1 tablet (100 mg total) by mouth daily. 06/06/20  Yes Emokpae, Courage, MD  sevelamer carbonate (RENVELA) 800 MG tablet Take 800 mg by mouth 3 (three) times daily. 12/28/21  Yes [provider]  sodium bicarbonate 650 MG tablet Take 1 tablet (650 mg total) by mouth 3 (three) times daily. 06/06/20  Yes Roxan Hockey, MD  amoxicillin-clavulanate (AUGMENTIN) 875-125 MG tablet Take 1 tablet by mouth in the morning and at bedtime.  Patient not taking: Reported on 01/07/2022    [provider]  cefdinir (OMNICEF) 300 MG capsule Take 1 capsule (300 mg total) by mouth daily. Patient not taking: Reported on 12/27/2021 05/22/21   Orson Eva, MD  tamsulosin (FLOMAX) 0.4 MG CAPS capsule Take 1 capsule (0.4 mg total) by mouth daily after supper. Patient not taking: Reported on 12/27/2021 06/01/21   Cleon Gustin, MD  traMADol (ULTRAM) 50 MG tablet Take 1 tablet (50 mg total) by mouth every 6 (six) hours as needed. Patient not taking: Reported on 12/27/2021 05/15/20    Angelia Mould, MD      Allergies    Ivp dye [iodinated contrast media], Nalbuphine, and Codeine    Review of Systems   Review of Systems  Constitutional:  Negative for appetite change and fatigue.  HENT:  Negative for congestion, ear discharge and sinus pressure.   Eyes:  Negative for discharge.  Respiratory:  Positive for shortness of breath. Negative for cough.   Cardiovascular:  Negative for chest pain.  Gastrointestinal:  Negative for abdominal pain and diarrhea.  Genitourinary:  Negative for frequency and hematuria.  Musculoskeletal:  Negative for back pain.  Skin:  Negative for rash.  Neurological:  Negative for seizures and headaches.  Psychiatric/Behavioral:  Negative for hallucinations.    Physical Exam Updated Vital Signs BP (!) 127/51    Pulse 96    Temp 98.6 F (37 C) (Oral)    Resp (!) 22    Ht 5\' 8"  (1.727 m)    Wt 77.1 kg    SpO2 98%    BMI 25.85 kg/m  Physical Exam Vitals and nursing note reviewed.  Constitutional:      Appearance: He is well-developed.  HENT:     Head: Normocephalic.     Nose: Nose normal.  Eyes:     General: No scleral icterus.    Conjunctiva/sclera: Conjunctivae normal.  Neck:     Thyroid: No thyromegaly.  Cardiovascular:     Rate and Rhythm: Normal rate and regular rhythm.     Heart sounds: No murmur heard.   No friction rub. No gallop.  Pulmonary:     Breath sounds: No stridor. No wheezing or rales.  Chest:     Chest wall: No tenderness.  Abdominal:     General: There is no distension.     Tenderness: There is no abdominal tenderness. There is no rebound.  Musculoskeletal:        General: Normal range of motion.     Cervical back: Neck supple.  Lymphadenopathy:     Cervical: No cervical adenopathy.  Skin:    Findings: No erythema or rash.  Neurological:     Mental Status: He is alert and oriented to person, place, and time.     Motor: No abnormal muscle tone.     Coordination: Coordination normal.  Psychiatric:         Behavior: Behavior normal.    ED Results / Procedures / Treatments   Labs (all labs ordered are listed, but only abnormal results are displayed) Labs Reviewed  CBC WITH DIFFERENTIAL/PLATELET - Abnormal; Notable for the following components:      Result Value   RBC 2.17 (*)    Hemoglobin 7.0 (*)    HCT 22.8 (*)    MCV 105.1 (*)    Neutro Abs 8.4 (*)    Lymphs Abs 0.6 (*)    All other components within normal limits  COMPREHENSIVE METABOLIC PANEL - Abnormal;  Notable for the following components:   Sodium 134 (*)    Potassium 3.4 (*)    Chloride 92 (*)    Glucose, Bld 135 (*)    BUN 36 (*)    Creatinine, Ser 4.58 (*)    Calcium 8.5 (*)    Albumin 3.0 (*)    GFR, Estimated 12 (*)    All other components within normal limits  D-DIMER, QUANTITATIVE - Abnormal; Notable for the following components:   D-Dimer, Quant 3.78 (*)    All other components within normal limits  BRAIN NATRIURETIC PEPTIDE - Abnormal; Notable for the following components:   B Natriuretic Peptide 1,222.0 (*)    All other components within normal limits  RESP PANEL BY RT-PCR (FLU A&B, COVID) ARPGX2  BRAIN NATRIURETIC PEPTIDE  OCCULT BLOOD X 1 CARD TO LAB, STOOL  OCCULT BLOOD X 1 CARD TO LAB, STOOL  IRON AND TIBC  VITAMIN B12  TROPONIN I (HIGH SENSITIVITY)  TROPONIN I (HIGH SENSITIVITY)    EKG EKG Interpretation  Date/Time:  Monday January 07 2022 10:46:06 EST Ventricular Rate:  97 PR Interval:    QRS Duration: 102 QT Interval:  384 QTC Calculation: 487 R Axis:   50 Text Interpretation: Atrial fibrillation Cannot rule out Anterior infarct , age undetermined ST & T wave abnormality, consider inferolateral ischemia Abnormal ECG When compared with ECG of 27-Dec-2021 11:04, PREVIOUS ECG IS PRESENT Confirmed by Milton Ferguson [phone]) on 01/07/2022 12:36:20 PM  Radiology DG Chest Port 1 View  Result Date: 01/07/2022 CLINICAL DATA:  Shortness of breath. EXAM: PORTABLE CHEST 1 VIEW COMPARISON:   December 27, 2021. FINDINGS: Stable cardiomegaly. Small bilateral pleural effusions are noted with associated bibasilar atelectasis or edema. Old left clavicular fracture is noted. IMPRESSION: Small bilateral pleural effusions with associated bibasilar atelectasis or edema. Electronically Signed   By: Marijo Conception M.D.   On: 01/07/2022 11:57    Procedures Procedures    Medications Ordered in ED Medications  heparin injection 5,000 Units (has no administration in time range)  sodium chloride flush (NS) 0.9 % injection 3 mL (has no administration in time range)  sodium chloride flush (NS) 0.9 % injection 3 mL (has no administration in time range)  sodium chloride flush (NS) 0.9 % injection 3 mL (has no administration in time range)  0.9 %  sodium chloride infusion (has no administration in time range)  acetaminophen (TYLENOL) tablet 650 mg (has no administration in time range)    Or  acetaminophen (TYLENOL) suppository 650 mg (has no administration in time range)  oxyCODONE (Oxy IR/ROXICODONE) immediate release tablet 5 mg (has no administration in time range)  HYDROmorphone (DILAUDID) injection 0.5-1 mg (has no administration in time range)  traZODone (DESYREL) tablet 25 mg (has no administration in time range)  senna-docusate (Senokot-S) tablet 1 tablet (has no administration in time range)  bisacodyl (DULCOLAX) EC tablet 5 mg (has no administration in time range)  ondansetron (ZOFRAN) tablet 4 mg (has no administration in time range)    Or  ondansetron (ZOFRAN) injection 4 mg (has no administration in time range)  ipratropium (ATROVENT) nebulizer solution 0.5 mg (has no administration in time range)  levalbuterol (XOPENEX) nebulizer solution 0.63 mg (has no administration in time range)  hydrALAZINE (APRESOLINE) injection 10 mg (has no administration in time range)  furosemide (LASIX) 120 mg in dextrose 5 % 50 mL IVPB (has no administration in time range)  furosemide (LASIX) injection  40 mg (40 mg Intravenous Given 01/07/22 1334)  CRITICAL CARE Performed by: Milton Ferguson Total critical care time: 40 minutes Critical care time was exclusive of separately billable procedures and treating other patients. Critical care was necessary to treat or prevent imminent or life-threatening deterioration. Critical care was time spent personally by me on the following activities: development of treatment plan with patient and/or surrogate as well as nursing, discussions with consultants, evaluation of patient's response to treatment, examination of patient, obtaining history from patient or surrogate, ordering and performing treatments and interventions, ordering and review of laboratory studies, ordering and review of radiographic studies, pulse oximetry and re-evaluation of patient's condition.  ED Course/ Medical Decision Making/ A&P                           Medical Decision Making Amount and/or Complexity of Data Reviewed Labs: ordered. Radiology: ordered.  Risk Decision regarding hospitalization.   Patient with shortness of breath and new onset of atrial fib.  He also has congestive heart failure and will get dialysis to help with that.  Patient will get a VQ scan and is going to be admitted to medicine with nephrology consult   This patient presents to the ED for concern of shortness of breath, this involves an extensive number of treatment options, and is a complaint that carries with it a high risk of complications and morbidity.  The differential diagnosis includes congestive heart failure, pneumonia, PE   Co morbidities that complicate the patient evaluation  Renal disease, coronary artery disease   Additional history obtained:  Additional history obtained from caregiver External records from outside source obtained and reviewed including hospital records   Lab Tests:  I Ordered, and personally interpreted labs.  The pertinent results include: CBC chemistries  troponin, BNP.  Patient is anemic at 7.0 along with renal failure and significant elevation of his BNP along with a D-dimer elevation   Imaging Studies ordered:  I ordered imaging studies including chest x-ray shows pleural effusion I independently visualized and interpreted imaging which showed pleural effusions and congestive heart failure I agree with the radiologist interpretation   Cardiac Monitoring:  The patient was maintained on a cardiac monitor.  I personally viewed and interpreted the cardiac monitored which showed an underlying rhythm of: Atrial for   Medicines ordered and prescription drug management:  I ordered medication including Lasix for heart failure Reevaluation of the patient after these medicines showed that the patient stayed the same I have reviewed the patients home medicines and have made adjustments as needed   Test Considered:  CT chest   Critical Interventions:  Lasix for heart failure and dialysis   Consultations Obtained:  I requested consultation with the nephrology,  and discussed lab and imaging findings as well as pertinent plan - they recommend: We will see patient and possibly do dialysis   Problem List / ED Course:  Coronary artery disease, renal failure   Reevaluation:  After the interventions noted above, I reevaluated the patient and found that they have :improved   Social Determinants of Health:  Dialysis patient   Dispostion:  After consideration of the diagnostic results and the patients response to treatment, I feel that the patent would benefit from admission to the hospital and diuresed with nephrology consult.          Final Clinical Impression(s) / ED Diagnoses Final diagnoses:  Dyspnea, unspecified type    Rx / DC Orders ED Discharge Orders     None  Milton Ferguson, MD 01/08/22 2301    Milton Ferguson, MD 01/08/22 [phone]

## 2022-01-07 NOTE — Assessment & Plan Note (Addendum)
-   Likely due to volume overload -Obvious lower extremity pitting edema, -No hypoxia, continue to use hemodialysis to address volume  overload status and dyspnea -= Overall much improved

## 2022-01-07 NOTE — Assessment & Plan Note (Addendum)
-   In remission to follow-up as an outpatient

## 2022-01-07 NOTE — ED Provider Notes (Signed)
I spoke to nephrology who will come see the patient.  They recommended giving Lasix 160 mg IV.  He is already gotten 40 mg IV so we will give him an additional 120 mg IV.     I did a rectal on the patient that was heme-negative.   Milton Ferguson, MD 01/07/22 1339

## 2022-01-07 NOTE — Progress Notes (Signed)
Awaiting new orders   01/07/22 1732  Assess: MEWS Score  Temp 98.4 F (36.9 C)  BP (!) 146/105  Pulse Rate (!) 130  Resp 20  SpO2 93 %  O2 Device Room Air  Assess: MEWS Score  MEWS Temp 0  MEWS Systolic 0  MEWS Pulse 3  MEWS RR 0  MEWS LOC 0  MEWS Score 3  MEWS Score Color Yellow  Assess: if the MEWS score is Yellow or Red  Were vital signs taken at a resting state? Yes  Focused Assessment No change from prior assessment  Early Detection of Sepsis Score *See Row Information* Medium  MEWS guidelines implemented *See Row Information* Yes  Treat  MEWS Interventions Escalated (See documentation below)  Pain Scale 0-10  Pain Score 0  Take Vital Signs  Increase Vital Sign Frequency  Yellow: Q 2hr X 2 then Q 4hr X 2, if remains yellow, continue Q 4hrs  Escalate  MEWS: Escalate Yellow: discuss with charge nurse/RN and consider discussing with provider and RRT  Notify: Charge Nurse/RN  Name of Charge Nurse/RN Notified Apolonio Schneiders, RN  Date Charge Nurse/RN Notified 01/07/22  Time Charge Nurse/RN Notified 1739  Notify: Provider  Provider Name/Title Shahmedhi  Date Provider Notified 01/07/22  Time Provider Notified 1739  Notification Type Page  Notification Reason Change in status

## 2022-01-07 NOTE — Assessment & Plan Note (Addendum)
-  Per patient started on hemodialysis 1 week PTA -Status post 3 session of hemodialysis, last on Saturday PTA -Prior long history of kidney failure  - Patient has a Lt arm AV graft > 2 years -Nephrology input appreciated okay to continue outpatient hemodialysis on 01/10/22

## 2022-01-07 NOTE — Assessment & Plan Note (Addendum)
Chest pain-free -Coreg discontinued due to soft BP, will use metoprolol 25 mg twice daily for rate control, continue Crestor

## 2022-01-08 DIAGNOSIS — L8990 Pressure ulcer of unspecified site, unspecified stage: Secondary | ICD-10-CM | POA: Diagnosis present

## 2022-01-08 DIAGNOSIS — R0602 Shortness of breath: Secondary | ICD-10-CM | POA: Diagnosis not present

## 2022-01-08 LAB — BPAM RBC
Blood Product Expiration Date: 202303132359
Blood Product Expiration Date: 202303132359
ISSUE DATE / TIME: 202302061806
ISSUE DATE / TIME: 202302062354
Unit Type and Rh: 5100
Unit Type and Rh: 5100

## 2022-01-08 LAB — TYPE AND SCREEN
ABO/RH(D): O POS
Antibody Screen: NEGATIVE
Unit division: 0
Unit division: 0

## 2022-01-08 LAB — CBC
HCT: 30.8 % — ABNORMAL LOW (ref 39.0–52.0)
Hemoglobin: 10.3 g/dL — ABNORMAL LOW (ref 13.0–17.0)
MCH: 32.2 pg (ref 26.0–34.0)
MCHC: 33.4 g/dL (ref 30.0–36.0)
MCV: 96.3 fL (ref 80.0–100.0)
Platelets: 160 10*3/uL (ref 150–400)
RBC: 3.2 MIL/uL — ABNORMAL LOW (ref 4.22–5.81)
RDW: 15.8 % — ABNORMAL HIGH (ref 11.5–15.5)
WBC: 8.3 10*3/uL (ref 4.0–10.5)
nRBC: 0 % (ref 0.0–0.2)

## 2022-01-08 LAB — PROTIME-INR
INR: 1.5 — ABNORMAL HIGH (ref 0.8–1.2)
Prothrombin Time: 17.6 seconds — ABNORMAL HIGH (ref 11.4–15.2)

## 2022-01-08 LAB — BASIC METABOLIC PANEL
Anion gap: 11 (ref 5–15)
BUN: 41 mg/dL — ABNORMAL HIGH (ref 8–23)
CO2: 27 mmol/L (ref 22–32)
Calcium: 8.4 mg/dL — ABNORMAL LOW (ref 8.9–10.3)
Chloride: 95 mmol/L — ABNORMAL LOW (ref 98–111)
Creatinine, Ser: 4.76 mg/dL — ABNORMAL HIGH (ref 0.61–1.24)
GFR, Estimated: 11 mL/min — ABNORMAL LOW (ref >60)
Glucose, Bld: 103 mg/dL — ABNORMAL HIGH (ref 70–99)
Potassium: 3.3 mmol/L — ABNORMAL LOW (ref 3.5–5.1)
Sodium: 133 mmol/L — ABNORMAL LOW (ref 135–145)

## 2022-01-08 LAB — GLUCOSE, CAPILLARY: Glucose-Capillary: 106 mg/dL — ABNORMAL HIGH (ref 70–99)

## 2022-01-08 MED ORDER — SODIUM CHLORIDE 0.9 % IV SOLN: 100.0000 mL | INTRAVENOUS | Status: DC | PRN

## 2022-01-08 MED ORDER — ALPRAZOLAM 0.5 MG PO TABS
0.5000 mg | ORAL_TABLET | Freq: Once | ORAL | Status: AC
  Administered 2022-01-08: 0.5 mg via ORAL
  Filled 2022-01-08: qty 1

## 2022-01-08 MED ORDER — PENTAFLUOROPROP-TETRAFLUOROETH EX AERO: 1.0000 "application " | INHALATION_SPRAY | CUTANEOUS | Status: DC | PRN

## 2022-01-08 MED ORDER — ALBUMIN HUMAN 25 % IV SOLN: 25.0000 g | INTRAVENOUS | Status: DC | PRN

## 2022-01-08 MED ORDER — LIDOCAINE HCL (PF) 1 % IJ SOLN: 5.0000 mL | INTRAMUSCULAR | Status: DC | PRN

## 2022-01-08 MED ORDER — LIDOCAINE-PRILOCAINE 2.5-2.5 % EX CREA: 1.0000 "application " | TOPICAL_CREAM | CUTANEOUS | Status: DC | PRN

## 2022-01-08 MED ORDER — DARBEPOETIN ALFA 200 MCG/0.4ML IJ SOSY
PREFILLED_SYRINGE | INTRAMUSCULAR | Status: AC
  Administered 2022-01-08: 200 ug via INTRAVENOUS
  Filled 2022-01-08: qty 0.4

## 2022-01-08 NOTE — TOC Initial Note (Signed)
Transition of Care [facility]) - Initial/Assessment Note    Patient Details  Name: [name] [name] MRN: [id number] Date of Birth: [birth year]  [facility]) CM/SW Contact:    Boneta Lucks, RN Phone Number: 01/08/2022, 1:58 PM  Clinical Narrative:        Patient admitted with shortness of breath. Patient has a high risk for readmission. Patient in dialysis here today. Caregiver at the bedside providing history. Patient gets Dialysis at [facility] on [address]  He has three private paid sitters. They work 8Am- 8pm daily. They take him to appointments as needed. He has a life alert to use at night. He walks with a walker. He has not had home health in a while and they are requesting [facility] back out. MD updated to order. Vaughan Basta accepted the referral.            Expected Discharge Plan: [place] Barriers to Discharge: Continued Medical Work up  Patient Goals and CMS Choice Patient states their goals for this hospitalization and ongoing recovery are:: to go home. CMS Medicare.gov Compare Post Acute Care list provided to:: Patient Represenative (must comment)    Expected Discharge Plan and Services Expected Discharge Plan: [place]     Living arrangements for the past 2 months: Single Family Home     HH Arranged: RN, PT [facility] Agency: [place] ([facility]) Date [place]: 01/08/22 Time [place]: 68 Representative spoke with at [place]: [place] Arrangements/Services Living arrangements for the past 2 months: [place] Lives with:: Self   Do you feel safe going back to the place where you live?: Yes          Current home services: [place]   Activities of Daily Living Home Assistive Devices/Equipment: Wheelchair, Environmental consultant (specify type), Oxygen ADL Screening (condition at time of admission) Patient's cognitive ability adequate to safely complete daily activities?: Yes Is the  patient deaf or have difficulty hearing?: No Does the patient have difficulty seeing, even when wearing glasses/contacts?: No Does the patient have difficulty concentrating, remembering, or making decisions?: No Patient able to express need for assistance with ADLs?: Yes Does the patient have difficulty dressing or bathing?: Yes Independently performs ADLs?: No Communication: Independent Dressing (OT): Needs assistance Is this a change from baseline?: Pre-admission baseline Grooming: Independent Feeding: Independent Bathing: Needs assistance Is this a change from baseline?: Pre-admission baseline Toileting: Needs assistance Is this a change from baseline?: Pre-admission baseline In/Out Bed: Needs assistance Is this a change from baseline?: Pre-admission baseline Walks in Home: Independent with device (comment) Does the patient have difficulty walking or climbing stairs?: Yes Weakness of Legs: Both Weakness of Arms/Hands: Both  Permission Sought/Granted      Emotional Assessment    Orientation: : Oriented to Self, Oriented to Place, Oriented to  Time, Oriented to Situation    Admission diagnosis:  SOB (shortness of breath) [R06.02] Dyspnea, unspecified type [R06.00] Patient Active Problem List   Diagnosis Date Noted   Pressure injury of skin 01/08/2022   SOB (shortness of breath) 01/07/2022   ESRD (end stage renal disease) ([place]) 21/30/8657   Chronic systolic CHF (congestive heart failure) ([place]) 01/07/2022   Acute cystitis with hematuria    Hyponatremia 05/20/2021   Urinary retention    Fracture of third lumbar vertebra ([place]) 02/05/2021   Closed nondisplaced fracture of shaft of fifth metacarpal bone of right hand 01/31/2021   Dislocation of proximal  interphalangeal joint of finger 01/31/2021   Anemia in chronic kidney disease 10/21/2019   Syncope 09/07/2018   Acute lower UTI 09/07/2018   Gross hematuria 07/25/2018   Paroxysmal A-fib ([place]) 07/19/2018   Chronic kidney  disease, stage IV (severe) ([place]) 07/19/2018   Hyperlipidemia 08/05/2014   Knee effusion, right 04/21/2012   Male erectile dysfunction 12/17/2011   Essential hypertension 07/25/2011   Personal history of prostate cancer 08/27/2010   CAD (coronary artery disease) 06/14/2010   Carotid artery occlusion 06/14/2010   CLOSED FRACTURE OF ACROMIAL END OF CLAVICLE 04/25/2010   PCP:  [name], MD Pharmacy:   [place], [address] [place] [address] Phone: [phone] Fax: [phone]   Readmission Risk Interventions Readmission Risk Prevention Plan 01/08/2022 06/06/2020  Transportation Screening Complete Complete  PCP or Specialist Appt within 3-5 Days - Complete  HRI or [place] - Complete  Social Work Consult for [place] Planning/Counseling - Complete  Palliative Care Screening - Not Applicable  Medication Review Press photographer) Complete Complete  HRI or Home Care Consult Complete -  SW Recovery Care/Counseling Consult Complete -  Palliative Care Screening Not Applicable -  [place] Not Applicable -  Some recent data might be hidden

## 2022-01-08 NOTE — Assessment & Plan Note (Signed)
-   Wound care as advised

## 2022-01-08 NOTE — Progress Notes (Signed)
Subjective:  HR is down this AM-  getting blood-  plan is for HD later today-  no UOP recorded .  He remains flat-  says he is no better    Objective Vital signs in last 24 hours: Vitals:   01/07/22 2300 01/07/22 2330 01/08/22 0015 01/08/22 0245  BP: (!) 109/59 (!) 109/59 (!) 109/57 116/80  Pulse: 89 89 84 68  Resp: 18 18 17 18   Temp: (!) 97.5 F (36.4 C) (!) 97.5 F (36.4 C) 98.6 F (37 C) 98.7 F (37.1 C)  TempSrc: Oral Oral Oral Oral  SpO2: 92% 93% 93% 93%  Weight:      Height:       Weight change:   Intake/Output Summary (Last 24 hours) at 01/08/2022 0747 Last data filed at 01/08/2022 0500 Gross per 24 hour  Intake 1067.9 ml  Output --  Net 1067.9 ml   HD [facility]-  TTS- no other details known-  has only been on HD for a week   Assessment/Plan: 88 year old WM with new ESRD-  has been on HD for a week-  now presents with DOE-  new onset Afib and worsening anemia 1.Renal- new start to dialysis -  has only been on for a week -  going OK according to him.  His BUN, electrolytes yesterday were reasonable-  would not expect would be giving him symptoms.  Plan for routine HD today via AVG 2. Hypertension/volume  - CXR shows bilat effusions and edema-  he also has a little peripheral edema-  will plan to UF as able with HD-  home med list indicates amlodipine and coreg-  would hold at least amlodipine right now so that we will have BP cushion to increase UF 3.  SOB/DOE-  most likely a combo of volume overload, new onset Afib and anemia.  I gave him high dose lasix and does not appear that he put out-  will stop 4. Afib-  rate around 100-  to be started on eliquis and stay on beta blocker-  HR better this AM 5. Anemia  - not unusual for ESRD patient to have anemia-  to be given blood with HD-  will also give IV iron and ESA.  Hgb up to over 10 this AM ?  6. Bones-   calc and phos WNL-  no bone active meds for now    Rossiter: Basic Metabolic  Panel: Recent Labs  Lab 01/07/22 1118 01/08/22 0555  NA 134* 133*  K 3.4* 3.3*  CL 92* 95*  CO2 29 27  GLUCOSE 135* 103*  BUN 36* 41*  CREATININE 4.58* 4.76*  CALCIUM 8.5* 8.4*   Liver Function Tests: Recent Labs  Lab 01/07/22 1118  AST 22  ALT 15  ALKPHOS 70  BILITOT 0.3  PROT 7.4  ALBUMIN 3.0*   No results for input(s): LIPASE, AMYLASE in the last 168 hours. No results for input(s): AMMONIA in the last 168 hours. CBC: Recent Labs  Lab 01/07/22 1118 01/07/22 1612 01/08/22 0555  WBC 9.8  --  8.3  NEUTROABS 8.4*  --   --   HGB 7.0* 7.4* 10.3*  HCT 22.8* 23.6* 30.8*  MCV 105.1*  --  96.3  PLT 191  --  160   Cardiac Enzymes: No results for input(s): CKTOTAL, CKMB, CKMBINDEX, TROPONINI in the last 168 hours. CBG: No results for input(s): GLUCAP in the last 168 hours.  Iron Studies:  Recent Labs  01/07/22 1333  IRON 28*  TIBC 209*   Studies/Results: NM Pulmonary Perfusion  Result Date: 01/07/2022 CLINICAL DATA:  Chest pain for several days EXAM: NUCLEAR MEDICINE PERFUSION LUNG SCAN TECHNIQUE: Perfusion images were obtained in multiple projections after intravenous injection of radiopharmaceutical. Ventilation scans intentionally deferred if perfusion scan and chest x-ray adequate for interpretation during COVID 19 epidemic. RADIOPHARMACEUTICALS:  4.1 mCi Tc-56m MAA IV COMPARISON:  01/07/2022 FINDINGS: Planar images of the chest are obtained during the perfusion phase of the exam. There are no wedge-shaped perfusion defects to suggest pulmonary embolus. Decreased radiotracer uptake within the left lung base consistent with cardiomegaly and left basilar consolidation and/or effusions seen on corresponding chest x-ray. IMPRESSION: 1. No evidence of acute pulmonary embolus. Electronically Signed   By: Randa Ngo M.D.   On: 01/07/2022 15:27   DG Chest Port 1 View  Result Date: 01/07/2022 CLINICAL DATA:  Shortness of breath. EXAM: PORTABLE CHEST 1 VIEW COMPARISON:   December 27, 2021. FINDINGS: Stable cardiomegaly. Small bilateral pleural effusions are noted with associated bibasilar atelectasis or edema. Old left clavicular fracture is noted. IMPRESSION: Small bilateral pleural effusions with associated bibasilar atelectasis or edema. Electronically Signed   By: Marijo Conception M.D.   On: 01/07/2022 11:57   Medications: Infusions:  sodium chloride     ferric gluconate (FERRLECIT) IVPB 250 mg (01/08/22 0351)    Scheduled Medications:  sodium chloride   Intravenous Once   apixaban  2.5 mg Oral BID   Chlorhexidine Gluconate Cloth  6 each Topical Q0600   darbepoetin (ARANESP) injection - DIALYSIS  200 mcg Intravenous Q Tue-HD   metoprolol tartrate  100 mg Oral BID   sodium chloride flush  3 mL Intravenous Q12H   sodium chloride flush  3 mL Intravenous Q12H    have reviewed scheduled and prn medications.  Physical Exam: General:  remains flat-  says is no better Heart: irreg-  rate is down Lungs: dec BS at bases Abdomen: soft, non tender Extremities: pitting edema Dialysis Access: left AVG-  patent     01/08/2022,7:47 AM  LOS: 1 day

## 2022-01-08 NOTE — Evaluation (Signed)
Physical Therapy Evaluation Patient Details Name: [name] [name] MRN: [id number] DOB: 1934/02/27 Today's Date: 01/08/2022  History of Present Illness  [name] [name] is a 88 year old male with history of ESRD -recently started on hemodialysis( TTS), HTN, HLD, GERD, Systolic CHF, Depression, h/o hematuria, Chronic anemia, Chronic hyponatremia,  Multiple hospitalizations at [facility].  Multiple ED visits.AV graft Rt arm,  He has received EPO all injection as an outpatient.  Presented today with chief complaint of shortness of breath.  Progressively worsened after hemodialysis on Saturday.  Denies any chest pain, weight gain, weight loss with hemodialysis 0.5 lbs   Clinical Impression  Patient demonstrates slow labored movement for sitting up at bedside, labored movement for completing sit to stands, good return for ambulation to doorway and back without loss of balance, limited mostly due to fatigue and tolerated sitting up in chair after therapy.  Patient will benefit from continued skilled physical therapy in hospital and recommended venue below to increase strength, balance, endurance for safe ADLs and gait.         Recommendations for follow up therapy are one component of a multi-disciplinary discharge planning process, led by the attending physician.  Recommendations may be updated based on patient status, additional functional criteria and insurance authorization.  Follow Up Recommendations Home health PT    Assistance Recommended at Discharge Intermittent Supervision/Assistance  Patient can return home with the following  A little help with walking and/or transfers;A little help with bathing/dressing/bathroom;Help with stairs or ramp for entrance;Assistance with cooking/housework    Equipment Recommendations None recommended by PT  Recommendations for Other Services       Functional Status Assessment Patient has had a recent decline in their functional status and demonstrates the ability  to make significant improvements in function in a reasonable and predictable amount of time.     Precautions / Restrictions Precautions Precautions: Fall Restrictions Weight Bearing Restrictions: No      Mobility  Bed Mobility               General bed mobility comments: as per OT notes    Transfers                   General transfer comment: as per OT notes    Ambulation/Gait Ambulation/Gait assistance: Min assist, Min guard Gait Distance (Feet): 25 Feet Assistive device: Rolling walker (2 wheels) Gait Pattern/deviations: Decreased step length - left, Decreased stance time - right, Decreased stride length Gait velocity: decreased     General Gait Details: slightly labored cadence without loss of balance, limited mostly due to fatigue, on room air with SpO2 above 90%  Stairs            Wheelchair Mobility    Modified Rankin (Stroke Patients Only)       Balance Overall balance assessment: Needs assistance Sitting-balance support: Feet supported, No upper extremity supported Sitting balance-Leahy Scale: Good Sitting balance - Comments: seated EOB   Standing balance support: Bilateral upper extremity supported, Reliant on assistive device for balance, During functional activity Standing balance-Leahy Scale: Fair Standing balance comment: using RW                             Pertinent Vitals/Pain Pain Assessment Pain Assessment: No/denies pain    Home Living Family/patient expects to be discharged to:: Private residence Living Arrangements: Non-relatives/Friends Available Help at Discharge: Personal care attendant;Available PRN/intermittently Type of Home: House Home Access: Stairs to  enter Entrance Stairs-Rails: None Entrance Stairs-Number of Steps: 1   Home Layout: Two level;Able to live on main level with bedroom/bathroom Home Equipment: Rolling Walker (2 wheels);Wheelchair - manual;Cane - single point Additional  Comments: Pt has care attendants present 7 days a week from 8 AM to 8 PM.    Prior Function Prior Level of Function : Needs assist       Physical Assist : Mobility (physical);ADLs (physical) Mobility (physical): Transfers;Gait;Stairs;Bed mobility   Mobility Comments: household ambulator using RW ADLs Comments: Pt reports need for assist for dressing, bathing, and IADL's from care attendant staff. Pt in independent for grooming and toileting, home aides 8 am to 8 pm x 7 days/week     Hand Dominance   Dominant Hand: Right    Extremity/Trunk Assessment   Upper Extremity Assessment Upper Extremity Assessment: Defer to OT evaluation    Lower Extremity Assessment Lower Extremity Assessment: Generalized weakness    Cervical / Trunk Assessment Cervical / Trunk Assessment: Kyphotic  Communication   Communication: No difficulties  Cognition Arousal/Alertness: Awake/alert Behavior During Therapy: WFL for tasks assessed/performed Overall Cognitive Status: Within Functional Limits for tasks assessed                                          General Comments      Exercises     Assessment/Plan    PT Assessment Patient needs continued PT services  PT Problem List Decreased strength;Decreased activity tolerance;Decreased balance;Decreased mobility       PT Treatment Interventions DME instruction;Gait training;Stair training;Functional mobility training;Therapeutic activities;Therapeutic exercise;Balance training;Patient/family education    PT Goals (Current goals can be found in the Care Plan section)  Acute Rehab PT Goals Patient Stated Goal: return home with home aides to assist PT Goal Formulation: With patient Time For Goal Achievement: 01/15/22 Potential to Achieve Goals: Good    Frequency Min 3X/week     Co-evaluation PT/OT/SLP Co-Evaluation/Treatment: Yes Reason for Co-Treatment: To address functional/ADL transfers PT goals addressed during  session: Mobility/safety with mobility;Balance;Proper use of DME         AM-PAC PT "6 Clicks" Mobility  Outcome Measure Help needed turning from your back to your side while in a flat bed without using bedrails?: A Little Help needed moving from lying on your back to sitting on the side of a flat bed without using bedrails?: A Little Help needed moving to and from a bed to a chair (including a wheelchair)?: A Little Help needed standing up from a chair using your arms (e.g., wheelchair or bedside chair)?: A Little Help needed to walk in hospital room?: A Little Help needed climbing 3-5 steps with a railing? : A Lot 6 Click Score: 17    End of Session   Activity Tolerance: Patient tolerated treatment well;Patient limited by fatigue Patient left: in chair;with call bell/phone within reach Nurse Communication: Mobility status PT Visit Diagnosis: Unsteadiness on feet (R26.81);Other abnormalities of gait and mobility (R26.89);Muscle weakness (generalized) (M62.81)    Time: 8786-7672 PT Time Calculation (min) (ACUTE ONLY): 15 min   Charges:   PT Evaluation $PT Eval Low Complexity: 1 Low PT Treatments $Therapeutic Activity: 8-22 mins        4:17 PM, 01/08/22 Lonell Grandchild, MPT Physical Therapist with [facility] [phone] office [phone] mobile phone

## 2022-01-08 NOTE — Procedures (Signed)
° °  HEMODIALYSIS TREATMENT NOTE:   Uneventful session completed using left forearm loop AVG (retrograde flow / 15g).  Goal met: 2.4 liters removed without interruption in UF. Rate controlled Afib with stable BPs - no albumin required.  All blood was returned and hemostasis was achieved in 15 minutes.   Rockwell Alexandria, RN

## 2022-01-08 NOTE — Plan of Care (Signed)
°  Problem: Acute Rehab OT Goals (only OT should resolve) Goal: Pt. Will Perform Eating Flowsheets (Taken 01/08/2022 1005) Pt Will Perform Eating:  with modified independence  sitting Goal: Pt. Will Perform Grooming Flowsheets (Taken 01/08/2022 1005) Pt Will Perform Grooming:  with modified independence  standing Goal: Pt. Will Perform Lower Body Bathing Flowsheets (Taken 01/08/2022 1005) Pt Will Perform Lower Body Bathing:  with min assist  sitting/lateral leans  with adaptive equipment Goal: Pt. Will Perform Lower Body Dressing Flowsheets (Taken 01/08/2022 1005) Pt Will Perform Lower Body Dressing:  with min assist  sitting/lateral leans  with adaptive equipment Goal: Pt. Will Transfer To Toilet Flowsheets (Taken 01/08/2022 1005) Pt Will Transfer to Toilet:  with modified independence  ambulating Goal: Pt/Caregiver Will Perform Home Exercise Program Flowsheets (Taken 01/08/2022 1005) Pt/caregiver will Perform Home Exercise Program:  Increased strength  Both right and left upper extremity  Independently  Kenyetta Fife OT, MOT

## 2022-01-08 NOTE — Plan of Care (Signed)
°  Problem: Acute Rehab PT Goals(only PT should resolve) Goal: Pt Will Go Supine/Side To Sit Outcome: Progressing Flowsheets (Taken 01/08/2022 1618) Pt will go Supine/Side to Sit:  with supervision  with min guard assist Goal: Patient Will Transfer Sit To/From Stand Outcome: Progressing Flowsheets (Taken 01/08/2022 1618) Patient will transfer sit to/from stand:  with min guard assist  with minimal assist Goal: Pt Will Transfer Bed To Chair/Chair To Bed Outcome: Progressing Flowsheets (Taken 01/08/2022 1618) Pt will Transfer Bed to Chair/Chair to Bed:  min guard assist  with min assist Goal: Pt Will Ambulate Outcome: Progressing Flowsheets (Taken 01/08/2022 1618) Pt will Ambulate:  50 feet  with supervision  with min guard assist   4:18 PM, 01/08/22 Lonell Grandchild, MPT Physical Therapist with [facility] [phone] office [phone] mobile phone

## 2022-01-08 NOTE — Evaluation (Signed)
Occupational Therapy Evaluation Patient Details Name: [name] [name] MRN: [id number] DOB: 1934-03-08 Today's Date: 01/08/2022   History of Present Illness [name] [name] is a 88 year old male with history of ESRD -recently started on hemodialysis( TTS), HTN, HLD, GERD, Systolic CHF, Depression, h/o hematuria, Chronic anemia, Chronic hyponatremia,  Multiple hospitalizations at [facility].  Multiple ED visits.AV graft Rt arm,  He has received EPO all injection as an outpatient.  Presented today with chief complaint of shortness of breath.  Progressively worsened after hemodialysis on Saturday.  Denies any chest pain, weight gain, weight loss with hemodialysis 0.5 lbs   Clinical Impression   Pt agreeable to OT and PT co-evaluation. Pt reports having assist for bathing and dressing at baseline with need for assist. This date pt required min A for sit to stand from chair with more supervision to min G assist for sit to stand from slightly higher bed surface. Pt was able to ambulate in room with supervision to min G assist. Pt generally weak but appears to have sufficient assist at home for ADL's per home care attendants 7 days a week for 12 hours a day. Pt will benefit from continued OT in the hospital and recommended venue below to increase strength, balance, and endurance for safe ADL's.        Recommendations for follow up therapy are one component of a multi-disciplinary discharge planning process, led by the attending physician.  Recommendations may be updated based on patient status, additional functional criteria and insurance authorization.   Follow Up Recommendations  Home health OT    Assistance Recommended at Discharge Intermittent Supervision/Assistance  Patient can return home with the following A little help with walking and/or transfers;A lot of help with bathing/dressing/bathroom;Help with stairs or ramp for entrance;Assist for transportation;Assistance with cooking/housework;Assistance  with feeding    Functional Status Assessment  Patient has had a recent decline in their functional status and demonstrates the ability to make significant improvements in function in a reasonable and predictable amount of time.  Equipment Recommendations  BSC/3in1           Precautions / Restrictions Precautions Precautions: Fall Restrictions Weight Bearing Restrictions: No      Mobility Bed Mobility Overal bed mobility: Needs Assistance Bed Mobility: Supine to Sit     Supine to sit: Modified independent (Device/Increase time)     General bed mobility comments: labored movement with extended time    Transfers Overall transfer level: Needs assistance Equipment used: Rolling walker (2 wheels) Transfers: Sit to/from Stand, Bed to chair/wheelchair/BSC Sit to Stand: Min guard, Min assist     Step pivot transfers: Min guard, Supervision     General transfer comment: Pt primarily only needing physical assist for sit to stand from lower surface like the chair this date.      Balance Overall balance assessment: Needs assistance Sitting-balance support: No upper extremity supported, Feet supported Sitting balance-Leahy Scale: Good Sitting balance - Comments: seated EOB   Standing balance support: Bilateral upper extremity supported, Reliant on assistive device for balance, During functional activity Standing balance-Leahy Scale: Fair Standing balance comment: using RW                           ADL either performed or assessed with clinical judgement   ADL Overall ADL's : Needs assistance/impaired Eating/Feeding: Set up;Sitting   Grooming: Supervision/safety;Standing       Lower Body Bathing: Maximal assistance;Sitting/lateral leans   Upper Body Dressing :  Sitting;Modified independent   Lower Body Dressing: Maximal assistance;Sitting/lateral leans;Total assistance   Toilet Transfer: Minimal assistance;Min guard Toilet Transfer Details (indicate  cue type and reason): simulated via EOB to chair transfer and sit to stand from chair Toileting- Clothing Manipulation and Hygiene: Supervision/safety;Sitting/lateral lean;Modified independent       Functional mobility during ADLs: Min guard;Minimal assistance General ADL Comments: Pt demosntrated mostly supervision to min G assist for mobility except for need for min A to stand from chair. Pt reports he is assisted for dressing and bathing at baseline.     Vision Baseline Vision/History: 1 Wears glasses (History of cataracts removal) Ability to See in Adequate Light: 1 Impaired Patient Visual Report: No change from baseline Vision Assessment?: No apparent visual deficits                Pertinent Vitals/Pain Pain Assessment Pain Assessment: No/denies pain     Hand Dominance Right   Extremity/Trunk Assessment Upper Extremity Assessment Upper Extremity Assessment: Generalized weakness (Deficits in fine motor strength noted via need for set up assist for feeding.)   Lower Extremity Assessment Lower Extremity Assessment: Defer to PT evaluation   Cervical / Trunk Assessment Cervical / Trunk Assessment: Kyphotic   Communication Communication Communication: No difficulties   Cognition Arousal/Alertness: Awake/alert Behavior During Therapy: WFL for tasks assessed/performed Overall Cognitive Status: Within Functional Limits for tasks assessed                                                        Home Living Family/patient expects to be discharged to:: Private residence Living Arrangements: Non-relatives/Friends Available Help at Discharge: Personal care attendant;Available PRN/intermittently Type of Home: House Home Access: Stairs to enter [employer] of Steps: 1 Entrance Stairs-Rails: None Home Layout: Two level;Able to live on main level with bedroom/bathroom Alternate Level Stairs-Number of Steps:  (pt does not use stairs)    Bathroom Shower/Tub: Astronomer Accessibility: Yes How Accessible: Accessible via walker Home Equipment: Rolling Walker (2 wheels);Wheelchair - manual;Cane - single point (walk in grab bars)   Additional Comments: Pt has care attendants present 7 days a week from 8 AM to 8 PM. Pt uses 2L O2 at night       Prior Functioning/Environment Prior Level of Function : Needs assist       Physical Assist : Mobility (physical);ADLs (physical) Mobility (physical): Transfers;Gait;Stairs ADLs (physical): Bathing;Dressing;IADLs Mobility Comments: Pt reports household ambulation with RW without need for additional physical assist. ADLs Comments: Pt reports need for assist for dressing, bathing, and IADL's from care attendant staff. Pt in independent for grooming and toileting.        OT Problem List: Decreased strength;Impaired balance (sitting and/or standing)      OT Treatment/Interventions: Self-care/ADL training;Therapeutic exercise;Therapeutic activities;Balance training;Patient/family education    OT Goals(Current goals can be found in the care plan section) Acute Rehab OT Goals Patient Stated Goal: return home OT Goal Formulation: With patient Time For Goal Achievement: 01/22/22 Potential to Achieve Goals: Good  OT Frequency: Min 1X/week    Co-evaluation PT/OT/SLP Co-Evaluation/Treatment: Yes Reason for Co-Treatment: To address functional/ADL transfers   OT goals addressed during session: ADL's and self-care      AM-PAC OT "6 Clicks" Daily Activity     Outcome Measure  End of Session Equipment Utilized During Treatment: Rolling walker (2 wheels)  Activity Tolerance: Patient tolerated treatment well Patient left: in chair;with call bell/phone within reach  OT Visit Diagnosis: Unsteadiness on feet (R26.81);Muscle weakness (generalized) (M62.81)                Time: 9675-9163 OT Time Calculation (min): 14 min Charges:  OT General  Charges $OT Visit: 1 Visit OT Evaluation $OT Eval Low Complexity: 1 Low  Hollister Wessler OT, MOT  Larey Seat 01/08/2022, 10:02 AM

## 2022-01-08 NOTE — Progress Notes (Signed)
PROGRESS NOTE     Kailan Laws, is a 88 y.o. male, DOB - 07-20-1934, CHE:[id number]  Admit date - 01/07/2022   Admitting Physician Deatra James, MD  Outpatient Primary MD for the patient is [name], MD  LOS - 1  Chief Complaint  Patient presents with   Shortness of Breath        Brief Narrative:   88 year old WM with new ESRD-  has been on HD for a week-  now presents with DOE-  new onset Afib and worsening anemia  KAHIL AGNER is a 88 year old male with history of ESRD -recently started on hemodialysis( TTS), HTN, HLD, GERD, Systolic CHF, Depression, h/o hematuria, Chronic anemia, Chronic hyponatremia, Multiple hospitalizations at [facility].  Multiple ED visits.AV graft Rt arm, He has received EPO all injection as an outpatient. Presented today with chief complaint of shortness of breath.  Progressively worsened after hemodialysis on Saturday. Denies any chest pain, weight gain, weight loss with hemodialysis 0.5 lbs   ED:  Blood pressure (!) 127/51, pulse 96, temperature 98.6 F (37 C), temperature source Oral, resp. rate (!) 22, height 5\' 8"  (1.727 m), weight 77.1 kg, SpO2 98 %.  -Was found in A-fib heart rate 69-1 04 currently 96 - LABS: Hemoglobin 7.0, sodium 134, potassium 3.4, chloride 92, BUN 36 creatinine 4.58, BNP1,222 -Elevated D-dimer 3.78, INR 1.2, -SARS-CoV-2, influenza A/B all negative -Chest x-ray reviewed increased small bilateral pleural effusion -EKG with a heart rate of 97, and A-fib (reviewing records January, 26 2023 was also in A-fib)      Assessment & Plan:   -Assessment and Plan: * SOB (shortness of breath)- (present on admission) - Likely due to volume overload -Obvious lower extremity pitting edema, -No hypoxia, continue to use hemodialysis to address volume  overload status and dyspnea  ESRD (end stage renal disease) ([place])- (present on admission) -Per patient started on hemodialysis last week -Status post 3 session of  hemodialysis, last on Saturday -Prior long history of kidney failure  - Patient has a right arm AV graft > 2 years -Hemodialysis adjustments per nephrology team  Paroxysmal A-fib [facility])- (present on admission) -Likely exacerbated by anemia, -CHADs Vas score > 3 - Seems to be new onset A-fib, although multiple EKGs from January was reviewed revealing atrial fibrillation -Heart rate 90s to 104 -Rate control, and chronic anticoagulation was treatment discussed with the patient and POA his son on the phone.... They expressed understanding and agreement plan -Continue Eliquis low-dose -Already on Coreg, will be titrated better rate control -Echo from 05/07/2021 was reviewed, ejection fraction 55-60% -Cardiac enzymes negative x2  Pressure injury of skin- (present on admission) - Wound care as advised  Chronic systolic CHF (congestive heart failure) ([place])- (present on admission) -?  Chronic diastolic heart failure -Last echocardiogram reviewed from 05/21/2021; Normal Left ventricular ejection fraction, by estimation, is 55 to 60% -Significant elevated proBNP not reliable due toCKD -Weight not reliable due to CKD, volume retention -Continue statins, beta-blockers -Continue to use hemodialysis to address volume status  Personal history of prostate cancer - In remission to follow-up as an outpatient -Continue Flomax  Anemia in chronic kidney disease- (present on admission) - Acute on chronic anemia of chronickidney disease with iron deficiency anemia -Per caregiver patient and his POA son he has anemia of chronic disease, iron deficiency anemia, apparently gets Epogen shots as an outpatient -Hemoglobin 8.7-9 (January 26 was 8.8) -Reports of no hematuria or hematochezia -hgb improved significantly after transfusion, ESA  and iron infusion  Hyperlipidemia- (present on admission) - Continue statins (Crestor)  Essential hypertension- (present on admission) - Currently stable, home medication  Norvasc, Coreg, -As needed hydralazine, Lasix  CAD (coronary artery disease)- (present on admission) - Currently stable, continue beta-blockers, statins Chest pain-free  Chronic diastolic CHF (congestive heart failure) (HCC)-resolved as of 01/07/2022, (present on admission) .      Disposition/Need for in-Hospital Stay- patient unable to be discharged at this time due to --dyspnea and volume overload status requiring hemodialysis -Possible discharge home with home health services on 01/09/2022 -Physical therapy eval appreciated  Status is: Inpatient    Disposition: The patient is from: Home              Anticipated d/c is to: Home              Anticipated d/c date is: 1 day              Patient currently is not medically stable to d/c. Barriers: Not Clinically Stable-   Code Status :  -  Code Status: Full Code   Family Communication:    NA (patient is alert, awake and coherent)   Consults  :  Nephrology  DVT Prophylaxis  :   - SCDs   apixaban (ELIQUIS) tablet 2.5 mg Start: 01/07/22 1530 Place and maintain sequential compression device Start: 01/07/22 1510 TED hose Start: 01/07/22 1315 SCDs Start: 01/07/22 1315 apixaban (ELIQUIS) tablet 2.5 mg    Lab Results  Component Value Date   PLT 160 01/08/2022    Inpatient Medications  Scheduled Meds:  sodium chloride   Intravenous Once   apixaban  2.5 mg Oral BID   Chlorhexidine Gluconate Cloth  6 each Topical Q0600   darbepoetin (ARANESP) injection - DIALYSIS  200 mcg Intravenous Q Tue-HD   metoprolol tartrate  100 mg Oral BID   sodium chloride flush  3 mL Intravenous Q12H   sodium chloride flush  3 mL Intravenous Q12H   Continuous Infusions:  sodium chloride     sodium chloride     sodium chloride     albumin human     ferric gluconate (FERRLECIT) IVPB 250 mg (01/08/22 1049)   PRN Meds:.sodium chloride, sodium chloride, sodium chloride, acetaminophen **OR** acetaminophen, albumin human, bisacodyl, hydrALAZINE,  HYDROmorphone (DILAUDID) injection, ipratropium, levalbuterol, lidocaine (PF), lidocaine-prilocaine, ondansetron **OR** ondansetron (ZOFRAN) IV, oxyCODONE, pentafluoroprop-tetrafluoroeth, senna-docusate, sodium chloride flush, traZODone   Anti-infectives (From admission, onward)    None         Subjective: Julieta Gutting today has no fevers, no emesis,  No chest pain,   - Dyspnea and fatigue persist -Concerns about volume overload status   Objective: Vitals:   01/08/22 1300 01/08/22 1315 01/08/22 1330 01/08/22 1531  BP: 105/62 115/63 115/61 108/84  Pulse: 99 95 (!) 105 96  Resp:    18  Temp:    97.8 F (36.6 C)  TempSrc:    Oral  SpO2:    95%  Weight:      Height:        Intake/Output Summary (Last 24 hours) at 01/08/2022 1855 Last data filed at 01/08/2022 1700 Gross per 24 hour  Intake 1977.9 ml  Output --  Net 1977.9 ml   Filed Weights   01/07/22 1713 01/08/22 0500 01/08/22 1000  Weight: 80.5 kg 79.8 kg 80.9 kg     Physical Exam  Gen:- Awake Alert,  in no apparent distress  HEENT:- [place].AT, No sclera icterus Neck-Supple Neck,No JVD,.  Lungs-  CTAB , fair symmetrical air movement CV- S1, S2 normal, regular  Abd-  +ve B.Sounds, Abd Soft, No tenderness,    Extremity/Skin:- +ve edema, pedal pulses present  Psych-affect is appropriate, oriented x3 Neuro-generalized weakness, no new focal deficits, no tremors MSK- Lt UE AV fistula positive thrill and bruit  Data Reviewed: I have personally reviewed following labs and imaging studies  CBC: Recent Labs  Lab 01/07/22 1118 01/07/22 1612 01/08/22 0555  WBC 9.8  --  8.3  NEUTROABS 8.4*  --   --   HGB 7.0* 7.4* 10.3*  HCT 22.8* 23.6* 30.8*  MCV 105.1*  --  96.3  PLT 191  --  169   Basic Metabolic Panel: Recent Labs  Lab 01/07/22 1118 01/08/22 0555  NA 134* 133*  K 3.4* 3.3*  CL 92* 95*  CO2 29 27  GLUCOSE 135* 103*  BUN 36* 41*  CREATININE 4.58* 4.76*  CALCIUM 8.5* 8.4*   GFR: Estimated Creatinine  Clearance: 11 mL/min (A) (by C-G formula based on SCr of 4.76 mg/dL (H)). Liver Function Tests: Recent Labs  Lab 01/07/22 1118  AST 22  ALT 15  ALKPHOS 70  BILITOT 0.3  PROT 7.4  ALBUMIN 3.0*   No results for input(s): LIPASE, AMYLASE in the last 168 hours. No results for input(s): AMMONIA in the last 168 hours. Coagulation Profile: Recent Labs  Lab 01/08/22 0555  INR 1.5*   Cardiac Enzymes: No results for input(s): CKTOTAL, CKMB, CKMBINDEX, TROPONINI in the last 168 hours. BNP (last 3 results) No results for input(s): PROBNP in the last 8760 hours. HbA1C: No results for input(s): HGBA1C in the last 72 hours. CBG: Recent Labs  Lab 01/08/22 0823  GLUCAP 106*   Lipid Profile: No results for input(s): CHOL, HDL, LDLCALC, TRIG, CHOLHDL, LDLDIRECT in the last 72 hours. Thyroid Function Tests: No results for input(s): TSH, T4TOTAL, FREET4, T3FREE, THYROIDAB in the last 72 hours. Anemia Panel: Recent Labs    01/07/22 1333  VITAMINB12 930*  TIBC 209*  IRON 28*   Urine analysis:    Component Value Date/Time   COLORURINE YELLOW 08/23/2021 1143   APPEARANCEUR CLEAR 08/23/2021 1143   LABSPEC 1.020 08/23/2021 1143   PHURINE 6.0 08/23/2021 1143   GLUCOSEU NEGATIVE 08/23/2021 1143   HGBUR MODERATE (A) 08/23/2021 1143   BILIRUBINUR NEGATIVE 08/23/2021 1143   KETONESUR NEGATIVE 08/23/2021 1143   PROTEINUR 100 (A) 08/23/2021 1143   UROBILINOGEN 0.2 03/14/2010 0809   NITRITE NEGATIVE 08/23/2021 1143   LEUKOCYTESUR NEGATIVE 08/23/2021 1143   Sepsis Labs: @LABRCNTIP (procalcitonin:4,lacticidven:4)  ) Recent Results (from the past 240 hour(s))  Resp Panel by RT-PCR (Flu A&B, Covid) Nasopharyngeal Swab     Status: None   Collection Time: 01/07/22 11:12 AM   Specimen: Nasopharyngeal Swab; Nasopharyngeal(NP) swabs in vial transport medium  Result Value Ref Range Status   SARS Coronavirus 2 by RT PCR NEGATIVE NEGATIVE Final    Comment: (NOTE) SARS-CoV-2 target nucleic  acids are NOT DETECTED.  The SARS-CoV-2 RNA is generally detectable in upper respiratory specimens during the acute phase of infection. The lowest concentration of SARS-CoV-2 viral copies this assay can detect is 138 copies/mL. A negative result does not preclude SARS-Cov-2 infection and should not be used as the sole basis for treatment or other patient management decisions. A negative result may occur with  improper specimen collection/handling, submission of specimen other than nasopharyngeal swab, presence of viral mutation(s) within the areas targeted by this assay, and inadequate number of viral copies(<138 copies/mL).  A negative result must be combined with clinical observations, patient history, and epidemiological information. The expected result is Negative.  Fact Sheet for Patients:  EntrepreneurPulse.com.au  Fact Sheet for Healthcare Providers:  IncredibleEmployment.be  This test is no t yet approved or cleared by the [place] FDA and  has been authorized for detection and/or diagnosis of SARS-CoV-2 by FDA under an Emergency Use Authorization (EUA). This EUA will remain  in effect (meaning this test can be used) for the duration of the COVID-19 declaration under Section 564(b)(1) of the Act, 21 U.S.C.section 360bbb-3(b)(1), unless the authorization is terminated  or revoked sooner.       Influenza A by PCR NEGATIVE NEGATIVE Final   Influenza B by PCR NEGATIVE NEGATIVE Final    Comment: (NOTE) The Xpert Xpress SARS-CoV-2/FLU/RSV plus assay is intended as an aid in the diagnosis of influenza from Nasopharyngeal swab specimens and should not be used as a sole basis for treatment. Nasal washings and aspirates are unacceptable for Xpert Xpress SARS-CoV-2/FLU/RSV testing.  Fact Sheet for Patients: EntrepreneurPulse.com.au  Fact Sheet for Healthcare Providers: IncredibleEmployment.be  This  test is not yet approved or cleared by the [place] FDA and has been authorized for detection and/or diagnosis of SARS-CoV-2 by FDA under an Emergency Use Authorization (EUA). This EUA will remain in effect (meaning this test can be used) for the duration of the COVID-19 declaration under Section 564(b)(1) of the Act, 21 U.S.C. section 360bbb-3(b)(1), unless the authorization is terminated or revoked.  Performed at [facility], [address], [place], [place] [address]       Radiology Studies: NM Pulmonary Perfusion  Result Date: 01/07/2022 CLINICAL DATA:  Chest pain for several days EXAM: NUCLEAR MEDICINE PERFUSION LUNG SCAN TECHNIQUE: Perfusion images were obtained in multiple projections after intravenous injection of radiopharmaceutical. Ventilation scans intentionally deferred if perfusion scan and chest x-ray adequate for interpretation during COVID 19 epidemic. RADIOPHARMACEUTICALS:  4.1 mCi Tc-39m MAA IV COMPARISON:  01/07/2022 FINDINGS: Planar images of the chest are obtained during the perfusion phase of the exam. There are no wedge-shaped perfusion defects to suggest pulmonary embolus. Decreased radiotracer uptake within the left lung base consistent with cardiomegaly and left basilar consolidation and/or effusions seen on corresponding chest x-ray. IMPRESSION: 1. No evidence of acute pulmonary embolus. Electronically Signed   By: Randa Ngo M.D.   On: 01/07/2022 15:27   DG Chest Port 1 View  Result Date: 01/07/2022 CLINICAL DATA:  Shortness of breath. EXAM: PORTABLE CHEST 1 VIEW COMPARISON:  December 27, 2021. FINDINGS: Stable cardiomegaly. Small bilateral pleural effusions are noted with associated bibasilar atelectasis or edema. Old left clavicular fracture is noted. IMPRESSION: Small bilateral pleural effusions with associated bibasilar atelectasis or edema. Electronically Signed   By: Marijo Conception M.D.   On: 01/07/2022 11:57     Scheduled Meds:  sodium chloride    Intravenous Once   apixaban  2.5 mg Oral BID   Chlorhexidine Gluconate Cloth  6 each Topical Q0600   darbepoetin (ARANESP) injection - DIALYSIS  200 mcg Intravenous Q Tue-HD   metoprolol tartrate  100 mg Oral BID   sodium chloride flush  3 mL Intravenous Q12H   sodium chloride flush  3 mL Intravenous Q12H   Continuous Infusions:  sodium chloride     sodium chloride     sodium chloride     albumin human     ferric gluconate (FERRLECIT) IVPB 250 mg (01/08/22 1049)     LOS: 1 day  Roxan Hockey M.D on 01/08/2022 at 6:55 PM  Go to [email or web address] - for contact info  [facility] - Office  [phone]  If 7PM-7AM, please contact night-coverage [email or web address] Password [facility] 01/08/2022, 6:55 PM

## 2022-01-09 ENCOUNTER — Ambulatory Visit: Payer: Medicare Other | Admitting: Urology

## 2022-01-09 DIAGNOSIS — Z992 Dependence on renal dialysis: Secondary | ICD-10-CM

## 2022-01-09 DIAGNOSIS — Z7189 Other specified counseling: Secondary | ICD-10-CM | POA: Diagnosis not present

## 2022-01-09 DIAGNOSIS — Z515 Encounter for palliative care: Secondary | ICD-10-CM

## 2022-01-09 DIAGNOSIS — R0602 Shortness of breath: Secondary | ICD-10-CM | POA: Diagnosis not present

## 2022-01-09 DIAGNOSIS — N186 End stage renal disease: Secondary | ICD-10-CM | POA: Diagnosis not present

## 2022-01-09 DIAGNOSIS — Z66 Do not resuscitate: Secondary | ICD-10-CM | POA: Diagnosis not present

## 2022-01-09 LAB — CBC
HCT: 30.8 % — ABNORMAL LOW (ref 39.0–52.0)
Hemoglobin: 9.8 g/dL — ABNORMAL LOW (ref 13.0–17.0)
MCH: 31 pg (ref 26.0–34.0)
MCHC: 31.8 g/dL (ref 30.0–36.0)
MCV: 97.5 fL (ref 80.0–100.0)
Platelets: 178 10*3/uL (ref 150–400)
RBC: 3.16 MIL/uL — ABNORMAL LOW (ref 4.22–5.81)
RDW: 15.4 % (ref 11.5–15.5)
WBC: 8.7 10*3/uL (ref 4.0–10.5)
nRBC: 0 % (ref 0.0–0.2)

## 2022-01-09 LAB — BASIC METABOLIC PANEL
Anion gap: 8 (ref 5–15)
BUN: 28 mg/dL — ABNORMAL HIGH (ref 8–23)
CO2: 29 mmol/L (ref 22–32)
Calcium: 8.5 mg/dL — ABNORMAL LOW (ref 8.9–10.3)
Chloride: 95 mmol/L — ABNORMAL LOW (ref 98–111)
Creatinine, Ser: 3.72 mg/dL — ABNORMAL HIGH (ref 0.61–1.24)
GFR, Estimated: 15 mL/min — ABNORMAL LOW (ref >60)
Glucose, Bld: 101 mg/dL — ABNORMAL HIGH (ref 70–99)
Potassium: 3.2 mmol/L — ABNORMAL LOW (ref 3.5–5.1)
Sodium: 132 mmol/L — ABNORMAL LOW (ref 135–145)

## 2022-01-09 LAB — GLUCOSE, CAPILLARY: Glucose-Capillary: 105 mg/dL — ABNORMAL HIGH (ref 70–99)

## 2022-01-09 MED ORDER — MIDODRINE HCL 5 MG PO TABS: 5.0000 mg | ORAL_TABLET | ORAL | 1 refills | Status: DC

## 2022-01-09 MED ORDER — APIXABAN 2.5 MG PO TABS: 2.5000 mg | ORAL_TABLET | Freq: Two times a day (BID) | ORAL | 2 refills | Status: AC

## 2022-01-09 MED ORDER — SENNOSIDES-DOCUSATE SODIUM 8.6-50 MG PO TABS
2.0000 | ORAL_TABLET | Freq: Every day | ORAL | 11 refills | Status: DC
Start: 2022-01-09 — End: 2022-01-21

## 2022-01-09 MED ORDER — ALBUTEROL SULFATE HFA 108 (90 BASE) MCG/ACT IN AERS: 2.0000 | INHALATION_SPRAY | Freq: Four times a day (QID) | RESPIRATORY_TRACT | 1 refills | Status: DC | PRN

## 2022-01-09 MED ORDER — SODIUM BICARBONATE 650 MG PO TABS: 650.0000 mg | ORAL_TABLET | Freq: Three times a day (TID) | ORAL | 1 refills | Status: DC

## 2022-01-09 MED ORDER — ARIPIPRAZOLE 20 MG PO TABS: 20.0000 mg | ORAL_TABLET | Freq: Every day | ORAL | 3 refills | Status: AC

## 2022-01-09 MED ORDER — POTASSIUM CHLORIDE CRYS ER 20 MEQ PO TBCR
40.0000 meq | EXTENDED_RELEASE_TABLET | Freq: Once | ORAL | Status: AC
  Administered 2022-01-09: 40 meq via ORAL
  Filled 2022-01-09: qty 2

## 2022-01-09 MED ORDER — ACETAMINOPHEN 325 MG PO TABS: 650.0000 mg | ORAL_TABLET | Freq: Four times a day (QID) | ORAL | 0 refills | Status: DC | PRN

## 2022-01-09 MED ORDER — METOPROLOL TARTRATE 25 MG PO TABS: 25.0000 mg | ORAL_TABLET | Freq: Two times a day (BID) | ORAL | 3 refills | Status: AC

## 2022-01-09 NOTE — Consult Note (Signed)
° °                                                                                °Consultation Note °Date: 01/09/2022  ° °Patient Name: [name] [name]  °DOB: 11/27/1934  MRN: [id number]  Age / Sex: 88 y.o., male  °PCP: Hall, John Z, MD °Referring Physician: Emokpae, Courage, MD ° °Reason for Consultation: Establishing goals of care ° °HPI/Patient Profile: 88 y.o. male  with past medical history of ESRD on HD TTS (recently started 1 week ago), HTN, HLD, CHF, chronic anemia, chronic hyponatremia, prostate cancerGERD, depression, hematuria admitted on 01/07/2022 with shortness of breath with fluid overload and new onset atrial fibrillation as well as worsening anemia. He has received dialysis and goals met with 2.4 L removed and blood transfusion. He has private caregivers at home daily 8a-8p.  ° °Clinical Assessment and Goals of Care: °I met today with Mr. Devries with his caregiver, Lynette, at bedside. Lynette has been with Mr. Lewison for ~4 years now and he has much trust in her. Mr. Sallie appears weak and fatigued. He has coffee and a donut that Lynette brought for him. He eats all his donut. We discussed his overall status and health changes over the past month with starting dialysis, more weakness, shortness of breath, and fatigue. Lynette also reports that his appetite has been worse especially on dialysis days he is not hungry after dialysis. We discussed using supplements such as Nepro to get some nutrition if he feels he cannot eat a meal. Lynette also voices concern for his many medications and wants to ensure that he still needs them all and concern that they are dialyzed out of his system. I suggested consideration of shifting medication schedule to taking his medications after dialysis as a possibility. We also discussed the limited benefits of Lasix and Flomax if he is no longer urinating. I encouraged her to discuss further with each of his specialists.  ° °I discussed more with Mr. Amodei about his  concerns with his declining health and his wishes and expectations. He is hoping that he can work with therapy and gain some of his strength back. He understands the challenge of dialysis and the stress on the heart. He tells me that he has a Living Will. We discussed code status and he verifies DNR status and Lynette confirms that they have this in place at home - orders will be changed in chart. He confirms that if it gets that bad "just let me go." He is hopeful for some level of improvement in his quality of life. They also share that they are already connected with outpatient palliative care with [facility] and Lynette shares that she believes that they have a visit scheduled for next week. I encouraged that developing this relationship and having ongoing conversations will be beneficial so they can have assistance knowing how to move forward based on outcomes and progression. I encouraged them to call on palliative care if they need follow up sooner than scheduled.  ° °All questions/concerns addressed. Emotional support provided.  ° °Primary Decision Maker °PATIENT °  ° °SUMMARY OF RECOMMENDATIONS   °- DNR  °- Hopeful for ongoing   dialysis and to work with therapy to try and regain some strength °- Outpatient palliative already following ° °Code Status/Advance Care Planning: °DNR ° ° °Symptom Management:  °Per attending.  ° °Palliative Prophylaxis:  °Bowel Regimen, Delirium Protocol, Frequent Pain Assessment, and Turn Reposition ° °Prognosis:  °Overall prognosis poor with declining functional status and recent initiation of dialysis.  ° °Discharge Planning: Home with Palliative Services  ° °  ° °Primary Diagnoses: °Present on Admission: ° SOB (shortness of breath) ° Essential hypertension ° Hyperlipidemia ° CAD (coronary artery disease) ° Paroxysmal A-fib (HCC) ° ESRD (end stage renal disease) (HCC) ° (Resolved) Anemia ° Anemia in chronic kidney disease ° (Resolved) Chronic diastolic CHF (congestive heart  failure) (HCC) ° Chronic systolic CHF (congestive heart failure) (HCC) ° Pressure injury of skin ° ° °I have reviewed the medical record, interviewed the patient and family, and examined the patient. The following aspects are pertinent. 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   ° Cancer (HCC)   ° prostate ca 1999/removal/ rad tx  ° Carotid artery occlusion   ° Chronic diastolic heart failure (HCC)   ° CKD (chronic kidney disease)   ° Depression   ° DVT (deep venous thrombosis) (HCC)   ° Gout   ° History of kidney stones   ° Hyperlipidemia   ° Hypertension   ° Hyponatremia   ° Incontinence   ° Reflux   ° Renal disorder   ° Status post implantation of artificial urinary sphincter   ° Wears dentures   ° Wears glasses   ° °Social History  ° °Socioeconomic History  ° Marital status: Widowed  °  Spouse name: Not on file  ° Number of children: 2  ° Years of education: Not on file  ° Highest education level: Not on file  °Occupational History  ° Occupation: retired  °Tobacco Use  ° Smoking status: Former  °  Packs/day: 2.00  °  Types: Cigarettes  °  Start date: 08/05/1962  °  Quit date: 12/02/1972  °  Years since quitting: 49.1  ° Smokeless tobacco: Never  °Vaping Use  ° Vaping Use: Never used  °Substance and Sexual Activity  ° Alcohol use: No  ° Drug use: No  ° Sexual activity: Not on file  °Other Topics Concern  ° Not on file  °Social History Narrative  ° Not on file  ° °Social Determinants of Health  ° °Financial Resource Strain: Not on file  °Food Insecurity: Not on file  °Transportation Needs: Not on file  °Physical Activity: Not on file  °Stress: Not on file  °Social Connections: Not on file  ° °Family History  °Problem Relation Age of Onset  ° Coronary artery disease Other   °     family history, male < 55  ° Arthritis Other   °     family history  ° Cancer Other   ° Kidney disease  Other   ° °Scheduled Meds: ° sodium chloride   Intravenous Once  ° apixaban  2.5 mg Oral BID  ° Chlorhexidine Gluconate Cloth  6 each Topical Q0600  ° darbepoetin (ARANESP) injection - DIALYSIS  200 mcg Intravenous Q Tue-HD  ° metoprolol tartrate  100 mg Oral BID  ° sodium chloride flush  3 mL Intravenous Q12H  ° sodium chloride flush    chloride flush  3 mL Intravenous Q12H   Continuous Infusions:  sodium chloride     sodium chloride     sodium chloride     albumin human     ferric gluconate (FERRLECIT) IVPB 135 mL/hr at 01/09/22 0453   PRN Meds:.sodium chloride, sodium chloride, sodium chloride, acetaminophen **OR** acetaminophen, albumin human, bisacodyl, hydrALAZINE, HYDROmorphone (DILAUDID) injection, ipratropium, levalbuterol, lidocaine (PF), lidocaine-prilocaine, ondansetron **OR** ondansetron (ZOFRAN) IV, oxyCODONE, pentafluoroprop-tetrafluoroeth, senna-docusate, sodium chloride flush, traZODone Allergies  Allergen Reactions   Ivp Dye [Iodinated Contrast Media]     Due to partial renal failure   Nalbuphine Other (See Comments)    PATIENT STATES IT MADE HIM FEEL LIKE HE WAS GOING TO DIE. NO SPECIFICS   Codeine Anxiety   Review of Systems  Constitutional:  Positive for activity change, appetite change and fatigue.  Respiratory:  Negative for shortness of breath.   Neurological:  Positive for weakness.   Physical Exam Vitals and nursing note reviewed.  Constitutional:      General: He is not in acute distress.    Appearance: He is ill-appearing.  Cardiovascular:     Rate and Rhythm: Normal rate.  Pulmonary:     Effort: No tachypnea, accessory muscle usage or respiratory distress.     Comments: 2L Abdominal:     General: Abdomen is flat.  Skin:    Coloration: Skin is pale.  Neurological:     Mental Status: He is alert and oriented to person, place, and time.    Vital Signs: BP 98/63 (BP Location: Right Arm)    Pulse 96    Temp (!) 97 F (36.1 C)    Resp 19    Ht 5' 8" (1.727 m)    Wt 81.2  kg    SpO2 98%    BMI 27.22 kg/m  Pain Scale: 0-10   Pain Score: 0-No pain   SpO2: SpO2: 98 % O2 Device:SpO2: 98 % O2 Flow Rate: .O2 Flow Rate (L/min): 2 L/min  IO: Intake/output summary:  Intake/Output Summary (Last 24 hours) at 01/09/2022 0844 Last data filed at 01/09/2022 9211 Gross per 24 hour  Intake 1359.6 ml  Output 300 ml  Net 1059.6 ml    LBM: Last BM Date: 01/07/22 Baseline Weight: Weight: 77.1 kg Most recent weight: Weight: 81.2 kg     Palliative Assessment/Data:      Time Total: 75 min  Greater than 50%  of this time was spent counseling and coordinating care related to the above assessment and plan.  Signed by: Vinie Sill, NP Palliative Medicine Team Pager # [phone] (M-F 8a-5p) Team Phone # [phone] (Nights/Weekends)

## 2022-01-09 NOTE — Plan of Care (Signed)

## 2022-01-09 NOTE — TOC Transition Note (Signed)
Transition of Care [facility]) - CM/SW Discharge Note   Patient Details  Name: [name] [name] MRN: [id number] Date of Birth: 1934/11/30  Transition [facility]) CM/SW Contact:  Boneta Lucks, RN Phone Number: 01/09/2022, 10:48 AM   Clinical Narrative:  Patient discharging home. MD aware to order home health. Linda with [facility] updated.    Final next level of care: [place] Barriers to Discharge: Barriers Resolved  Patient Goals and CMS Choice Patient states their goals for this hospitalization and ongoing recovery are:: to go home. CMS Medicare.gov Compare Post Acute Care list provided to:: Patient Represenative (must comment)   Discharge Placement          Patient and family notified of of transfer: 01/09/22  Discharge Plan and Services    HH Arranged: RN, PT [facility] Agency: [place] ([place]) Date [place]: 01/08/22 Time [place]: 1030 Representative spoke with at [place]: Romualdo Bolk   Readmission Risk Interventions Readmission Risk Prevention Plan 01/08/2022 06/06/2020  Transportation Screening Complete Complete  PCP or Specialist Appt within 3-5 Days - Complete  HRI or [place] - Complete  Social Work Consult for [place] Planning/Counseling - Complete  Palliative Care Screening - Not Applicable  Medication Review Press photographer) Complete Complete  HRI or Home Care Consult Complete -  SW Recovery Care/Counseling Consult Complete -  Palliative Care Screening Not Applicable -  [place] Not Applicable -  Some recent data might be hidden

## 2022-01-09 NOTE — Progress Notes (Addendum)
°  [facility] Progress Note   Assessment/ Plan:   HD [facility]-  TTS- no other details known-  has only been on HD for a week     Assessment/Plan: 88 year old M with new ESRD-  has been on HD for a week-  now presents with DOE-  new onset Afib and worsening anemia 1.Renal- new start to dialysis -  has only been on for a week -  going OK according to him.  His BUN, electrolytes yesterday were reasonable-  would not expect would be giving him symptoms.  Plan for routine HD today via AVG.  Plan for HD tomorrow with more UF if possible if still here 2. Hypertension/volume  - CXR shows bilat effusions and edema-  he also has a little peripheral edema-  will plan to UF as able with HD-  home med list indicates amlodipine and coreg-  would hold at least amlodipine right now  3.  SOB/DOE-  most likely a combo of volume overload, new onset Afib and anemia.  4. Afib-  rate around 100-  to be started on eliquis and stay on beta blocker-  HR better this AM 5. Anemia  - not unusual for ESRD patient to have anemia-  s/p blood with HD-  will also give IV iron and ESA.   6. Bones-   calc and phos WNL-  no bone active meds for now   Subjective:    Seen in room.  Had HD yesterday with 2.4 L off.  Still on 3L O2, eating a donut.     Objective:   BP (!) 113/51    Pulse 88    Temp (!) 97 F (36.1 C)    Resp 19    Ht 5\' 8"  (1.727 m)    Wt 81.2 kg    SpO2 98%    BMI 27.22 kg/m   Physical Exam: Gen:NAD, sitting in bed CVS: RRR Resp: muffled at bases UKG:URKY Ext: 1+ LE edema ACCESS: LAVG  Labs: BMET Recent Labs  Lab 01/07/22 1118 01/08/22 0555 01/09/22 0529  NA 134* 133* 132*  K 3.4* 3.3* 3.2*  CL 92* 95* 95*  CO2 29 27 29   GLUCOSE 135* 103* 101*  BUN 36* 41* 28*  CREATININE 4.58* 4.76* 3.72*  CALCIUM 8.5* 8.4* 8.5*   CBC Recent Labs  Lab 01/07/22 1118 01/07/22 1612 01/08/22 0555 01/09/22 0529  WBC 9.8  --  8.3 8.7  NEUTROABS 8.4*  --   --   --   HGB 7.0* 7.4*  10.3* 9.8*  HCT 22.8* 23.6* 30.8* 30.8*  MCV 105.1*  --  96.3 97.5  PLT 191  --  160 178      Medications:     sodium chloride   Intravenous Once   apixaban  2.5 mg Oral BID   Chlorhexidine Gluconate Cloth  6 each Topical Q0600   darbepoetin (ARANESP) injection - DIALYSIS  200 mcg Intravenous Q Tue-HD   metoprolol tartrate  100 mg Oral BID   sodium chloride flush  3 mL Intravenous Q12H   sodium chloride flush  3 mL Intravenous Q12H     Madelon Lips MD 01/09/2022, 10:12 AM

## 2022-01-09 NOTE — Progress Notes (Signed)
Nsg Discharge Note  Admit Date:  01/07/2022 Discharge date: 01/09/2022   [name] [name] to be D/C'd Home per MD order.  AVS completed.  Copy for chart, and copy for patient signed, and dated. Patient/caregiver able to verbalize understanding.  Discharge Medication: Allergies as of 01/09/2022       Reactions   Ivp Dye [iodinated Contrast Media]    Due to partial renal failure   Nalbuphine Other (See Comments)   PATIENT STATES IT MADE HIM FEEL LIKE HE WAS GOING TO DIE. NO SPECIFICS   Codeine Anxiety        Medication List     STOP taking these medications    amLODipine 10 MG tablet Commonly known as: NORVASC   amoxicillin-clavulanate 875-125 MG tablet Commonly known as: AUGMENTIN   carvedilol 6.25 MG tablet Commonly known as: COREG   cefdinir 300 MG capsule Commonly known as: OMNICEF   finasteride 5 MG tablet Commonly known as: PROSCAR   furosemide 20 MG tablet Commonly known as: LASIX   tamsulosin 0.4 MG Caps capsule Commonly known as: FLOMAX   traMADol 50 MG tablet Commonly known as: Ultram       TAKE these medications    acetaminophen 500 MG tablet Commonly known as: TYLENOL Take 1,000 mg by mouth every 8 (eight) hours as needed for mild pain or moderate pain. For headache pain What changed: Another medication with the same name was added. Make sure you understand how and when to take each.   acetaminophen 325 MG tablet Commonly known as: TYLENOL Take 2 tablets (650 mg total) by mouth every 6 (six) hours as needed for mild pain (or Fever >/= 101). What changed: You were already taking a medication with the same name, and this prescription was added. Make sure you understand how and when to take each.   albuterol 108 (90 Base) MCG/ACT inhaler Commonly known as: VENTOLIN HFA Inhale 2 puffs into the lungs every 6 (six) hours as needed for wheezing or shortness of breath (cough). What changed:  how much to take reasons to take this   allopurinol 100 MG  tablet Commonly known as: ZYLOPRIM Take 100 mg by mouth daily.   ALPRAZolam 0.5 MG tablet Commonly known as: XANAX Take 1 tablet (0.5 mg total) by mouth 2 (two) times daily as needed for anxiety.   apixaban 2.5 MG Tabs tablet Commonly known as: ELIQUIS Take 1 tablet (2.5 mg total) by mouth 2 (two) times daily.   ARIPiprazole 20 MG tablet Commonly known as: ABILIFY Take 1 tablet (20 mg total) by mouth daily.   epoetin alfa 3000 UNIT/ML injection Commonly known as: EPOGEN Inject 3,000 Units into the skin every 14 (fourteen) days.   fish oil-omega-3 fatty acids 1000 MG capsule Take 1 g by mouth daily.   lansoprazole 15 MG capsule Commonly known as: PREVACID Take 1 capsule (15 mg total) by mouth 2 (two) times daily before a meal.   metoprolol tartrate 25 MG tablet Commonly known as: LOPRESSOR Take 1 tablet (25 mg total) by mouth 2 (two) times daily.   midodrine 5 MG tablet Commonly known as: PROAMATINE Take 1 tablet (5 mg total) by mouth every Tuesday, Thursday, Saturday, and Sunday. Take before hemodialysis Start taking on: January 10, 2022   multivitamin with minerals Tabs tablet Take 1 tablet by mouth daily.   ofloxacin 0.3 % OTIC solution Commonly known as: FLOXIN Place 5 drops into both ears daily.   rosuvastatin 10 MG tablet Commonly known as: CRESTOR  Take 10 mg by mouth daily.   senna-docusate 8.6-50 MG tablet Commonly known as: Senokot-S Take 2 tablets by mouth at bedtime.   sertraline 100 MG tablet Commonly known as: ZOLOFT Take 1 tablet (100 mg total) by mouth daily.   sevelamer carbonate 800 MG tablet Commonly known as: RENVELA Take 800 mg by mouth 3 (three) times daily.   sodium bicarbonate 650 MG tablet Take 1 tablet (650 mg total) by mouth 3 (three) times daily.               Discharge Care Instructions  (From admission, onward)           Start     Ordered   01/09/22 0000  Discharge wound care:       Comments: As above    01/09/22 1424            Discharge Assessment: Vitals:   01/09/22 1011 01/09/22 1358  BP: (!) 113/51 (!) 97/58  Pulse: 88 88  Resp:  17  Temp:  97.7 F (36.5 C)  SpO2:  97%   Skin clean, dry and intact without evidence of skin break down, no evidence of skin tears noted. IV catheter discontinued intact. Site without signs and symptoms of complications - no redness or edema noted at insertion site, patient denies c/o pain - only slight tenderness at site.  Dressing with slight pressure applied.  D/c Instructions-Education: Discharge instructions given to patient/family with verbalized understanding. D/c education completed with patient/family including follow up instructions, medication list, d/c activities limitations if indicated, with other d/c instructions as indicated by MD - patient able to verbalize understanding, all questions fully answered. Patient instructed to return to ED, call 911, or call MD for any changes in condition.  Patient escorted via [place], and D/C home via private auto.  Dorcas Mcmurray, LPN 02/01/1223 8:25 PM

## 2022-01-09 NOTE — Discharge Summary (Signed)
Physician Discharge Summary   Patient: [name] [name] MRN: [id number] DOB: 10-Jul-1934  Admit date:     01/07/2022  Discharge date: 01/09/22  Discharge Physician: Roxan Hockey   PCP: [name], MD   Recommendations at discharge:  -1)Very low-salt diet advised--less than 2 gm 2)Weigh yourself daily, call if you gain more than 3 pounds in 1 day or more than 5 pounds in 1 week as your hemodialysis schedule may need to be adjusted 3)Limit your Fluid  intake to no more than 60 ounces (1.8 Liters) per day 4) continue hemodialysis on Tuesday Thursdays and Saturday  Discharge Diagnoses: Principal Problem:   SOB (shortness of breath) Active Problems:   Paroxysmal A-fib (HCC)   ESRD (end stage renal disease) (HCC)   CAD (coronary artery disease)   Essential hypertension   Hyperlipidemia   Anemia in chronic kidney disease   Personal history of prostate cancer   Chronic systolic CHF (congestive heart failure) (HCC)   Pressure injury of skin  Resolved Problems:   Anemia   Chronic diastolic CHF (congestive heart failure) [facility])   Hospital Course: [name] [name] is a 88 year old male with history of ESRD -recently started on hemodialysis( TTS), HTN, HLD, GERD, Systolic CHF, Depression, h/o hematuria, Chronic anemia, Chronic hyponatremia, Multiple hospitalizations at [facility].  Multiple ED visits.AV graft Rt arm, He has received EPO all injection as an outpatient. Presented today with chief complaint of shortness of breath.  Progressively worsened after hemodialysis on Saturday. Denies any chest pain, weight gain, weight loss with hemodialysis 0.5 lbs   ED:  Blood pressure (!) 127/51, pulse 96, temperature 98.6 F (37 C), temperature source Oral, resp. rate (!) 22, height 5\' 8"  (1.727 m), weight 77.1 kg, SpO2 98 %.  -Was found in A-fib heart rate 69-1 04 currently 96 - LABS: Hemoglobin 7.0, sodium 134, potassium 3.4, chloride 92, BUN 36 creatinine 4.58, BNP1,222 -Elevated D-dimer  3.78, INR 1.2, -SARS-CoV-2, influenza A/B all negative -Chest x-ray reviewed increased small bilateral pleural effusion -EKG with a heart rate of 97, and A-fib (reviewing records January, 26 2023 was also in A-fib)     Assessment and Plan: * SOB (shortness of breath)- (present on admission) - Likely due to volume overload -Obvious lower extremity pitting edema, -No hypoxia, continue to use hemodialysis to address volume  overload status and dyspnea -= Overall much improved  ESRD (end stage renal disease) ([place])- (present on admission) -Per patient started on hemodialysis 1 week PTA -Status post 3 session of hemodialysis, last on Saturday PTA -Prior long history of kidney failure  - Patient has a Lt arm AV graft > 2 years -Nephrology input appreciated okay to continue outpatient hemodialysis on 01/10/22  Paroxysmal A-fib ([place])- (present on admission) -Likely exacerbated by anemia, -CHADs Vas score > 3 - Seems to be new onset A-fib, although multiple EKGs from January was reviewed revealing atrial fibrillation -Rate control, and chronic anticoagulation was discussed with the patient and POA his son on the phone.... They expressed understanding and agreement plan -Continue Eliquis 2.5 mg twice daily for stroke prophylaxis -Coreg discontinued due to soft BP, will use metoprolol 25 mg twice daily for rate control -Echo from 05/07/2021 was reviewed, ejection fraction 55-60% -Cardiac enzymes negative x2  Pressure injury of skin- (present on admission) - Wound care as advised  Chronic systolic CHF (congestive heart failure) ([place])- (present on admission) -?  Chronic diastolic heart failure -Last echocardiogram reviewed from 05/21/2021; Normal Left ventricular ejection fraction, by estimation, is 55  to 60% -Significant elevated proBNP not reliable due toCKD -Weight not reliable due to CKD,  Coreg discontinued due to soft BP, will use metoprolol 25 mg twice daily for rate control -Continue to  use hemodialysis to address volume status  Personal history of prostate cancer - In remission to follow-up as an outpatient   Anemia in chronic kidney disease- (present on admission) - Acute on chronic anemia of chronickidney disease with iron deficiency anemia -Per caregiver patient and his POA son he has anemia of chronic disease, iron deficiency anemia, apparently gets Epogen shots as an outpatient -Hemoglobin 8.7-9 (January 26 was 8.8) -Reports of no hematuria or hematochezia -hgb improved significantly after transfusion, ESA and iron infusion  Hyperlipidemia- (present on admission) - Continue Crestor  Essential hypertension- (present on admission) -BP is very soft, stop amlodipine, stop hydralazine, stop Lasix, patient ultimately make much urine -Coreg discontinued due to soft BP, will use metoprolol 25 mg twice daily for rate control -- Start midodrine on hemodialysis days  CAD (coronary artery disease)- (present on admission) Chest pain-free -Coreg discontinued due to soft BP, will use metoprolol 25 mg twice daily for rate control, continue Crestor   Chronic diastolic CHF (congestive heart failure) (HCC)-resolved as of 01/07/2022, (present on admission) .      Consultants: Nephrology Procedures performed: Hemodialysis Disposition: Home Diet recommendation:  Discharge Diet Orders (From admission, onward)     Start     Ordered   01/09/22 0000  Diet - low sodium heart healthy        01/09/22 1424           Cardiac diet  DISCHARGE MEDICATION: Allergies as of 01/09/2022       Reactions   Ivp Dye [iodinated Contrast Media]    Due to partial renal failure   Nalbuphine Other (See Comments)   PATIENT STATES IT MADE HIM FEEL LIKE HE WAS GOING TO DIE. NO SPECIFICS   Codeine Anxiety        Medication List     STOP taking these medications    amLODipine 10 MG tablet Commonly known as: NORVASC   amoxicillin-clavulanate 875-125 MG tablet Commonly known as:  AUGMENTIN   carvedilol 6.25 MG tablet Commonly known as: COREG   cefdinir 300 MG capsule Commonly known as: OMNICEF   finasteride 5 MG tablet Commonly known as: PROSCAR   furosemide 20 MG tablet Commonly known as: LASIX   tamsulosin 0.4 MG Caps capsule Commonly known as: FLOMAX   traMADol 50 MG tablet Commonly known as: Ultram       TAKE these medications    acetaminophen 500 MG tablet Commonly known as: TYLENOL Take 1,000 mg by mouth every 8 (eight) hours as needed for mild pain or moderate pain. For headache pain What changed: Another medication with the same name was added. Make sure you understand how and when to take each.   acetaminophen 325 MG tablet Commonly known as: TYLENOL Take 2 tablets (650 mg total) by mouth every 6 (six) hours as needed for mild pain (or Fever >/= 101). What changed: You were already taking a medication with the same name, and this prescription was added. Make sure you understand how and when to take each.   albuterol 108 (90 Base) MCG/ACT inhaler Commonly known as: VENTOLIN HFA Inhale 2 puffs into the lungs every 6 (six) hours as needed for wheezing or shortness of breath (cough). What changed:  how much to take reasons to take this   allopurinol 100 MG  tablet Commonly known as: ZYLOPRIM Take 100 mg by mouth daily.   ALPRAZolam 0.5 MG tablet Commonly known as: XANAX Take 1 tablet (0.5 mg total) by mouth 2 (two) times daily as needed for anxiety.   apixaban 2.5 MG Tabs tablet Commonly known as: ELIQUIS Take 1 tablet (2.5 mg total) by mouth 2 (two) times daily.   ARIPiprazole 20 MG tablet Commonly known as: ABILIFY Take 1 tablet (20 mg total) by mouth daily.   epoetin alfa 3000 UNIT/ML injection Commonly known as: EPOGEN Inject 3,000 Units into the skin every 14 (fourteen) days.   fish oil-omega-3 fatty acids 1000 MG capsule Take 1 g by mouth daily.   lansoprazole 15 MG capsule Commonly known as: PREVACID Take 1 capsule  (15 mg total) by mouth 2 (two) times daily before a meal.   metoprolol tartrate 25 MG tablet Commonly known as: LOPRESSOR Take 1 tablet (25 mg total) by mouth 2 (two) times daily.   midodrine 5 MG tablet Commonly known as: PROAMATINE Take 1 tablet (5 mg total) by mouth every Tuesday, Thursday, Saturday, and Sunday. Take before hemodialysis Start taking on: January 10, 2022   multivitamin with minerals Tabs tablet Take 1 tablet by mouth daily.   ofloxacin 0.3 % OTIC solution Commonly known as: FLOXIN Place 5 drops into both ears daily.   rosuvastatin 10 MG tablet Commonly known as: CRESTOR Take 10 mg by mouth daily.   senna-docusate 8.6-50 MG tablet Commonly known as: Senokot-S Take 2 tablets by mouth at bedtime.   sertraline 100 MG tablet Commonly known as: ZOLOFT Take 1 tablet (100 mg total) by mouth daily.   sevelamer carbonate 800 MG tablet Commonly known as: RENVELA Take 800 mg by mouth 3 (three) times daily.   sodium bicarbonate 650 MG tablet Take 1 tablet (650 mg total) by mouth 3 (three) times daily.               Discharge Care Instructions  (From admission, onward)           Start     Ordered   01/09/22 0000  Discharge wound care:       Comments: As above   01/09/22 1424            Follow-up Information     [facility] Follow [facility].   Specialty: Home Health Services Why: They will call to schedule your home visit.                Discharge Exam: Filed Weights   01/08/22 1000 01/09/22 0601 01/09/22 0821  Weight: 80.9 kg 81.8 kg 81.2 kg   Condition at discharge: good  The results of significant diagnostics from this hospitalization (including imaging, microbiology, ancillary and laboratory) are listed below for reference.   Imaging Studies: NM Pulmonary Perfusion  Result Date: 01/07/2022 CLINICAL DATA:  Chest pain for several days EXAM: NUCLEAR MEDICINE PERFUSION LUNG SCAN TECHNIQUE: Perfusion images were  obtained in multiple projections after intravenous injection of radiopharmaceutical. Ventilation scans intentionally deferred if perfusion scan and chest x-ray adequate for interpretation during COVID 19 epidemic. RADIOPHARMACEUTICALS:  4.1 mCi Tc-54m MAA IV COMPARISON:  01/07/2022 FINDINGS: Planar images of the chest are obtained during the perfusion phase of the exam. There are no wedge-shaped perfusion defects to suggest pulmonary embolus. Decreased radiotracer uptake within the left lung base consistent with cardiomegaly and left basilar consolidation and/or effusions seen on corresponding chest x-ray. IMPRESSION: 1. No evidence of acute pulmonary embolus. Electronically Signed  By: Randa Ngo M.D.   On: 01/07/2022 15:27   DG Chest Port 1 View  Result Date: 01/07/2022 CLINICAL DATA:  Shortness of breath. EXAM: PORTABLE CHEST 1 VIEW COMPARISON:  December 27, 2021. FINDINGS: Stable cardiomegaly. Small bilateral pleural effusions are noted with associated bibasilar atelectasis or edema. Old left clavicular fracture is noted. IMPRESSION: Small bilateral pleural effusions with associated bibasilar atelectasis or edema. Electronically Signed   By: Marijo Conception M.D.   On: 01/07/2022 11:57   DG Chest Port 1 View  Result Date: 12/27/2021 CLINICAL DATA:  Cough and weight gain.  Chronic renal failure. EXAM: PORTABLE CHEST 1 VIEW COMPARISON:  08/23/2021 FINDINGS: The cardiac silhouette is mildly enlarged and is more prominent than on the prior study. Aortic atherosclerosis is noted. There is chronic coarsening of the interstitial markings. No definite pulmonary edema, focal airspace consolidation, or pneumothorax is identified. Small bilateral pleural effusions are questioned. There is a remote left clavicle fracture. IMPRESSION: Cardiomegaly and possible small pleural effusions. Electronically Signed   By: Logan Bores M.D.   On: 12/27/2021 11:46    Microbiology: Results for orders placed or performed  during the hospital encounter of 01/07/22  Resp Panel by RT-PCR (Flu A&B, Covid) Nasopharyngeal Swab     Status: None   Collection Time: 01/07/22 11:12 AM   Specimen: Nasopharyngeal Swab; Nasopharyngeal(NP) swabs in vial transport medium  Result Value Ref Range Status   SARS Coronavirus 2 by RT PCR NEGATIVE NEGATIVE Final    Comment: (NOTE) SARS-CoV-2 target nucleic acids are NOT DETECTED.  The SARS-CoV-2 RNA is generally detectable in upper respiratory specimens during the acute phase of infection. The lowest concentration of SARS-CoV-2 viral copies this assay can detect is 138 copies/mL. A negative result does not preclude SARS-Cov-2 infection and should not be used as the sole basis for treatment or other patient management decisions. A negative result may occur with  improper specimen collection/handling, submission of specimen other than nasopharyngeal swab, presence of viral mutation(s) within the areas targeted by this assay, and inadequate number of viral copies(<138 copies/mL). A negative result must be combined with clinical observations, patient history, and epidemiological information. The expected result is Negative.  Fact Sheet for Patients:  EntrepreneurPulse.com.au  Fact Sheet for Healthcare Providers:  IncredibleEmployment.be  This test is no t yet approved or cleared by the [place] FDA and  has been authorized for detection and/or diagnosis of SARS-CoV-2 by FDA under an Emergency Use Authorization (EUA). This EUA will remain  in effect (meaning this test can be used) for the duration of the COVID-19 declaration under Section 564(b)(1) of the Act, 21 U.S.C.section 360bbb-3(b)(1), unless the authorization is terminated  or revoked sooner.       Influenza A by PCR NEGATIVE NEGATIVE Final   Influenza B by PCR NEGATIVE NEGATIVE Final    Comment: (NOTE) The Xpert Xpress SARS-CoV-2/FLU/RSV plus assay is intended as an  aid in the diagnosis of influenza from Nasopharyngeal swab specimens and should not be used as a sole basis for treatment. Nasal washings and aspirates are unacceptable for Xpert Xpress SARS-CoV-2/FLU/RSV testing.  Fact Sheet for Patients: EntrepreneurPulse.com.au  Fact Sheet for Healthcare Providers: IncredibleEmployment.be  This test is not yet approved or cleared by the [place] FDA and has been authorized for detection and/or diagnosis of SARS-CoV-2 by FDA under an Emergency Use Authorization (EUA). This EUA will remain in effect (meaning this test can be used) for the duration of the COVID-19 declaration under Section 564(b)(1)  of the Act, 21 U.S.C. section 360bbb-3(b)(1), unless the authorization is terminated or revoked.  Performed at [facility], [address] [place], [place] [address]     Labs: CBC: Recent Labs  Lab 01/07/22 1118 01/07/22 1612 01/08/22 0555 01/09/22 0529  WBC 9.8  --  8.3 8.7  NEUTROABS 8.4*  --   --   --   HGB 7.0* 7.4* 10.3* 9.8*  HCT 22.8* 23.6* 30.8* 30.8*  MCV 105.1*  --  96.3 97.5  PLT 191  --  160 449   Basic Metabolic Panel: Recent Labs  Lab 01/07/22 1118 01/08/22 0555 01/09/22 0529  NA 134* 133* 132*  K 3.4* 3.3* 3.2*  CL 92* 95* 95*  CO2 29 27 29   GLUCOSE 135* 103* 101*  BUN 36* 41* 28*  CREATININE 4.58* 4.76* 3.72*  CALCIUM 8.5* 8.4* 8.5*   Liver Function Tests: Recent Labs  Lab 01/07/22 1118  AST 22  ALT 15  ALKPHOS 70  BILITOT 0.3  PROT 7.4  ALBUMIN 3.0*   CBG: Recent Labs  Lab 01/08/22 0823 01/09/22 0724  GLUCAP 106* 105*    Discharge time spent: greater than 30 minutes.  Signed: Roxan Hockey, MD [facility] 01/09/2022

## 2022-01-09 NOTE — Care Management Important Message (Signed)
Important Message  Patient Details  Name: [name] [name] MRN: [id number] Date of Birth: 1934-11-02   Medicare Important Message Given:  N/A - LOS <3 / Initial given by admissions     Tommy Medal 01/09/2022, 10:47 AM

## 2022-01-10 DIAGNOSIS — Z9981 Dependence on supplemental oxygen: Secondary | ICD-10-CM | POA: Diagnosis not present

## 2022-01-10 DIAGNOSIS — Z86718 Personal history of other venous thrombosis and embolism: Secondary | ICD-10-CM | POA: Diagnosis not present

## 2022-01-10 DIAGNOSIS — Z7901 Long term (current) use of anticoagulants: Secondary | ICD-10-CM | POA: Diagnosis not present

## 2022-01-10 DIAGNOSIS — I132 Hypertensive heart and chronic kidney disease with heart failure and with stage 5 chronic kidney disease, or end stage renal disease: Secondary | ICD-10-CM | POA: Diagnosis not present

## 2022-01-10 DIAGNOSIS — Z87891 Personal history of nicotine dependence: Secondary | ICD-10-CM | POA: Diagnosis not present

## 2022-01-10 DIAGNOSIS — E785 Hyperlipidemia, unspecified: Secondary | ICD-10-CM | POA: Diagnosis not present

## 2022-01-10 DIAGNOSIS — F419 Anxiety disorder, unspecified: Secondary | ICD-10-CM | POA: Diagnosis not present

## 2022-01-10 DIAGNOSIS — I5042 Chronic combined systolic (congestive) and diastolic (congestive) heart failure: Secondary | ICD-10-CM | POA: Diagnosis not present

## 2022-01-10 DIAGNOSIS — D631 Anemia in chronic kidney disease: Secondary | ICD-10-CM | POA: Diagnosis not present

## 2022-01-10 DIAGNOSIS — Z9181 History of falling: Secondary | ICD-10-CM | POA: Diagnosis not present

## 2022-01-10 DIAGNOSIS — N2581 Secondary hyperparathyroidism of renal origin: Secondary | ICD-10-CM | POA: Diagnosis not present

## 2022-01-10 DIAGNOSIS — L89151 Pressure ulcer of sacral region, stage 1: Secondary | ICD-10-CM | POA: Diagnosis not present

## 2022-01-10 DIAGNOSIS — F32A Depression, unspecified: Secondary | ICD-10-CM | POA: Diagnosis not present

## 2022-01-10 DIAGNOSIS — Z992 Dependence on renal dialysis: Secondary | ICD-10-CM | POA: Diagnosis not present

## 2022-01-10 DIAGNOSIS — I480 Paroxysmal atrial fibrillation: Secondary | ICD-10-CM | POA: Diagnosis not present

## 2022-01-10 DIAGNOSIS — M1030 Gout due to renal impairment, unspecified site: Secondary | ICD-10-CM | POA: Diagnosis not present

## 2022-01-10 DIAGNOSIS — K219 Gastro-esophageal reflux disease without esophagitis: Secondary | ICD-10-CM | POA: Diagnosis not present

## 2022-01-10 DIAGNOSIS — Z8546 Personal history of malignant neoplasm of prostate: Secondary | ICD-10-CM | POA: Diagnosis not present

## 2022-01-10 DIAGNOSIS — M1990 Unspecified osteoarthritis, unspecified site: Secondary | ICD-10-CM | POA: Diagnosis not present

## 2022-01-10 DIAGNOSIS — I2510 Atherosclerotic heart disease of native coronary artery without angina pectoris: Secondary | ICD-10-CM | POA: Diagnosis not present

## 2022-01-10 DIAGNOSIS — N186 End stage renal disease: Secondary | ICD-10-CM | POA: Diagnosis not present

## 2022-01-10 DIAGNOSIS — E871 Hypo-osmolality and hyponatremia: Secondary | ICD-10-CM | POA: Diagnosis not present

## 2022-01-11 DIAGNOSIS — F418 Other specified anxiety disorders: Secondary | ICD-10-CM | POA: Diagnosis not present

## 2022-01-11 DIAGNOSIS — D649 Anemia, unspecified: Secondary | ICD-10-CM | POA: Diagnosis not present

## 2022-01-11 DIAGNOSIS — I509 Heart failure, unspecified: Secondary | ICD-10-CM | POA: Diagnosis not present

## 2022-01-11 DIAGNOSIS — I10 Essential (primary) hypertension: Secondary | ICD-10-CM | POA: Diagnosis not present

## 2022-01-11 DIAGNOSIS — M109 Gout, unspecified: Secondary | ICD-10-CM | POA: Diagnosis not present

## 2022-01-11 DIAGNOSIS — E785 Hyperlipidemia, unspecified: Secondary | ICD-10-CM | POA: Diagnosis not present

## 2022-01-11 DIAGNOSIS — N400 Enlarged prostate without lower urinary tract symptoms: Secondary | ICD-10-CM | POA: Diagnosis not present

## 2022-01-11 DIAGNOSIS — I482 Chronic atrial fibrillation, unspecified: Secondary | ICD-10-CM | POA: Diagnosis not present

## 2022-01-11 DIAGNOSIS — G894 Chronic pain syndrome: Secondary | ICD-10-CM | POA: Diagnosis not present

## 2022-01-11 DIAGNOSIS — N185 Chronic kidney disease, stage 5: Secondary | ICD-10-CM | POA: Diagnosis not present

## 2022-01-12 DIAGNOSIS — N186 End stage renal disease: Secondary | ICD-10-CM | POA: Diagnosis not present

## 2022-01-12 DIAGNOSIS — N2581 Secondary hyperparathyroidism of renal origin: Secondary | ICD-10-CM | POA: Diagnosis not present

## 2022-01-12 DIAGNOSIS — D631 Anemia in chronic kidney disease: Secondary | ICD-10-CM | POA: Diagnosis not present

## 2022-01-12 DIAGNOSIS — Z992 Dependence on renal dialysis: Secondary | ICD-10-CM | POA: Diagnosis not present

## 2022-01-13 ENCOUNTER — Emergency Department (HOSPITAL_COMMUNITY)
Admission: EM | Admit: 2022-01-13 | Discharge: 2022-01-13 | Disposition: A | Discharge status: Home / Self Care | Payer: MEDICARE | Source: Home / Self Care | Attending: Emergency Medicine | Admitting: Emergency Medicine

## 2022-01-13 ENCOUNTER — Emergency Department (HOSPITAL_COMMUNITY): Payer: MEDICARE

## 2022-01-13 ENCOUNTER — Other Ambulatory Visit: Payer: Self-pay

## 2022-01-13 ENCOUNTER — Encounter (HOSPITAL_COMMUNITY): Payer: Self-pay

## 2022-01-13 DIAGNOSIS — Z992 Dependence on renal dialysis: Secondary | ICD-10-CM | POA: Insufficient documentation

## 2022-01-13 DIAGNOSIS — R54 Age-related physical debility: Secondary | ICD-10-CM | POA: Diagnosis present

## 2022-01-13 DIAGNOSIS — Z7189 Other specified counseling: Secondary | ICD-10-CM | POA: Diagnosis not present

## 2022-01-13 DIAGNOSIS — M109 Gout, unspecified: Secondary | ICD-10-CM | POA: Diagnosis present

## 2022-01-13 DIAGNOSIS — R627 Adult failure to thrive: Secondary | ICD-10-CM | POA: Diagnosis present

## 2022-01-13 DIAGNOSIS — N2581 Secondary hyperparathyroidism of renal origin: Secondary | ICD-10-CM | POA: Diagnosis not present

## 2022-01-13 DIAGNOSIS — N186 End stage renal disease: Secondary | ICD-10-CM | POA: Insufficient documentation

## 2022-01-13 DIAGNOSIS — I248 Other forms of acute ischemic heart disease: Secondary | ICD-10-CM | POA: Diagnosis present

## 2022-01-13 DIAGNOSIS — L89151 Pressure ulcer of sacral region, stage 1: Secondary | ICD-10-CM | POA: Diagnosis not present

## 2022-01-13 DIAGNOSIS — R0602 Shortness of breath: Secondary | ICD-10-CM

## 2022-01-13 DIAGNOSIS — I509 Heart failure, unspecified: Secondary | ICD-10-CM | POA: Diagnosis not present

## 2022-01-13 DIAGNOSIS — I5042 Chronic combined systolic (congestive) and diastolic (congestive) heart failure: Secondary | ICD-10-CM | POA: Diagnosis not present

## 2022-01-13 DIAGNOSIS — J90 Pleural effusion, not elsewhere classified: Secondary | ICD-10-CM | POA: Insufficient documentation

## 2022-01-13 DIAGNOSIS — D649 Anemia, unspecified: Secondary | ICD-10-CM | POA: Diagnosis not present

## 2022-01-13 DIAGNOSIS — I132 Hypertensive heart and chronic kidney disease with heart failure and with stage 5 chronic kidney disease, or end stage renal disease: Secondary | ICD-10-CM | POA: Diagnosis not present

## 2022-01-13 DIAGNOSIS — I480 Paroxysmal atrial fibrillation: Secondary | ICD-10-CM | POA: Diagnosis not present

## 2022-01-13 DIAGNOSIS — I2510 Atherosclerotic heart disease of native coronary artery without angina pectoris: Secondary | ICD-10-CM | POA: Diagnosis not present

## 2022-01-13 DIAGNOSIS — E876 Hypokalemia: Secondary | ICD-10-CM | POA: Diagnosis not present

## 2022-01-13 DIAGNOSIS — I120 Hypertensive chronic kidney disease with stage 5 chronic kidney disease or end stage renal disease: Secondary | ICD-10-CM | POA: Diagnosis not present

## 2022-01-13 DIAGNOSIS — N189 Chronic kidney disease, unspecified: Secondary | ICD-10-CM | POA: Diagnosis not present

## 2022-01-13 DIAGNOSIS — E8779 Other fluid overload: Secondary | ICD-10-CM | POA: Diagnosis not present

## 2022-01-13 DIAGNOSIS — Z515 Encounter for palliative care: Secondary | ICD-10-CM | POA: Diagnosis not present

## 2022-01-13 DIAGNOSIS — E785 Hyperlipidemia, unspecified: Secondary | ICD-10-CM | POA: Diagnosis not present

## 2022-01-13 DIAGNOSIS — Z7901 Long term (current) use of anticoagulants: Secondary | ICD-10-CM | POA: Insufficient documentation

## 2022-01-13 DIAGNOSIS — Z66 Do not resuscitate: Secondary | ICD-10-CM | POA: Diagnosis present

## 2022-01-13 DIAGNOSIS — D631 Anemia in chronic kidney disease: Secondary | ICD-10-CM | POA: Diagnosis not present

## 2022-01-13 DIAGNOSIS — Z79899 Other long term (current) drug therapy: Secondary | ICD-10-CM | POA: Insufficient documentation

## 2022-01-13 DIAGNOSIS — R531 Weakness: Secondary | ICD-10-CM | POA: Diagnosis not present

## 2022-01-13 DIAGNOSIS — K219 Gastro-esophageal reflux disease without esophagitis: Secondary | ICD-10-CM | POA: Diagnosis present

## 2022-01-13 DIAGNOSIS — I4891 Unspecified atrial fibrillation: Secondary | ICD-10-CM | POA: Diagnosis not present

## 2022-01-13 DIAGNOSIS — D7589 Other specified diseases of blood and blood-forming organs: Secondary | ICD-10-CM | POA: Diagnosis present

## 2022-01-13 DIAGNOSIS — Z8546 Personal history of malignant neoplasm of prostate: Secondary | ICD-10-CM | POA: Insufficient documentation

## 2022-01-13 DIAGNOSIS — F32A Depression, unspecified: Secondary | ICD-10-CM | POA: Diagnosis present

## 2022-01-13 DIAGNOSIS — D539 Nutritional anemia, unspecified: Secondary | ICD-10-CM | POA: Diagnosis present

## 2022-01-13 DIAGNOSIS — E872 Acidosis, unspecified: Secondary | ICD-10-CM | POA: Diagnosis present

## 2022-01-13 DIAGNOSIS — I5022 Chronic systolic (congestive) heart failure: Secondary | ICD-10-CM | POA: Diagnosis not present

## 2022-01-13 DIAGNOSIS — I3139 Other pericardial effusion (noninflammatory): Secondary | ICD-10-CM | POA: Diagnosis not present

## 2022-01-13 DIAGNOSIS — I4892 Unspecified atrial flutter: Secondary | ICD-10-CM | POA: Diagnosis present

## 2022-01-13 DIAGNOSIS — I5043 Acute on chronic combined systolic (congestive) and diastolic (congestive) heart failure: Secondary | ICD-10-CM | POA: Diagnosis present

## 2022-01-13 DIAGNOSIS — I5041 Acute combined systolic (congestive) and diastolic (congestive) heart failure: Secondary | ICD-10-CM | POA: Diagnosis not present

## 2022-01-13 DIAGNOSIS — Z20822 Contact with and (suspected) exposure to covid-19: Secondary | ICD-10-CM | POA: Diagnosis present

## 2022-01-13 DIAGNOSIS — N250 Renal osteodystrophy: Secondary | ICD-10-CM | POA: Diagnosis not present

## 2022-01-13 DIAGNOSIS — I5033 Acute on chronic diastolic (congestive) heart failure: Secondary | ICD-10-CM | POA: Diagnosis not present

## 2022-01-13 DIAGNOSIS — E8770 Fluid overload, unspecified: Secondary | ICD-10-CM | POA: Diagnosis not present

## 2022-01-13 DIAGNOSIS — R778 Other specified abnormalities of plasma proteins: Secondary | ICD-10-CM | POA: Diagnosis not present

## 2022-01-13 DIAGNOSIS — F419 Anxiety disorder, unspecified: Secondary | ICD-10-CM | POA: Diagnosis present

## 2022-01-13 LAB — CBC WITH DIFFERENTIAL/PLATELET
Abs Immature Granulocytes: 0.05 10*3/uL (ref 0.00–0.07)
Basophils Absolute: 0 10*3/uL (ref 0.0–0.1)
Basophils Relative: 0 %
Eosinophils Absolute: 0 10*3/uL (ref 0.0–0.5)
Eosinophils Relative: 0 %
HCT: 32.4 % — ABNORMAL LOW (ref 39.0–52.0)
Hemoglobin: 10.1 g/dL — ABNORMAL LOW (ref 13.0–17.0)
Immature Granulocytes: 1 %
Lymphocytes Relative: 5 %
Lymphs Abs: 0.5 10*3/uL — ABNORMAL LOW (ref 0.7–4.0)
MCH: 31.9 pg (ref 26.0–34.0)
MCHC: 31.2 g/dL (ref 30.0–36.0)
MCV: 102.2 fL — ABNORMAL HIGH (ref 80.0–100.0)
Monocytes Absolute: 0.7 10*3/uL (ref 0.1–1.0)
Monocytes Relative: 8 %
Neutro Abs: 8.2 10*3/uL — ABNORMAL HIGH (ref 1.7–7.7)
Neutrophils Relative %: 86 %
Platelets: 213 10*3/uL (ref 150–400)
RBC: 3.17 MIL/uL — ABNORMAL LOW (ref 4.22–5.81)
RDW: 15.9 % — ABNORMAL HIGH (ref 11.5–15.5)
WBC: 9.5 10*3/uL (ref 4.0–10.5)
nRBC: 0.2 % (ref 0.0–0.2)

## 2022-01-13 LAB — COMPREHENSIVE METABOLIC PANEL
ALT: 27 U/L (ref 0–44)
AST: 33 U/L (ref 15–41)
Albumin: 2.9 g/dL — ABNORMAL LOW (ref 3.5–5.0)
Alkaline Phosphatase: 88 U/L (ref 38–126)
Anion gap: 8 (ref 5–15)
BUN: 35 mg/dL — ABNORMAL HIGH (ref 8–23)
CO2: 30 mmol/L (ref 22–32)
Calcium: 8.3 mg/dL — ABNORMAL LOW (ref 8.9–10.3)
Chloride: 98 mmol/L (ref 98–111)
Creatinine, Ser: 4.6 mg/dL — ABNORMAL HIGH (ref 0.61–1.24)
GFR, Estimated: 12 mL/min — ABNORMAL LOW (ref >60)
Glucose, Bld: 135 mg/dL — ABNORMAL HIGH (ref 70–99)
Potassium: 3.4 mmol/L — ABNORMAL LOW (ref 3.5–5.1)
Sodium: 136 mmol/L (ref 135–145)
Total Bilirubin: 0.4 mg/dL (ref 0.3–1.2)
Total Protein: 7.5 g/dL (ref 6.5–8.1)

## 2022-01-13 LAB — BRAIN NATRIURETIC PEPTIDE: B Natriuretic Peptide: 1282 pg/mL — ABNORMAL HIGH (ref 0.0–100.0)

## 2022-01-13 MED ORDER — METOPROLOL TARTRATE 5 MG/5ML IV SOLN: 5.0000 mg | Freq: Once | INTRAVENOUS | Status: DC

## 2022-01-13 NOTE — ED Provider Notes (Addendum)
[facility] EMERGENCY DEPARTMENT Provider Note   CSN: [id number] Arrival date & time: 01/13/22  1254     History  Chief Complaint  Patient presents with   Shortness of Breath    [name] [name] is a 88 y.o. male.  Patient presenting with a complaint of shortness of breath.  States that he gained 4 pounds overnight.  Patient states that he has been short of breath and no better even since his last hospitalization.  Patient's initial vital signs oxygen sats was 95% on room air.  Heart rate 121 blood pressure 86/57 however repeat brought blood pressure up into the 409W systolic and also patient's heart rate was in the low 90s on the cardiac monitor.  Patient did not appear in any acute distress  Patient is new to dialysis.  Patient is dialyzed through an AV fistula in his left upper extremity.  Patient goes Tuesday Thursday Saturdays.  Was dialyzed yesterday.  Patient was admitted for principal problem of shortness of breath February 2 through February 8.  Also had additional active problems of atrial fibs end-stage renal disease coronary artery disease hypertension hyperlipidemia anemia of chronic kidney disease.  Personal history of prostate cancer chronic systolic congestive heart failure.  Patient's medications are significant for Eliquis Epogen albuterol inhaler Lopressor.  Patient does not appear to be on Lasix or diuretic.      Home Medications Prior to Admission medications   Medication Sig Start Date End Date Taking? Authorizing Provider  acetaminophen (TYLENOL) 325 MG tablet Take 2 tablets (650 mg total) by mouth every 6 (six) hours as needed for mild pain (or Fever >/= 101). 01/09/22   Roxan Hockey, MD  acetaminophen (TYLENOL) 500 MG tablet Take 1,000 mg by mouth every 8 (eight) hours as needed for mild pain or moderate pain. For headache pain    [provider]  albuterol (VENTOLIN HFA) 108 (90 Base) MCG/ACT inhaler Inhale 2 puffs into the lungs every 6 (six) hours  as needed for wheezing or shortness of breath (cough). 01/09/22   Roxan Hockey, MD  allopurinol (ZYLOPRIM) 100 MG tablet Take 100 mg by mouth daily.  10/07/18   [provider]  ALPRAZolam Duanne Moron) 0.5 MG tablet Take 1 tablet (0.5 mg total) by mouth 2 (two) times daily as needed for anxiety. 06/06/20   Roxan Hockey, MD  apixaban (ELIQUIS) 2.5 MG TABS tablet Take 1 tablet (2.5 mg total) by mouth 2 (two) times daily. 01/09/22   Roxan Hockey, MD  ARIPiprazole (ABILIFY) 20 MG tablet Take 1 tablet (20 mg total) by mouth daily. 01/09/22   Roxan Hockey, MD  epoetin alfa (EPOGEN) 3000 UNIT/ML injection Inject 3,000 Units into the skin every 14 (fourteen) days.     [provider]  fish oil-omega-3 fatty acids 1000 MG capsule Take 1 g by mouth daily.    [provider]  lansoprazole (PREVACID) 15 MG capsule Take 1 capsule (15 mg total) by mouth 2 (two) times daily before a meal. 06/06/20   Emokpae, Courage, MD  metoprolol tartrate (LOPRESSOR) 25 MG tablet Take 1 tablet (25 mg total) by mouth 2 (two) times daily. 01/09/22 01/09/23  Roxan Hockey, MD  midodrine (PROAMATINE) 5 MG tablet Take 1 tablet (5 mg total) by mouth every Tuesday, Thursday, Saturday, and Sunday. Take before hemodialysis 01/10/22   Roxan Hockey, MD  Multiple Vitamin (MULTIVITAMIN WITH MINERALS) TABS tablet Take 1 tablet by mouth daily.    [provider]  ofloxacin (FLOXIN) 0.3 % OTIC solution  Place 5 drops into both ears daily. 10/19/18   [provider]  rosuvastatin (CRESTOR) 10 MG tablet Take 10 mg by mouth daily.    [provider]  senna-docusate (SENOKOT-S) 8.6-50 MG tablet Take 2 tablets by mouth at bedtime. 01/09/22 01/09/23  Roxan Hockey, MD  sertraline (ZOLOFT) 100 MG tablet Take 1 tablet (100 mg total) by mouth daily. 06/06/20   Roxan Hockey, MD  sevelamer carbonate (RENVELA) 800 MG tablet Take 800 mg by mouth 3 (three) times daily. 12/28/21   [provider]   sodium bicarbonate 650 MG tablet Take 1 tablet (650 mg total) by mouth 3 (three) times daily. 01/09/22   Roxan Hockey, MD      Allergies    Ivp dye [iodinated contrast media], Nalbuphine, and Codeine    Review of Systems   Review of Systems  Constitutional:  Negative for chills and fever.  HENT:  Negative for ear pain and sore throat.   Eyes:  Negative for pain and visual disturbance.  Respiratory:  Positive for shortness of breath. Negative for cough.   Cardiovascular:  Negative for chest pain and palpitations.  Gastrointestinal:  Negative for abdominal pain and vomiting.  Genitourinary:  Negative for dysuria and hematuria.  Musculoskeletal:  Negative for arthralgias and back pain.  Skin:  Negative for color change and rash.  Neurological:  Negative for seizures and syncope.  All other systems reviewed and are negative.  Physical Exam Updated Vital Signs BP 120/73    Pulse (!) 128    Temp 98 F (36.7 C)    Resp (!) 23    Ht 1.727 m (5\' 8" )    Wt 78.9 kg    SpO2 97%    BMI 26.46 kg/m  Physical Exam Vitals and nursing note reviewed.  Constitutional:      General: He is not in acute distress.    Appearance: Normal appearance. He is well-developed. He is not ill-appearing or toxic-appearing.  HENT:     Head: Normocephalic and atraumatic.  Eyes:     Extraocular Movements: Extraocular movements intact.     Conjunctiva/sclera: Conjunctivae normal.     Pupils: Pupils are equal, round, and reactive to light.  Cardiovascular:     Rate and Rhythm: Normal rate and regular rhythm.     Heart sounds: No murmur heard. Pulmonary:     Effort: Pulmonary effort is normal. No respiratory distress.     Breath sounds: Normal breath sounds. No wheezing, rhonchi or rales.  Abdominal:     Palpations: Abdomen is soft.     Tenderness: There is no abdominal tenderness.  Musculoskeletal:        General: No swelling.     Cervical back: Neck supple.     Right lower leg: No edema.     Left lower  leg: No edema.  Skin:    General: Skin is warm and dry.     Capillary Refill: Capillary refill takes less than 2 seconds.  Neurological:     General: No focal deficit present.     Mental Status: He is alert and oriented to person, place, and time.  Psychiatric:        Mood and Affect: Mood normal.    ED Results / Procedures / Treatments   Labs (all labs ordered are listed, but only abnormal results are displayed) Labs Reviewed  COMPREHENSIVE METABOLIC PANEL - Abnormal; Notable for the following components:      Result Value   Potassium 3.4 (*)  Glucose, Bld 135 (*)    BUN 35 (*)    Creatinine, Ser 4.60 (*)    Calcium 8.3 (*)    Albumin 2.9 (*)    GFR, Estimated 12 (*)    All other components within normal limits  CBC WITH DIFFERENTIAL/PLATELET - Abnormal; Notable for the following components:   RBC 3.17 (*)    Hemoglobin 10.1 (*)    HCT 32.4 (*)    MCV 102.2 (*)    RDW 15.9 (*)    Neutro Abs 8.2 (*)    Lymphs Abs 0.5 (*)    All other components within normal limits  BRAIN NATRIURETIC PEPTIDE - Abnormal; Notable for the following components:   B Natriuretic Peptide 1,282.0 (*)    All other components within normal limits    EKG EKG Interpretation  Date/Time:  Sunday January 13 2022 14:38:07 EST Ventricular Rate:  99 PR Interval:  166 QRS Duration: 96 QT Interval:  360 QTC Calculation: 462 R Axis:   79 Text Interpretation: Sinus tachycardia Ventricular premature complex Nonspecific T abnormalities, diffuse leads Confirmed by Fredia Sorrow [phone]) on 01/13/2022 2:49:48 PM  Radiology DG Chest Port 1 View  Result Date: 01/13/2022 CLINICAL DATA:  Shortness of breath. EXAM: PORTABLE CHEST 1 VIEW COMPARISON:  01/07/2022 FINDINGS: 1421 hours. Retrocardiac left base collapse/consolidation with small to moderate left effusion is similar to prior. Tiny right pleural effusion evident. Interstitial markings are diffusely coarsened with chronic features. Telemetry leads  overlie the chest. IMPRESSION: 1. Stable exam. Left base collapse/consolidation with left effusion. 2. Tiny right pleural effusion. Electronically Signed   By: Misty Stanley M.D.   On: 01/13/2022 15:03    Procedures Procedures  Cardiac monitoring is a sinus rhythm.  No evidence of atrial fibrillation or any significant arrhythmias.  Medications Ordered in ED Medications  metoprolol tartrate (LOPRESSOR) injection 5 mg (has no administration in time range)    ED Course/ Medical Decision Making/ A&P                           Medical Decision Making Amount and/or Complexity of Data Reviewed Labs: ordered. Radiology: ordered.  Risk Prescription drug management.  CRITICAL CARE Performed by: Fredia Sorrow Total critical care time: 35 minutes Critical care time was exclusive of separately billable procedures and treating other patients. Critical care was necessary to treat or prevent imminent or life-threatening deterioration. Critical care was time spent personally by me on the following activities: development of treatment plan with patient and/or surrogate as well as nursing, discussions with consultants, evaluation of patient's response to treatment, examination of patient, obtaining history from patient or surrogate, ordering and performing treatments and interventions, ordering and review of laboratory studies, ordering and review of radiographic studies, pulse oximetry and re-evaluation of patient's condition.  Patient clinically looks quite good.  No swelling in the lower extremities oxygen saturations 96% or better.  Heart rate is in the 90s.  But it is a sinus rhythm.  Does not currently atrial fib.  Initial blood pressure was recorded as low but subsequent blood pressures have been fine.  Currently 284 systolic.  Suspect patient may be feeling bad secondary to dialysis.  He is just not used to it yet.  Patient's lungs are clear no evidence of any significant fluid overload.  Not  hypoxic.  Patient's complete metabolic panel is back potassium 3.4 glucose 135.  White blood cell count is 9.5 hemoglobin 10.1.  So no significant anemia.  Patient is on the Epogen.  BNP is pending.  But with him being a dialysis patient this may not be of too much help.  Chest x-ray still pending.  EKG showed sinus rhythm with occasional PVC.  Chest x-ray is still pending.  We will ambulate to see if there is a significant decrease in his oxygen levels.  Also spoke with patient's son and updated him to what we knew so far.  Patient ambulated quite well.  Oxygen sats did not drop below 90.  He did get a little bit short of breath.  We got him back to the bed his heart rate was up to 140.  Right before walking him he had switched over to atrial fibrillation.  Supposed to take his Lopressor twice a day.  So due for a dose.  With some rest heart rate would come down into the 1 teens.  We will go ahead and give 5 mg of Lopressor IV.  Spoke with patient probably best overall to go home.  Continue with dialysis.  Continue his home therapy.  No acute or significant indications for admission at this time.  Final Clinical Impression(s) / ED Diagnoses Final diagnoses:  SOB (shortness of breath)  ESRD on dialysis [facility])  Pleural effusion    Rx / DC Orders ED Discharge Orders     None         Fredia Sorrow, MD 01/13/22 1450    Fredia Sorrow, MD 01/13/22 1451    Fredia Sorrow, MD 01/13/22 1600

## 2022-01-13 NOTE — Discharge Instructions (Signed)
Follow-up with dialysis as scheduled for Tuesday.  X-ray shows no change in the pleural effusion on the left side.  Take your medications as directed.  Follow-up with your doctors.  Return for any new or worse symptoms.

## 2022-01-13 NOTE — ED Triage Notes (Signed)
Pt arrived to ED for shortness of breath and gained 4 lbs over night.

## 2022-01-13 NOTE — ED Notes (Signed)
Pt walked from room 14 all the way to the end of the nurses station. Which is about 30 feet. Pt"s O2 was at 95% when we walked out of room and stayed between 94% to 98%. And O2 was 98% when he returned to bed. But once in the bed Pt's O2 was 94%.

## 2022-01-13 NOTE — ED Notes (Signed)
ED Provider at bedside. 

## 2022-01-14 DIAGNOSIS — L89151 Pressure ulcer of sacral region, stage 1: Secondary | ICD-10-CM | POA: Diagnosis not present

## 2022-01-14 DIAGNOSIS — N186 End stage renal disease: Secondary | ICD-10-CM | POA: Diagnosis not present

## 2022-01-14 DIAGNOSIS — I5042 Chronic combined systolic (congestive) and diastolic (congestive) heart failure: Secondary | ICD-10-CM | POA: Diagnosis not present

## 2022-01-14 DIAGNOSIS — I132 Hypertensive heart and chronic kidney disease with heart failure and with stage 5 chronic kidney disease, or end stage renal disease: Secondary | ICD-10-CM | POA: Diagnosis not present

## 2022-01-14 DIAGNOSIS — I2510 Atherosclerotic heart disease of native coronary artery without angina pectoris: Secondary | ICD-10-CM | POA: Diagnosis not present

## 2022-01-14 DIAGNOSIS — I480 Paroxysmal atrial fibrillation: Secondary | ICD-10-CM | POA: Diagnosis not present

## 2022-01-15 DIAGNOSIS — Z992 Dependence on renal dialysis: Secondary | ICD-10-CM | POA: Diagnosis not present

## 2022-01-15 DIAGNOSIS — D631 Anemia in chronic kidney disease: Secondary | ICD-10-CM | POA: Diagnosis not present

## 2022-01-15 DIAGNOSIS — N186 End stage renal disease: Secondary | ICD-10-CM | POA: Diagnosis not present

## 2022-01-15 DIAGNOSIS — N2581 Secondary hyperparathyroidism of renal origin: Secondary | ICD-10-CM | POA: Diagnosis not present

## 2022-01-16 ENCOUNTER — Encounter (HOSPITAL_COMMUNITY): Payer: Self-pay

## 2022-01-16 ENCOUNTER — Other Ambulatory Visit: Payer: Self-pay

## 2022-01-16 ENCOUNTER — Inpatient Hospital Stay (HOSPITAL_COMMUNITY)
Admission: EM | Admit: 2022-01-16 | Discharge: 2022-01-21 | DRG: 291 | Disposition: A | Discharge status: Hospice | Payer: MEDICARE | Attending: Internal Medicine | Admitting: Internal Medicine

## 2022-01-16 ENCOUNTER — Emergency Department (HOSPITAL_COMMUNITY): Payer: MEDICARE

## 2022-01-16 DIAGNOSIS — I3139 Other pericardial effusion (noninflammatory): Secondary | ICD-10-CM | POA: Diagnosis present

## 2022-01-16 DIAGNOSIS — R54 Age-related physical debility: Secondary | ICD-10-CM | POA: Diagnosis present

## 2022-01-16 DIAGNOSIS — I5041 Acute combined systolic (congestive) and diastolic (congestive) heart failure: Secondary | ICD-10-CM | POA: Diagnosis not present

## 2022-01-16 DIAGNOSIS — I4891 Unspecified atrial fibrillation: Secondary | ICD-10-CM | POA: Diagnosis not present

## 2022-01-16 DIAGNOSIS — D649 Anemia, unspecified: Secondary | ICD-10-CM | POA: Diagnosis not present

## 2022-01-16 DIAGNOSIS — Z8546 Personal history of malignant neoplasm of prostate: Secondary | ICD-10-CM

## 2022-01-16 DIAGNOSIS — Z7189 Other specified counseling: Secondary | ICD-10-CM | POA: Diagnosis not present

## 2022-01-16 DIAGNOSIS — I5043 Acute on chronic combined systolic (congestive) and diastolic (congestive) heart failure: Secondary | ICD-10-CM | POA: Diagnosis present

## 2022-01-16 DIAGNOSIS — Z87442 Personal history of urinary calculi: Secondary | ICD-10-CM

## 2022-01-16 DIAGNOSIS — I509 Heart failure, unspecified: Principal | ICD-10-CM

## 2022-01-16 DIAGNOSIS — E876 Hypokalemia: Secondary | ICD-10-CM | POA: Diagnosis not present

## 2022-01-16 DIAGNOSIS — R627 Adult failure to thrive: Secondary | ICD-10-CM | POA: Diagnosis present

## 2022-01-16 DIAGNOSIS — N186 End stage renal disease: Secondary | ICD-10-CM | POA: Diagnosis present

## 2022-01-16 DIAGNOSIS — Z955 Presence of coronary angioplasty implant and graft: Secondary | ICD-10-CM

## 2022-01-16 DIAGNOSIS — I5033 Acute on chronic diastolic (congestive) heart failure: Secondary | ICD-10-CM

## 2022-01-16 DIAGNOSIS — N189 Chronic kidney disease, unspecified: Secondary | ICD-10-CM | POA: Diagnosis not present

## 2022-01-16 DIAGNOSIS — I4892 Unspecified atrial flutter: Secondary | ICD-10-CM | POA: Diagnosis present

## 2022-01-16 DIAGNOSIS — N250 Renal osteodystrophy: Secondary | ICD-10-CM | POA: Diagnosis not present

## 2022-01-16 DIAGNOSIS — J90 Pleural effusion, not elsewhere classified: Secondary | ICD-10-CM | POA: Diagnosis not present

## 2022-01-16 DIAGNOSIS — K219 Gastro-esophageal reflux disease without esophagitis: Secondary | ICD-10-CM | POA: Diagnosis present

## 2022-01-16 DIAGNOSIS — Z515 Encounter for palliative care: Secondary | ICD-10-CM

## 2022-01-16 DIAGNOSIS — D631 Anemia in chronic kidney disease: Secondary | ICD-10-CM | POA: Diagnosis present

## 2022-01-16 DIAGNOSIS — F419 Anxiety disorder, unspecified: Secondary | ICD-10-CM | POA: Diagnosis present

## 2022-01-16 DIAGNOSIS — Z66 Do not resuscitate: Secondary | ICD-10-CM | POA: Diagnosis present

## 2022-01-16 DIAGNOSIS — Z992 Dependence on renal dialysis: Secondary | ICD-10-CM

## 2022-01-16 DIAGNOSIS — I132 Hypertensive heart and chronic kidney disease with heart failure and with stage 5 chronic kidney disease, or end stage renal disease: Principal | ICD-10-CM | POA: Diagnosis present

## 2022-01-16 DIAGNOSIS — Z7901 Long term (current) use of anticoagulants: Secondary | ICD-10-CM

## 2022-01-16 DIAGNOSIS — Z79899 Other long term (current) drug therapy: Secondary | ICD-10-CM

## 2022-01-16 DIAGNOSIS — I480 Paroxysmal atrial fibrillation: Secondary | ICD-10-CM | POA: Diagnosis present

## 2022-01-16 DIAGNOSIS — Z8249 Family history of ischemic heart disease and other diseases of the circulatory system: Secondary | ICD-10-CM

## 2022-01-16 DIAGNOSIS — D7589 Other specified diseases of blood and blood-forming organs: Secondary | ICD-10-CM | POA: Diagnosis present

## 2022-01-16 DIAGNOSIS — R531 Weakness: Secondary | ICD-10-CM | POA: Diagnosis not present

## 2022-01-16 DIAGNOSIS — R778 Other specified abnormalities of plasma proteins: Secondary | ICD-10-CM | POA: Diagnosis not present

## 2022-01-16 DIAGNOSIS — I2510 Atherosclerotic heart disease of native coronary artery without angina pectoris: Secondary | ICD-10-CM | POA: Diagnosis present

## 2022-01-16 DIAGNOSIS — E8779 Other fluid overload: Secondary | ICD-10-CM | POA: Diagnosis not present

## 2022-01-16 DIAGNOSIS — Z972 Presence of dental prosthetic device (complete) (partial): Secondary | ICD-10-CM

## 2022-01-16 DIAGNOSIS — I10 Essential (primary) hypertension: Secondary | ICD-10-CM | POA: Diagnosis present

## 2022-01-16 DIAGNOSIS — Z20822 Contact with and (suspected) exposure to covid-19: Secondary | ICD-10-CM | POA: Diagnosis present

## 2022-01-16 DIAGNOSIS — E785 Hyperlipidemia, unspecified: Secondary | ICD-10-CM | POA: Diagnosis present

## 2022-01-16 DIAGNOSIS — M109 Gout, unspecified: Secondary | ICD-10-CM | POA: Diagnosis present

## 2022-01-16 DIAGNOSIS — I248 Other forms of acute ischemic heart disease: Secondary | ICD-10-CM | POA: Diagnosis present

## 2022-01-16 DIAGNOSIS — D539 Nutritional anemia, unspecified: Secondary | ICD-10-CM | POA: Diagnosis present

## 2022-01-16 DIAGNOSIS — F32A Depression, unspecified: Secondary | ICD-10-CM | POA: Diagnosis present

## 2022-01-16 DIAGNOSIS — E8770 Fluid overload, unspecified: Secondary | ICD-10-CM | POA: Diagnosis not present

## 2022-01-16 DIAGNOSIS — I5022 Chronic systolic (congestive) heart failure: Secondary | ICD-10-CM | POA: Diagnosis not present

## 2022-01-16 DIAGNOSIS — E872 Acidosis, unspecified: Secondary | ICD-10-CM | POA: Diagnosis present

## 2022-01-16 DIAGNOSIS — R0602 Shortness of breath: Secondary | ICD-10-CM | POA: Diagnosis not present

## 2022-01-16 LAB — BASIC METABOLIC PANEL
Anion gap: 14 (ref 5–15)
BUN: 31 mg/dL — ABNORMAL HIGH (ref 8–23)
CO2: 25 mmol/L (ref 22–32)
Calcium: 8.5 mg/dL — ABNORMAL LOW (ref 8.9–10.3)
Chloride: 97 mmol/L — ABNORMAL LOW (ref 98–111)
Creatinine, Ser: 4.51 mg/dL — ABNORMAL HIGH (ref 0.61–1.24)
GFR, Estimated: 12 mL/min — ABNORMAL LOW (ref >60)
Glucose, Bld: 130 mg/dL — ABNORMAL HIGH (ref 70–99)
Potassium: 3.3 mmol/L — ABNORMAL LOW (ref 3.5–5.1)
Sodium: 136 mmol/L (ref 135–145)

## 2022-01-16 LAB — CBC WITH DIFFERENTIAL/PLATELET
Abs Immature Granulocytes: 0.02 10*3/uL (ref 0.00–0.07)
Basophils Absolute: 0 10*3/uL (ref 0.0–0.1)
Basophils Relative: 0 %
Eosinophils Absolute: 0 10*3/uL (ref 0.0–0.5)
Eosinophils Relative: 1 %
HCT: 33.2 % — ABNORMAL LOW (ref 39.0–52.0)
Hemoglobin: 10.2 g/dL — ABNORMAL LOW (ref 13.0–17.0)
Immature Granulocytes: 0 %
Lymphocytes Relative: 8 %
Lymphs Abs: 0.6 10*3/uL — ABNORMAL LOW (ref 0.7–4.0)
MCH: 31.9 pg (ref 26.0–34.0)
MCHC: 30.7 g/dL (ref 30.0–36.0)
MCV: 103.8 fL — ABNORMAL HIGH (ref 80.0–100.0)
Monocytes Absolute: 0.6 10*3/uL (ref 0.1–1.0)
Monocytes Relative: 7 %
Neutro Abs: 6.9 10*3/uL (ref 1.7–7.7)
Neutrophils Relative %: 84 %
Platelets: 178 10*3/uL (ref 150–400)
RBC: 3.2 MIL/uL — ABNORMAL LOW (ref 4.22–5.81)
RDW: 15.8 % — ABNORMAL HIGH (ref 11.5–15.5)
WBC: 8.2 10*3/uL (ref 4.0–10.5)
nRBC: 0 % (ref 0.0–0.2)

## 2022-01-16 LAB — RENAL FUNCTION PANEL
Albumin: 2.5 g/dL — ABNORMAL LOW (ref 3.5–5.0)
Anion gap: 15 (ref 5–15)
BUN: 33 mg/dL — ABNORMAL HIGH (ref 8–23)
CO2: 25 mmol/L (ref 22–32)
Calcium: 8.4 mg/dL — ABNORMAL LOW (ref 8.9–10.3)
Chloride: 97 mmol/L — ABNORMAL LOW (ref 98–111)
Creatinine, Ser: 4.9 mg/dL — ABNORMAL HIGH (ref 0.61–1.24)
GFR, Estimated: 11 mL/min — ABNORMAL LOW (ref >60)
Glucose, Bld: 100 mg/dL — ABNORMAL HIGH (ref 70–99)
Phosphorus: 3.5 mg/dL (ref 2.5–4.6)
Potassium: 3.1 mmol/L — ABNORMAL LOW (ref 3.5–5.1)
Sodium: 137 mmol/L (ref 135–145)

## 2022-01-16 LAB — LACTIC ACID, PLASMA
Lactic Acid, Venous: 2.2 mmol/L (ref 0.5–1.9)
Lactic Acid, Venous: 1.3 mmol/L (ref 0.5–1.9)

## 2022-01-16 LAB — RESP PANEL BY RT-PCR (FLU A&B, COVID) ARPGX2
Influenza A by PCR: NEGATIVE
Influenza B by PCR: NEGATIVE
SARS Coronavirus 2 by RT PCR: NEGATIVE

## 2022-01-16 LAB — CBC
HCT: 31.1 % — ABNORMAL LOW (ref 39.0–52.0)
Hemoglobin: 9.5 g/dL — ABNORMAL LOW (ref 13.0–17.0)
MCH: 31.3 pg (ref 26.0–34.0)
MCHC: 30.5 g/dL (ref 30.0–36.0)
MCV: 102.3 fL — ABNORMAL HIGH (ref 80.0–100.0)
Platelets: 141 10*3/uL — ABNORMAL LOW (ref 150–400)
RBC: 3.04 MIL/uL — ABNORMAL LOW (ref 4.22–5.81)
RDW: 15.4 % (ref 11.5–15.5)
WBC: 7.2 10*3/uL (ref 4.0–10.5)
nRBC: 0 % (ref 0.0–0.2)

## 2022-01-16 LAB — TROPONIN I (HIGH SENSITIVITY)
Troponin I (High Sensitivity): 21 ng/L — ABNORMAL HIGH (ref <18)
Troponin I (High Sensitivity): 19 ng/L — ABNORMAL HIGH (ref <18)

## 2022-01-16 LAB — HEPATITIS B SURFACE ANTIBODY,QUALITATIVE: Hep B S Ab: NONREACTIVE

## 2022-01-16 LAB — BRAIN NATRIURETIC PEPTIDE: B Natriuretic Peptide: 1314.4 pg/mL — ABNORMAL HIGH (ref 0.0–100.0)

## 2022-01-16 LAB — HEPATITIS B SURFACE ANTIGEN: Hepatitis B Surface Ag: NONREACTIVE

## 2022-01-16 MED ORDER — HEPARIN SODIUM (PORCINE) 1000 UNIT/ML DIALYSIS
1000.0000 [IU] | INTRAMUSCULAR | Status: DC | PRN
  Filled 2022-01-16 (×2): qty 1

## 2022-01-16 MED ORDER — LIDOCAINE-PRILOCAINE 2.5-2.5 % EX CREA
1.0000 "application " | TOPICAL_CREAM | CUTANEOUS | Status: DC | PRN
  Filled 2022-01-16: qty 5

## 2022-01-16 MED ORDER — LIDOCAINE HCL (PF) 1 % IJ SOLN
5.0000 mL | INTRAMUSCULAR | Status: DC | PRN
Start: 2022-01-16 — End: 2022-01-19

## 2022-01-16 MED ORDER — DILTIAZEM HCL-DEXTROSE 125-5 MG/125ML-% IV SOLN (PREMIX)
5.0000 mg/h | INTRAVENOUS | Status: DC
  Administered 2022-01-16: 5 mg/h via INTRAVENOUS
  Administered 2022-01-18: 12.5 mg/h via INTRAVENOUS
  Administered 2022-01-19: 2.5 mg/h via INTRAVENOUS
  Filled 2022-01-16 (×5): qty 125

## 2022-01-16 MED ORDER — PENTAFLUOROPROP-TETRAFLUOROETH EX AERO
1.0000 "application " | INHALATION_SPRAY | CUTANEOUS | Status: DC | PRN
  Filled 2022-01-16: qty 116

## 2022-01-16 MED ORDER — DILTIAZEM HCL 25 MG/5ML IV SOLN
10.0000 mg | Freq: Once | INTRAVENOUS | Status: AC
  Administered 2022-01-16: 10 mg via INTRAVENOUS
  Filled 2022-01-16: qty 5

## 2022-01-16 MED ORDER — SODIUM CHLORIDE 0.9 % IV SOLN: 100.0000 mL | INTRAVENOUS | Status: DC | PRN

## 2022-01-16 MED ORDER — LIDOCAINE HCL (PF) 1 % IJ SOLN: 5.0000 mL | INTRAMUSCULAR | Status: DC | PRN

## 2022-01-16 MED ORDER — CHLORHEXIDINE GLUCONATE CLOTH 2 % EX PADS
6.0000 | MEDICATED_PAD | Freq: Every day | CUTANEOUS | Status: DC
  Administered 2022-01-18 – 2022-01-21 (×3): 6 via TOPICAL

## 2022-01-16 MED ORDER — DEXTROSE 5 % IV SOLN
120.0000 mg | Freq: Once | INTRAVENOUS | Status: AC
  Administered 2022-01-16: 120 mg via INTRAVENOUS
  Filled 2022-01-16: qty 10

## 2022-01-16 MED ORDER — ALTEPLASE 2 MG IJ SOLR
2.0000 mg | Freq: Once | INTRAMUSCULAR | Status: DC | PRN
  Filled 2022-01-16: qty 2

## 2022-01-16 MED ORDER — ADENOSINE 6 MG/2ML IV SOLN: 6.0000 mg | Freq: Once | INTRAVENOUS | Status: DC

## 2022-01-16 NOTE — Assessment & Plan Note (Signed)
ESRD on HD TTS Probable chronic diastolic CHF Patient recently started on dialysis about 2 weeks ago but suspecting inadequate fluid removal due to low blood pressure and elevated heart rate.  Appears volume overloaded and BNP significantly elevated.  Not hypoxic.  Chest x-ray showing cardiomegaly without pulmonary edema.  Showing increasing density in both lower lung fields, L >R suspicious for bilateral pleural effusions and underlying atelectasis/pneumonia.  Given no fever or leukocytosis, pneumonia is less likely. Echo done in June 2022 showing LVEF 55 to 60% and normal diastolic parameters. -Nephrology and cardiology consulted.  IV Lasix 120 mg x 1 ordered in the ED per nephrology recommendations. Repeat echocardiogram ordered.

## 2022-01-16 NOTE — ED Provider Notes (Signed)
[facility] EMERGENCY DEPARTMENT Provider Note   CSN: [id number] Arrival date & time: 01/16/22  1332     History  Chief Complaint  Patient presents with   Shortness of Breath    [name] [name] is a 88 y.o. male with PMHx HTN, HLD, CAD, ESRD on dialysis T Th S, CHF with EF 55-60%, A fib on Eliquis who presents to the ED today with caregiver with complaint of worsening shortness of breath. Pt recently admitted from 02/06-02/08 for same. He presented to the ED on 02/12 with similar complaint however was ultimately discharged home. Caregiver reports they received a call from pt's nephrologist who spoke with pt's cardiologist and recommend pt come in for admission. Pt last dialyzed yesterday; received full treatment however caregiver mentions his blood pressure kept dropping. Blood pressure currently 100/69. Caregiver reports pt's dry weight is ~ 160 pounds, currently patient 167.2 pounds. He denies chest pain.   The history is provided by the patient, a caregiver and medical records.      Home Medications Prior to Admission medications   Medication Sig Start Date End Date Taking? Authorizing Provider  acetaminophen (TYLENOL) 325 MG tablet Take 2 tablets (650 mg total) by mouth every 6 (six) hours as needed for mild pain (or Fever >/= 101). 01/09/22   Roxan Hockey, MD  acetaminophen (TYLENOL) 500 MG tablet Take 1,000 mg by mouth every 8 (eight) hours as needed for mild pain or moderate pain. For headache pain    [provider]  albuterol (VENTOLIN HFA) 108 (90 Base) MCG/ACT inhaler Inhale 2 puffs into the lungs every 6 (six) hours as needed for wheezing or shortness of breath (cough). 01/09/22   Roxan Hockey, MD  allopurinol (ZYLOPRIM) 100 MG tablet Take 100 mg by mouth daily.  10/07/18   [provider]  ALPRAZolam Duanne Moron) 0.5 MG tablet Take 1 tablet (0.5 mg total) by mouth 2 (two) times daily as needed for anxiety. 06/06/20   Roxan Hockey, MD   apixaban (ELIQUIS) 2.5 MG TABS tablet Take 1 tablet (2.5 mg total) by mouth 2 (two) times daily. 01/09/22   Roxan Hockey, MD  ARIPiprazole (ABILIFY) 20 MG tablet Take 1 tablet (20 mg total) by mouth daily. 01/09/22   Roxan Hockey, MD  epoetin alfa (EPOGEN) 3000 UNIT/ML injection Inject 3,000 Units into the skin every 14 (fourteen) days.     [provider]  fish oil-omega-3 fatty acids 1000 MG capsule Take 1 g by mouth daily.    [provider]  lansoprazole (PREVACID) 15 MG capsule Take 1 capsule (15 mg total) by mouth 2 (two) times daily before a meal. 06/06/20   Emokpae, Courage, MD  metoprolol tartrate (LOPRESSOR) 25 MG tablet Take 1 tablet (25 mg total) by mouth 2 (two) times daily. 01/09/22 01/09/23  Roxan Hockey, MD  midodrine (PROAMATINE) 5 MG tablet Take 1 tablet (5 mg total) by mouth every Tuesday, Thursday, Saturday, and Sunday. Take before hemodialysis 01/10/22   Roxan Hockey, MD  Multiple Vitamin (MULTIVITAMIN WITH MINERALS) TABS tablet Take 1 tablet by mouth daily.    [provider]  ofloxacin (FLOXIN) 0.3 % OTIC solution Place 5 drops into both ears daily. 10/19/18   [provider]  rosuvastatin (CRESTOR) 10 MG tablet Take 10 mg by mouth daily.    [provider]  senna-docusate (SENOKOT-S) 8.6-50 MG tablet Take 2 tablets by mouth at bedtime. 01/09/22 01/09/23  Roxan Hockey, MD  sertraline (ZOLOFT) 100 MG tablet Take 1 tablet (  100 mg total) by mouth daily. 06/06/20   Roxan Hockey, MD  sevelamer carbonate (RENVELA) 800 MG tablet Take 800 mg by mouth 3 (three) times daily. 12/28/21   [provider]  sodium bicarbonate 650 MG tablet Take 1 tablet (650 mg total) by mouth 3 (three) times daily. 01/09/22   Roxan Hockey, MD      Allergies    Ivp dye [iodinated contrast media], Nalbuphine, and Codeine    Review of Systems   Review of Systems  Constitutional:  Positive for unexpected weight change. Negative for chills and  fever.  Respiratory:  Positive for cough and shortness of breath.   Cardiovascular:  Negative for chest pain and leg swelling.  All other systems reviewed and are negative.  Physical Exam Updated Vital Signs BP 126/65    Pulse (!) 111    Temp 98.2 F (36.8 C) (Oral)    Resp 16    Ht 5\' 8"  (1.727 m)    Wt 76 kg    SpO2 93%    BMI 25.47 kg/m  Physical Exam Vitals and nursing note reviewed.  Constitutional:      Appearance: He is not ill-appearing or diaphoretic.  HENT:     Head: Normocephalic and atraumatic.  Eyes:     Conjunctiva/sclera: Conjunctivae normal.  Cardiovascular:     Rate and Rhythm: Tachycardia present.  Pulmonary:     Effort: Pulmonary effort is normal.     Breath sounds: Decreased breath sounds present. No wheezing, rhonchi or rales.  Abdominal:     Palpations: Abdomen is soft.     Tenderness: There is no abdominal tenderness. There is no guarding or rebound.  Musculoskeletal:     Cervical back: Neck supple.     Right lower leg: Edema present.     Left lower leg: Edema present.  Skin:    General: Skin is warm and dry.  Neurological:     Mental Status: He is alert.    ED Results / Procedures / Treatments   Labs (all labs ordered are listed, but only abnormal results are displayed) Labs Reviewed  CBC WITH DIFFERENTIAL/PLATELET - Abnormal; Notable for the following components:      Result Value   RBC 3.20 (*)    Hemoglobin 10.2 (*)    HCT 33.2 (*)    MCV 103.8 (*)    RDW 15.8 (*)    Lymphs Abs 0.6 (*)    All other components within normal limits  BASIC METABOLIC PANEL - Abnormal; Notable for the following components:   Potassium 3.3 (*)    Chloride 97 (*)    Glucose, Bld 130 (*)    BUN 31 (*)    Creatinine, Ser 4.51 (*)    Calcium 8.5 (*)    GFR, Estimated 12 (*)    All other components within normal limits  BRAIN NATRIURETIC PEPTIDE - Abnormal; Notable for the following components:   B Natriuretic Peptide 1,314.4 (*)    All other components  within normal limits  LACTIC ACID, PLASMA - Abnormal; Notable for the following components:   Lactic Acid, Venous 2.2 (*)    All other components within normal limits  TROPONIN I (HIGH SENSITIVITY) - Abnormal; Notable for the following components:   Troponin I (High Sensitivity) 21 (*)    All other components within normal limits  TROPONIN I (HIGH SENSITIVITY) - Abnormal; Notable for the following components:   Troponin I (High Sensitivity) 19 (*)    All other components within normal  limits  RESP PANEL BY RT-PCR (FLU A&B, COVID) [id number]  LACTIC ACID, PLASMA    EKG EKG Interpretation  Date/Time:  Wednesday January 16 2022 15:33:27 EST Ventricular Rate:  135 PR Interval:    QRS Duration: 98 QT Interval:  355 QTC Calculation: 533 R Axis:   64 Text Interpretation: Atrial flutter with 2:1 AV block Repolarization abnormality, prob rate related Prolonged QT interval No significant change since last tracing Confirmed by Aletta Edouard [phone]) on 01/16/2022 3:36:50 PM  Radiology DG Chest Portable 1 View  Result Date: 01/16/2022 CLINICAL DATA:  Shortness of breath EXAM: PORTABLE CHEST 1 VIEW COMPARISON:  Previous studies including the examination of 01/13/2022 FINDINGS: Transverse diameter of heart is increased. There are no signs of alveolar pulmonary edema. There is increased density in both lower lung fields, more so on the left side. This may suggest bilateral pleural effusions and underlying infiltrates, more so on the left side. There is increase in amount right pleural effusion. There is no pneumothorax. There is old ununited fracture in the left clavicle. IMPRESSION: Cardiomegaly. There are no signs of alveolar pulmonary edema. There is increased density in both lower lung fields, more so on the left side consistent with bilateral pleural effusions and underlying atelectasis/pneumonia. Electronically Signed   By: Elmer Picker M.D.   On: 01/16/2022 15:37    Procedures Procedures     Medications Ordered in ED Medications  furosemide (LASIX) 120 mg in dextrose 5 % 50 mL IVPB (has no administration in time range)  diltiazem (CARDIZEM) injection 10 mg (10 mg Intravenous Given 01/16/22 1846)    ED Course/ Medical Decision Making/ A&P Clinical Course as of 01/16/22 1917  Wed Jan 16, 4317  5148 88 year old male history of paroxysmal A-fib on Eliquis here with shortness of breath and fluid overload.  Newly on dialysis.  Initial EKG was probable a flutter in the 150s currently normal sinus and 90.  Getting labs chest x-ray and likely needs cardiology consult. [MB]    Clinical Course User Index [MB] Hayden Rasmussen, MD                           Medical Decision Making 88 year old male presenting to the ED today with complaint of ongoing SOB and weight gain. Dialyzed T TH S and last dialyzed yesterday. Hx of CHF as well with recent admission for same. On arrival to the ED pt is noted to be tachycardic with HR in the 150's. Initial EKG with concern for possible SVT however during exam pt's heart rate fluctuating from the teens into the 150's. Repeat EKG shows a flutter with heart rate in the 130's. When attending physician Dr. Melina Copa evaluated patient he was in NSR with heart rate of 90. Currently on Midodrine on dialysis days. Not currently on Lasix; appears to have been taken off during last hospitalization. Will plan for labs including CBC, CMP, troponin, BNP and CXR. Will plan to consult cardiology.   Problems Addressed: Acute on chronic congestive heart failure, unspecified heart failure type [facility]): acute illness or injury Atrial flutter, unspecified type [facility]): acute illness or injury  Amount and/or Complexity of Data Reviewed External Data Reviewed: notes.    Details: Recent admission with new onset A fib; started on Eliquis. Does appear pt was also taken off of Lasix at that time. Recommendations to address volume status with dialysis. Labs: ordered.    Details: CBC  without leukocytosis. Hgb stable at 10.2  with imrovement from previous. Lactic acid elevated at 2.2. Do not want to fluid overload at this time given CHF and ESRD. Will hold fluids at this time.  BMP with stable creatnine 4.51 and BUN 31. potassium 3.3.  Troponin 21.  BNP elevated at 1,314.4. Slightly increased from 3 days ago. Radiology: ordered.    Details: CXR with finidngs of cardoimegaly and increased density in both lower lung fields; worse on left side consitent with bilateral pleural effusions and underlying atelectasis/pneumonia. Discussion of management or test interpretation with external provider(s): Discussed case with cardiologist Dr. Claiborne Billings who recommends 10 mg IV cardizem given A fib/A flutter. Will come consult. Given ESRD on dialysis with little urine output will hold off on lasix at this time.  Nursing staff informed that Dr. Claiborne Billings came to evaluate patient. Pt to be placed on 25 mg Metoprolol BID. Recommends to let him know if pt has no improvement after IV cardizem to potentially start on infusion.   Discussed case with nephrologist Dr. Azzie Roup who recommends 120 IV Lasix.    [facility] Dr. Marlowe Sax to admit.   Risk Prescription drug management. Decision regarding hospitalization.          Final Clinical Impression(s) / ED Diagnoses Final diagnoses:  Acute on chronic congestive heart failure, unspecified heart failure type [facility])  Atrial flutter, unspecified type ([place])  ESRD on dialysis [facility])    Rx / DC Orders ED Discharge Orders     None         Eustaquio Maize, PA-C 01/16/22 1917    Hayden Rasmussen, MD 01/17/22 [phone]

## 2022-01-16 NOTE — Assessment & Plan Note (Addendum)
Blood pressure currently stable. -On IV Cardizem, hold oral metoprolol at this time.

## 2022-01-16 NOTE — Assessment & Plan Note (Signed)
He was started on metoprolol 25 mg twice daily and Eliquis during recent hospitalization.  Rate initially in the 150s and was given IV Cardizem 10 mg.  At present, continues to be in A-fib with rate up to 130s.  Systolic in the 706C. -Hold home metoprolol at this time and start Cardizem drip at 5 mg/h, discussed with on-call cardiologist.  Continue Eliquis.

## 2022-01-16 NOTE — Assessment & Plan Note (Addendum)
Hemoglobin stable 

## 2022-01-16 NOTE — ED Provider Triage Note (Signed)
Emergency Medicine Provider Triage Evaluation Note  [name] [name] , a 88 y.o. male  was evaluated in triage.  Pt complains of SOB, CP. HR 150's here in triage. Seen recently. Sent by outpatient MD who req admission per nursing staff. Compliant with dialysis. OnEliquis  Review of Systems  Positive: CP, SOB Negative: fever  Physical Exam  BP 100/69 (BP Location: Right Arm)    Pulse (!) 154    Temp 98.2 F (36.8 C) (Oral)    Resp 20    SpO2 97%  Gen:   Awake, no distress  Cardiac: Tachycardic, irregular rhythm   Resp:  Normal effort  MSK:   Moves extremities without difficulty  Other:    Medical Decision Making  Medically screening exam initiated at 2:43 PM.  Appropriate orders placed.  [name] [name] was informed that the remainder of the evaluation will be completed by another provider, this initial triage assessment does not replace that evaluation, and the importance of remaining in the ED until their evaluation is complete.  Heart rate greater than 150  Nursing aware patient needs room in back.   Amina Menchaca A, PA-C 01/16/22 1444

## 2022-01-16 NOTE — Assessment & Plan Note (Signed)
Likely due to demand ischemia. High-sensitivity troponin borderline elevated but stable (21 > 19).  Patient is not endorsing chest pain. -Cardiac monitoring

## 2022-01-16 NOTE — ED Triage Notes (Signed)
Pt arrived POV with care giver from doctors office for [facility] and fluid overload. Pt's HR is in the 150's.

## 2022-01-16 NOTE — Progress Notes (Signed)
Patient ID: [name] [name], male   DOB: [birth year], 88 y.o.   MRN: [id number] HPI:  [name] [name] is a 88 y.o. male with past medical history significant for HTN, CHF, hyperlipidemia, and ESRD (was recently started dialysis as an OP-  TTS- [facility]).  He came into the [facility] ED today with complaints of SOB.  He was noted to have A fib with RVR.  He had a similar presentation on 01/07/22 and again on 01/13/22.  He was seen by our service during his admission at [facility] from 2/6-01/09/22.  In the ED his HR was in the 130's-150's and BNP was 1314.  Cardiology was consulted and recommended admission and further evaluation and management of his acute on chronic CHF and A fib with RVR.  We were asked to provide HD during his admission.  Please see consult note from 01/07/22 for full details.   O:BP 126/65    Pulse (!) 111    Temp 98.2 F (36.8 C) (Oral)    Resp 16    Ht 5\' 8"  (1.727 m)    Wt 76 kg    SpO2 93%    BMI 25.47 kg/m  No intake or output data in the 24 hours ending 01/16/22 2007 Intake/Output: No intake/output data recorded.  Intake/Output this shift:  No intake/output data recorded. Weight change:  [name], [name] in mild distress [name] at 135 [name] [name], soft, [name] Ext: no edema, L forearm AVG +T/B  Recent Labs  Lab 01/13/22 1402 01/16/22 1455  NA 136 136  K 3.4* 3.3*  CL 98 97*  CO2 30 25  GLUCOSE 135* 130*  BUN 35* 31*  CREATININE 4.60* 4.51*  ALBUMIN 2.9*  --   CALCIUM 8.3* 8.5*  AST 33  --   ALT 27  --    Liver Function Tests: Recent Labs  Lab 01/13/22 1402  AST 33  ALT 27  ALKPHOS 88  BILITOT 0.4  PROT 7.5  ALBUMIN 2.9*   No results for input(s): LIPASE, AMYLASE in the last 168 hours. No results for input(s): AMMONIA in the last 168 hours. CBC: Recent Labs  Lab 01/13/22 1402 01/16/22 1455  WBC 9.5 8.2  NEUTROABS 8.2* 6.9  HGB 10.1* 10.2*  HCT 32.4* 33.2*  MCV 102.2* 103.8*  PLT 213 178   Cardiac  Enzymes: No results for input(s): CKTOTAL, CKMB, CKMBINDEX, TROPONINI in the last 168 hours. CBG: No results for input(s): GLUCAP in the last 168 hours.  Iron Studies: No results for input(s): IRON, TIBC, TRANSFERRIN, FERRITIN in the last 72 hours. Studies/Results: DG Chest Portable 1 View  Result Date: 01/16/2022 CLINICAL DATA:  Shortness of breath EXAM: PORTABLE CHEST 1 VIEW COMPARISON:  Previous studies including the examination of 01/13/2022 FINDINGS: Transverse diameter of heart is increased. There are no signs of alveolar pulmonary edema. There is increased density in both lower lung fields, more so on the left side. This may suggest bilateral pleural effusions and underlying infiltrates, more so on the left side. There is increase in amount right pleural effusion. There is no pneumothorax. There is old ununited fracture in the left clavicle. IMPRESSION: Cardiomegaly. There are no signs of alveolar pulmonary edema. There is increased density in both lower lung fields, more so on the left side consistent with bilateral pleural effusions and underlying atelectasis/pneumonia. Electronically Signed   By: Elmer Picker M.D.   On: 01/16/2022 15:37     BMET    Component Value Date/Time  NA 136 01/16/2022 1455   K 3.3 (L) 01/16/2022 1455   CL 97 (L) 01/16/2022 1455   CO2 25 01/16/2022 1455   GLUCOSE 130 (H) 01/16/2022 1455   BUN 31 (H) 01/16/2022 1455   CREATININE 4.51 (H) 01/16/2022 1455   CALCIUM 8.5 (L) 01/16/2022 1455   CALCIUM 8.9 11/29/2021 1503   GFRNONAA 12 (L) 01/16/2022 1455   GFRAA 27 (L) 06/06/2020 0559   CBC    Component Value Date/Time   WBC 8.2 01/16/2022 1455   RBC 3.20 (L) 01/16/2022 1455   HGB 10.2 (L) 01/16/2022 1455   HCT 33.2 (L) 01/16/2022 1455   PLT 178 01/16/2022 1455   MCV 103.8 (H) 01/16/2022 1455   MCH 31.9 01/16/2022 1455   MCHC 30.7 01/16/2022 1455   RDW 15.8 (H) 01/16/2022 1455   LYMPHSABS 0.6 (L) 01/16/2022 1455   MONOABS 0.6 01/16/2022 1455    EOSABS 0.0 01/16/2022 1455   BASOSABS 0.0 01/16/2022 1455   HD [facility] TTS.  No information available in care everywhere and will need to contact HD unit tomorrow when it is open for HD prescription and meds.  Assessment/Plan:  Atrial fibrillation with RVR - Cardiology following and pt started on Eliquis at his last hospitalization as well as metoprolol.  No given IV dilt per Cardiology. Acute on chronic CHF - Cardiology planning for repeat ECHO. ESRD - recent start 2 weeks ago and not doing well with HD.  Unable to UF due to low BP and A fib/RVR.  Will attempt HD with UF Anemia of ESRD - will follow H/H and use ESA asa needed. HLD - on rosuvastatin HTN - low bp with RVR.  Plan per cardiology CKD-BMD - follow ca/phos, not on meds Disposition - pt with new ESRD and recurrent SOB/CHF despite HD and unable to UF due to low BP.  He has been hospitalized 4 times since starting HD and was seen by Palliative care at his last admission.  He is now having FTT and would recommend re-consulting Palliative care for ongoing goals of care discussions.  Donetta Potts, MD [employer] [phone]

## 2022-01-16 NOTE — Assessment & Plan Note (Signed)
Lactic acidosis mild and resolved. Infection less likely given no fever or leukocytosis. -Check procalcitonin level

## 2022-01-16 NOTE — ED Notes (Signed)
CRITICAL VALUE STICKER  CRITICAL VALUE:Lactic acid  DATE & TIME NOTIFIED: 01/16/22 1528  MESSENGER (representative from lab):  PA NOTIFIED: Margaux  TIME OF NOTIFICATION: 1528  RESPONSE:  see chart

## 2022-01-16 NOTE — H&P (Addendum)
History and Physical    [name] [name] LZJ:[id number] DOB: 09-May-1934 DOA: 01/16/2022  PCP: [name], MD  Patient coming from: Home  HPI: [name] [name] is a 88 y.o. male with medical history significant of with medical history significant of ESRD with recent initiation of dialysis on TTS schedule, anemia of chronic disease, iron deficiency anemia, CHF, hypertension, hyperlipidemia, GERD, anxiety, depression, history of prostate cancer in remission. Patient was recently admitted to [facility] 2/6-2/8 for increasing shortness of breath which progressively worsened after his hemodialysis.  He was found to be in atrial fibrillation with rate 69-104, BNP elevated and chest x-ray showing small bilateral pleural effusions.  He was started on Eliquis, metoprolol tartrate 25 mg twice daily, and Crestor.  Carvedilol, amlodipine, hydralazine, and Lasix were stopped due to low blood pressure.  Started on midodrine.  He presented to the ED on 2/12 with similar complaint and was ultimately discharged home.  Patient returns to the ED today for evaluation of shortness of breath and fluid overload.  Caregiver reported that they received a call from the patient's nephrologist who spoke to the patient's cardiologist and recommended that the patient come in for admission.  He was last dialyzed yesterday, received full treatment however caregiver mentioned that his blood pressure kept dropping.  Reportedly dry weight is approximately 160 pounds.  Upon arrival to the ED today, found to be in A-fib with rate in the 150s.  Not febrile.  Not hypoxic.  Labs showing no leukocytosis.  Hemoglobin 10.2, stable.  Potassium 3.3.  Bicarb 25.  BNP 1314.  High-sensitivity troponin borderline elevated but stable (21 > 19).  Lactic acid 2.2 > 1.3.  COVID and flu negative.  Chest x-ray showing cardiomegaly without pulmonary edema.  Showing increasing density in both lower lung fields, L >R suspicious for bilateral pleural  effusions and underlying atelectasis/pneumonia. Patient was given IV Cardizem 10 mg but rate continued to be uncontrolled.  Cardiology consulted.  Nephrology consulted and recommended giving IV Lasix 120 mg.     Patient reports 3-week history of progressively worsening shortness of breath.  States sometimes he feels his heart beating.  Reports cough.  Denies fevers, chills, or chest pain.  States his last dialysis session was yesterday and he thinks not enough fluid is being removed.  No other complaints.  Review of Systems:  All systems reviewed and apart from history of presenting illness, are negative.  Past Medical History:  Diagnosis Date   Anemia    Anxiety    Artificial opening care, other    pt states that he has artifical sphincter, unable to have foley cath placed.    Cancer ([place])    prostate ca 1999/removal/ rad tx   Carotid artery occlusion    Chronic diastolic heart failure (HCC)    CKD (chronic kidney disease)    Depression    DVT (deep venous thrombosis) (HCC)    Gout    History of kidney stones    Hyperlipidemia    Hypertension    Hyponatremia    Incontinence    Reflux    Renal disorder    Status post implantation of artificial urinary sphincter    Wears dentures    Wears glasses     Past Surgical History:  Procedure Laterality Date   AV FISTULA PLACEMENT Left 05/15/2020   Procedure: LEFT FOREARM  ARTERIOVENOUS (AV) GRAFT INSERTION;  Surgeon: Angelia Mould, MD;  Location: [facility];  Service: Vascular;  Laterality: Left;  CARDIAC CATHETERIZATION     2000-2001   cataracts     CORONARY STENT PLACEMENT     HERNIA REPAIR     MULTIPLE TOOTH EXTRACTIONS     NEPHROSTOMY     radical prostectomy     URINARY SPHINCTER IMPLANT       reports that he quit smoking about 49 years ago. His smoking use included cigarettes. He started smoking about 59 years ago. He smoked an average of 2 packs per day. He has never used smokeless tobacco. He reports that he does not  drink alcohol and does not use drugs.  Allergies  Allergen Reactions   Ivp Dye [Iodinated Contrast Media]     Due to partial renal failure   Nalbuphine Other (See Comments)    PATIENT STATES IT MADE HIM FEEL LIKE HE WAS GOING TO DIE. NO SPECIFICS   Codeine Anxiety    Family History  Problem Relation Age of Onset   Coronary artery disease Other        family history, male < 19   Arthritis Other        family history   Cancer Other    Kidney disease Other     Prior to Admission medications   Medication Sig Start Date End Date Taking? Authorizing Provider  acetaminophen (TYLENOL) 325 MG tablet Take 2 tablets (650 mg total) by mouth every 6 (six) hours as needed for mild pain (or Fever >/= 101). 01/09/22   Roxan Hockey, MD  acetaminophen (TYLENOL) 500 MG tablet Take 1,000 mg by mouth every 8 (eight) hours as needed for mild pain or moderate pain. For headache pain    [provider]  albuterol (VENTOLIN HFA) 108 (90 Base) MCG/ACT inhaler Inhale 2 puffs into the lungs every 6 (six) hours as needed for wheezing or shortness of breath (cough). 01/09/22   Roxan Hockey, MD  allopurinol (ZYLOPRIM) 100 MG tablet Take 100 mg by mouth daily.  10/07/18   [provider]  ALPRAZolam Duanne Moron) 0.5 MG tablet Take 1 tablet (0.5 mg total) by mouth 2 (two) times daily as needed for anxiety. 06/06/20   Roxan Hockey, MD  apixaban (ELIQUIS) 2.5 MG TABS tablet Take 1 tablet (2.5 mg total) by mouth 2 (two) times daily. 01/09/22   Roxan Hockey, MD  ARIPiprazole (ABILIFY) 20 MG tablet Take 1 tablet (20 mg total) by mouth daily. 01/09/22   Roxan Hockey, MD  epoetin alfa (EPOGEN) 3000 UNIT/ML injection Inject 3,000 Units into the skin every 14 (fourteen) days.     [provider]  fish oil-omega-3 fatty acids 1000 MG capsule Take 1 g by mouth daily.    [provider]  lansoprazole (PREVACID) 15 MG capsule Take 1 capsule (15 mg total) by mouth 2 (two) times daily before  a meal. 06/06/20   Emokpae, Courage, MD  metoprolol tartrate (LOPRESSOR) 25 MG tablet Take 1 tablet (25 mg total) by mouth 2 (two) times daily. 01/09/22 01/09/23  Roxan Hockey, MD  midodrine (PROAMATINE) 5 MG tablet Take 1 tablet (5 mg total) by mouth every Tuesday, Thursday, Saturday, and Sunday. Take before hemodialysis 01/10/22   Roxan Hockey, MD  Multiple Vitamin (MULTIVITAMIN WITH MINERALS) TABS tablet Take 1 tablet by mouth daily.    [provider]  ofloxacin (FLOXIN) 0.3 % OTIC solution Place 5 drops into both ears daily. 10/19/18   [provider]  rosuvastatin (CRESTOR) 10 MG tablet Take 10 mg by mouth daily.    [provider]  senna-docusate (SENOKOT-S) 8.6-50 MG tablet Take 2 tablets by mouth at bedtime. 01/09/22 01/09/23  Roxan Hockey, MD  sertraline (ZOLOFT) 100 MG tablet Take 1 tablet (100 mg total) by mouth daily. 06/06/20   Roxan Hockey, MD  sevelamer carbonate (RENVELA) 800 MG tablet Take 800 mg by mouth 3 (three) times daily. 12/28/21   [provider]  sodium bicarbonate 650 MG tablet Take 1 tablet (650 mg total) by mouth 3 (three) times daily. 01/09/22   Roxan Hockey, MD    Physical Exam: Vitals:   01/16/22 1700 01/16/22 1715 01/16/22 1745 01/16/22 1830  BP:  121/82 128/81 126/65  Pulse: 94 (!) 138 (!) 142 (!) 111  Resp: 19 (!) 21 17 16   Temp:      TempSrc:      SpO2: 95% 94% 95% 93%  Weight:      Height:        Physical Exam Constitutional:      General: He is not in acute distress. HENT:     Head: Normocephalic and atraumatic.  Eyes:     Extraocular Movements: Extraocular movements intact.     Conjunctiva/sclera: Conjunctivae normal.  Cardiovascular:     Rate and Rhythm: Tachycardia present. Rhythm irregular.     Pulses: Normal pulses.  Pulmonary:     Effort: Pulmonary effort is normal. No respiratory distress.     Breath sounds: No wheezing or rales.  Abdominal:     General: Bowel sounds are normal. There is no  distension.     Palpations: Abdomen is soft.     Tenderness: There is no abdominal tenderness.  Musculoskeletal:     Cervical back: Normal range of motion and neck supple.     Right lower leg: Edema present.     Left lower leg: Edema present.  Skin:    General: Skin is warm and dry.  Neurological:     General: No focal deficit present.     Mental Status: He is alert and oriented to person, place, and time.     Labs on Admission: I have personally reviewed following labs and imaging studies  CBC: Recent Labs  Lab 01/13/22 1402 01/16/22 1455  WBC 9.5 8.2  NEUTROABS 8.2* 6.9  HGB 10.1* 10.2*  HCT 32.4* 33.2*  MCV 102.2* 103.8*  PLT 213 425   Basic Metabolic Panel: Recent Labs  Lab 01/13/22 1402 01/16/22 1455  NA 136 136  K 3.4* 3.3*  CL 98 97*  CO2 30 25  GLUCOSE 135* 130*  BUN 35* 31*  CREATININE 4.60* 4.51*  CALCIUM 8.3* 8.5*   GFR: Estimated Creatinine Clearance: 11.6 mL/min (A) (by C-G formula based on SCr of 4.51 mg/dL (H)). Liver Function Tests: Recent Labs  Lab 01/13/22 1402  AST 33  ALT 27  ALKPHOS 88  BILITOT 0.4  PROT 7.5  ALBUMIN 2.9*   No results for input(s): LIPASE, AMYLASE in the last 168 hours. No results for input(s): AMMONIA in the last 168 hours. Coagulation Profile: No results for input(s): INR, PROTIME in the last 168 hours. Cardiac Enzymes: No results for input(s): CKTOTAL, CKMB, CKMBINDEX, TROPONINI in the last 168 hours. BNP (last 3 results) No results for input(s): PROBNP in the last 8760 hours. HbA1C: No results for input(s): HGBA1C in the last 72 hours. CBG: No results for input(s): GLUCAP in the last 168 hours. Lipid Profile: No results for input(s): CHOL, HDL, LDLCALC, TRIG, CHOLHDL, LDLDIRECT in the last 72 hours. Thyroid Function Tests: No results for input(s): TSH,  T4TOTAL, FREET4, T3FREE, THYROIDAB in the last 72 hours. Anemia Panel: No results for input(s): VITAMINB12, FOLATE, FERRITIN, TIBC, IRON, RETICCTPCT in  the last 72 hours. Urine analysis:    Component Value Date/Time   COLORURINE YELLOW 08/23/2021 1143   APPEARANCEUR CLEAR 08/23/2021 1143   LABSPEC 1.020 08/23/2021 1143   PHURINE 6.0 08/23/2021 1143   GLUCOSEU NEGATIVE 08/23/2021 1143   HGBUR MODERATE (A) 08/23/2021 1143   BILIRUBINUR NEGATIVE 08/23/2021 1143   KETONESUR NEGATIVE 08/23/2021 1143   PROTEINUR 100 (A) 08/23/2021 1143   UROBILINOGEN 0.2 03/14/2010 0809   NITRITE NEGATIVE 08/23/2021 1143   LEUKOCYTESUR NEGATIVE 08/23/2021 1143    Radiological Exams on Admission: I have personally reviewed images DG Chest Portable 1 View  Result Date: 01/16/2022 CLINICAL DATA:  Shortness of breath EXAM: PORTABLE CHEST 1 VIEW COMPARISON:  Previous studies including the examination of 01/13/2022 FINDINGS: Transverse diameter of heart is increased. There are no signs of alveolar pulmonary edema. There is increased density in both lower lung fields, more so on the left side. This may suggest bilateral pleural effusions and underlying infiltrates, more so on the left side. There is increase in amount right pleural effusion. There is no pneumothorax. There is old ununited fracture in the left clavicle. IMPRESSION: Cardiomegaly. There are no signs of alveolar pulmonary edema. There is increased density in both lower lung fields, more so on the left side consistent with bilateral pleural effusions and underlying atelectasis/pneumonia. Electronically Signed   By: Elmer Picker M.D.   On: 01/16/2022 15:37    EKG: I have personally reviewed EKG: Atrial fibrillation versus flutter.  QT prolongation.  Assessment and Plan: * Atrial fibrillation with rapid ventricular response ([place])- (present on admission) He was started on metoprolol 25 mg twice daily and Eliquis during recent hospitalization.  Rate initially in the 150s and was given IV Cardizem 10 mg.  At present, continues to be in A-fib with rate up to 130s.  Systolic in the 622W. -Hold home  metoprolol at this time and start Cardizem drip at 5 mg/h, discussed with on-call cardiologist.  Continue Eliquis.   Volume overload- (present on admission) ESRD on HD TTS Probable chronic diastolic CHF Patient recently started on dialysis about 2 weeks ago but suspecting inadequate fluid removal due to low blood pressure and elevated heart rate.  Appears volume overloaded and BNP significantly elevated.  Not hypoxic.  Chest x-ray showing cardiomegaly without pulmonary edema.  Showing increasing density in both lower lung fields, L >R suspicious for bilateral pleural effusions and underlying atelectasis/pneumonia.  Given no fever or leukocytosis, pneumonia is less likely. Echo done in June 2022 showing LVEF 55 to 60% and normal diastolic parameters. -Nephrology and cardiology consulted.  IV Lasix 120 mg x 1 ordered in the ED per nephrology recommendations. Repeat echocardiogram ordered.  Lactic acidosis- (present on admission) Lactic acidosis mild and resolved. Infection less likely given no fever or leukocytosis. -Check procalcitonin level  Elevated troponin- (present on admission) Likely due to demand ischemia. High-sensitivity troponin borderline elevated but stable (21 > 19).  Patient is not endorsing chest pain. -Cardiac monitoring  Chronic anemia- (present on admission) Hemoglobin stable.  Essential hypertension- (present on admission) Blood pressure currently stable. -On IV Cardizem, hold oral metoprolol at this time.   DVT prophylaxis: Resume Eliquis after pharmacy med rec is done. Code Status: DNR (discussed with the patient) Family Communication: No family available at this time. Consults called: Cardiology Level of care: Progressive Care Unit Admission status: It is my  clinical opinion that admission to INPATIENT is reasonable and necessary because of the expectation that this patient will require hospital care that crosses at least 2 midnights to treat this condition based  on the medical complexity of the problems presented.  Given the aforementioned information, the predictability of an adverse outcome is felt to be significant.   Shela Leff, MD [facility] 01/16/2022, 8:59 PM

## 2022-01-16 NOTE — Consult Note (Addendum)
Cardiology Consultation:   Patient ID: [name] [name] MRN: [id number]; DOB: 1934/03/10  Admit date: 01/16/2022 Date of Consult: 01/16/2022  PCP:  [name] Squibb, MD   [facility] Providers Cardiologist:   Dr. Charm Barges     Patient Profile:   [name] [name] is a 88 y.o. male with a hx of hypertension, hyperlipidemia, CKD, prostate CA, who is being seen 01/16/2022 for the evaluation of atrial fibrillation at the request of  session.  History of Present Illness:   [name] [name] has a history of hypertension, hyperlipidemia, stage V CKD status post left arm fistula and with recent initiation of dialysis 1 week ago.  He has a history of anemia of chronic disease, anxiety and depression as well as diastolic heart failure.  Most recently, he has been evaluated by Dr. Candis [name] at [facility] audiology at [facility] for heart failure.  He was last seen by him in September 2022 and was still making small amount of urine.  Prior to that evaluation he had been hospitalized at [facility] with heart failure.  Patient was recently admitted to [facility] on February 6 and discharged on January 09, 2022 increasing shortness of breath which progressively worsened after his hemodialysis.  During that evaluation he was found to be in atrial fibrillation with heart rate ranging from 69 to 104 bpm.  X-ray showed an small bilateral pleural effusions.  He was started on Eliquis, metoprolol tartrate 25 mg twice a day was on rosuvastatin 10 mg sertraline.  He was told to stop carvedilol, amlodipine, furosemide and Omnicef.  On January 13, 2022 with complaints of shortness of breath and 4 pound weight gain.  During that evaluation heart rate was 121 and blood pressure was 85/57 but repeat blood pressure improved to 120 and his heart rate slowed to the 90s on cardiac monitor.  He undergoes dialysis on Tuesday, Thursdays and Saturdays.     Currently, his dialysis on Saturday and Tuesday was shortened due to  increased heart rate and low blood pressure and as result he was told that he was unable to have taken off the amount of fluid that was necessary.  He was called by the person he had seen yesterday in dialysis and was told to come to [facility] today for further evaluation and treatment.  Urgency room, the patient has been in rapid atrial fibrillation with rates as high as 150.  He apparently was given medication which slowed his heart rate but again heart rate again is increased to 1 30-1 40 range.  BNP is increased at 1314 consistent with CHF.  Cardiology was contacted to assist in rate control and management.  The patient will be admitted by the hospitalist service and will also be evaluated by nephrology.    Past Medical History:  Diagnosis Date   Anemia    Anxiety    Artificial opening care, other    pt states that he has artifical sphincter, unable to have foley cath placed.    Cancer ([place])    prostate ca 1999/removal/ rad tx   Carotid artery occlusion    Chronic diastolic heart failure (HCC)    CKD (chronic kidney disease)    Depression    DVT (deep venous thrombosis) (HCC)    Gout    History of kidney stones    Hyperlipidemia    Hypertension    Hyponatremia    Incontinence    Reflux    Renal disorder    Status post  implantation of artificial urinary sphincter    Wears dentures    Wears glasses     Past Surgical History:  Procedure Laterality Date   AV FISTULA PLACEMENT Left 05/15/2020   Procedure: LEFT FOREARM  ARTERIOVENOUS (AV) GRAFT INSERTION;  Surgeon: Angelia Mould, MD;  Location: [place];  Service: Vascular;  Laterality: Left;   CARDIAC CATHETERIZATION     2000-2001   cataracts     CORONARY STENT PLACEMENT     HERNIA REPAIR     MULTIPLE TOOTH EXTRACTIONS     NEPHROSTOMY     radical prostectomy     URINARY SPHINCTER IMPLANT       Current Outpatient Medications  Medication Instructions   acetaminophen (TYLENOL) 1,000 mg, Oral, Every 8 hours PRN,  For headache pain    acetaminophen (TYLENOL) 650 mg, Oral, Every 6 hours PRN   albuterol (VENTOLIN HFA) 108 (90 Base) MCG/ACT inhaler 2 puffs, Inhalation, Every 6 hours PRN   allopurinol (ZYLOPRIM) 100 mg, Oral, Daily   ALPRAZolam (XANAX) 0.5 mg, Oral, 2 times daily PRN   apixaban (ELIQUIS) 2.5 mg, Oral, 2 times daily   ARIPiprazole (ABILIFY) 20 mg, Oral, Daily   epoetin alfa (EPOGEN) 3,000 Units, Subcutaneous, Every 14 days   fish oil-omega-3 fatty acids 1 g, Oral, Daily   lansoprazole (PREVACID) 15 mg, Oral, 2 times daily before meals   metoprolol tartrate (LOPRESSOR) 25 mg, Oral, 2 times daily   midodrine (PROAMATINE) 5 mg, Oral, Every T-Th-S-Su, Take before hemodialysis   Multiple Vitamin (MULTIVITAMIN WITH MINERALS) TABS tablet 1 tablet, Oral, Daily   ofloxacin (FLOXIN) 0.3 % OTIC solution 5 drops, Both EARS, Daily   rosuvastatin (CRESTOR) 10 mg, Oral, Daily   senna-docusate (SENOKOT-S) 8.6-50 MG tablet 2 tablets, Oral, Daily at bedtime   sertraline (ZOLOFT) 100 mg, Oral, Daily   sevelamer carbonate (RENVELA) 800 mg, Oral, 3 times daily   sodium bicarbonate 650 mg, Oral, 3 times daily     Inpatient Medications: Scheduled Meds:  diltiazem  10 mg Intravenous Once   Continuous Infusions:  PRN Meds:   Allergies:    Allergies  Allergen Reactions   Ivp Dye [Iodinated Contrast Media]     Due to partial renal failure   Nalbuphine Other (See Comments)    PATIENT STATES IT MADE HIM FEEL LIKE HE WAS GOING TO DIE. NO SPECIFICS   Codeine Anxiety    Social History:   Social History   Socioeconomic History   Marital status: Widowed    Spouse name: Not on file   Number of children: 2   Years of education: Not on file   Highest education level: Not on file  Occupational History   Occupation: retired  Tobacco Use   Smoking status: Former    Packs/day: 2.00    Types: Cigarettes    Start date: 08/05/1962    Quit date: 12/02/1972    Years since quitting: 49.1   Smokeless  tobacco: Never  Vaping Use   Vaping Use: Never used  Substance and Sexual Activity   Alcohol use: No   Drug use: No   Sexual activity: Not on file  Other Topics Concern   Not on file  Social History Narrative   Not on file   Social Determinants of Health   Financial Resource Strain: Not on file  Food Insecurity: Not on file  Transportation Needs: Not on file  Physical Activity: Not on file  Stress: Not on file  Social Connections: Not on file  Intimate Partner Violence: Not on file    Family History:    Family History  Problem Relation Age of Onset   Coronary artery disease Other        family history, male < 89   Arthritis Other        family history   Cancer Other    Kidney disease Other      ROS:  Please see the history of present illness.  No fever chills night sweats No wheezing Palpitation Dyspnea No abdominal pain Mild edema prostate ca  All other ROS reviewed and negative.     Physical Exam/Data:   Vitals:   01/16/22 1444 01/16/22 1700 01/16/22 1715 01/16/22 1745  BP:   121/82 128/81  Pulse:  94 (!) 138 (!) 142  Resp:  19 (!) 21 17  Temp:      TempSrc:      SpO2:  95% 94% 95%  Weight: 76 kg     Height: 5\' 8"  (1.727 m)      No intake or output data in the 24 hours ending 01/16/22 1818 Last 3 Weights 01/16/2022 01/13/2022 01/09/2022  Weight (lbs) 167 lb 8 oz 174 lb 179 lb 0.2 oz  Weight (kg) 75.978 kg 78.926 kg 81.2 kg     Body mass index is 25.47 kg/m.  General:  Well nourished, well developed, in no acute distress HEENT: normal Neck: no JVD Vascular: No carotid bruits; Distal pulses 2+ bilaterally Cardiac:  normal S1, S2; irregularly irregular ventricular rate around 120 with 1/6 to 2/6 systolic murmur lower sternal border and apex Lungs:  clear to auscultation bilaterally, no wheezing, rhonchi or rales  Abd: soft, nontender, no hepatomegaly  Ext: Race edema Musculoskeletal: Left arm AV fistula BUE and BLE strength normal and equal Skin:  warm and dry  Neuro:  CNs 2-12 intact, no focal abnormalities noted Psych:  Normal affect   EKG:  The EKG was personally reviewed and demonstrates: Atrial fibrillation at 135 bpm Telemetry:  Telemetry was personally reviewed and demonstrates: Atrial fibrillation with current rate approximately 120 bpm  Relevant CV Studies:   Laboratory Data:  High Sensitivity Troponin:   Recent Labs  Lab 01/07/22 1118 01/07/22 1305 01/16/22 1455  TROPONINIHS 8 9 21*     Chemistry Recent Labs  Lab 01/13/22 1402 01/16/22 1455  NA 136 136  K 3.4* 3.3*  CL 98 97*  CO2 30 25  GLUCOSE 135* 130*  BUN 35* 31*  CREATININE 4.60* 4.51*  CALCIUM 8.3* 8.5*  GFRNONAA 12* 12*  ANIONGAP 8 14    Recent Labs  Lab 01/13/22 1402  PROT 7.5  ALBUMIN 2.9*  AST 33  ALT 27  ALKPHOS 88  BILITOT 0.4   Lipids No results for input(s): CHOL, TRIG, HDL, LABVLDL, LDLCALC, CHOLHDL in the last 168 hours.  Hematology Recent Labs  Lab 01/13/22 1402 01/16/22 1455  WBC 9.5 8.2  RBC 3.17* 3.20*  HGB 10.1* 10.2*  HCT 32.4* 33.2*  MCV 102.2* 103.8*  MCH 31.9 31.9  MCHC 31.2 30.7  RDW 15.9* 15.8*  PLT 213 178   Thyroid No results for input(s): TSH, FREET4 in the last 168 hours.  BNP Recent Labs  Lab 01/13/22 1402 01/16/22 1455  BNP 1,282.0* 1,314.4*    DDimer No results for input(s): DDIMER in the last 168 hours.   Radiology/Studies:  DG Chest Portable 1 View  Result Date: 01/16/2022 CLINICAL DATA:  Shortness of breath EXAM: PORTABLE CHEST 1 VIEW COMPARISON:  Previous studies  including the examination of 01/13/2022 FINDINGS: Transverse diameter of heart is increased. There are no signs of alveolar pulmonary edema. There is increased density in both lower lung fields, more so on the left side. This may suggest bilateral pleural effusions and underlying infiltrates, more so on the left side. There is increase in amount right pleural effusion. There is no pneumothorax. There is old ununited fracture in  the left clavicle. IMPRESSION: Cardiomegaly. There are no signs of alveolar pulmonary edema. There is increased density in both lower lung fields, more so on the left side consistent with bilateral pleural effusions and underlying atelectasis/pneumonia. Electronically Signed   By: Elmer Picker M.D.   On: 01/16/2022 15:37   DG Chest Port 1 View  Result Date: 01/13/2022 CLINICAL DATA:  Shortness of breath. EXAM: PORTABLE CHEST 1 VIEW COMPARISON:  01/07/2022 FINDINGS: 1421 hours. Retrocardiac left base collapse/consolidation with small to moderate left effusion is similar to prior. Tiny right pleural effusion evident. Interstitial markings are diffusely coarsened with chronic features. Telemetry leads overlie the chest. IMPRESSION: 1. Stable exam. Left base collapse/consolidation with left effusion. 2. Tiny right pleural effusion. Electronically Signed   By: Misty Stanley M.D.   On: 01/13/2022 15:03     Assessment and Plan:   Atrial fibrillation with RVR: The patient has had recurrent episodes of increased heart rate over the past week.  During his [facility] hospitalization he was started on Eliquis.  He was taken off carvedilol and initiated metoprolol 25 mg twice a day.  With his documented EF 55 to 60% on prior echo in June 2022, will give diltiazem 10 mg bolus now and if rate continues to be high initiate drip at 5 mg/h.  Continue Eliquis anticoagulation Probable chronic diastolic heart failure:  BNP elevated at 1314.  We will repeat echo Doppler study since last done 8 months ago End-stage renal disease: Patient has been on dialysis for 2 weeks but the last several dialysis have had inadequate fluid removal due to low blood pressure and increased heart rates.  Nephrology to see with dialysis tomorrow Hyperlipidemia: On rosuvastatin. 4.   History of prostate CA 5.  Macrocytic anemia: Consider checking B12 folate levels  Risk Assessment/Risk Scores:        New York Heart Association  (NYHA) Functional Class NYHA Class III  CHA2DS2-VASc Score = 3    This indicates a  % annual risk of stroke. The patient's score is based upon:           For questions or updates, please contact [place] Please consult [email or web address] for contact info under    Signed, Shelva Majestic, MD  01/16/2022 6:18 PM

## 2022-01-17 ENCOUNTER — Inpatient Hospital Stay (HOSPITAL_COMMUNITY): Payer: MEDICARE

## 2022-01-17 DIAGNOSIS — R778 Other specified abnormalities of plasma proteins: Secondary | ICD-10-CM | POA: Diagnosis not present

## 2022-01-17 DIAGNOSIS — I4891 Unspecified atrial fibrillation: Secondary | ICD-10-CM | POA: Diagnosis not present

## 2022-01-17 DIAGNOSIS — D649 Anemia, unspecified: Secondary | ICD-10-CM | POA: Diagnosis not present

## 2022-01-17 DIAGNOSIS — Z992 Dependence on renal dialysis: Secondary | ICD-10-CM

## 2022-01-17 DIAGNOSIS — E8770 Fluid overload, unspecified: Secondary | ICD-10-CM | POA: Diagnosis not present

## 2022-01-17 DIAGNOSIS — I5022 Chronic systolic (congestive) heart failure: Secondary | ICD-10-CM

## 2022-01-17 DIAGNOSIS — N186 End stage renal disease: Secondary | ICD-10-CM

## 2022-01-17 LAB — FERRITIN: Ferritin: 1552 ng/mL — ABNORMAL HIGH (ref 24–336)

## 2022-01-17 LAB — ECHOCARDIOGRAM COMPLETE
AR max vel: 3.73 cm2
AV Area VTI: 2.46 cm2
AV Area mean vel: 2.46 cm2
AV Mean grad: 8 mmHg
AV Peak grad: 7 mmHg
Ao pk vel: 1.32 m/s
Area-P 1/2: 4.08 cm2
Height: 68 in
S' Lateral: 3.6 cm
Single Plane A4C EF: 37.8 %
Weight: 2680 oz

## 2022-01-17 LAB — BASIC METABOLIC PANEL
Anion gap: 14 (ref 5–15)
BUN: 36 mg/dL — ABNORMAL HIGH (ref 8–23)
CO2: 25 mmol/L (ref 22–32)
Calcium: 8.4 mg/dL — ABNORMAL LOW (ref 8.9–10.3)
Chloride: 96 mmol/L — ABNORMAL LOW (ref 98–111)
Creatinine, Ser: 4.99 mg/dL — ABNORMAL HIGH (ref 0.61–1.24)
GFR, Estimated: 11 mL/min — ABNORMAL LOW (ref >60)
Glucose, Bld: 100 mg/dL — ABNORMAL HIGH (ref 70–99)
Potassium: 3.2 mmol/L — ABNORMAL LOW (ref 3.5–5.1)
Sodium: 135 mmol/L (ref 135–145)

## 2022-01-17 LAB — IRON AND TIBC
Iron: 34 ug/dL — ABNORMAL LOW (ref 45–182)
Saturation Ratios: 19 % (ref 17.9–39.5)
TIBC: 183 ug/dL — ABNORMAL LOW (ref 250–450)
UIBC: 149 ug/dL

## 2022-01-17 LAB — PROCALCITONIN: Procalcitonin: 0.28 ng/mL

## 2022-01-17 LAB — MAGNESIUM: Magnesium: 2 mg/dL (ref 1.7–2.4)

## 2022-01-17 LAB — MRSA NEXT GEN BY PCR, NASAL: MRSA by PCR Next Gen: DETECTED — AB

## 2022-01-17 MED ORDER — ADULT MULTIVITAMIN W/MINERALS CH
1.0000 | ORAL_TABLET | Freq: Every day | ORAL | Status: DC
  Administered 2022-01-17 – 2022-01-20 (×4): 1 via ORAL
  Filled 2022-01-17 (×4): qty 1

## 2022-01-17 MED ORDER — OMEGA-3-ACID ETHYL ESTERS 1 G PO CAPS
1.0000 g | ORAL_CAPSULE | Freq: Every day | ORAL | Status: DC
  Administered 2022-01-17 – 2022-01-20 (×5): 1 g via ORAL
  Filled 2022-01-17 (×5): qty 1

## 2022-01-17 MED ORDER — ALLOPURINOL 100 MG PO TABS
100.0000 mg | ORAL_TABLET | Freq: Every day | ORAL | Status: DC
  Administered 2022-01-17 – 2022-01-20 (×4): 100 mg via ORAL
  Filled 2022-01-17 (×4): qty 1

## 2022-01-17 MED ORDER — ALPRAZOLAM 0.5 MG PO TABS: 0.5000 mg | ORAL_TABLET | Freq: Two times a day (BID) | ORAL | Status: DC | PRN

## 2022-01-17 MED ORDER — ROSUVASTATIN CALCIUM 5 MG PO TABS
10.0000 mg | ORAL_TABLET | Freq: Every day | ORAL | Status: DC
  Administered 2022-01-17 – 2022-01-20 (×4): 10 mg via ORAL
  Filled 2022-01-17 (×4): qty 2

## 2022-01-17 MED ORDER — MIDODRINE HCL 5 MG PO TABS
5.0000 mg | ORAL_TABLET | ORAL | Status: DC
  Administered 2022-01-17: 5 mg via ORAL
  Filled 2022-01-17: qty 1

## 2022-01-17 MED ORDER — SERTRALINE HCL 100 MG PO TABS
100.0000 mg | ORAL_TABLET | Freq: Every day | ORAL | Status: DC
Start: 2022-01-17 — End: 2022-01-21
  Administered 2022-01-17 – 2022-01-21 (×5): 100 mg via ORAL
  Filled 2022-01-17 (×5): qty 1

## 2022-01-17 MED ORDER — SEVELAMER CARBONATE 800 MG PO TABS
800.0000 mg | ORAL_TABLET | Freq: Three times a day (TID) | ORAL | Status: DC
Start: 2022-01-17 — End: 2022-01-20
  Administered 2022-01-17 – 2022-01-20 (×9): 800 mg via ORAL
  Filled 2022-01-17 (×8): qty 1

## 2022-01-17 MED ORDER — SODIUM BICARBONATE 650 MG PO TABS
650.0000 mg | ORAL_TABLET | Freq: Three times a day (TID) | ORAL | Status: DC
  Administered 2022-01-17: 650 mg via ORAL
  Filled 2022-01-17: qty 1

## 2022-01-17 MED ORDER — ARIPIPRAZOLE 10 MG PO TABS
20.0000 mg | ORAL_TABLET | Freq: Every day | ORAL | Status: DC
  Administered 2022-01-17 – 2022-01-21 (×5): 20 mg via ORAL
  Filled 2022-01-17 (×5): qty 2

## 2022-01-17 MED ORDER — MIDODRINE HCL 5 MG PO TABS
10.0000 mg | ORAL_TABLET | ORAL | Status: DC
  Administered 2022-01-19 – 2022-01-20 (×2): 10 mg via ORAL
  Filled 2022-01-17 (×2): qty 2

## 2022-01-17 MED ORDER — ACETAMINOPHEN 325 MG PO TABS: 650.0000 mg | ORAL_TABLET | Freq: Four times a day (QID) | ORAL | Status: DC | PRN

## 2022-01-17 MED ORDER — APIXABAN 2.5 MG PO TABS
2.5000 mg | ORAL_TABLET | Freq: Two times a day (BID) | ORAL | Status: DC
  Administered 2022-01-17 – 2022-01-21 (×9): 2.5 mg via ORAL
  Filled 2022-01-17 (×9): qty 1

## 2022-01-17 MED ORDER — ACETAMINOPHEN 650 MG RE SUPP: 650.0000 mg | Freq: Four times a day (QID) | RECTAL | Status: DC | PRN

## 2022-01-17 NOTE — Plan of Care (Signed)
°  Problem: Education: Goal: Knowledge of General Education information will improve Description: Including pain rating scale, medication(s)/side effects and non-pharmacologic comfort measures 01/17/2022 1547 by Theotis Barrio, RN Outcome: Progressing 01/17/2022 1547 by Theotis Barrio, RN Outcome: Progressing   Problem: Health Behavior/Discharge Planning: Goal: Ability to manage health-related needs will improve 01/17/2022 1547 by Theotis Barrio, RN Outcome: Progressing 01/17/2022 1547 by Theotis Barrio, RN Outcome: Progressing   Problem: Clinical Measurements: Goal: Ability to maintain clinical measurements within normal limits will improve 01/17/2022 1547 by Theotis Barrio, RN Outcome: Progressing 01/17/2022 1547 by Theotis Barrio, RN Outcome: Progressing Goal: Will remain free from infection Outcome: Progressing Goal: Diagnostic test results will improve Outcome: Progressing Goal: Respiratory complications will improve Outcome: Progressing Goal: Cardiovascular complication will be avoided Outcome: Progressing   Problem: Activity: Goal: Risk for activity intolerance will decrease Outcome: Progressing   Problem: Nutrition: Goal: Adequate nutrition will be maintained Outcome: Progressing   Problem: Coping: Goal: Level of anxiety will decrease Outcome: Progressing   Problem: Elimination: Goal: Will not experience complications related to bowel motility Outcome: Progressing Goal: Will not experience complications related to urinary retention Outcome: Progressing   Problem: Pain Managment: Goal: General experience of comfort will improve Outcome: Progressing   Problem: Safety: Goal: Ability to remain free from injury will improve Outcome: Progressing   Problem: Skin Integrity: Goal: Risk for impaired skin integrity will decrease Outcome: Progressing   Problem: Education: Goal: Ability to demonstrate management of disease process will improve Outcome:  Progressing Goal: Ability to verbalize understanding of medication therapies will improve Outcome: Progressing Goal: Individualized Educational Video(s) Outcome: Progressing   Problem: Activity: Goal: Capacity to carry out activities will improve Outcome: Progressing   Problem: Cardiac: Goal: Ability to achieve and maintain adequate cardiopulmonary perfusion will improve Outcome: Progressing

## 2022-01-17 NOTE — Progress Notes (Addendum)
Progress Note  Patient Name: [name] [name] Date of Encounter: 01/17/2022  [facility] Cardiologist: None   Subjective   No specific complaints. Feels tired.   Inpatient Medications    Scheduled Meds:  allopurinol  100 mg Oral Daily   apixaban  2.5 mg Oral BID   ARIPiprazole  20 mg Oral Daily   Chlorhexidine Gluconate Cloth  6 each Topical Q0600   midodrine  5 mg Oral Q T,Th,S,Su   multivitamin with minerals  1 tablet Oral Daily   omega-3 acid ethyl esters  1 g Oral Daily   rosuvastatin  10 mg Oral Daily   sertraline  100 mg Oral Daily   sevelamer carbonate  800 mg Oral TID with meals   sodium bicarbonate  650 mg Oral TID   Continuous Infusions:  sodium chloride     sodium chloride     diltiazem (CARDIZEM) infusion 12.5 mg/hr (01/17/22 0748)   PRN Meds: sodium chloride, sodium chloride, acetaminophen **OR** [DISCONTINUED] acetaminophen, ALPRAZolam, alteplase, heparin, lidocaine (PF), lidocaine-prilocaine, pentafluoroprop-tetrafluoroeth   Vital Signs    Vitals:   01/17/22 0645 01/17/22 0730 01/17/22 0745 01/17/22 1000  BP: 135/89 (!) 151/90 127/73 113/67  Pulse: (!) 137 (!) 104 (!) 147 82  Resp: 18 17 20 13   Temp:      TempSrc:      SpO2: 95% 92% 93% 92%  Weight:      Height:        Intake/Output Summary (Last 24 hours) at 01/17/2022 1144 Last data filed at 01/16/2022 2207 Gross per 24 hour  Intake 50 ml  Output --  Net 50 ml   Last 3 Weights 01/16/2022 01/13/2022 01/09/2022  Weight (lbs) 167 lb 8 oz 174 lb 179 lb 0.2 oz  Weight (kg) 75.978 kg 78.926 kg 81.2 kg      Telemetry    Atrial fibrillation rates 90-110s - Personally Reviewed  ECG    No new tracing  Physical Exam   GEN: No acute distress.   Neck: No JVD Cardiac: Irreg Irreg, soft systolic murmur, no rubs, or gallops.  Respiratory: Clear to auscultation bilaterally. GI: Soft, nontender, non-distended  MS: No edema; No deformity. Left AV fistula Neuro:  Nonfocal  Psych: Normal affect    Labs    High Sensitivity Troponin:   Recent Labs  Lab 01/07/22 1118 01/07/22 1305 01/16/22 1455 01/16/22 1644  TROPONINIHS 8 9 21* 19*     Chemistry Recent Labs  Lab 01/13/22 1402 01/16/22 1455 01/16/22 2224 01/17/22 0711  NA 136 136 137 135  K 3.4* 3.3* 3.1* 3.2*  CL 98 97* 97* 96*  CO2 30 25 25 25   GLUCOSE 135* 130* 100* 100*  BUN 35* 31* 33* 36*  CREATININE 4.60* 4.51* 4.90* 4.99*  CALCIUM 8.3* 8.5* 8.4* 8.4*  MG  --   --   --  2.0  PROT 7.5  --   --   --   ALBUMIN 2.9*  --  2.5*  --   AST 33  --   --   --   ALT 27  --   --   --   ALKPHOS 88  --   --   --   BILITOT 0.4  --   --   --   GFRNONAA 12* 12* 11* 11*  ANIONGAP 8 14 15 14     Lipids No results for input(s): CHOL, TRIG, HDL, LABVLDL, LDLCALC, CHOLHDL in the last 168 hours.  Hematology Recent Labs  Lab 01/13/22  1402 01/16/22 1455 01/16/22 2224  WBC 9.5 8.2 7.2  RBC 3.17* 3.20* 3.04*  HGB 10.1* 10.2* 9.5*  HCT 32.4* 33.2* 31.1*  MCV 102.2* 103.8* 102.3*  MCH 31.9 31.9 31.3  MCHC 31.2 30.7 30.5  RDW 15.9* 15.8* 15.4  PLT 213 178 141*   Thyroid No results for input(s): TSH, FREET4 in the last 168 hours.  BNP Recent Labs  Lab 01/13/22 1402 01/16/22 1455  BNP 1,282.0* 1,314.4*    DDimer No results for input(s): DDIMER in the last 168 hours.   Radiology    DG Chest Portable 1 View  Result Date: 01/16/2022 CLINICAL DATA:  Shortness of breath EXAM: PORTABLE CHEST 1 VIEW COMPARISON:  Previous studies including the examination of 01/13/2022 FINDINGS: Transverse diameter of heart is increased. There are no signs of alveolar pulmonary edema. There is increased density in both lower lung fields, more so on the left side. This may suggest bilateral pleural effusions and underlying infiltrates, more so on the left side. There is increase in amount right pleural effusion. There is no pneumothorax. There is old ununited fracture in the left clavicle. IMPRESSION: Cardiomegaly. There are no signs of  alveolar pulmonary edema. There is increased density in both lower lung fields, more so on the left side consistent with bilateral pleural effusions and underlying atelectasis/pneumonia. Electronically Signed   By: Elmer Picker M.D.   On: 01/16/2022 15:37    Cardiac Studies   Echo: pending  Patient Profile     88 y.o. male with PMH of HTN, HLD, CKD, prostate CA who presented with atrial fibrillation   Assessment & Plan    Atrial fibrillation with RVR: remains in Afib, rates in the 90-110s. Has had trouble with low blood pressures with HD over the past 2 weeks. Had several meds stopped as a result of this.  -- continue Dilt drip for now pending echo results. May need to consider TEE/DCCV if unable to convert on meds -- recently started on Eliquis  -- echo pending  HFpEF: volume management per HD  ESRD on HD: on TTS schedule. Has had trouble with having enough fluid removed in the setting of low BPs.  -- per nephrology   HLD: on statin  HTN: most of his meds have been stopped with hypotension. Has been on midodrine with HD sessions  Hx of prostate CA  For questions or updates, please contact [place] Please consult [email or web address] for contact info under        Signed, Reino Bellis, NP  01/17/2022, 11:44 AM     Patient seen and examined. Agree with assessment and plan.Remains in Atrial fibrillation however ventricular rate much improved now in the upper 90s on Cardizem drip.  He is less short of breath today.  A 2D echo Doppler study was done, results still pending.  Plan is for dialysis later today.  We will not resume beta-blocker presently in light of need for dialysis and recent hypotension leading to cessation of therapy prior to completing dialysis but may need to resume on nondialysis days.   Troy Sine, MD, [facility] 01/17/2022 1:18 PM

## 2022-01-17 NOTE — Plan of Care (Signed)
°  Problem: Education: Goal: Knowledge of General Education information will improve Description: Including pain rating scale, medication(s)/side effects and non-pharmacologic comfort measures Outcome: Progressing   Problem: Clinical Measurements: Goal: Ability to maintain clinical measurements within normal limits will improve Outcome: Progressing   Problem: Clinical Measurements: Goal: Ability to maintain clinical measurements within normal limits will improve Outcome: Progressing Goal: Will remain free from infection Outcome: Progressing Goal: Diagnostic test results will improve Outcome: Progressing Goal: Respiratory complications will improve Outcome: Progressing Goal: Cardiovascular complication will be avoided Outcome: Progressing   Problem: Activity: Goal: Risk for activity intolerance will decrease Outcome: Progressing   Problem: Nutrition: Goal: Adequate nutrition will be maintained Outcome: Progressing   Problem: Coping: Goal: Level of anxiety will decrease Outcome: Progressing   Problem: Elimination: Goal: Will not experience complications related to bowel motility Outcome: Progressing Goal: Will not experience complications related to urinary retention Outcome: Progressing   Problem: Pain Managment: Goal: General experience of comfort will improve Outcome: Progressing   Problem: Safety: Goal: Ability to remain free from injury will improve Outcome: Progressing

## 2022-01-17 NOTE — Progress Notes (Signed)
[name] [name]  OZD:[id number] DOB: 03-12-1934 DOA: 01/16/2022 PCP: [name], MD    Brief Narrative:  [phone] w/ a hx of ESRD recently started on HD, anemia of CKD,  CHF, HTN, HDL, GERD, anxiety/depression, and prostate cancer in remission who was admitted at [facility] 2/6-01/09/2022 at which time HD was initiated. He returned to the ED with SOB in apparent fluid overload. EDW is ~160#. In the ED he was found to be in Afib w/ HR 150.    Consultants:  Nephrology Cardiology   Code Status: NO CODE BLUE   Antimicrobials:  none  DVT prophylaxis: Eliquis   Interim Hx: Appears comfortable in his hospital bed.  States he feels short of breath still but is slightly better than presentation.  Denies chest pain nausea vomiting abdominal pain  Assessment & Plan:  Atrial Fib w/ acute RVR Rate control meds being titrated by Cardiology -heart rate presently improved at 105 but not yet ideal  Acute exacerbation Chronic diastolic CHF  Volume management per dialysis  [facility] Weights   01/16/22 1444  Weight: 76 kg    ESRD - volume overload  Hypotension has reportedly been interfering with effective HD tx -dialysis per nephrology -follow blood pressure trend  Anemia of chronic kidney disease  No evidence of acute blood loss -follow hemoglobin  Macrocytosis Check G38 and folic acid levels  Hypokalemia  Avoid supplementation for now and follow trend  HLD  Cont rosuvastatin   Hx of Prostate CA   Family Communication: No family present at time of exam Disposition: from home -anticipate return to home when medically stable  Objective: Blood pressure 127/73, pulse (!) 147, temperature 98.2 F (36.8 C), temperature source Oral, resp. rate 20, height 5\' 8"  (1.727 m), weight 76 kg, SpO2 93 %.  Intake/Output Summary (Last 24 hours) at 01/17/2022 0806 Last data filed at 01/16/2022 2207 Gross per 24 hour  Intake 50 ml  Output --  Net 50 ml   Filed Weights   01/16/22 1444  Weight: 76 kg     Examination: General: No acute respiratory distress Lungs: Clear to auscultation bilaterally without wheezes or crackles Cardiovascular: Irregularly irregular with rate approximately 105 Abdomen: Nontender, nondistended, soft, bowel sounds positive, no rebound, no ascites, no appreciable mass Extremities: No significant cyanosis, clubbing, or edema bilateral lower extremities  CBC: Recent Labs  Lab 01/13/22 1402 01/16/22 1455 01/16/22 2224  WBC 9.5 8.2 7.2  NEUTROABS 8.2* 6.9  --   HGB 10.1* 10.2* 9.5*  HCT 32.4* 33.2* 31.1*  MCV 102.2* 103.8* 102.3*  PLT 213 178 756*   Basic Metabolic Panel: Recent Labs  Lab 01/16/22 1455 01/16/22 2224 01/17/22 0711  NA 136 137 135  K 3.3* 3.1* 3.2*  CL 97* 97* 96*  CO2 25 25 25   GLUCOSE 130* 100* 100*  BUN 31* 33* 36*  CREATININE 4.51* 4.90* 4.99*  CALCIUM 8.5* 8.4* 8.4*  MG  --   --  2.0  PHOS  --  3.5  --    GFR: Estimated Creatinine Clearance: 10.5 mL/min (A) (by C-G formula based on SCr of 4.99 mg/dL (H)).  Liver Function Tests: Recent Labs  Lab 01/13/22 1402 01/16/22 2224  AST 33  --   ALT 27  --   ALKPHOS 88  --   BILITOT 0.4  --   PROT 7.5  --   ALBUMIN 2.9* 2.5*   HbA1C: Hgb A1c MFr Bld  Date/Time Value Ref Range Status  05/29/2020 03:24 PM 5.5 4.8 -  5.6 % Final    Comment:    (NOTE)         Prediabetes: 5.7 - 6.4         Diabetes: >6.4         Glycemic control for adults with diabetes: <7.0     Scheduled Meds:  Chlorhexidine Gluconate Cloth  6 each Topical Q0600   Continuous Infusions:  sodium chloride     sodium chloride     diltiazem (CARDIZEM) infusion 12.5 mg/hr (01/17/22 0748)     LOS: 1 day   Cherene Altes, MD [facility] Office  [phone] Pager - Text Page per Shea Evans  If 7PM-7AM, please contact night-coverage per Amion 01/17/2022, 8:06 AM

## 2022-01-17 NOTE — Progress Notes (Signed)
Echocardiogram 2D Echocardiogram has been performed.  Jefferey Pica 01/17/2022, 10:17 AM

## 2022-01-17 NOTE — Progress Notes (Signed)
[facility] NEPHROLOGY PROGRESS NOTE  Assessment/ Plan: Pt is a 88 y.o. yo male  HTN, CHF, hyperlipidemia, and ESRD (was recently started dialysis as an OP-  TTS- [facility]).  He came into the [facility] ED today with complaints of SOB.  Multiple recent hospitalization for similar presentation.  # ESRD TTS at [facility]: Apparently dialysis was started about 2 weeks ago, unable to UF due to intradialytic hypotension and A-fib with RVR.  We will try to do ultrafiltration today if tolerated.  #A-fib with RVR: Cardiology is following.  On diltiazem drip and Eliquis.  May consider cardioversion.  #Anemia of ESRD: Monitor hemoglobin.  I will check iron level and decide ESA.  #CKD-MBD: Phosphorus level acceptable, continue binders/sevelamer.  Monitor lab.  # HTN/volume: BP low, increase midodrine to 10 mg.  May need to lower dialysate temperature for intradialytic hypotension.  UF as tolerated.  #Disposition: Patient with new ESRD with recurrent multiple admission.  Not really tolerating dialysis because of hypotension and A-fib with RVR.  Noted he is DNR/DNI.  Recommend palliative care consult to discuss ongoing goals of care.  Subjective: Seen and examined in ER.  The caregiver was present at bedside.  Patient reports tired but denies chest pain or shortness of breath.  No new event.  Seen by cardiologist earlier. Objective Vital signs in last 24 hours: Vitals:   01/17/22 0745 01/17/22 1000 01/17/22 1200 01/17/22 1230  BP: 127/73 113/67 (!) 123/53 (!) 104/53  Pulse: (!) 147 82 (!) 101 81  Resp: 20 13 19 15   Temp:      TempSrc:      SpO2: 93% 92% 95% 97%  Weight:      Height:       Weight change:   Intake/Output Summary (Last 24 hours) at 01/17/2022 1315 Last data filed at 01/16/2022 2207 Gross per 24 hour  Intake 50 ml  Output --  Net 50 ml       Labs: Basic Metabolic Panel: Recent Labs  Lab 01/16/22 1455 01/16/22 2224 01/17/22 0711  NA 136 137 135   K 3.3* 3.1* 3.2*  CL 97* 97* 96*  CO2 25 25 25   GLUCOSE 130* 100* 100*  BUN 31* 33* 36*  CREATININE 4.51* 4.90* 4.99*  CALCIUM 8.5* 8.4* 8.4*  PHOS  --  3.5  --    Liver Function Tests: Recent Labs  Lab 01/13/22 1402 01/16/22 2224  AST 33  --   ALT 27  --   ALKPHOS 88  --   BILITOT 0.4  --   PROT 7.5  --   ALBUMIN 2.9* 2.5*   No results for input(s): LIPASE, AMYLASE in the last 168 hours. No results for input(s): AMMONIA in the last 168 hours. CBC: Recent Labs  Lab 01/13/22 1402 01/16/22 1455 01/16/22 2224  WBC 9.5 8.2 7.2  NEUTROABS 8.2* 6.9  --   HGB 10.1* 10.2* 9.5*  HCT 32.4* 33.2* 31.1*  MCV 102.2* 103.8* 102.3*  PLT 213 178 141*   Cardiac Enzymes: No results for input(s): CKTOTAL, CKMB, CKMBINDEX, TROPONINI in the last 168 hours. CBG: No results for input(s): GLUCAP in the last 168 hours.  Iron Studies: No results for input(s): IRON, TIBC, TRANSFERRIN, FERRITIN in the last 72 hours. Studies/Results: DG Chest Portable 1 View  Result Date: 01/16/2022 CLINICAL DATA:  Shortness of breath EXAM: PORTABLE CHEST 1 VIEW COMPARISON:  Previous studies including the examination of 01/13/2022 FINDINGS: Transverse diameter of heart is increased. There are no  signs of alveolar pulmonary edema. There is increased density in both lower lung fields, more so on the left side. This may suggest bilateral pleural effusions and underlying infiltrates, more so on the left side. There is increase in amount right pleural effusion. There is no pneumothorax. There is old ununited fracture in the left clavicle. IMPRESSION: Cardiomegaly. There are no signs of alveolar pulmonary edema. There is increased density in both lower lung fields, more so on the left side consistent with bilateral pleural effusions and underlying atelectasis/pneumonia. Electronically Signed   By: Elmer Picker M.D.   On: 01/16/2022 15:37    Medications: Infusions:  sodium chloride     sodium chloride      diltiazem (CARDIZEM) infusion 12.5 mg/hr (01/17/22 0748)    Scheduled Medications:  allopurinol  100 mg Oral Daily   apixaban  2.5 mg Oral BID   ARIPiprazole  20 mg Oral Daily   Chlorhexidine Gluconate Cloth  6 each Topical Q0600   midodrine  5 mg Oral Q T,Th,S,Su   multivitamin with minerals  1 tablet Oral Daily   omega-3 acid ethyl esters  1 g Oral Daily   rosuvastatin  10 mg Oral Daily   sertraline  100 mg Oral Daily   sevelamer carbonate  800 mg Oral TID with meals   sodium bicarbonate  650 mg Oral TID    have reviewed scheduled and prn medications.  Physical Exam: General: Frail elderly male, lying on bed Heart: Irregular, tachycardic, s1s2 nl Lungs:clear b/l, no crackle Abdomen:soft, Non-tender, non-distended Extremities:No edema Dialysis Access: Left arm AV graft has good thrill and bruit  Leandre Wien Prasad Damonte Frieson 01/17/2022,1:15 PM  LOS: 1 day

## 2022-01-18 DIAGNOSIS — I509 Heart failure, unspecified: Secondary | ICD-10-CM

## 2022-01-18 DIAGNOSIS — N186 End stage renal disease: Secondary | ICD-10-CM | POA: Diagnosis not present

## 2022-01-18 DIAGNOSIS — I3139 Other pericardial effusion (noninflammatory): Secondary | ICD-10-CM

## 2022-01-18 DIAGNOSIS — I4891 Unspecified atrial fibrillation: Secondary | ICD-10-CM | POA: Diagnosis not present

## 2022-01-18 DIAGNOSIS — D649 Anemia, unspecified: Secondary | ICD-10-CM | POA: Diagnosis not present

## 2022-01-18 LAB — COMPREHENSIVE METABOLIC PANEL
ALT: 30 U/L (ref 0–44)
AST: 43 U/L — ABNORMAL HIGH (ref 15–41)
Albumin: 2.6 g/dL — ABNORMAL LOW (ref 3.5–5.0)
Alkaline Phosphatase: 76 U/L (ref 38–126)
Anion gap: 12 (ref 5–15)
BUN: 13 mg/dL (ref 8–23)
CO2: 26 mmol/L (ref 22–32)
Calcium: 8.3 mg/dL — ABNORMAL LOW (ref 8.9–10.3)
Chloride: 98 mmol/L (ref 98–111)
Creatinine, Ser: 2.51 mg/dL — ABNORMAL HIGH (ref 0.61–1.24)
GFR, Estimated: 24 mL/min — ABNORMAL LOW (ref >60)
Glucose, Bld: 107 mg/dL — ABNORMAL HIGH (ref 70–99)
Potassium: 3.7 mmol/L (ref 3.5–5.1)
Sodium: 136 mmol/L (ref 135–145)
Total Bilirubin: 0.9 mg/dL (ref 0.3–1.2)
Total Protein: 7.1 g/dL (ref 6.5–8.1)

## 2022-01-18 LAB — CBC
HCT: 32.6 % — ABNORMAL LOW (ref 39.0–52.0)
Hemoglobin: 10.5 g/dL — ABNORMAL LOW (ref 13.0–17.0)
MCH: 31.5 pg (ref 26.0–34.0)
MCHC: 32.2 g/dL (ref 30.0–36.0)
MCV: 97.9 fL (ref 80.0–100.0)
Platelets: 180 10*3/uL (ref 150–400)
RBC: 3.33 MIL/uL — ABNORMAL LOW (ref 4.22–5.81)
RDW: 15.5 % (ref 11.5–15.5)
WBC: 8.6 10*3/uL (ref 4.0–10.5)
nRBC: 0 % (ref 0.0–0.2)

## 2022-01-18 LAB — IRON AND TIBC
Iron: 55 ug/dL (ref 45–182)
Saturation Ratios: 28 % (ref 17.9–39.5)
TIBC: 196 ug/dL — ABNORMAL LOW (ref 250–450)
UIBC: 141 ug/dL

## 2022-01-18 LAB — RETICULOCYTES
Immature Retic Fract: 27.4 % — ABNORMAL HIGH (ref 2.3–15.9)
RBC.: 3.28 MIL/uL — ABNORMAL LOW (ref 4.22–5.81)
Retic Count, Absolute: 65.9 10*3/uL (ref 19.0–186.0)
Retic Ct Pct: 2 % (ref 0.4–3.1)

## 2022-01-18 LAB — FERRITIN: Ferritin: 1472 ng/mL — ABNORMAL HIGH (ref 24–336)

## 2022-01-18 LAB — HEPATITIS B SURFACE ANTIBODY, QUANTITATIVE: Hep B S AB Quant (Post): <3.1 m[IU]/mL — ABNORMAL LOW (ref >9.9)

## 2022-01-18 LAB — VITAMIN B12: Vitamin B-12: 1012 pg/mL — ABNORMAL HIGH (ref 180–914)

## 2022-01-18 LAB — FOLATE: Folate: 19.8 ng/mL (ref >5.9)

## 2022-01-18 MED ORDER — LORATADINE 10 MG PO TABS
10.0000 mg | ORAL_TABLET | Freq: Every day | ORAL | Status: DC | PRN
Start: 2022-01-18 — End: 2022-01-20

## 2022-01-18 MED ORDER — GUAIFENESIN-DM 100-10 MG/5ML PO SYRP
5.0000 mL | ORAL_SOLUTION | ORAL | Status: DC | PRN
  Administered 2022-01-18 – 2022-01-20 (×5): 5 mL via ORAL
  Filled 2022-01-18 (×6): qty 5

## 2022-01-18 MED ORDER — METOPROLOL TARTRATE 12.5 MG HALF TABLET
12.5000 mg | ORAL_TABLET | Freq: Two times a day (BID) | ORAL | Status: DC
  Administered 2022-01-18: 12.5 mg via ORAL
  Filled 2022-01-18: qty 1

## 2022-01-18 NOTE — Plan of Care (Signed)

## 2022-01-18 NOTE — Progress Notes (Signed)
HARTFORD MAULDEN  UXL:[id number] DOB: December 08, 1933 DOA: 01/16/2022 PCP: [name], MD    Brief Narrative:  [phone] w/ a hx of ESRD recently started on HD, anemia of CKD,  CHF, HTN, HDL, GERD, anxiety/depression, and prostate cancer in remission who was admitted at [facility] 2/6-01/09/2022 at which time HD was initiated. He returned to the ED with SOB in apparent fluid overload. EDW is ~160#. In the ED he was found to be in Afib w/ HR 150.    Consultants:  Nephrology Cardiology   Code Status: NO CODE BLUE   Antimicrobials:  none  DVT prophylaxis: Eliquis   Interim Hx: Afebrile.  Vital signs stable.  Heart rate better controlled at 78-110.  Saturations marginal at 86-92% on 2.5 L nasal cannula support.  TTE noted a large circumferential pericardial effusion.  Sitting up at the bedside in good spirits.  States he feels somewhat better today.  Affect remains flat.  No apparent respiratory distress.  Assessment & Plan:  Atrial Fib w/ acute RVR Rate control meds being titrated by Cardiology -remains on Cardizem drip - heart rate presently improved -continue Eliquis -to attempt to wean to oral beta-blocker today  Large circumferential pericardial effusion Noted on TTE this admission -no evidence of tamponade -plan is for fluid removal via dialysis and monitor  Acute exacerbation Chronic combined systolic and diastolic CHF  Volume management per dialysis -TTE noted EF 40-45% which is worsened from 55-60% June 2022  [place] Weights   01/16/22 1444 01/17/22 2030 01/18/22 0030  Weight: 76 kg 78.2 kg 76 kg    ESRD - volume overload  Hypotension has reportedly been interfering with effective HD tx -dialysis per Nephrology -follow blood pressure trend  Anemia of chronic kidney disease  No evidence of acute blood loss -follow hemoglobin  Macrocytosis Y40 and folic acid levels not low  Hypokalemia  Corrected with dialysis  HLD  Cont rosuvastatin   Hx of Prostate CA   Family Communication:  Spoke with family at bedside Disposition: from home - anticipate return to home when medically stable  Objective: Blood pressure 112/60, pulse 97, temperature 97.8 F (36.6 C), temperature source Oral, resp. rate 19, height 5\' 8"  (1.727 m), weight 76 kg, SpO2 92 %.  Intake/Output Summary (Last 24 hours) at 01/18/2022 1041 Last data filed at 01/18/2022 0030 Gross per 24 hour  Intake 365 ml  Output 2020 ml  Net -1655 ml    Filed Weights   01/16/22 1444 01/17/22 2030 01/18/22 0030  Weight: 76 kg 78.2 kg 76 kg    Examination: General: No acute respiratory distress Lungs: Faint bibasilar crackles Cardiovascular: Irregularly irregular with rate 90 Abdomen: Nontender, nondistended, soft, bowel sounds positive, no rebound Extremities: No significant edema bilateral lower extremities  CBC: Recent Labs  Lab 01/13/22 1402 01/16/22 1455 01/16/22 2224 01/18/22 0056  WBC 9.5 8.2 7.2 8.6  NEUTROABS 8.2* 6.9  --   --   HGB 10.1* 10.2* 9.5* 10.5*  HCT 32.4* 33.2* 31.1* 32.6*  MCV 102.2* 103.8* 102.3* 97.9  PLT 213 178 141* 347    Basic Metabolic Panel: Recent Labs  Lab 01/16/22 2224 01/17/22 0711 01/18/22 0056  NA 137 135 136  K 3.1* 3.2* 3.7  CL 97* 96* 98  CO2 25 25 26   GLUCOSE 100* 100* 107*  BUN 33* 36* 13  CREATININE 4.90* 4.99* 2.51*  CALCIUM 8.4* 8.4* 8.3*  MG  --  2.0  --   PHOS 3.5  --   --  GFR: Estimated Creatinine Clearance: 20.8 mL/min (A) (by C-G formula based on SCr of 2.51 mg/dL (H)).  Liver Function Tests: Recent Labs  Lab 01/13/22 1402 01/16/22 2224 01/18/22 0056  AST 33  --  43*  ALT 27  --  30  ALKPHOS 88  --  76  BILITOT 0.4  --  0.9  PROT 7.5  --  7.1  ALBUMIN 2.9* 2.5* 2.6*    HbA1C: Hgb A1c MFr Bld  Date/Time Value Ref Range Status  05/29/2020 03:24 PM 5.5 4.8 - 5.6 % Final    Comment:    (NOTE)         Prediabetes: 5.7 - 6.4         Diabetes: >6.4         Glycemic control for adults with diabetes: <7.0     Scheduled  Meds:  allopurinol  100 mg Oral Daily   apixaban  2.5 mg Oral BID   ARIPiprazole  20 mg Oral Daily   Chlorhexidine Gluconate Cloth  6 each Topical Q0600   [START ON 01/19/2022] midodrine  10 mg Oral Q T,Th,S,Su   multivitamin with minerals  1 tablet Oral Daily   omega-3 acid ethyl esters  1 g Oral Daily   rosuvastatin  10 mg Oral Daily   sertraline  100 mg Oral Daily   sevelamer carbonate  800 mg Oral TID with meals   Continuous Infusions:  sodium chloride     sodium chloride     diltiazem (CARDIZEM) infusion 12.5 mg/hr (01/18/22 1035)     LOS: 2 days   Cherene Altes, MD [facility] Office  [phone] Pager - Text Page per Shea Evans  If 7PM-7AM, please contact night-coverage per Amion 01/18/2022, 10:41 AM

## 2022-01-18 NOTE — Progress Notes (Signed)
Progress Note  Patient Name: [name] [name] Date of Encounter: 01/18/2022  [facility] Cardiologist: None   Subjective   Feels better this morning.  Had dialysis in the room yesterday and approximately 2.2 L of fluid removed according to nurse  Inpatient Medications    Scheduled Meds:  allopurinol  100 mg Oral Daily   apixaban  2.5 mg Oral BID   ARIPiprazole  20 mg Oral Daily   Chlorhexidine Gluconate Cloth  6 each Topical Q0600   [START ON 01/19/2022] midodrine  10 mg Oral Q T,Th,S,Su   multivitamin with minerals  1 tablet Oral Daily   omega-3 acid ethyl esters  1 g Oral Daily   rosuvastatin  10 mg Oral Daily   sertraline  100 mg Oral Daily   sevelamer carbonate  800 mg Oral TID with meals   Continuous Infusions:  sodium chloride     sodium chloride     diltiazem (CARDIZEM) infusion 12.5 mg/hr (01/17/22 1600)   PRN Meds: sodium chloride, sodium chloride, acetaminophen **OR** [DISCONTINUED] acetaminophen, ALPRAZolam, alteplase, guaiFENesin-dextromethorphan, heparin, lidocaine (PF), lidocaine-prilocaine, loratadine, pentafluoroprop-tetrafluoroeth   Vital Signs    Vitals:   01/18/22 0010 01/18/22 0030 01/18/22 0402 01/18/22 0700  BP:  (!) 117/54 (!) 95/56 112/60  Pulse: 80 79 78 97  Resp: 15 18 20 19   Temp:  (!) 97.5 F (36.4 C) 97.7 F (36.5 C) 97.8 F (36.6 C)  TempSrc:  Oral Oral Oral  SpO2: 97% 94% 98% 92%  Weight:  76 kg    Height:        Intake/Output Summary (Last 24 hours) at 01/18/2022 0951 Last data filed at 01/18/2022 0030 Gross per 24 hour  Intake 365 ml  Output 2020 ml  Net -1655 ml   Last 3 Weights 01/18/2022 01/17/2022 01/16/2022  Weight (lbs) 167 lb 8.8 oz 172 lb 6.4 oz 167 lb 8 oz  Weight (kg) 76 kg 78.2 kg 75.978 kg      Telemetry    Atrial fibrillation rates now in the 90s- Personally Reviewed  ECG    No new tracing  Physical Exam   GEN: Comfortable today, no distress Neck: Significant JVD Cardiac: Irregularly irregular  consistent with atrial fibrillation rate in the upper 80s to low 09G; 1-2/systolic murmur lower sternal border and apex; no friction rub Respiratory: No wheezing or rales GI: Nontender MS: No edema; No deformity. Left AV fistula Neuro: Nonfocal Psych: Normal affect  Labs    High Sensitivity Troponin:   Recent Labs  Lab 01/07/22 1118 01/07/22 1305 01/16/22 1455 01/16/22 1644  TROPONINIHS 8 9 21* 19*     Chemistry Recent Labs  Lab 01/13/22 1402 01/16/22 1455 01/16/22 2224 01/17/22 0711 01/18/22 0056  NA 136   < > 137 135 136  K 3.4*   < > 3.1* 3.2* 3.7  CL 98   < > 97* 96* 98  CO2 30   < > 25 25 26   GLUCOSE 135*   < > 100* 100* 107*  BUN 35*   < > 33* 36* 13  CREATININE 4.60*   < > 4.90* 4.99* 2.51*  CALCIUM 8.3*   < > 8.4* 8.4* 8.3*  MG  --   --   --  2.0  --   PROT 7.5  --   --   --  7.1  ALBUMIN 2.9*  --  2.5*  --  2.6*  AST 33  --   --   --  43*  ALT  27  --   --   --  30  ALKPHOS 88  --   --   --  76  BILITOT 0.4  --   --   --  0.9  GFRNONAA 12*   < > 11* 11* 24*  ANIONGAP 8   < > 15 14 12    < > = values in this interval not displayed.    Lipids No results for input(s): CHOL, TRIG, HDL, LABVLDL, LDLCALC, CHOLHDL in the last 168 hours.  Hematology Recent Labs  Lab 01/16/22 1455 01/16/22 2224 01/18/22 0056  WBC 8.2 7.2 8.6  RBC 3.20* 3.04* 3.33*   3.28*  HGB 10.2* 9.5* 10.5*  HCT 33.2* 31.1* 32.6*  MCV 103.8* 102.3* 97.9  MCH 31.9 31.3 31.5  MCHC 30.7 30.5 32.2  RDW 15.8* 15.4 15.5  PLT 178 141* 180   Thyroid No results for input(s): TSH, FREET4 in the last 168 hours.  BNP Recent Labs  Lab 01/13/22 1402 01/16/22 1455  BNP 1,282.0* 1,314.4*    DDimer No results for input(s): DDIMER in the last 168 hours.   Radiology    DG Chest Portable 1 View  Result Date: 01/16/2022 CLINICAL DATA:  Shortness of breath EXAM: PORTABLE CHEST 1 VIEW COMPARISON:  Previous studies including the examination of 01/13/2022 FINDINGS: Transverse diameter of heart is  increased. There are no signs of alveolar pulmonary edema. There is increased density in both lower lung fields, more so on the left side. This may suggest bilateral pleural effusions and underlying infiltrates, more so on the left side. There is increase in amount right pleural effusion. There is no pneumothorax. There is old ununited fracture in the left clavicle. IMPRESSION: Cardiomegaly. There are no signs of alveolar pulmonary edema. There is increased density in both lower lung fields, more so on the left side consistent with bilateral pleural effusions and underlying atelectasis/pneumonia. Electronically Signed   By: Elmer Picker M.D.   On: 01/16/2022 15:37   ECHOCARDIOGRAM COMPLETE  Result Date: 01/17/2022    ECHOCARDIOGRAM REPORT   Patient Name:   [name] [name] Date of Exam: 01/17/2022 Medical Rec #:  [id number]       Height:       68.0 in Accession #:    [id number]      Weight:       167.5 lb Date of Birth:  [birth year]      BSA:          1.895 m Patient Age:    89 years        BP:           113/67 mmHg Patient Gender: M               HR:           94 bpm. Exam Location:  Inpatient Procedure: 2D Echo Indications:    CHF  History:        Patient has prior history of Echocardiogram examinations, most                 recent 05/07/2021. CAD; Arrythmias:Atrial Fibrillation.  Sonographer:    Jefferey Pica Referring Phys: [id number] [place]  1. A large circumferential pericardial effusion is present measuring up to 2.7cm at end-diastole. There is mild respiratory variation across the mitral valve. There is no RA or RV collapse. IVC is dilated with RAP 42mmHg. Recommend clinical correlation.  2. Left ventricular ejection fraction, by estimation, is 40 to 45%. The left  ventricle has mildly decreased function. The left ventricle demonstrates global hypokinesis. There is mild concentric left ventricular hypertrophy. Diastolic function is indeterminant due to AFib.  3. Right ventricular  systolic function is mildly reduced. The right ventricular size is normal. There is moderately elevated pulmonary artery systolic pressure. The estimated right ventricular systolic pressure is 28.4 mmHg.  4. Left atrial size was mild to moderately dilated.  5. Right atrial size was mildly dilated.  6. Large pericardial effusion. The pericardial effusion is circumferential.  7. The mitral valve is normal in structure. Trivial mitral valve regurgitation.  8. Tricuspid valve regurgitation is mild to moderate.  9. The aortic valve is tricuspid. There is moderate calcification of the aortic valve. There is moderate thickening of the aortic valve. Aortic valve regurgitation is not visualized. Mild aortic valve stenosis. 10. The inferior vena cava is dilated in size with <50% respiratory variability, suggesting right atrial pressure of 15 mmHg. Comparison(s): Compared to prior TTE in 05/2021, there is now a large pericardial effusion and the LVEF appears less vigorous (40-45%; previously 55-60%). FINDINGS  Left Ventricle: Left ventricular ejection fraction, by estimation, is 40 to 45%. The left ventricle has mildly decreased function. The left ventricle demonstrates global hypokinesis. The left ventricular internal cavity size was normal in size. There is  mild concentric left ventricular hypertrophy. Diastolic function is indeterminant due to AFib. Right Ventricle: The right ventricular size is normal. No increase in right ventricular wall thickness. Right ventricular systolic function is mildly reduced. There is moderately elevated pulmonary artery systolic pressure. The tricuspid regurgitant velocity is 2.92 m/s, and with an assumed right atrial pressure of 15 mmHg, the estimated right ventricular systolic pressure is 13.2 mmHg. Left Atrium: Left atrial size was mild to moderately dilated. Right Atrium: Right atrial size was mildly dilated. Pericardium: Measures up to 2.9cm at end diastole. A large pericardial effusion  is present. The pericardial effusion is circumferential. Mitral Valve: The mitral valve is normal in structure. There is mild thickening of the mitral valve leaflet(s). Mild mitral annular calcification. Trivial mitral valve regurgitation. Tricuspid Valve: The tricuspid valve is normal in structure. Tricuspid valve regurgitation is mild to moderate. Aortic Valve: The aortic valve is tricuspid. There is moderate calcification of the aortic valve. There is moderate thickening of the aortic valve. Aortic valve regurgitation is not visualized. Mild aortic stenosis is present. Aortic valve mean gradient measures 8.0 mmHg. Aortic valve peak gradient measures 7.0 mmHg. Aortic valve area, by VTI measures 2.46 cm. Pulmonic Valve: The pulmonic valve was normal in structure. Pulmonic valve regurgitation is trivial. Aorta: The aortic root and ascending aorta are structurally normal, with no evidence of dilitation. Venous: The inferior vena cava is dilated in size with less than 50% respiratory variability, suggesting right atrial pressure of 15 mmHg. IAS/Shunts: The atrial septum is grossly normal. Additional Comments: There is a small pleural effusion in the left lateral region.  LEFT VENTRICLE PLAX 2D LVIDd:         4.50 cm      Diastology LVIDs:         3.60 cm      LV e' medial:    8.55 cm/s LV PW:         1.20 cm      LV E/e' medial:  9.5 LV IVS:        1.20 cm      LV e' lateral:   8.93 cm/s LVOT diam:     2.00 cm  LV E/e' lateral: 9.1 LV SV:         87 LV SV Index:   46 LVOT Area:     3.14 cm  LV Volumes (MOD) LV vol d, MOD A4C: 125.0 ml LV vol s, MOD A4C: 77.7 ml LV SV MOD A4C:     125.0 ml IVC IVC diam: 2.80 cm LEFT ATRIUM             Index        RIGHT ATRIUM           Index LA diam:        3.80 cm 2.00 cm/m   RA Area:     16.60 cm LA Vol (A2C):   63.2 ml 33.34 ml/m  RA Volume:   40.55 ml  21.39 ml/m LA Vol (A4C):   40.3 ml 21.26 ml/m LA Biplane Vol: 51.5 ml 27.17 ml/m  AORTIC VALVE                      PULMONIC VALVE AV Area (Vmax):    3.73 cm      PV Vmax:       0.86 m/s AV Area (Vmean):   2.46 cm      PV Peak grad:  3.0 mmHg AV Area (VTI):     2.46 cm AV Vmax:           132.25 cm/s AV Vmean:          130.000 cm/s AV VTI:            0.355 m AV Peak Grad:      7.0 mmHg AV Mean Grad:      8.0 mmHg LVOT Vmax:         157.00 cm/s LVOT Vmean:        102.000 cm/s LVOT VTI:          0.278 m LVOT/AV VTI ratio: 0.78  AORTA Ao Root diam: 3.50 cm Ao Asc diam:  3.50 cm MITRAL VALVE               TRICUSPID VALVE MV Area (PHT): 4.08 cm    TR Peak grad:   34.1 mmHg MV Decel Time: 186 msec    TR Vmax:        292.00 cm/s MV E velocity: 81.00 cm/s                            SHUNTS                            Systemic VTI:  0.28 m                            Systemic Diam: 2.00 cm Gwyndolyn Kaufman MD Electronically signed by Gwyndolyn Kaufman MD Signature Date/Time: 01/17/2022/2:03:43 PM    Final     Cardiac Studies   ECHO: 01/17/2022 IMPRESSIONS   1. A large circumferential pericardial effusion is present measuring up  to 2.7cm at end-diastole. There is mild respiratory variation across the  mitral valve. There is no RA or RV collapse. IVC is dilated with RAP  77mmHg. Recommend clinical correlation.   2. Left ventricular ejection fraction, by estimation, is 40 to 45%. The  left ventricle has mildly decreased function. The left ventricle  demonstrates global hypokinesis. There is mild concentric left ventricular  hypertrophy. Diastolic function is  indeterminant due to AFib.   3. Right ventricular systolic function is mildly reduced. The right  ventricular size is normal. There is moderately elevated pulmonary artery  systolic pressure. The estimated right ventricular systolic pressure is  38.8 mmHg.   4. Left atrial size was mild to moderately dilated.   5. Right atrial size was mildly dilated.   6. Large pericardial effusion. The pericardial effusion is  circumferential.   7. The mitral valve is normal in  structure. Trivial mitral valve  regurgitation.   8. Tricuspid valve regurgitation is mild to moderate.   9. The aortic valve is tricuspid. There is moderate calcification of the  aortic valve. There is moderate thickening of the aortic valve. Aortic  valve regurgitation is not visualized. Mild aortic valve stenosis.  10. The inferior vena cava is dilated in size with <50% respiratory  variability, suggesting right atrial pressure of 15 mmHg.   Comparison(s): Compared to prior TTE in 05/2021, there is now a large  pericardial effusion and the LVEF appears less vigorous (40-45%;  previously 55-60%).   Patient Profile     88 y.o. male with PMH of HTN, HLD, CKD, prostate CA who presented with atrial fibrillation   Assessment & Plan    Atrial fibrillation with RVR: remains in Afib, rates are improved and he continues to be on Cardizem drip at 12.5 mg/h.  BP continues to be somewhat soft.  Will try to re-initiate low-dose metoprolol tartrate 12.5 mg twice a day and plan to gradually decrease IV Cardizem as blood pressure and heart rate allow. May need to consider TEE/DCCV if unable to convert on meds -- recently started on Eliquis   Pericardial effusion: Echo shows large circumferential pericardial effusion without findings of tamponade.  2.2 L of fluid removed yesterday with dialysis session last evening.  Hopefully with adequate fluid removal effusion will decrease.  Patient is on Eliquis.  No findings of tamponade.  Combined systolic and diastolic heart failure: Echo yesterday now shows EF at 40 to 45%.  Previously he was noted to have EF 55 to 60% on prior echo in June 2022.  ESRD on HD: on TTS schedule. Has had trouble with having enough fluid removed in the setting of low BPs.  -- Underwent dialysis last evening with 2.2 L of fluid removed.  He feels better today.  HLD: on statin  HTN: most of his meds have been stopped with hypotension. Has been on midodrine with HD sessions  Hx of  prostate CA  For questions or updates, please contact [place] Please consult [email or web address] for contact info under        Signed, Shelva Majestic, MD  01/18/2022, 9:51 AM

## 2022-01-18 NOTE — Progress Notes (Signed)
[facility] NEPHROLOGY PROGRESS NOTE  Assessment/ Plan: Pt is a 88 y.o. yo male  HTN, CHF, hyperlipidemia, and ESRD (was recently started dialysis as an OP-  TTS- [facility]).  He came into the [facility] ED today with complaints of SOB.  Multiple recent hospitalization for similar presentation.  # ESRD TTS at [facility]: Apparently dialysis was started about 2 weeks ago, unable to UF due to intradialytic hypotension and A-fib with RVR.  Tolerated dialysis on 2/16.  Plan for dialysis again tomorrow per the patient's schedule  #A-fib with RVR: Cardiology is following.  On diltiazem drip and Eliquis.  May consider cardioversion.  Rate better today.  #Anemia of ESRD: Monitor hemoglobin.  Iron replete. Hgb at goal at 10.5. No ESA  #CKD-MBD: Phosphorus level acceptable, continue binders/sevelamer.  Monitor lab.  # HTN/volume: BP low, increase midodrine to 10 mg.  May need to lower dialysate temperature for intradialytic hypotension.  UF as tolerated.  #Disposition: Patient with new ESRD with recurrent multiple admission.  Not really tolerating dialysis because of hypotension and A-fib with RVR.  Noted he is DNR/DNI.  Recommend palliative care consult to discuss ongoing goals of care.  Subjective: Patient feels well with no complaints.  Lying flat.  Tolerated dialysis yesterday with 2 L removed.  Objective Vital signs in last 24 hours: Vitals:   01/18/22 0010 01/18/22 0030 01/18/22 0402 01/18/22 0700  BP:  (!) 117/54 (!) 95/56 112/60  Pulse: 80 79 78 97  Resp: 15 18 20 19   Temp:  (!) 97.5 F (36.4 C) 97.7 F (36.5 C) 97.8 F (36.6 C)  TempSrc:  Oral Oral Oral  SpO2: 97% 94% 98% 92%  Weight:  76 kg    Height:       Weight change: 2.223 kg  Intake/Output Summary (Last 24 hours) at 01/18/2022 0927 Last data filed at 01/18/2022 0030 Gross per 24 hour  Intake 365 ml  Output 2020 ml  Net -1655 ml       Labs: Basic Metabolic Panel: Recent Labs  Lab  01/16/22 2224 01/17/22 0711 01/18/22 0056  NA 137 135 136  K 3.1* 3.2* 3.7  CL 97* 96* 98  CO2 25 25 26   GLUCOSE 100* 100* 107*  BUN 33* 36* 13  CREATININE 4.90* 4.99* 2.51*  CALCIUM 8.4* 8.4* 8.3*  PHOS 3.5  --   --    Liver Function Tests: Recent Labs  Lab 01/13/22 1402 01/16/22 2224 01/18/22 0056  AST 33  --  43*  ALT 27  --  30  ALKPHOS 88  --  76  BILITOT 0.4  --  0.9  PROT 7.5  --  7.1  ALBUMIN 2.9* 2.5* 2.6*   No results for input(s): LIPASE, AMYLASE in the last 168 hours. No results for input(s): AMMONIA in the last 168 hours. CBC: Recent Labs  Lab 01/13/22 1402 01/16/22 1455 01/16/22 2224 01/18/22 0056  WBC 9.5 8.2 7.2 8.6  NEUTROABS 8.2* 6.9  --   --   HGB 10.1* 10.2* 9.5* 10.5*  HCT 32.4* 33.2* 31.1* 32.6*  MCV 102.2* 103.8* 102.3* 97.9  PLT 213 178 141* 180   Cardiac Enzymes: No results for input(s): CKTOTAL, CKMB, CKMBINDEX, TROPONINI in the last 168 hours. CBG: No results for input(s): GLUCAP in the last 168 hours.  Iron Studies:  Recent Labs    01/18/22 0056  IRON 55  TIBC 196*  FERRITIN 1,472*   Studies/Results: DG Chest Portable 1 View  Result Date: 01/16/2022  CLINICAL DATA:  Shortness of breath EXAM: PORTABLE CHEST 1 VIEW COMPARISON:  Previous studies including the examination of 01/13/2022 FINDINGS: Transverse diameter of heart is increased. There are no signs of alveolar pulmonary edema. There is increased density in both lower lung fields, more so on the left side. This may suggest bilateral pleural effusions and underlying infiltrates, more so on the left side. There is increase in amount right pleural effusion. There is no pneumothorax. There is old ununited fracture in the left clavicle. IMPRESSION: Cardiomegaly. There are no signs of alveolar pulmonary edema. There is increased density in both lower lung fields, more so on the left side consistent with bilateral pleural effusions and underlying atelectasis/pneumonia. Electronically  Signed   By: Elmer Picker M.D.   On: 01/16/2022 15:37   ECHOCARDIOGRAM COMPLETE  Result Date: 01/17/2022    ECHOCARDIOGRAM REPORT   Patient Name:   [name] [name] Date of Exam: 01/17/2022 Medical Rec #:  [id number]       Height:       68.0 in Accession #:    [id number]      Weight:       167.5 lb Date of Birth:  09-09-1934      BSA:          1.895 m Patient Age:    21 years        BP:           113/67 mmHg Patient Gender: M               HR:           94 bpm. Exam Location:  Inpatient Procedure: 2D Echo Indications:    CHF  History:        Patient has prior history of Echocardiogram examinations, most                 recent 05/07/2021. CAD; Arrythmias:Atrial Fibrillation.  Sonographer:    Jefferey Pica Referring Phys: [id number] [place]  1. A large circumferential pericardial effusion is present measuring up to 2.7cm at end-diastole. There is mild respiratory variation across the mitral valve. There is no RA or RV collapse. IVC is dilated with RAP 90mmHg. Recommend clinical correlation.  2. Left ventricular ejection fraction, by estimation, is 40 to 45%. The left ventricle has mildly decreased function. The left ventricle demonstrates global hypokinesis. There is mild concentric left ventricular hypertrophy. Diastolic function is indeterminant due to AFib.  3. Right ventricular systolic function is mildly reduced. The right ventricular size is normal. There is moderately elevated pulmonary artery systolic pressure. The estimated right ventricular systolic pressure is 12.4 mmHg.  4. Left atrial size was mild to moderately dilated.  5. Right atrial size was mildly dilated.  6. Large pericardial effusion. The pericardial effusion is circumferential.  7. The mitral valve is normal in structure. Trivial mitral valve regurgitation.  8. Tricuspid valve regurgitation is mild to moderate.  9. The aortic valve is tricuspid. There is moderate calcification of the aortic valve. There is moderate  thickening of the aortic valve. Aortic valve regurgitation is not visualized. Mild aortic valve stenosis. 10. The inferior vena cava is dilated in size with <50% respiratory variability, suggesting right atrial pressure of 15 mmHg. Comparison(s): Compared to prior TTE in 05/2021, there is now a large pericardial effusion and the LVEF appears less vigorous (40-45%; previously 55-60%). FINDINGS  Left Ventricle: Left ventricular ejection fraction, by estimation, is 40 to 45%. The left ventricle has mildly decreased  function. The left ventricle demonstrates global hypokinesis. The left ventricular internal cavity size was normal in size. There is  mild concentric left ventricular hypertrophy. Diastolic function is indeterminant due to AFib. Right Ventricle: The right ventricular size is normal. No increase in right ventricular wall thickness. Right ventricular systolic function is mildly reduced. There is moderately elevated pulmonary artery systolic pressure. The tricuspid regurgitant velocity is 2.92 m/s, and with an assumed right atrial pressure of 15 mmHg, the estimated right ventricular systolic pressure is 96.2 mmHg. Left Atrium: Left atrial size was mild to moderately dilated. Right Atrium: Right atrial size was mildly dilated. Pericardium: Measures up to 2.9cm at end diastole. A large pericardial effusion is present. The pericardial effusion is circumferential. Mitral Valve: The mitral valve is normal in structure. There is mild thickening of the mitral valve leaflet(s). Mild mitral annular calcification. Trivial mitral valve regurgitation. Tricuspid Valve: The tricuspid valve is normal in structure. Tricuspid valve regurgitation is mild to moderate. Aortic Valve: The aortic valve is tricuspid. There is moderate calcification of the aortic valve. There is moderate thickening of the aortic valve. Aortic valve regurgitation is not visualized. Mild aortic stenosis is present. Aortic valve mean gradient measures 8.0  mmHg. Aortic valve peak gradient measures 7.0 mmHg. Aortic valve area, by VTI measures 2.46 cm. Pulmonic Valve: The pulmonic valve was normal in structure. Pulmonic valve regurgitation is trivial. Aorta: The aortic root and ascending aorta are structurally normal, with no evidence of dilitation. Venous: The inferior vena cava is dilated in size with less than 50% respiratory variability, suggesting right atrial pressure of 15 mmHg. IAS/Shunts: The atrial septum is grossly normal. Additional Comments: There is a small pleural effusion in the left lateral region.  LEFT VENTRICLE PLAX 2D LVIDd:         4.50 cm      Diastology LVIDs:         3.60 cm      LV e' medial:    8.55 cm/s LV PW:         1.20 cm      LV E/e' medial:  9.5 LV IVS:        1.20 cm      LV e' lateral:   8.93 cm/s LVOT diam:     2.00 cm      LV E/e' lateral: 9.1 LV SV:         87 LV SV Index:   46 LVOT Area:     3.14 cm  LV Volumes (MOD) LV vol d, MOD A4C: 125.0 ml LV vol s, MOD A4C: 77.7 ml LV SV MOD A4C:     125.0 ml IVC IVC diam: 2.80 cm LEFT ATRIUM             Index        RIGHT ATRIUM           Index LA diam:        3.80 cm 2.00 cm/m   RA Area:     16.60 cm LA Vol (A2C):   63.2 ml 33.34 ml/m  RA Volume:   40.55 ml  21.39 ml/m LA Vol (A4C):   40.3 ml 21.26 ml/m LA Biplane Vol: 51.5 ml 27.17 ml/m  AORTIC VALVE                     PULMONIC VALVE AV Area (Vmax):    3.73 cm      PV Vmax:  0.86 m/s AV Area (Vmean):   2.46 cm      PV Peak grad:  3.0 mmHg AV Area (VTI):     2.46 cm AV Vmax:           132.25 cm/s AV Vmean:          130.000 cm/s AV VTI:            0.355 m AV Peak Grad:      7.0 mmHg AV Mean Grad:      8.0 mmHg LVOT Vmax:         157.00 cm/s LVOT Vmean:        102.000 cm/s LVOT VTI:          0.278 m LVOT/AV VTI ratio: 0.78  AORTA Ao Root diam: 3.50 cm Ao Asc diam:  3.50 cm MITRAL VALVE               TRICUSPID VALVE MV Area (PHT): 4.08 cm    TR Peak grad:   34.1 mmHg MV Decel Time: 186 msec    TR Vmax:        292.00 cm/s MV  E velocity: 81.00 cm/s                            SHUNTS                            Systemic VTI:  0.28 m                            Systemic Diam: 2.00 cm Gwyndolyn Kaufman MD Electronically signed by Gwyndolyn Kaufman MD Signature Date/Time: 01/17/2022/2:03:43 PM    Final     Medications: Infusions:  sodium chloride     sodium chloride     diltiazem (CARDIZEM) infusion 12.5 mg/hr (01/17/22 1600)    Scheduled Medications:  allopurinol  100 mg Oral Daily   apixaban  2.5 mg Oral BID   ARIPiprazole  20 mg Oral Daily   Chlorhexidine Gluconate Cloth  6 each Topical Q0600   [START ON 01/19/2022] midodrine  10 mg Oral Q T,Th,S,Su   multivitamin with minerals  1 tablet Oral Daily   omega-3 acid ethyl esters  1 g Oral Daily   rosuvastatin  10 mg Oral Daily   sertraline  100 mg Oral Daily   sevelamer carbonate  800 mg Oral TID with meals    have reviewed scheduled and prn medications.  Physical Exam: General: Frail elderly male, lying on bed Heart: normal rate Lungs: Bilateral chest rise, no increased work of breathing, lying flat Abdomen:soft, Non-tender, non-distended Extremities: 1+ edema in the bilateral lower extremities Dialysis Access: Left arm AV graft has good thrill and bruit  Reesa Chew 01/18/2022,9:27 AM  LOS: 2 days

## 2022-01-18 NOTE — Plan of Care (Signed)
°  Problem: Activity: Goal: Risk for activity intolerance will decrease Outcome: Progressing   Problem: Education: Goal: Ability to demonstrate management of disease process will improve Outcome: Progressing

## 2022-01-18 NOTE — Progress Notes (Signed)
Mobility Specialist Criteria Algorithm Info.    01/18/22 1100  Therapy Vitals  BP (!) 98/54  Patient Position (if appropriate) Lying  Oxygen Therapy  SpO2 92 %  O2 Device Nasal Cannula  Patient Activity (if Appropriate) Ambulating  Pulse Oximetry Type Continuous  Mobility  Activity Ambulated with assistance in room;Ambulated with assistance to bathroom;Dangled on edge of bed  Range of Motion/Exercises Active;All extremities  Level of Assistance Minimal assist, patient does 75% or more (Mod A sit>stand)  Assistive Device Front wheel walker  Distance Ambulated (ft) 20 ft  Activity Response Tolerated well   Patient received in bed agreeable to participate with encouragement. Required mod A for bed mobility from supine>sit + VC to use bed rails. Stood mod A requiring cues for hand placement. Ambulated short distance in room min A and requested to use bathroom upon returning to bed. Was incontinent of stool and required max A for pericare. Returned to bed without complaint or incident and was left dangling EOB with all needs met, call bell in reach.   01/18/2022 3:33 PM  [place] Esteban Kobashigawa, [place], [place]  KRCVK:[phone] Office: [phone]

## 2022-01-19 ENCOUNTER — Inpatient Hospital Stay (HOSPITAL_COMMUNITY): Payer: MEDICARE

## 2022-01-19 DIAGNOSIS — I3139 Other pericardial effusion (noninflammatory): Secondary | ICD-10-CM

## 2022-01-19 DIAGNOSIS — Z515 Encounter for palliative care: Secondary | ICD-10-CM

## 2022-01-19 DIAGNOSIS — Z7189 Other specified counseling: Secondary | ICD-10-CM

## 2022-01-19 LAB — ECHOCARDIOGRAM LIMITED
AR max vel: 1.09 cm2
AV Area VTI: 1.05 cm2
AV Area mean vel: 1.04 cm2
AV Mean grad: 6 mmHg
AV Peak grad: 11.8 mmHg
Ao pk vel: 1.72 m/s
Calc EF: 50.3 %
Height: 68 in
S' Lateral: 3.8 cm
Single Plane A2C EF: 57.4 %
Single Plane A4C EF: 48.7 %
Weight: 2680.79 oz

## 2022-01-19 LAB — RENAL FUNCTION PANEL
Albumin: 2.4 g/dL — ABNORMAL LOW (ref 3.5–5.0)
Anion gap: 11 (ref 5–15)
BUN: 38 mg/dL — ABNORMAL HIGH (ref 8–23)
CO2: 26 mmol/L (ref 22–32)
Calcium: 8.4 mg/dL — ABNORMAL LOW (ref 8.9–10.3)
Chloride: 94 mmol/L — ABNORMAL LOW (ref 98–111)
Creatinine, Ser: 4.98 mg/dL — ABNORMAL HIGH (ref 0.61–1.24)
GFR, Estimated: 11 mL/min — ABNORMAL LOW (ref >60)
Glucose, Bld: 110 mg/dL — ABNORMAL HIGH (ref 70–99)
Phosphorus: 3.6 mg/dL (ref 2.5–4.6)
Potassium: 4.2 mmol/L (ref 3.5–5.1)
Sodium: 131 mmol/L — ABNORMAL LOW (ref 135–145)

## 2022-01-19 LAB — CBC
HCT: 30.1 % — ABNORMAL LOW (ref 39.0–52.0)
Hemoglobin: 9.7 g/dL — ABNORMAL LOW (ref 13.0–17.0)
MCH: 31.9 pg (ref 26.0–34.0)
MCHC: 32.2 g/dL (ref 30.0–36.0)
MCV: 99 fL (ref 80.0–100.0)
Platelets: 184 10*3/uL (ref 150–400)
RBC: 3.04 MIL/uL — ABNORMAL LOW (ref 4.22–5.81)
RDW: 15.6 % — ABNORMAL HIGH (ref 11.5–15.5)
WBC: 8.2 10*3/uL (ref 4.0–10.5)
nRBC: 0 % (ref 0.0–0.2)

## 2022-01-19 MED ORDER — DILTIAZEM HCL 60 MG PO TABS
60.0000 mg | ORAL_TABLET | Freq: Four times a day (QID) | ORAL | Status: DC
  Administered 2022-01-19 – 2022-01-20 (×3): 60 mg via ORAL
  Filled 2022-01-19 (×3): qty 1

## 2022-01-19 MED ORDER — ALBUMIN HUMAN 25 % IV SOLN
INTRAVENOUS | Status: AC
  Filled 2022-01-19: qty 50

## 2022-01-19 MED ORDER — METOPROLOL TARTRATE 25 MG PO TABS
25.0000 mg | ORAL_TABLET | Freq: Two times a day (BID) | ORAL | Status: DC
  Administered 2022-01-19 – 2022-01-21 (×5): 25 mg via ORAL
  Filled 2022-01-19 (×5): qty 1

## 2022-01-19 MED ORDER — ALBUMIN HUMAN 25 % IV SOLN
12.5000 g | Freq: Once | INTRAVENOUS | Status: AC
  Administered 2022-01-19: 12.5 g via INTRAVENOUS

## 2022-01-19 MED ORDER — AMIODARONE HCL 200 MG PO TABS: 200.0000 mg | ORAL_TABLET | Freq: Two times a day (BID) | ORAL | Status: DC

## 2022-01-19 NOTE — Progress Notes (Signed)
° °  Echocardiogram 2D Echocardiogram has been performed.  [name] [name] 01/19/2022, 3:00 PM

## 2022-01-19 NOTE — Consult Note (Signed)
Palliative Medicine Inpatient Consult Note  Consulting Provider: Cherene Altes, MD  Reason for consult:   [place] Palliative Medicine Consult  Reason for Consult? Goals of care   ESRD newly started on HD - not tolerating HD well due to hypotension and tachycardia - now w/ new Afib proving difficult to control and further complicating situation - essentially not tolerating HD well and declining   HPI:  Per intake H&P --> 88yo w/ a hx of ESRD recently started on HD, anemia of CKD,  CHF, HTN, HLD, GERD, anxiety/depression, and prostate cancer in remission who was admitted at [facility] 2/6-01/09/2022 at which time HD was initiated. He returned to the ED 2/15 with SOB in apparent fluid overload. EDW is ~160#. In the ED he was found to be in Afib w/ HR 150.    Palliative care has been asked to get involved in the setting of hemodialysis intolerance to further address goals of care.  Clinical Assessment/Goals of Care:  *Please note that this is a verbal dictation therefore any spelling or grammatical errors are due to the "[place]" system interpretation.  I have reviewed medical records including EPIC notes, labs and imaging, received report from bedside RN, assessed the patient who is lying in bed preparing to go for a dialysis treatment this evening.    I called patient's son, Caymen Dubray as well as primary caregiver, Rosalio Macadamia to further discuss diagnosis prognosis, GOC, EOL wishes, disposition and options.   I introduced Palliative Medicine as specialized medical care for people living with serious illness. It focuses on providing relief from the symptoms and stress of a serious illness. The goal is to improve quality of life for both the patient and the family.  Medical History Review and Understanding:  I reviewed with Elta Guadeloupe Shadee's past medical history of end-stage renal disease resulting in hemodialysis initiation this has been fairly recent.   Discussed his history of diastolic heart failure, hypertension, prostate cancer, and anxiety depression.  Reviewed with Elta Guadeloupe that since Messiyah started dialysis he has been having a fairly difficult time acclimating to the difficulty of the treatments.  Elta Guadeloupe, his brother, and patient's caregivers are all wondering if dialysis is the best solution for patient.  Willette Cluster shares with me that if he does not have dialysis he does understand his time on earth will be short though she does feel there is a degree of suffering he is enduring during and after treatments.  Social History:  Abran is from [facility].  His wife passed away about 14 years ago therefore he is a widower.  He has 2 sons Elta Guadeloupe and Rodman Key.  Elta Guadeloupe lives outside of [place] and Rodman Key lives in [place].  He has 1 grandson Product/process development scientist.  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  He used to love playing golf though his son shares that he does not have any real joy in life any longer.  He is a man of faith and practices within the [facility] [employer] denomination.  Functional and Nutritional State:  Functionally Willette Cluster shares that she pursues all B ADLs for Mister and he is able to do very little for himself any longer.  She expresses that his days are spent on the recliner chair.  While in the chair he listens to classical music.  Appetite has been variable in the home as of recently.  Palliative Symptoms:  Patient experiences fatigue and weakness in the setting of dialysis  treatments.  Advance Directives: A detailed discussion was had today regarding advanced directives.  Patient's sons Elta Guadeloupe and Rodman Key are his surrogate decision makers.  Willette Cluster shares that Elta Guadeloupe is much more involved than Rodman Key and he is the person that predominantly is chosen to discuss patient's clinical status and aid in decision making.  Code Status: Concepts specific to code status, artifical feeding and hydration,  continued IV antibiotics and rehospitalization was had.  Thomas is an established DO NOT RESUSCITATE/DO NOT INTUBATE CODE STATUS  The difference between a aggressive medical intervention path  and a palliative comfort care path for this patient at this time was had.  I shared that if dialysis is stopped Arthurs time on earth will be quite short limited to weeks.  Reviewed that is important to consider though if family feels there is true suffering then it does not seem like there is a true choice.  Goals for the Future:  Both Mark and Pace want Gradie to be as comfortable as possible moving into the future.  They expressed that if he no longer wants dialysis treatment that they will support that.  We agreed to have a meeting tomorrow at 1230 for further conversation with Arthurs involvement.  Allowing Tanya to freely state his desires for the future.  Discussed the importance of continued conversation with family and their  medical providers regarding overall plan of care and treatment options, ensuring decisions are within the context of the patients values and GOCs.  Decision Maker: Conor Lata (son) [phone]  SUMMARY OF RECOMMENDATIONS   DNAR/DNI  Plan for family meeting tomorrow at 12:30  Ongoing Palliative support  Code Status/Advance Care Planning: DNAR/DNI  Palliative Prophylaxis:  Aspiration, Bowel Regimen, Delirium Protocol, Frequent Pain Assessment, Oral Care, Palliative Wound Care, and Turn Reposition  Additional Recommendations (Limitations, Scope, Preferences): Treat the treatable  Psycho-social/Spiritual:  Desire for further Chaplaincy support: Declines Additional Recommendations: Education on HD treatments - education on end of life care if these treatments are stopped   Prognosis: Will depend upon additional conversations though if hemodialysis is stopped life expectancy decreases to days to weeks.  Discharge Planning: Discharge plan is uncertain at this  time.  Vitals:   01/19/22 1400 01/19/22 1534  BP: 110/61 101/74  Pulse: 94 92  Resp: 20 16  Temp:  98.2 F (36.8 C)  SpO2: 97% 94%    Intake/Output Summary (Last 24 hours) at 01/19/2022 1715 Last data filed at 01/19/2022 1229 Gross per 24 hour  Intake 1033.49 ml  Output 0 ml  Net 1033.49 ml   Last Weight  Most recent update: 01/18/2022 12:38 AM    Weight  76 kg (167 lb 8.8 oz)            Gen: Frail elderly Caucasian male in no acute distress HEENT: Dry mucous membranes CV: Regular rate and irregular rhythm PULM: On room air, breathing is even and nonlabored ABD: soft/nontender  EXT: No edema  Neuro: Alert and oriented x3   PPS: 40%   This conversation/these recommendations were discussed with patient primary care team, Dr. Thereasa Solo  MDM High  Medical Decision Making:4 #/Complex Problems:4                      Data Reviewed:4                 Management:4 (1-Straightforward, 2-Low, 3-Moderate, 4-High) ______________________________________________________ [place] Team Team Cell Phone: [phone] Please utilize secure chat with additional questions,  if there is no response within 30 minutes please call the above phone number  Palliative Medicine Team providers are available by phone from 7am to 7pm daily and can be reached through the team cell phone.  Should this patient require assistance outside of these hours, please call the patient's attending physician.

## 2022-01-19 NOTE — Progress Notes (Signed)
[facility] NEPHROLOGY PROGRESS NOTE  Assessment/ Plan: Pt is a 88 y.o. yo male  HTN, CHF, hyperlipidemia, and ESRD (was recently started dialysis as an OP-  TTS- [place]).  He came into the [facility] ED today with complaints of SOB.  Multiple recent hospitalization for similar presentation.  # ESRD TTS at [facility]: Apparently dialysis was started about 2 weeks ago, unable to UF due to intradialytic hypotension and A-fib with RVR.  Tolerated dialysis on 2/16.  Plan for dialysis again today per the patient's schedule  #A-fib with RVR: Cardiology is following.  On diltiazem drip and Eliquis.  May consider cardioversion.  Rate improved  #Anemia of ESRD: Monitor hemoglobin.  Iron replete. Hgb at goal at 10.5. No ESA  #CKD-MBD: Phosphorus level acceptable, continue binders/sevelamer.  Monitor labs.  # HTN/volume: BP low, midodrine to 10 mg.  May need to lower dialysate temperature for intradialytic hypotension.  UF as tolerated.  #Disposition: Patient with new ESRD with recurrent multiple admission.  Not really tolerating dialysis because of hypotension and A-fib with RVR.  Noted he is DNR/DNI.  Recommend palliative care consult to discuss ongoing goals of care.  Subjective: Feels well. Denies any complaints.  Objective Vital signs in last 24 hours: Vitals:   01/18/22 2200 01/18/22 2300 01/19/22 0400 01/19/22 0800  BP: (!) 97/53 (!) 94/54 (!) 105/53 117/84  Pulse: 80 79 95 (!) 116  Resp: 20 20 19 17   Temp:  99.1 F (37.3 C) 98.8 F (37.1 C)   TempSrc:  Oral Axillary   SpO2: 93% 93% 94% 94%  Weight:      Height:       Weight change:   Intake/Output Summary (Last 24 hours) at 01/19/2022 0816 Last data filed at 01/19/2022 0455 Gross per 24 hour  Intake 593.49 ml  Output 0 ml  Net 593.49 ml       Labs: Basic Metabolic Panel: Recent Labs  Lab 01/16/22 2224 01/17/22 0711 01/18/22 0056  NA 137 135 136  K 3.1* 3.2* 3.7  CL 97* 96* 98  CO2 25 25 26    GLUCOSE 100* 100* 107*  BUN 33* 36* 13  CREATININE 4.90* 4.99* 2.51*  CALCIUM 8.4* 8.4* 8.3*  PHOS 3.5  --   --    Liver Function Tests: Recent Labs  Lab 01/13/22 1402 01/16/22 2224 01/18/22 0056  AST 33  --  43*  ALT 27  --  30  ALKPHOS 88  --  76  BILITOT 0.4  --  0.9  PROT 7.5  --  7.1  ALBUMIN 2.9* 2.5* 2.6*   No results for input(s): LIPASE, AMYLASE in the last 168 hours. No results for input(s): AMMONIA in the last 168 hours. CBC: Recent Labs  Lab 01/13/22 1402 01/16/22 1455 01/16/22 2224 01/18/22 0056  WBC 9.5 8.2 7.2 8.6  NEUTROABS 8.2* 6.9  --   --   HGB 10.1* 10.2* 9.5* 10.5*  HCT 32.4* 33.2* 31.1* 32.6*  MCV 102.2* 103.8* 102.3* 97.9  PLT 213 178 141* 180   Cardiac Enzymes: No results for input(s): CKTOTAL, CKMB, CKMBINDEX, TROPONINI in the last 168 hours. CBG: No results for input(s): GLUCAP in the last 168 hours.  Iron Studies:  Recent Labs    01/18/22 0056  IRON 55  TIBC 196*  FERRITIN 1,472*   Studies/Results: ECHOCARDIOGRAM COMPLETE  Result Date: 01/17/2022    ECHOCARDIOGRAM REPORT   Patient Name:   [name] [name] Date of Exam: 01/17/2022 Medical Rec #:  [id number]       Height:       68.0 in Accession #:    [id number]      Weight:       167.5 lb Date of Birth:  1934-03-26      BSA:          1.895 m Patient Age:    45 years        BP:           113/67 mmHg Patient Gender: M               HR:           94 bpm. Exam Location:  Inpatient Procedure: 2D Echo Indications:    CHF  History:        Patient has prior history of Echocardiogram examinations, most                 recent 05/07/2021. CAD; Arrythmias:Atrial Fibrillation.  Sonographer:    Jefferey Pica Referring Phys: [id number] [place]  1. A large circumferential pericardial effusion is present measuring up to 2.7cm at end-diastole. There is mild respiratory variation across the mitral valve. There is no RA or RV collapse. IVC is dilated with RAP 44mmHg. Recommend clinical  correlation.  2. Left ventricular ejection fraction, by estimation, is 40 to 45%. The left ventricle has mildly decreased function. The left ventricle demonstrates global hypokinesis. There is mild concentric left ventricular hypertrophy. Diastolic function is indeterminant due to AFib.  3. Right ventricular systolic function is mildly reduced. The right ventricular size is normal. There is moderately elevated pulmonary artery systolic pressure. The estimated right ventricular systolic pressure is 09.3 mmHg.  4. Left atrial size was mild to moderately dilated.  5. Right atrial size was mildly dilated.  6. Large pericardial effusion. The pericardial effusion is circumferential.  7. The mitral valve is normal in structure. Trivial mitral valve regurgitation.  8. Tricuspid valve regurgitation is mild to moderate.  9. The aortic valve is tricuspid. There is moderate calcification of the aortic valve. There is moderate thickening of the aortic valve. Aortic valve regurgitation is not visualized. Mild aortic valve stenosis. 10. The inferior vena cava is dilated in size with <50% respiratory variability, suggesting right atrial pressure of 15 mmHg. Comparison(s): Compared to prior TTE in 05/2021, there is now a large pericardial effusion and the LVEF appears less vigorous (40-45%; previously 55-60%). FINDINGS  Left Ventricle: Left ventricular ejection fraction, by estimation, is 40 to 45%. The left ventricle has mildly decreased function. The left ventricle demonstrates global hypokinesis. The left ventricular internal cavity size was normal in size. There is  mild concentric left ventricular hypertrophy. Diastolic function is indeterminant due to AFib. Right Ventricle: The right ventricular size is normal. No increase in right ventricular wall thickness. Right ventricular systolic function is mildly reduced. There is moderately elevated pulmonary artery systolic pressure. The tricuspid regurgitant velocity is 2.92 m/s,  and with an assumed right atrial pressure of 15 mmHg, the estimated right ventricular systolic pressure is 23.5 mmHg. Left Atrium: Left atrial size was mild to moderately dilated. Right Atrium: Right atrial size was mildly dilated. Pericardium: Measures up to 2.9cm at end diastole. A large pericardial effusion is present. The pericardial effusion is circumferential. Mitral Valve: The mitral valve is normal in structure. There is mild thickening of the mitral valve leaflet(s). Mild mitral annular calcification. Trivial mitral valve regurgitation. Tricuspid Valve: The tricuspid valve is normal in structure. Tricuspid valve regurgitation  is mild to moderate. Aortic Valve: The aortic valve is tricuspid. There is moderate calcification of the aortic valve. There is moderate thickening of the aortic valve. Aortic valve regurgitation is not visualized. Mild aortic stenosis is present. Aortic valve mean gradient measures 8.0 mmHg. Aortic valve peak gradient measures 7.0 mmHg. Aortic valve area, by VTI measures 2.46 cm. Pulmonic Valve: The pulmonic valve was normal in structure. Pulmonic valve regurgitation is trivial. Aorta: The aortic root and ascending aorta are structurally normal, with no evidence of dilitation. Venous: The inferior vena cava is dilated in size with less than 50% respiratory variability, suggesting right atrial pressure of 15 mmHg. IAS/Shunts: The atrial septum is grossly normal. Additional Comments: There is a small pleural effusion in the left lateral region.  LEFT VENTRICLE PLAX 2D LVIDd:         4.50 cm      Diastology LVIDs:         3.60 cm      LV e' medial:    8.55 cm/s LV PW:         1.20 cm      LV E/e' medial:  9.5 LV IVS:        1.20 cm      LV e' lateral:   8.93 cm/s LVOT diam:     2.00 cm      LV E/e' lateral: 9.1 LV SV:         87 LV SV Index:   46 LVOT Area:     3.14 cm  LV Volumes (MOD) LV vol d, MOD A4C: 125.0 ml LV vol s, MOD A4C: 77.7 ml LV SV MOD A4C:     125.0 ml IVC IVC diam:  2.80 cm LEFT ATRIUM             Index        RIGHT ATRIUM           Index LA diam:        3.80 cm 2.00 cm/m   RA Area:     16.60 cm LA Vol (A2C):   63.2 ml 33.34 ml/m  RA Volume:   40.55 ml  21.39 ml/m LA Vol (A4C):   40.3 ml 21.26 ml/m LA Biplane Vol: 51.5 ml 27.17 ml/m  AORTIC VALVE                     PULMONIC VALVE AV Area (Vmax):    3.73 cm      PV Vmax:       0.86 m/s AV Area (Vmean):   2.46 cm      PV Peak grad:  3.0 mmHg AV Area (VTI):     2.46 cm AV Vmax:           132.25 cm/s AV Vmean:          130.000 cm/s AV VTI:            0.355 m AV Peak Grad:      7.0 mmHg AV Mean Grad:      8.0 mmHg LVOT Vmax:         157.00 cm/s LVOT Vmean:        102.000 cm/s LVOT VTI:          0.278 m LVOT/AV VTI ratio: 0.78  AORTA Ao Root diam: 3.50 cm Ao Asc diam:  3.50 cm MITRAL VALVE               TRICUSPID VALVE MV  Area (PHT): 4.08 cm    TR Peak grad:   34.1 mmHg MV Decel Time: 186 msec    TR Vmax:        292.00 cm/s MV E velocity: 81.00 cm/s                            SHUNTS                            Systemic VTI:  0.28 m                            Systemic Diam: 2.00 cm Gwyndolyn Kaufman MD Electronically signed by Gwyndolyn Kaufman MD Signature Date/Time: 01/17/2022/2:03:43 PM    Final     Medications: Infusions:  sodium chloride     sodium chloride     diltiazem (CARDIZEM) infusion 2.5 mg/hr (01/19/22 0110)    Scheduled Medications:  allopurinol  100 mg Oral Daily   apixaban  2.5 mg Oral BID   ARIPiprazole  20 mg Oral Daily   Chlorhexidine Gluconate Cloth  6 each Topical Q0600   metoprolol tartrate  12.5 mg Oral BID   midodrine  10 mg Oral Q T,Th,S,Su   multivitamin with minerals  1 tablet Oral Daily   omega-3 acid ethyl esters  1 g Oral Daily   rosuvastatin  10 mg Oral Daily   sertraline  100 mg Oral Daily   sevelamer carbonate  800 mg Oral TID with meals    have reviewed scheduled and prn medications.  Physical Exam: General: Frail elderly male, lying on bed Heart: normal rate Lungs:  Bilateral chest rise, no increased work of breathing, lying flat Abdomen:soft, Non-tender, non-distended Extremities: 1+ edema in the bilateral lower extremities Dialysis Access: Left arm AV graft has good thrill and bruit  Reesa Chew 01/19/2022,8:16 AM  LOS: 3 days

## 2022-01-19 NOTE — Progress Notes (Signed)
[name] [name]  ZOX:[id number] DOB: 1933/12/10 DOA: 01/16/2022 PCP: [name], MD    Brief Narrative:  [phone] w/ a hx of ESRD recently started on HD, anemia of CKD,  CHF, HTN, HLD, GERD, anxiety/depression, and prostate cancer in remission who was admitted at [facility] 2/6-01/09/2022 at which time HD was initiated. He returned to the ED 2/15 with SOB in apparent fluid overload. EDW is ~160#. In the ED he was found to be in Afib w/ HR 150.    Consultants:  Nephrology Cardiology   Code Status: NO CODE BLUE   Antimicrobials:  none  DVT prophylaxis: Eliquis   Interim Hx: Afebrile.  Some episodes of tachycardia this morning with heart rate 114-120.  Systolics at approximately 100.  Saturation 95% on 2 L.  No new complaints.  Tells me he feels better.  Denies shortness of breath or chest pain.  States his appetite is slowly improving.  Assessment & Plan:  Atrial Fib w/ acute RVR Rate control meds being titrated by Cardiology - continue Eliquis -heart rate proving difficult to consistently control  Large circumferential pericardial effusion Noted on TTE this admission -no evidence of tamponade -plan is for fluid removal via dialysis and monitor -follow-up TTE to be accomplished today  Acute exacerbation Chronic combined systolic and diastolic CHF  Volume management per dialysis -TTE noted EF 40-45% which is worsened from 55-60% June 2022  [place] Weights   01/16/22 1444 01/17/22 2030 01/18/22 0030  Weight: 76 kg 78.2 kg 76 kg    ESRD - volume overload  Hypotension has reportedly been interfering with effective HD tx -dialysis per Nephrology -follow blood pressure trend - possibly complicated by pericardial effusion?  Anemia of chronic kidney disease  No evidence of acute blood loss -follow hemoglobin  Recent Labs  Lab 01/13/22 1402 01/16/22 1455 01/16/22 2224 01/18/22 0056  HGB 10.1* 10.2* 9.5* 10.5*     Macrocytosis J47 and folic acid levels not low  Hypokalemia  Corrected  with dialysis  HLD  Cont rosuvastatin   Hx of Prostate CA  Goals of care Had a frank discussion with the patient today about the complications we are encountering with attempts at ongoing dialysis and the fact that his body does not seem to be tolerating it very well -patient has agreed to speak further with palliative care to investigate his options going forward   Family Communication: Spoke with family member at bedside Disposition: from home - anticipate return to home when medically stable  Objective: Blood pressure (!) 105/53, pulse 95, temperature 98.8 F (37.1 C), temperature source Axillary, resp. rate 19, height 5\' 8"  (1.727 m), weight 76 kg, SpO2 94 %.  Intake/Output Summary (Last 24 hours) at 01/19/2022 0748 Last data filed at 01/19/2022 0455 Gross per 24 hour  Intake 593.49 ml  Output 0 ml  Net 593.49 ml    Filed Weights   01/16/22 1444 01/17/22 2030 01/18/22 0030  Weight: 76 kg 78.2 kg 76 kg    Examination: General: No acute respiratory distress Lungs: Faint bibasilar crackles -no wheezing Cardiovascular: Irregularly irregular with rate 100 Abdomen: Nontender, nondistended, soft, bowel sounds positive, no rebound Extremities: No significant edema B LE  CBC: Recent Labs  Lab 01/13/22 1402 01/16/22 1455 01/16/22 2224 01/18/22 0056  WBC 9.5 8.2 7.2 8.6  NEUTROABS 8.2* 6.9  --   --   HGB 10.1* 10.2* 9.5* 10.5*  HCT 32.4* 33.2* 31.1* 32.6*  MCV 102.2* 103.8* 102.3* 97.9  PLT 213 178 141* 180  Basic Metabolic Panel: Recent Labs  Lab 01/16/22 2224 01/17/22 0711 01/18/22 0056  NA 137 135 136  K 3.1* 3.2* 3.7  CL 97* 96* 98  CO2 25 25 26   GLUCOSE 100* 100* 107*  BUN 33* 36* 13  CREATININE 4.90* 4.99* 2.51*  CALCIUM 8.4* 8.4* 8.3*  MG  --  2.0  --   PHOS 3.5  --   --     GFR: Estimated Creatinine Clearance: 20.8 mL/min (A) (by C-G formula based on SCr of 2.51 mg/dL (H)).  Liver Function Tests: Recent Labs  Lab 01/13/22 1402  01/16/22 2224 01/18/22 0056  AST 33  --  43*  ALT 27  --  30  ALKPHOS 88  --  76  BILITOT 0.4  --  0.9  PROT 7.5  --  7.1  ALBUMIN 2.9* 2.5* 2.6*    HbA1C: Hgb A1c MFr Bld  Date/Time Value Ref Range Status  05/29/2020 03:24 PM 5.5 4.8 - 5.6 % Final    Comment:    (NOTE)         Prediabetes: 5.7 - 6.4         Diabetes: >6.4         Glycemic control for adults with diabetes: <7.0     Scheduled Meds:  allopurinol  100 mg Oral Daily   apixaban  2.5 mg Oral BID   ARIPiprazole  20 mg Oral Daily   Chlorhexidine Gluconate Cloth  6 each Topical Q0600   metoprolol tartrate  12.5 mg Oral BID   midodrine  10 mg Oral Q T,Th,S,Su   multivitamin with minerals  1 tablet Oral Daily   omega-3 acid ethyl esters  1 g Oral Daily   rosuvastatin  10 mg Oral Daily   sertraline  100 mg Oral Daily   sevelamer carbonate  800 mg Oral TID with meals   Continuous Infusions:  sodium chloride     sodium chloride     diltiazem (CARDIZEM) infusion 2.5 mg/hr (01/19/22 0110)     LOS: 3 days   Cherene Altes, MD [facility] Office  [phone] Pager - Text Page per Shea Evans  If 7PM-7AM, please contact night-coverage per Amion 01/19/2022, 7:48 AM

## 2022-01-19 NOTE — Progress Notes (Signed)
Cardiology Progress Note  Patient ID: [name] [name] MRN: [id number] DOB: [birth year] Date of Encounter: 01/19/2022  Primary Cardiologist: None  Subjective   Chief Complaint: Shortness of breath/weakness  HPI: He reports he feels weak.  Short of breath.  A-fib with RVR despite diltiazem drip.  He has not tolerated hemodialysis well at all.  He reports he no longer wants to do it if he does not have to.  I think he needs a good goals of care discussion.  ROS:  All other ROS reviewed and negative. Pertinent positives noted in the HPI.     Inpatient Medications  Scheduled Meds:  allopurinol  100 mg Oral Daily   apixaban  2.5 mg Oral BID   ARIPiprazole  20 mg Oral Daily   Chlorhexidine Gluconate Cloth  6 each Topical Q0600   diltiazem  60 mg Oral Q6H   metoprolol tartrate  25 mg Oral BID   midodrine  10 mg Oral Q T,Th,S,Su   multivitamin with minerals  1 tablet Oral Daily   omega-3 acid ethyl esters  1 g Oral Daily   rosuvastatin  10 mg Oral Daily   sertraline  100 mg Oral Daily   sevelamer carbonate  800 mg Oral TID with meals   Continuous Infusions:  sodium chloride     sodium chloride     PRN Meds: sodium chloride, sodium chloride, acetaminophen **OR** [DISCONTINUED] acetaminophen, ALPRAZolam, alteplase, guaiFENesin-dextromethorphan, heparin, lidocaine (PF), lidocaine-prilocaine, loratadine, pentafluoroprop-tetrafluoroeth   Vital Signs   Vitals:   01/18/22 2200 01/18/22 2300 01/19/22 0400 01/19/22 0800  BP: (!) 97/53 (!) 94/54 (!) 105/53 117/84  Pulse: 80 79 95 (!) 116  Resp: 20 20 19 17   Temp:  99.1 F (37.3 C) 98.8 F (37.1 C)   TempSrc:  Oral Axillary   SpO2: 93% 93% 94% 94%  Weight:      Height:        Intake/Output Summary (Last 24 hours) at 01/19/2022 0832 Last data filed at 01/19/2022 0455 Gross per 24 hour  Intake 593.49 ml  Output 0 ml  Net 593.49 ml   Last 3 Weights 01/18/2022 01/17/2022 01/16/2022  Weight (lbs) 167 lb 8.8 oz 172 lb 6.4 oz 167 lb 8  oz  Weight (kg) 76 kg 78.2 kg 75.978 kg      Telemetry  Overnight telemetry shows A-fib 100-130 bpm, which I personally reviewed.   Physical Exam   Vitals:   01/18/22 2200 01/18/22 2300 01/19/22 0400 01/19/22 0800  BP: (!) 97/53 (!) 94/54 (!) 105/53 117/84  Pulse: 80 79 95 (!) 116  Resp: 20 20 19 17   Temp:  99.1 F (37.3 C) 98.8 F (37.1 C)   TempSrc:  Oral Axillary   SpO2: 93% 93% 94% 94%  Weight:      Height:        Intake/Output Summary (Last 24 hours) at 01/19/2022 0832 Last data filed at 01/19/2022 0455 Gross per 24 hour  Intake 593.49 ml  Output 0 ml  Net 593.49 ml    Last 3 Weights 01/18/2022 01/17/2022 01/16/2022  Weight (lbs) 167 lb 8.8 oz 172 lb 6.4 oz 167 lb 8 oz  Weight (kg) 76 kg 78.2 kg 75.978 kg    Body mass index is 25.48 kg/m.   General: Frail, ill-appearing Head: Atraumatic, normal size  Eyes: PEERLA, EOMI  Neck: Supple, no JVD Endocrine: No thryomegaly Cardiac: Normal S1, S2; irregular rhythm Lungs: Diminished breath sounds bilaterally Abd: Soft, nontender, no hepatomegaly  Ext: No edema,  pulses 2+ Musculoskeletal: No deformities, BUE and BLE strength normal and equal Skin: Warm and dry, no rashes   Neuro: Alert and oriented to person, place, time, and situation, CNII-XII grossly intact, no focal deficits  Psych: Normal mood and affect   Labs  High Sensitivity Troponin:   Recent Labs  Lab 01/07/22 1118 01/07/22 1305 01/16/22 1455 01/16/22 1644  TROPONINIHS 8 9 21* 19*     Cardiac EnzymesNo results for input(s): TROPONINI in the last 168 hours. No results for input(s): TROPIPOC in the last 168 hours.  Chemistry Recent Labs  Lab 01/13/22 1402 01/16/22 1455 01/16/22 2224 01/17/22 0711 01/18/22 0056  NA 136   < > 137 135 136  K 3.4*   < > 3.1* 3.2* 3.7  CL 98   < > 97* 96* 98  CO2 30   < > 25 25 26   GLUCOSE 135*   < > 100* 100* 107*  BUN 35*   < > 33* 36* 13  CREATININE 4.60*   < > 4.90* 4.99* 2.51*  CALCIUM 8.3*   < > 8.4* 8.4*  8.3*  PROT 7.5  --   --   --  7.1  ALBUMIN 2.9*  --  2.5*  --  2.6*  AST 33  --   --   --  43*  ALT 27  --   --   --  30  ALKPHOS 88  --   --   --  76  BILITOT 0.4  --   --   --  0.9  GFRNONAA 12*   < > 11* 11* 24*  ANIONGAP 8   < > 15 14 12    < > = values in this interval not displayed.    Hematology Recent Labs  Lab 01/16/22 1455 01/16/22 2224 01/18/22 0056  WBC 8.2 7.2 8.6  RBC 3.20* 3.04* 3.33*   3.28*  HGB 10.2* 9.5* 10.5*  HCT 33.2* 31.1* 32.6*  MCV 103.8* 102.3* 97.9  MCH 31.9 31.3 31.5  MCHC 30.7 30.5 32.2  RDW 15.8* 15.4 15.5  PLT 178 141* 180   BNP Recent Labs  Lab 01/13/22 1402 01/16/22 1455  BNP 1,282.0* 1,314.4*    DDimer No results for input(s): DDIMER in the last 168 hours.   Radiology  ECHOCARDIOGRAM COMPLETE  Result Date: 01/17/2022    ECHOCARDIOGRAM REPORT   Patient Name:   [name] [name] Date of Exam: 01/17/2022 Medical Rec #:  [id number]       Height:       68.0 in Accession #:    [id number]      Weight:       167.5 lb Date of Birth:  February 18, 1934      BSA:          1.895 m Patient Age:    88 years        BP:           113/67 mmHg Patient Gender: M               HR:           94 bpm. Exam Location:  Inpatient Procedure: 2D Echo Indications:    CHF  History:        Patient has prior history of Echocardiogram examinations, most                 recent 05/07/2021. CAD; Arrythmias:Atrial Fibrillation.  Sonographer:    Jefferey Pica Referring Phys: [id number] [place]  1. A large circumferential pericardial effusion is present measuring up to 2.7cm at end-diastole. There is mild respiratory variation across the mitral valve. There is no RA or RV collapse. IVC is dilated with RAP 46mmHg. Recommend clinical correlation.  2. Left ventricular ejection fraction, by estimation, is 40 to 45%. The left ventricle has mildly decreased function. The left ventricle demonstrates global hypokinesis. There is mild concentric left ventricular hypertrophy.  Diastolic function is indeterminant due to AFib.  3. Right ventricular systolic function is mildly reduced. The right ventricular size is normal. There is moderately elevated pulmonary artery systolic pressure. The estimated right ventricular systolic pressure is 25.9 mmHg.  4. Left atrial size was mild to moderately dilated.  5. Right atrial size was mildly dilated.  6. Large pericardial effusion. The pericardial effusion is circumferential.  7. The mitral valve is normal in structure. Trivial mitral valve regurgitation.  8. Tricuspid valve regurgitation is mild to moderate.  9. The aortic valve is tricuspid. There is moderate calcification of the aortic valve. There is moderate thickening of the aortic valve. Aortic valve regurgitation is not visualized. Mild aortic valve stenosis. 10. The inferior vena cava is dilated in size with <50% respiratory variability, suggesting right atrial pressure of 15 mmHg. Comparison(s): Compared to prior TTE in 05/2021, there is now a large pericardial effusion and the LVEF appears less vigorous (40-45%; previously 55-60%). FINDINGS  Left Ventricle: Left ventricular ejection fraction, by estimation, is 40 to 45%. The left ventricle has mildly decreased function. The left ventricle demonstrates global hypokinesis. The left ventricular internal cavity size was normal in size. There is  mild concentric left ventricular hypertrophy. Diastolic function is indeterminant due to AFib. Right Ventricle: The right ventricular size is normal. No increase in right ventricular wall thickness. Right ventricular systolic function is mildly reduced. There is moderately elevated pulmonary artery systolic pressure. The tricuspid regurgitant velocity is 2.92 m/s, and with an assumed right atrial pressure of 15 mmHg, the estimated right ventricular systolic pressure is 56.3 mmHg. Left Atrium: Left atrial size was mild to moderately dilated. Right Atrium: Right atrial size was mildly dilated.  Pericardium: Measures up to 2.9cm at end diastole. A large pericardial effusion is present. The pericardial effusion is circumferential. Mitral Valve: The mitral valve is normal in structure. There is mild thickening of the mitral valve leaflet(s). Mild mitral annular calcification. Trivial mitral valve regurgitation. Tricuspid Valve: The tricuspid valve is normal in structure. Tricuspid valve regurgitation is mild to moderate. Aortic Valve: The aortic valve is tricuspid. There is moderate calcification of the aortic valve. There is moderate thickening of the aortic valve. Aortic valve regurgitation is not visualized. Mild aortic stenosis is present. Aortic valve mean gradient measures 8.0 mmHg. Aortic valve peak gradient measures 7.0 mmHg. Aortic valve area, by VTI measures 2.46 cm. Pulmonic Valve: The pulmonic valve was normal in structure. Pulmonic valve regurgitation is trivial. Aorta: The aortic root and ascending aorta are structurally normal, with no evidence of dilitation. Venous: The inferior vena cava is dilated in size with less than 50% respiratory variability, suggesting right atrial pressure of 15 mmHg. IAS/Shunts: The atrial septum is grossly normal. Additional Comments: There is a small pleural effusion in the left lateral region.  LEFT VENTRICLE PLAX 2D LVIDd:         4.50 cm      Diastology LVIDs:         3.60 cm      LV e' medial:    8.55 cm/s LV PW:  1.20 cm      LV E/e' medial:  9.5 LV IVS:        1.20 cm      LV e' lateral:   8.93 cm/s LVOT diam:     2.00 cm      LV E/e' lateral: 9.1 LV SV:         87 LV SV Index:   46 LVOT Area:     3.14 cm  LV Volumes (MOD) LV vol d, MOD A4C: 125.0 ml LV vol s, MOD A4C: 77.7 ml LV SV MOD A4C:     125.0 ml IVC IVC diam: 2.80 cm LEFT ATRIUM             Index        RIGHT ATRIUM           Index LA diam:        3.80 cm 2.00 cm/m   RA Area:     16.60 cm LA Vol (A2C):   63.2 ml 33.34 ml/m  RA Volume:   40.55 ml  21.39 ml/m LA Vol (A4C):   40.3 ml 21.26  ml/m LA Biplane Vol: 51.5 ml 27.17 ml/m  AORTIC VALVE                     PULMONIC VALVE AV Area (Vmax):    3.73 cm      PV Vmax:       0.86 m/s AV Area (Vmean):   2.46 cm      PV Peak grad:  3.0 mmHg AV Area (VTI):     2.46 cm AV Vmax:           132.25 cm/s AV Vmean:          130.000 cm/s AV VTI:            0.355 m AV Peak Grad:      7.0 mmHg AV Mean Grad:      8.0 mmHg LVOT Vmax:         157.00 cm/s LVOT Vmean:        102.000 cm/s LVOT VTI:          0.278 m LVOT/AV VTI ratio: 0.78  AORTA Ao Root diam: 3.50 cm Ao Asc diam:  3.50 cm MITRAL VALVE               TRICUSPID VALVE MV Area (PHT): 4.08 cm    TR Peak grad:   34.1 mmHg MV Decel Time: 186 msec    TR Vmax:        292.00 cm/s MV E velocity: 81.00 cm/s                            SHUNTS                            Systemic VTI:  0.28 m                            Systemic Diam: 2.00 cm Gwyndolyn Kaufman MD Electronically signed by Gwyndolyn Kaufman MD Signature Date/Time: 01/17/2022/2:03:43 PM    Final     Cardiac Studies  TTE 01/17/2022  1. A large circumferential pericardial effusion is present measuring up  to 2.7cm at end-diastole. There is mild respiratory variation across the  mitral valve. There is no RA or RV  collapse. IVC is dilated with RAP  87mmHg. Recommend clinical correlation.   2. Left ventricular ejection fraction, by estimation, is 40 to 45%. The  left ventricle has mildly decreased function. The left ventricle  demonstrates global hypokinesis. There is mild concentric left ventricular  hypertrophy. Diastolic function is  indeterminant due to AFib.   3. Right ventricular systolic function is mildly reduced. The right  ventricular size is normal. There is moderately elevated pulmonary artery  systolic pressure. The estimated right ventricular systolic pressure is  75.6 mmHg.   4. Left atrial size was mild to moderately dilated.   5. Right atrial size was mildly dilated.   6. Large pericardial effusion. The pericardial effusion  is  circumferential.   7. The mitral valve is normal in structure. Trivial mitral valve  regurgitation.   8. Tricuspid valve regurgitation is mild to moderate.   9. The aortic valve is tricuspid. There is moderate calcification of the  aortic valve. There is moderate thickening of the aortic valve. Aortic  valve regurgitation is not visualized. Mild aortic valve stenosis.  10. The inferior vena cava is dilated in size with <50% respiratory  variability, suggesting right atrial pressure of 15 mmHg.   Patient Profile  [name] [name] is a 88 y.o. male with ESRD on hemodialysis, hypertension, hyperlipidemia, prostate cancer, systolic heart failure who was admitted on 01/16/2022 with volume overload and A-fib with RVR.  Assessment & Plan   #A-fib with RVR #Hypotension #Systolic heart failure, EF 40-45% #Large pericardial effusion -He has not done well since on hemodialysis.  He is weak and fatigued.  He reports he really no longer wants to do dialysis if he does not have to.  He has very limited options for rate control.  This is largely limited by large pericardial effusion and A-fib with RVR. -Given his advanced age and frailty he is really not a good candidate for aggressive cardiovascular care. -I would like to recheck an echocardiogram today to make sure the effusion is not worsening.  This could clearly contributing to hypotension and A-fib with RVR.  Really unclear if he will benefit from aggressive cardiovascular care.  Palliative care consult is recommended. -For now we will stop diltiazem drip.  Add Cardizem 60 mg every 6 hours p.o.  Increase metoprolol to tartrate 25 mg twice daily.  Next option would be amiodarone.  Not a candidate for digoxin given kidney function.  I do not believe TEE/cardioversion is in his benefit. -Continue Eliquis 2.5 mg twice daily.  Hemoglobin stable.  No indication his pericardial effusion is hemorrhagic. -Regarding his congestive heart failure he has  extremely limited options.  Not a candidate for ACE/ARB/Arni/MRA given ESRD.  Again not great options.  Not a good candidate for pacemaker.  Not a good candidate for cardioversion. -Continue midodrine for now as well.  For questions or updates, please contact [place] Please consult [email or web address] for contact info under     Signed, [place] T. Audie Box, MD, [place]  01/19/2022 8:32 AM

## 2022-01-20 ENCOUNTER — Inpatient Hospital Stay (HOSPITAL_COMMUNITY): Payer: MEDICARE

## 2022-01-20 DIAGNOSIS — Z515 Encounter for palliative care: Secondary | ICD-10-CM | POA: Diagnosis not present

## 2022-01-20 DIAGNOSIS — Z7189 Other specified counseling: Secondary | ICD-10-CM | POA: Diagnosis not present

## 2022-01-20 LAB — CBC
HCT: 30.9 % — ABNORMAL LOW (ref 39.0–52.0)
Hemoglobin: 9.5 g/dL — ABNORMAL LOW (ref 13.0–17.0)
MCH: 30.4 pg (ref 26.0–34.0)
MCHC: 30.7 g/dL (ref 30.0–36.0)
MCV: 98.7 fL (ref 80.0–100.0)
Platelets: 174 10*3/uL (ref 150–400)
RBC: 3.13 MIL/uL — ABNORMAL LOW (ref 4.22–5.81)
RDW: 15.8 % — ABNORMAL HIGH (ref 11.5–15.5)
WBC: 7.9 10*3/uL (ref 4.0–10.5)
nRBC: 0 % (ref 0.0–0.2)

## 2022-01-20 LAB — RENAL FUNCTION PANEL
Albumin: 2.6 g/dL — ABNORMAL LOW (ref 3.5–5.0)
Anion gap: 9 (ref 5–15)
BUN: 14 mg/dL (ref 8–23)
CO2: 28 mmol/L (ref 22–32)
Calcium: 8.3 mg/dL — ABNORMAL LOW (ref 8.9–10.3)
Chloride: 97 mmol/L — ABNORMAL LOW (ref 98–111)
Creatinine, Ser: 2.78 mg/dL — ABNORMAL HIGH (ref 0.61–1.24)
GFR, Estimated: 22 mL/min — ABNORMAL LOW (ref >60)
Glucose, Bld: 111 mg/dL — ABNORMAL HIGH (ref 70–99)
Phosphorus: 1.8 mg/dL — ABNORMAL LOW (ref 2.5–4.6)
Potassium: 4.1 mmol/L (ref 3.5–5.1)
Sodium: 134 mmol/L — ABNORMAL LOW (ref 135–145)

## 2022-01-20 MED ORDER — FENTANYL CITRATE PF 50 MCG/ML IJ SOSY
12.5000 ug | PREFILLED_SYRINGE | INTRAMUSCULAR | Status: DC | PRN
  Administered 2022-01-20: 12.5 ug via INTRAVENOUS
  Filled 2022-01-20: qty 1

## 2022-01-20 MED ORDER — ALPRAZOLAM 0.5 MG PO TABS: 0.5000 mg | ORAL_TABLET | Freq: Three times a day (TID) | ORAL | Status: DC | PRN

## 2022-01-20 MED ORDER — ALBUMIN HUMAN 25 % IV SOLN: 12.5000 g | Freq: Once | INTRAVENOUS | Status: DC

## 2022-01-20 MED ORDER — LORAZEPAM 2 MG/ML IJ SOLN: 1.0000 mg | INTRAMUSCULAR | Status: DC | PRN

## 2022-01-20 MED ORDER — GUAIFENESIN ER 600 MG PO TB12
600.0000 mg | ORAL_TABLET | Freq: Two times a day (BID) | ORAL | Status: DC
  Administered 2022-01-20 – 2022-01-21 (×3): 600 mg via ORAL
  Filled 2022-01-20 (×4): qty 1

## 2022-01-20 MED ORDER — HYDROMORPHONE HCL 2 MG PO TABS
1.0000 mg | ORAL_TABLET | ORAL | Status: DC | PRN
  Filled 2022-01-20: qty 1

## 2022-01-20 MED ORDER — DILTIAZEM HCL ER COATED BEADS 240 MG PO CP24
240.0000 mg | ORAL_CAPSULE | Freq: Every day | ORAL | Status: DC
  Administered 2022-01-20 – 2022-01-21 (×2): 240 mg via ORAL
  Filled 2022-01-20 (×2): qty 1

## 2022-01-20 MED ORDER — GLYCOPYRROLATE 0.2 MG/ML IJ SOLN
0.2000 mg | INTRAMUSCULAR | Status: DC | PRN
Start: 2022-01-20 — End: 2022-01-21

## 2022-01-20 NOTE — Progress Notes (Signed)
[facility] PROGRESS NOTE  Assessment/ Plan: Pt is a 88 y.o. yo male  HTN, CHF, hyperlipidemia, and ESRD (was recently started dialysis as an OP-  TTS- [employer]).  He came into the [facility] ED today with complaints of SOB.  Multiple recent hospitalization for similar presentation.  # ESRD TTS at [facility]: Apparently dialysis was started about 2 weeks ago, unable to UF due to intradialytic hypotension and A-fib with RVR.  Tolerated dialysis on 2/16 and 2/18.  Planning to maintain TTS schedule unless goals of care change  #A-fib with RVR: Cardiology is following.  Some consideration of cardioversion.  More tachycardic over the past 24 hours.    #Anemia of ESRD: Iron replete.  Hemoglobin 9.5.  Consider ESA  #CKD-MBD: Phosphorus level acceptable, continue binders/sevelamer.  Monitor labs.  # HTN/volume: BP low, midodrine to 10 mg.   UF as tolerated.  #Disposition: Patient with new ESRD with recurrent multiple admission.  Not really tolerating dialysis because of hypotension and A-fib with RVR.  Noted he is DNR/DNI.  Plan for palliative care meeting with family today  Subjective: States that dialysis was a drag yesterday but no other complaints.  Tolerated dialysis with 3 L of ultrafiltration.  Has had more atrial fibrillation.  Family meeting today  Objective Vital signs in last 24 hours: Vitals:   01/19/22 2158 01/20/22 0025 01/20/22 0100 01/20/22 0300  BP: 120/74 (!) 94/59 (!) 101/52 (!) 113/54  Pulse: 80 85 82 86  Resp: (!) 24 20 (!) 34 17  Temp: 99.1 F (37.3 C) 99.3 F (37.4 C)  99.6 F (37.6 C)  TempSrc: Oral Axillary  Axillary  SpO2: 97% 95% 97% 93%  Weight:      Height:       Weight change:   Intake/Output Summary (Last 24 hours) at 01/20/2022 0829 Last data filed at 01/19/2022 2138 Gross per 24 hour  Intake 200 ml  Output 3088 ml  Net -2888 ml       Labs: Basic Metabolic Panel: Recent Labs  Lab 01/16/22 2224 01/17/22 0711  01/18/22 0056 01/19/22 1549 01/20/22 0050  NA 137   < > 136 131* 134*  K 3.1*   < > 3.7 4.2 4.1  CL 97*   < > 98 94* 97*  CO2 25   < > 26 26 28   GLUCOSE 100*   < > 107* 110* 111*  BUN 33*   < > 13 38* 14  CREATININE 4.90*   < > 2.51* 4.98* 2.78*  CALCIUM 8.4*   < > 8.3* 8.4* 8.3*  PHOS 3.5  --   --  3.6 1.8*   < > = values in this interval not displayed.   Liver Function Tests: Recent Labs  Lab 01/13/22 1402 01/16/22 2224 01/18/22 0056 01/19/22 1549 01/20/22 0050  AST 33  --  43*  --   --   ALT 27  --  30  --   --   ALKPHOS 88  --  76  --   --   BILITOT 0.4  --  0.9  --   --   PROT 7.5  --  7.1  --   --   ALBUMIN 2.9*   < > 2.6* 2.4* 2.6*   < > = values in this interval not displayed.   No results for input(s): LIPASE, AMYLASE in the last 168 hours. No results for input(s): AMMONIA in the last 168 hours. CBC: Recent Labs  Lab 01/13/22  1402 01/16/22 1455 01/16/22 2224 01/18/22 0056 01/19/22 1549 01/20/22 0050  WBC 9.5 8.2 7.2 8.6 8.2 7.9  NEUTROABS 8.2* 6.9  --   --   --   --   HGB 10.1* 10.2* 9.5* 10.5* 9.7* 9.5*  HCT 32.4* 33.2* 31.1* 32.6* 30.1* 30.9*  MCV 102.2* 103.8* 102.3* 97.9 99.0 98.7  PLT 213 178 141* 180 184 174   Cardiac Enzymes: No results for input(s): CKTOTAL, CKMB, CKMBINDEX, TROPONINI in the last 168 hours. CBG: No results for input(s): GLUCAP in the last 168 hours.  Iron Studies:  Recent Labs    01/18/22 0056  IRON 55  TIBC 196*  FERRITIN 1,472*   Studies/Results: ECHOCARDIOGRAM LIMITED  Result Date: 01/19/2022    ECHOCARDIOGRAM LIMITED REPORT   Patient Name:   [name] [name] Date of Exam: 01/19/2022 Medical Rec #:  [id number]       Height:       68.0 in Accession #:    [id number]      Weight:       167.5 lb Date of Birth:  1934/04/23      BSA:          1.896 m Patient Age:    45 years        BP:           112/57 mmHg Patient Gender: M               HR:           94 bpm. Exam Location:  Inpatient Procedure: 2D Echo, Limited Echo,  Cardiac Doppler and Color Doppler Indications:    I31.3 PERICARDIAL EFFUSION  History:        Patient has prior history of Echocardiogram examinations, most                 recent 05/07/2021. Risk Factors:Hypertension and Dyslipidemia.                 HEART FAILURE.  Sonographer:    Beryle Beams Referring Phys: [id number] [place]  1. Stable to mildly decreased large circumferential pericardial effusion. There is no evidence of tamponade physiology.  2. The inferior vena cava is normal in size with <50% respiratory variability, suggesting right atrial pressure of 8 mmHg.  3. Limited echo to evaluate pericardial effusion FINDINGS  Pericardium: Stable to mildly decreased large circumferential pericardial effusion. There is no evidence of tamponade physiology. Aortic Valve: Aortic valve mean gradient measures 6.0 mmHg. Aortic valve peak gradient measures 11.8 mmHg. Aortic valve area, by VTI measures 1.05 cm. Venous: The inferior vena cava is normal in size with less than 50% respiratory variability, suggesting right atrial pressure of 8 mmHg. LEFT VENTRICLE PLAX 2D LVIDd:         4.00 cm LVIDs:         3.80 cm LV PW:         0.70 cm LV IVS:        1.30 cm LVOT diam:     2.00 cm LV SV:         32 LV SV Index:   17 LVOT Area:     3.14 cm  LV Volumes (MOD) LV vol d, MOD A2C: 89.5 ml LV vol d, MOD A4C: 117.0 ml LV vol s, MOD A2C: 38.1 ml LV vol s, MOD A4C: 60.0 ml LV SV MOD A2C:     51.4 ml LV SV MOD A4C:     117.0 ml LV  SV MOD BP:      54.4 ml RIGHT VENTRICLE            IVC RV S prime:     6.53 cm/s  IVC diam: 1.80 cm TAPSE (M-mode): 0.6 cm LEFT ATRIUM           Index        RIGHT ATRIUM           Index LA diam:      3.50 cm 1.85 cm/m   RA Area:     19.10 cm LA Vol (A2C): 83.2 ml 43.89 ml/m  RA Volume:   51.20 ml  27.01 ml/m  AORTIC VALVE                     PULMONIC VALVE AV Area (Vmax):    1.09 cm      PV Vmax:       0.47 m/s AV Area (Vmean):   1.04 cm      PV Vmean:      30.100 cm/s AV Area  (VTI):     1.05 cm      PV VTI:        0.065 m AV Vmax:           172.00 cm/s   PV Peak grad:  0.9 mmHg AV Vmean:          114.000 cm/s  PV Mean grad:  0.0 mmHg AV VTI:            0.303 m AV Peak Grad:      11.8 mmHg AV Mean Grad:      6.0 mmHg LVOT Vmax:         59.80 cm/s LVOT Vmean:        37.900 cm/s LVOT VTI:          0.101 m LVOT/AV VTI ratio: 0.33  AORTA Ao Root diam: 2.90 cm Ao Asc diam:  3.30 cm TRICUSPID VALVE TV Peak grad:   21.4 mmHg TV Mean grad:   16.0 mmHg TV Vmax:        2.32 m/s TV Vmean:       195.0 cm/s TV VTI:         0.68 msec  SHUNTS Systemic VTI:  0.10 m Systemic Diam: 2.00 cm Carlyle Dolly MD Electronically signed by Carlyle Dolly MD Signature Date/Time: 01/19/2022/6:12:23 PM    Final     Medications: Infusions:    Scheduled Medications:  allopurinol  100 mg Oral Daily   apixaban  2.5 mg Oral BID   ARIPiprazole  20 mg Oral Daily   Chlorhexidine Gluconate Cloth  6 each Topical Q0600   diltiazem  60 mg Oral Q6H   metoprolol tartrate  25 mg Oral BID   midodrine  10 mg Oral Q T,Th,S,Su   multivitamin with minerals  1 tablet Oral Daily   omega-3 acid ethyl esters  1 g Oral Daily   rosuvastatin  10 mg Oral Daily   sertraline  100 mg Oral Daily   sevelamer carbonate  800 mg Oral TID with meals    have reviewed scheduled and prn medications.  Physical Exam: General: Frail elderly male, lying on bed Heart: normal rate Lungs: Bilateral chest rise, no increased work of breathing, lying flat Abdomen:soft, Non-tender, non-distended Extremities: 1+ edema in the bilateral lower extremities, warm and well perfused Dialysis Access: Left arm AV graft has good thrill and bruit  Reesa Chew 01/20/2022,8:29 AM  LOS:  4 days

## 2022-01-20 NOTE — Progress Notes (Signed)
PT Cancellation Note  Patient Details Name: [name] [name] MRN: [id number] DOB: 24-May-1934   Cancelled Treatment:    Reason Eval/Treat Not Completed: Patient at procedure or test/unavailable  Noted pt having [place] meeting with Palliative care with ?deciding to continue or stop HD. Will await completion of GOC and see if PT eval still indicated.    Arby Barrette, PT Acute Rehabilitation Services  Pager [phone] Office [phone]   Rexanne Mano 01/20/2022, 1:17 PM

## 2022-01-20 NOTE — Progress Notes (Signed)
Cardiology Progress Note  Patient ID: [name] [name] MRN: [id number] DOB: [birth year] Date of Encounter: 01/20/2022  Primary Cardiologist: None  Subjective   Chief Complaint: Weakness/Fatigue  HPI: Reports he feels weak and fatigued.  Off diltiazem drip.  Rates are well controlled.  Pericardial effusion still there but no evidence of tamponade.  ROS:  All other ROS reviewed and negative. Pertinent positives noted in the HPI.     Inpatient Medications  Scheduled Meds:  allopurinol  100 mg Oral Daily   apixaban  2.5 mg Oral BID   ARIPiprazole  20 mg Oral Daily   Chlorhexidine Gluconate Cloth  6 each Topical Q0600   diltiazem  60 mg Oral Q6H   metoprolol tartrate  25 mg Oral BID   midodrine  10 mg Oral Q T,Th,S,Su   multivitamin with minerals  1 tablet Oral Daily   omega-3 acid ethyl esters  1 g Oral Daily   rosuvastatin  10 mg Oral Daily   sertraline  100 mg Oral Daily   sevelamer carbonate  800 mg Oral TID with meals   Continuous Infusions:  PRN Meds: acetaminophen **OR** [DISCONTINUED] acetaminophen, ALPRAZolam, guaiFENesin-dextromethorphan, loratadine   Vital Signs   Vitals:   01/19/22 2158 01/20/22 0025 01/20/22 0100 01/20/22 0300  BP: 120/74 (!) 94/59 (!) 101/52 (!) 113/54  Pulse: 80 85 82 86  Resp: (!) 24 20 (!) 34 17  Temp: 99.1 F (37.3 C) 99.3 F (37.4 C)  99.6 F (37.6 C)  TempSrc: Oral Axillary  Axillary  SpO2: 97% 95% 97% 93%  Weight:      Height:        Intake/Output Summary (Last 24 hours) at 01/20/2022 0838 Last data filed at 01/19/2022 2138 Gross per 24 hour  Intake 200 ml  Output 3088 ml  Net -2888 ml   Last 3 Weights 01/19/2022 01/19/2022 01/18/2022  Weight (lbs) (No Data) 167 lb 15.9 oz 167 lb 8.8 oz  Weight (kg) (No Data) 76.2 kg 76 kg      Telemetry  Overnight telemetry shows A-fib 90 to 100 bpm, which I personally reviewed.   Physical Exam   Vitals:   01/19/22 2158 01/20/22 0025 01/20/22 0100 01/20/22 0300  BP: 120/74 (!) 94/59  (!) 101/52 (!) 113/54  Pulse: 80 85 82 86  Resp: (!) 24 20 (!) 34 17  Temp: 99.1 F (37.3 C) 99.3 F (37.4 C)  99.6 F (37.6 C)  TempSrc: Oral Axillary  Axillary  SpO2: 97% 95% 97% 93%  Weight:      Height:        Intake/Output Summary (Last 24 hours) at 01/20/2022 0838 Last data filed at 01/19/2022 2138 Gross per 24 hour  Intake 200 ml  Output 3088 ml  Net -2888 ml    Last 3 Weights 01/19/2022 01/19/2022 01/18/2022  Weight (lbs) (No Data) 167 lb 15.9 oz 167 lb 8.8 oz  Weight (kg) (No Data) 76.2 kg 76 kg    Body mass index is 25.54 kg/m.   General: Well nourished, well developed, in no acute distress Head: Atraumatic, normal size  Eyes: PEERLA, EOMI  Neck: Supple, no JVD Endocrine: No thryomegaly Cardiac: Normal S1, S2; irregular rhythm, no murmurs Lungs: Diminished breath sounds bilaterally Abd: Soft, nontender, no hepatomegaly  Ext: No edema, pulses 2+ Musculoskeletal: No deformities, BUE and BLE strength normal and equal Skin: Warm and dry, no rashes   Neuro: Alert and oriented to person, place, time, and situation, CNII-XII grossly intact, no focal deficits  Psych: Normal mood and affect   Labs  High Sensitivity Troponin:   Recent Labs  Lab 01/07/22 1118 01/07/22 1305 01/16/22 1455 01/16/22 1644  TROPONINIHS 8 9 21* 19*     Cardiac EnzymesNo results for input(s): TROPONINI in the last 168 hours. No results for input(s): TROPIPOC in the last 168 hours.  Chemistry Recent Labs  Lab 01/13/22 1402 01/16/22 1455 01/18/22 0056 01/19/22 1549 01/20/22 0050  NA 136   < > 136 131* 134*  K 3.4*   < > 3.7 4.2 4.1  CL 98   < > 98 94* 97*  CO2 30   < > 26 26 28   GLUCOSE 135*   < > 107* 110* 111*  BUN 35*   < > 13 38* 14  CREATININE 4.60*   < > 2.51* 4.98* 2.78*  CALCIUM 8.3*   < > 8.3* 8.4* 8.3*  PROT 7.5  --  7.1  --   --   ALBUMIN 2.9*   < > 2.6* 2.4* 2.6*  AST 33  --  43*  --   --   ALT 27  --  30  --   --   ALKPHOS 88  --  76  --   --   BILITOT 0.4  --  0.9   --   --   GFRNONAA 12*   < > 24* 11* 22*  ANIONGAP 8   < > 12 11 9    < > = values in this interval not displayed.    Hematology Recent Labs  Lab 01/18/22 0056 01/19/22 1549 01/20/22 0050  WBC 8.6 8.2 7.9  RBC 3.33*   3.28* 3.04* 3.13*  HGB 10.5* 9.7* 9.5*  HCT 32.6* 30.1* 30.9*  MCV 97.9 99.0 98.7  MCH 31.5 31.9 30.4  MCHC 32.2 32.2 30.7  RDW 15.5 15.6* 15.8*  PLT 180 184 174   BNP Recent Labs  Lab 01/13/22 1402 01/16/22 1455  BNP 1,282.0* 1,314.4*    DDimer No results for input(s): DDIMER in the last 168 hours.   Radiology  ECHOCARDIOGRAM LIMITED  Result Date: 01/19/2022    ECHOCARDIOGRAM LIMITED REPORT   Patient Name:   HAZAEL [name] Date of Exam: 01/19/2022 Medical Rec #:  [id number]       Height:       68.0 in Accession #:    [id number]      Weight:       167.5 lb Date of Birth:  04-Nov-1934      BSA:          1.896 m Patient Age:    88 years        BP:           112/57 mmHg Patient Gender: M               HR:           94 bpm. Exam Location:  Inpatient Procedure: 2D Echo, Limited Echo, Cardiac Doppler and Color Doppler Indications:    I31.3 PERICARDIAL EFFUSION  History:        Patient has prior history of Echocardiogram examinations, most                 recent 05/07/2021. Risk Factors:Hypertension and Dyslipidemia.                 HEART FAILURE.  Sonographer:    Beryle Beams Referring Phys: [id number] [place]  1. Stable to mildly decreased large circumferential pericardial  effusion. There is no evidence of tamponade physiology.  2. The inferior vena cava is normal in size with <50% respiratory variability, suggesting right atrial pressure of 8 mmHg.  3. Limited echo to evaluate pericardial effusion FINDINGS  Pericardium: Stable to mildly decreased large circumferential pericardial effusion. There is no evidence of tamponade physiology. Aortic Valve: Aortic valve mean gradient measures 6.0 mmHg. Aortic valve peak gradient measures 11.8 mmHg. Aortic valve  area, by VTI measures 1.05 cm. Venous: The inferior vena cava is normal in size with less than 50% respiratory variability, suggesting right atrial pressure of 8 mmHg. LEFT VENTRICLE PLAX 2D LVIDd:         4.00 cm LVIDs:         3.80 cm LV PW:         0.70 cm LV IVS:        1.30 cm LVOT diam:     2.00 cm LV SV:         32 LV SV Index:   17 LVOT Area:     3.14 cm  LV Volumes (MOD) LV vol d, MOD A2C: 89.5 ml LV vol d, MOD A4C: 117.0 ml LV vol s, MOD A2C: 38.1 ml LV vol s, MOD A4C: 60.0 ml LV SV MOD A2C:     51.4 ml LV SV MOD A4C:     117.0 ml LV SV MOD BP:      54.4 ml RIGHT VENTRICLE            IVC RV S prime:     6.53 cm/s  IVC diam: 1.80 cm TAPSE (M-mode): 0.6 cm LEFT ATRIUM           Index        RIGHT ATRIUM           Index LA diam:      3.50 cm 1.85 cm/m   RA Area:     19.10 cm LA Vol (A2C): 83.2 ml 43.89 ml/m  RA Volume:   51.20 ml  27.01 ml/m  AORTIC VALVE                     PULMONIC VALVE AV Area (Vmax):    1.09 cm      PV Vmax:       0.47 m/s AV Area (Vmean):   1.04 cm      PV Vmean:      30.100 cm/s AV Area (VTI):     1.05 cm      PV VTI:        0.065 m AV Vmax:           172.00 cm/s   PV Peak grad:  0.9 mmHg AV Vmean:          114.000 cm/s  PV Mean grad:  0.0 mmHg AV VTI:            0.303 m AV Peak Grad:      11.8 mmHg AV Mean Grad:      6.0 mmHg LVOT Vmax:         59.80 cm/s LVOT Vmean:        37.900 cm/s LVOT VTI:          0.101 m LVOT/AV VTI ratio: 0.33  AORTA Ao Root diam: 2.90 cm Ao Asc diam:  3.30 cm TRICUSPID VALVE TV Peak grad:   21.4 mmHg TV Mean grad:   16.0 mmHg TV Vmax:        2.32  m/s TV Vmean:       195.0 cm/s TV VTI:         0.68 msec  SHUNTS Systemic VTI:  0.10 m Systemic Diam: 2.00 cm Carlyle Dolly MD Electronically signed by Carlyle Dolly MD Signature Date/Time: 01/19/2022/6:12:23 PM    Final     Cardiac Studies  TTE 01/19/2022  1. Stable to mildly decreased large circumferential pericardial effusion.  There is no evidence of tamponade physiology.   2. The inferior vena  cava is normal in size with <50% respiratory  variability, suggesting right atrial pressure of 8 mmHg.   3. Limited echo to evaluate pericardial effusion   TTE 01/17/2022  1. A large circumferential pericardial effusion is present measuring up  to 2.7cm at end-diastole. There is mild respiratory variation across the  mitral valve. There is no RA or RV collapse. IVC is dilated with RAP  2mmHg. Recommend clinical correlation.   2. Left ventricular ejection fraction, by estimation, is 40 to 45%. The  left ventricle has mildly decreased function. The left ventricle  demonstrates global hypokinesis. There is mild concentric left ventricular  hypertrophy. Diastolic function is  indeterminant due to AFib.   3. Right ventricular systolic function is mildly reduced. The right  ventricular size is normal. There is moderately elevated pulmonary artery  systolic pressure. The estimated right ventricular systolic pressure is  14.4 mmHg.   4. Left atrial size was mild to moderately dilated.   5. Right atrial size was mildly dilated.   6. Large pericardial effusion. The pericardial effusion is  circumferential.   7. The mitral valve is normal in structure. Trivial mitral valve  regurgitation.   8. Tricuspid valve regurgitation is mild to moderate.   9. The aortic valve is tricuspid. There is moderate calcification of the  aortic valve. There is moderate thickening of the aortic valve. Aortic  valve regurgitation is not visualized. Mild aortic valve stenosis.  10. The inferior vena cava is dilated in size with <50% respiratory  variability, suggesting right atrial pressure of 15 mmHg.   Patient Profile  HEZIKIAH RETZLOFF is a 88 y.o. male with ESRD on hemodialysis, hypertension, hyperlipidemia, prostate cancer, systolic heart failure who was admitted on 01/16/2022 with volume overload and A-fib with RVR.  Assessment & Plan   #A-fib with RVR #Hypotension #Systolic heart failure, EF 40-45% -Rates  are much improved.  Transition to extended release diltiazem to 240 mg daily.  On metoprolol tartrate 25 mg twice daily. -On Eliquis 2.5 mg twice daily. -Would allow for lenient rate control.  He is frail with an ejection fraction of 40-45%.  He has not tolerated many medications.  He is not doing well with dialysis.  Really the best option for him is rate control.  Given his poor tolerance of dialysis there is a planned palliative care discussion today.  I think hospice is likely his best option. -As long as his rates are less than 110 while resting this is a success. -Large pericardial effusion is present but reducing in size.  See discussion below. -He is on midodrine to help with dialysis. -Discussed with his son Kalvyn Desa that he has not tolerated dialysis well.  We discussed that A-fib is secondary.  We will continue with rate control.  Likely planning for transition to hospice per discussion with the son.  #Large pericardial effusion -No signs of cardiac tamponade. -Seems to be slightly improved from last week. -We will continue with dialysis sessions.  This will likely  help.  [facility] will sign off.   Medication Recommendations: As above. Other recommendations (labs, testing, etc): None. Follow up as an outpatient: None needed.  Anticipate transition to hospice care.  Please reach out to cardiology if you need further recommendations.  Would recommend a very conservative approach with this elderly gentleman who is not tolerated hemodialysis well at all.  For questions or updates, please contact [place] Please consult [email or web address] for contact info under     Signed, [place] T. Audie Box, MD, [place]  01/20/2022 8:38 AM

## 2022-01-20 NOTE — Plan of Care (Signed)
°  Problem: Activity: Goal: Risk for activity intolerance will decrease Outcome: Progressing   Problem: Nutrition: Goal: Adequate nutrition will be maintained Outcome: Progressing   Problem: Coping: Goal: Level of anxiety will decrease Outcome: Progressing   Problem: Safety: Goal: Ability to remain free from injury will improve Outcome: Progressing   Problem: Clinical Measurements: Goal: Ability to maintain clinical measurements within normal limits will improve Outcome: Not Progressing Goal: Diagnostic test results will improve Outcome: Not Progressing Goal: Cardiovascular complication will be avoided Outcome: Not Progressing   Problem: Pain Managment: Goal: General experience of comfort will improve Outcome: Not Progressing   Problem: Education: Goal: Ability to demonstrate management of disease process will improve Outcome: Not Progressing   Problem: Activity: Goal: Capacity to carry out activities will improve Outcome: Not Progressing

## 2022-01-20 NOTE — Progress Notes (Signed)
Palliative Medicine Inpatient Follow Up Note   HPI: 88yo w/ a hx of ESRD recently started on HD, anemia of CKD,  CHF, HTN, HLD, GERD, anxiety/depression, and prostate cancer in remission who was admitted at [facility] 2/6-01/09/2022 at which time HD was initiated. He returned to the ED 2/15 with SOB in apparent fluid overload. EDW is ~160#. In the ED he was found to be in Afib w/ HR 150.     Palliative care has been asked to get involved in the setting of hemodialysis intolerance to further address goals of care.  Today's Discussion (01/20/2022):  *Please note that this is a verbal dictation therefore any spelling or grammatical errors are due to the "[place] [employer]" system interpretation.  Chart reviewed inclusive of vital signs, progress notes, laboratory results, and diagnostic images.   I met with Pheng and his caregiver Willette Cluster at bedside.  We were able to call Arthurs 2 sons Elta Guadeloupe and Rodman Key over speaker phone.  A comprehensive review of Arthurs acute on chronic medical conditions was held inclusive of his history of end-stage renal disease, hypertension, atrial fibrillation, systolic heart failure, and a large pericardial effusion.  Reviewed that since initiation on dialysis treatments a few weeks ago he has had increased fatigue and overall a decrease in the quality of life he is living.  We reviewed that it appears his body is not tolerating dialysis terribly well as indicated by his A-fib with RVR and incremental hypotensive episodes.  I shared that we are looking towards two paths one of the paths is continuing a treatment which Heraclio has expressed very clearly to nursing staff and ancillary staff that he does not desire to continue.  The other path is stopping aggressive interventions and transitioning to more of a hospice/comfort oriented approach to care.  Patient's sons were able to weigh in in terms of their observations of El and shared very explicitly the desire for him to  make this decision on his own.  After conversation and reviewing the pros and cons of doing dialysis versus not doing dialysis Ezri stated multiple times to his sons and his caregiver Jeani Hawking that that he does not desire to continue hemodialysis treatments.  When asked what that meant he shared that means my time on earth will not be very long.  Reviewed if Levan has limited time left what he would like to do and accomplish.  He shares the importance of his family with whom he would like to spend time with.  His son Elta Guadeloupe expresses that he will be coming up from [place] to spend time with Arnell Sieving.  Created space and opportunity for patient to explore thoughts feelings and fears regarding current medical situation.  Discussed most important to Markail that he be in his home during his end-of-life process.  We reviewed the role of hospice. I described hospice as a service for patients who have a life expectancy of 6 months or less. The goal of hospice is the preservation of dignity and quality at the end phases of life. Under hospice care, the focus changes from curative to symptom relief.   Patient is anxious to get home sooner than later.  Reviewed that I will touch base with the case manager to prioritize discharge soon as possible.  Keandre was already enrolled in outpatient palliative services through [facility] of [facility] and this is a hospice that he and his family would like to select.  Questions and concerns addressed   Palliative Support Provided  Objective Assessment: Vital Signs Vitals:   01/20/22 0725 01/20/22 1143  BP: (!) 109/53 (!) 106/93  Pulse: 96 95  Resp: 19 19  Temp: 98.2 F (36.8 C) 98.5 F (36.9 C)  SpO2: 95% 95%    Intake/Output Summary (Last 24 hours) at 01/20/2022 1514 Last data filed at 01/19/2022 2138 Gross per 24 hour  Intake --  Output 3088 ml  Net -3088 ml   Last Weight  Most recent update: 01/19/2022  5:30 PM    Weight  76.2 kg (167 lb 15.9 oz)             Gen: Frail elderly Caucasian male in no acute distress HEENT: Dry mucous membranes CV: Regular rate and irregular rhythm PULM: On room air, breathing is even and nonlabored ABD: soft/nontender  EXT: No edema  Neuro: Alert and oriented x3   SUMMARY OF RECOMMENDATIONS   DNAR/DNI  Patient wishes to stop HD  Plan to transition home tomorrow morning with [facility]  Ongoing support  Time Spent: 80  MDM -  High  Medical Decision Making:4 #/Complex Problems: 4                     Data Reviewed:    4             Management: 4 (1-Straightforward, 2-Low, 3-Moderate, 4-High) ______________________________________________________________________________________ [place] Team Team Cell Phone: [phone] Please utilize secure chat with additional questions, if there is no response within 30 minutes please call the above phone number  Palliative Medicine Team providers are available by phone from 7am to 7pm daily and can be reached through the team cell phone.  Should this patient require assistance outside of these hours, please call the patient's attending physician.

## 2022-01-20 NOTE — TOC Initial Note (Addendum)
Transition of Care [facility]) - Initial/Assessment Note    Patient Details  Name: KEVANTE LUNT MRN: [id number] Date of Birth: Mar 19, 1934  [facility] CM/SW Contact:    Bartholomew Crews, RN Phone Number: [phone] 01/20/2022, 3:02 PM  Clinical Narrative:                  Notified of patient stopping HD and wanting to transition from palliative care to hospice care with [facility]. DME to be ordered/delivered to home tomorrow, and hospice nurse to admit patient to hospice care tomorrow.  Spoke with patient and caregiver, Willette Cluster, at the bedside. Stated that hospital bed would be needed but is not dependent on patient transitioning home. Requested transition home tomorrow. Patient offered ambulance transport, but at this time he would prefer that his caregiver, Elvin So, pick him up in the car. Voicemail left for patient's son, Elta Guadeloupe, to update on patient. TOC following for transition needs.   Update: Received call back from [place] to update on transition plans. Elta Guadeloupe is in agreement. Ok to coordinate care with caregivers Willette Cluster and Carlena Bjornstad will be his caregiver on Monday.   Expected Discharge Plan: Home w Hospice Care Barriers to Discharge: Continued Medical Work up   Patient Goals and CMS Choice   CMS Medicare.gov Compare Post Acute Care list provided to:: Patient Choice offered to / list presented to : Patient  Expected Discharge Plan and Services Expected Discharge Plan: Home w Hospice Care In-house Referral: Hospice / Palliative Care Discharge Planning Services: CM Consult Post Acute Care Choice: Hospice Living arrangements for the past 2 months: Single Family Home                                      Prior Living Arrangements/Services Living arrangements for the past 2 months: Single Family Home                Current home services: DME, Homehealth aide    Activities of Daily Living       Permission Sought/Granted                  Emotional Assessment              Admission diagnosis:  ESRD on dialysis ([place]) [N18.6, Z99.2] Atrial flutter with rapid ventricular response ([place]) [I48.92] Atrial flutter, unspecified type ([place]) [I48.92] Acute on chronic congestive heart failure, unspecified heart failure type ([place]) [I50.9] Patient Active Problem List   Diagnosis Date Noted   Pericardial effusion    Atrial fibrillation with rapid ventricular response ([place]) 01/16/2022   Volume overload 01/16/2022   Elevated troponin 01/16/2022   Lactic acidosis 01/16/2022   Chronic anemia 01/16/2022   Pressure injury of skin 01/08/2022   SOB (shortness of breath) 01/07/2022   ESRD on dialysis ([place]) 30/06/6225   Chronic systolic CHF (congestive heart failure) ([place]) 01/07/2022   Acute cystitis with hematuria    Hyponatremia 05/20/2021   Acute on chronic congestive heart failure ([place]) 05/20/2021   Urinary retention    Fracture of third lumbar vertebra ([place]) 02/05/2021   Closed nondisplaced fracture of shaft of fifth metacarpal bone of right hand 01/31/2021   Dislocation of proximal interphalangeal joint of finger 01/31/2021   Anemia in chronic kidney disease 10/21/2019   Syncope 09/07/2018   Acute lower UTI 09/07/2018   Gross hematuria 07/25/2018   Paroxysmal A-fib (  [place]) 07/19/2018   Chronic kidney disease, stage IV (severe) ([place]) 07/19/2018   Hyperlipidemia 08/05/2014   Knee effusion, right 04/21/2012   Male erectile dysfunction 12/17/2011   Essential hypertension 07/25/2011   Personal history of prostate cancer 08/27/2010   CAD (coronary artery disease) 06/14/2010   Carotid artery occlusion 06/14/2010   CLOSED FRACTURE OF ACROMIAL END OF CLAVICLE 04/25/2010   PCP:  [name], MD Pharmacy:   [facility] [place] [address] [place] [address] Phone: [phone] Fax: [phone]     Social Determinants of Health (SDOH)  Interventions    Readmission Risk Interventions Readmission Risk Prevention Plan 01/08/2022 06/06/2020  Transportation Screening Complete Complete  PCP or Specialist Appt within 3-5 Days - Complete  HRI or [place] - Complete  Social Work Consult for [place] Planning/Counseling - Complete  Palliative Care Screening - Not Applicable  Medication Review Press photographer) Complete Complete  HRI or Home Care Consult Complete -  SW Recovery Care/Counseling Consult Complete -  Palliative Care Screening Not Applicable -  [place] Not Applicable -  Some recent data might be hidden

## 2022-01-20 NOTE — Progress Notes (Signed)
[name] [name]  YQI:[id number] DOB: [birth year] DOA: 01/16/2022 PCP: [name], MD    Brief Narrative:  [id number] w/ a hx of ESRD recently started on HD, anemia of CKD,  CHF, HTN, HLD, GERD, anxiety/depression, and prostate cancer in remission who was admitted at [facility] 2/6-01/09/2022 at which time HD was initiated. He returned to the ED 2/15 with SOB in apparent fluid overload. EDW is ~160#. In the ED he was found to be in Afib w/ HR 150.    Consultants:  Nephrology Cardiology   Code Status: NO CODE BLUE   Antimicrobials:  none  DVT prophylaxis: Eliquis   Interim Hx: Temperature modestly elevated to at Tmax of 99.6.  Heart rate variable 81-134.  Blood pressure variable systolic 64-332.  Saturations stable on minimal support.  Follow-up TTE 2/18 suggested modest improvement in pericardial effusion. Pt has made the decision to stop HD treatments. He wishes go home and receive home hospice services. Arrangements are being made for him to be d/c home 2/20.  Assessment & Plan:  Atrial Fib w/ acute RVR Rate control meds have been titrated by Cardiology - on Eliquis - heart rate proving difficult to consistently control -continue rate controlling meds and anticoagulation for now to maximize quality of life but as patient declines certainly his Eliquis could be discontinued at least  Large circumferential pericardial effusion Noted on TTE this admission -no evidence of tamponade - follow-up TTE notes this is stable or slightly decreased after HD   Acute exacerbation Chronic combined systolic and diastolic CHF  Volume management had been managed per dialysis - TTE this admission noted EF 40-45% which is worsened from 55-60% June 2022 - add meds for sx of SOB that will likely worsen at home w/ stopping of HD   [facility] Weights   01/17/22 2030 01/18/22 0030 01/19/22 1709  Weight: 78.2 kg 76 kg 76.2 kg    ESRD - volume overload  Hypotension has reportedly been interfering with effective HD tx - pt  has now decided to stop further HD   Anemia of chronic kidney disease  No evidence of acute blood loss -hemoglobin stable  Macrocytosis R51 and folic acid levels not low  Hypokalemia  Corrected with dialysis  HLD  Cont rosuvastatin   Hx of Prostate CA  Goals of care As noted above, the pt has chosen to focus on comfort, and desires d/c home w/ hospice services w/ no further HD    Family Communication:  Disposition: from home - D/C home w/ hospice 01/21/22  Objective: Blood pressure (!) 113/54, pulse 86, temperature 99.6 F (37.6 C), temperature source Axillary, resp. rate 17, height 5\' 8"  (1.727 m), weight 76.2 kg, SpO2 93 %.  Intake/Output Summary (Last 24 hours) at 01/20/2022 0743 Last data filed at 01/19/2022 2138 Gross per 24 hour  Intake 440 ml  Output 3088 ml  Net -2648 ml    Filed Weights   01/17/22 2030 01/18/22 0030 01/19/22 1709  Weight: 78.2 kg 76 kg 76.2 kg    Examination: General: No acute respiratory distress Lungs: Faint bibasilar crackles Cardiovascular: Irregularly irregular with rate 100 Abdomen: Nontender, nondistended, soft, bowel sounds positive, no rebound Extremities: No significant edema B LE  CBC: Recent Labs  Lab 01/13/22 1402 01/16/22 1455 01/16/22 2224 01/18/22 0056 01/19/22 1549 01/20/22 0050  WBC 9.5 8.2   < > 8.6 8.2 7.9  NEUTROABS 8.2* 6.9  --   --   --   --   HGB 10.1* 10.2*   < >  10.5* 9.7* 9.5*  HCT 32.4* 33.2*   < > 32.6* 30.1* 30.9*  MCV 102.2* 103.8*   < > 97.9 99.0 98.7  PLT 213 178   < > 180 184 174   < > = values in this interval not displayed.    Basic Metabolic Panel: Recent Labs  Lab 01/16/22 2224 01/17/22 0711 01/18/22 0056 01/19/22 1549 01/20/22 0050  NA 137 135 136 131* 134*  K 3.1* 3.2* 3.7 4.2 4.1  CL 97* 96* 98 94* 97*  CO2 25 25 26 26 28   GLUCOSE 100* 100* 107* 110* 111*  BUN 33* 36* 13 38* 14  CREATININE 4.90* 4.99* 2.51* 4.98* 2.78*  CALCIUM 8.4* 8.4* 8.3* 8.4* 8.3*  MG  --  2.0  --   --    --   PHOS 3.5  --   --  3.6 1.8*    GFR: Estimated Creatinine Clearance: 18.8 mL/min (A) (by C-G formula based on SCr of 2.78 mg/dL (H)).  Liver Function Tests: Recent Labs  Lab 01/13/22 1402 01/16/22 2224 01/18/22 0056 01/19/22 1549 01/20/22 0050  AST 33  --  43*  --   --   ALT 27  --  30  --   --   ALKPHOS 88  --  76  --   --   BILITOT 0.4  --  0.9  --   --   PROT 7.5  --  7.1  --   --   ALBUMIN 2.9* 2.5* 2.6* 2.4* 2.6*    HbA1C: Hgb A1c MFr Bld  Date/Time Value Ref Range Status  05/29/2020 03:24 PM 5.5 4.8 - 5.6 % Final    Comment:    (NOTE)         Prediabetes: 5.7 - 6.4         Diabetes: >6.4         Glycemic control for adults with diabetes: <7.0     Scheduled Meds:  allopurinol  100 mg Oral Daily   apixaban  2.5 mg Oral BID   ARIPiprazole  20 mg Oral Daily   Chlorhexidine Gluconate Cloth  6 each Topical Q0600   diltiazem  60 mg Oral Q6H   metoprolol tartrate  25 mg Oral BID   midodrine  10 mg Oral Q T,Th,S,Su   multivitamin with minerals  1 tablet Oral Daily   omega-3 acid ethyl esters  1 g Oral Daily   rosuvastatin  10 mg Oral Daily   sertraline  100 mg Oral Daily   sevelamer carbonate  800 mg Oral TID with meals     LOS: 4 days   Cherene Altes, MD [facility] Office  [phone] Pager - Text Page per Shea Evans  If 7PM-7AM, please contact night-coverage per Amion 01/20/2022, 7:43 AM

## 2022-01-21 DIAGNOSIS — Z7189 Other specified counseling: Secondary | ICD-10-CM | POA: Diagnosis not present

## 2022-01-21 DIAGNOSIS — Z515 Encounter for palliative care: Secondary | ICD-10-CM | POA: Diagnosis not present

## 2022-01-21 MED ORDER — HALOPERIDOL 5 MG PO TABS: 5.0000 mg | ORAL_TABLET | ORAL | 0 refills | Status: AC | PRN

## 2022-01-21 MED ORDER — ACETAMINOPHEN 325 MG PO TABS: 650.0000 mg | ORAL_TABLET | Freq: Four times a day (QID) | ORAL | Status: AC | PRN

## 2022-01-21 MED ORDER — LORAZEPAM 0.5 MG PO TABS: 0.5000 mg | ORAL_TABLET | ORAL | 0 refills | Status: AC | PRN

## 2022-01-21 MED ORDER — HYOSCYAMINE SULFATE 0.125 MG SL SUBL: 0.1250 mg | SUBLINGUAL_TABLET | SUBLINGUAL | 0 refills | Status: AC | PRN

## 2022-01-21 MED ORDER — SENNOSIDES 8.6 MG PO TABS: 2.0000 | ORAL_TABLET | Freq: Two times a day (BID) | ORAL | 0 refills | Status: AC

## 2022-01-21 MED ORDER — HYDROMORPHONE HCL 2 MG PO TABS: 2.0000 mg | ORAL_TABLET | ORAL | 0 refills | Status: AC | PRN

## 2022-01-21 MED ORDER — GUAIFENESIN ER 600 MG PO TB12: 600.0000 mg | ORAL_TABLET | Freq: Two times a day (BID) | ORAL | 0 refills | Status: AC | PRN

## 2022-01-21 MED ORDER — DILTIAZEM HCL ER COATED BEADS 240 MG PO CP24: 240.0000 mg | ORAL_CAPSULE | Freq: Every day | ORAL | 0 refills | Status: AC

## 2022-01-21 MED ORDER — PROCHLORPERAZINE MALEATE 10 MG PO TABS: 10.0000 mg | ORAL_TABLET | ORAL | 0 refills | Status: AC | PRN

## 2022-01-21 NOTE — Progress Notes (Signed)
Palliative Medicine Inpatient Follow Up Note   HPI: 88yo w/ a hx of ESRD recently started on HD, anemia of CKD,  CHF, HTN, HLD, GERD, anxiety/depression, and prostate cancer in remission who was admitted at [facility] 2/6-01/09/2022 at which time HD was initiated. He returned to the ED 2/15 with SOB in apparent fluid overload. EDW is ~160#. In the ED he was found to be in Afib w/ HR 150.     Palliative care has been asked to get involved in the setting of hemodialysis intolerance to further address goals of care.  Today's Discussion (01/21/2022):  *Please note that this is a verbal dictation therefore any spelling or grammatical errors are due to the "[employer]" system interpretation.  Chart reviewed inclusive of vital signs, progress notes, laboratory results, and diagnostic images.   I went by this morning and met with [name] [name] at bedside. He was resting comfortably and looked to be completing his breakfast. I shared the plan for discharge this morning which he was aware of.  Reviewed the plan to stop HD and focus on symptom management at this time. [name] [name] is understanding of this.   Questions and concerns addressed   Palliative Support Provided  Objective Assessment: Vital Signs Vitals:   01/20/22 2325 01/21/22 0315  BP: (!) 104/53 (!) 101/50  Pulse: 68 65  Resp: 19 19  Temp: 97.6 F (36.4 C) 98.4 F (36.9 C)  SpO2: 97% 95%   No intake or output data in the 24 hours ending 01/21/22 1941  Last Weight  Most recent update: 01/19/2022  5:30 PM    Weight  76.2 kg (167 lb 15.9 oz)            Gen: Frail elderly Caucasian male in no acute distress HEENT: Dry mucous membranes CV: Regular rate and irregular rhythm PULM: On room air, breathing is even and nonlabored ABD: soft/nontender  EXT: No edema  Neuro: Alert and oriented x3   SUMMARY OF RECOMMENDATIONS   DNAR/DNI  Patient wishes to stop HD  Plan to transition home this morning with [facility]  Ongoing support  Time Spent:   MDM -  Moderate ______________________________________________________________________________________ [place] Team Team Cell Phone: [phone] Please utilize secure chat with additional questions, if there is no response within 30 minutes please call the above phone number  Palliative Medicine Team providers are available by phone from 7am to 7pm daily and can be reached through the team cell phone.  Should this patient require assistance outside of these hours, please call the patient's attending physician.

## 2022-01-21 NOTE — Plan of Care (Signed)

## 2022-01-21 NOTE — Discharge Summary (Signed)
DISCHARGE SUMMARY  [name] [name]  MR#: [id number]  DOB:1934-03-28  Date of Admission: 01/16/2022 Date of Discharge: 01/21/2022  Attending Physician:Cornelius Marullo Hennie Duos, MD  Patient's HGD:JMEQ, [name] Areola, MD  Consults: Nephrology Cardiology  Palliative Care    Disposition: D/C home w/ hospice care   Follow-up Appts:  Follow-up Information     [name], MD Follow up.   Specialty: Internal Medicine Why: As needed Contact information: [address] [place]t [place] [address] [phone]                 Discharge Diagnoses: Atrial Fib w/ acute RVR Large circumferential pericardial effusion Acute exacerbation Chronic combined systolic and diastolic CHF  ESRD - volume overload  Anemia of chronic kidney disease  Macrocytosis Hypokalemia  HLD  Hx of Prostate CA Goals of care - NO CODE BLUE Comfort Care   Initial presentation: 88yo w/ a hx of ESRD recently started on HD, anemia of CKD,  CHF, HTN, HLD, GERD, anxiety/depression, and prostate cancer in remission who was admitted at [facility] 2/6-01/09/2022 at which time HD was initiated. He returned to the ED 2/15 with SOB in apparent fluid overload. In the ED he was found to be in Afib w/ HR 150.    Hospital Course:  Atrial Fib w/ acute RVR Rate control meds were titrated by Cardiology - on Eliquis - heart rate proved difficult to consistently control -continue rate controlling meds and anticoagulation for now to maximize quality of life but as patient declines certainly his Eliquis could be discontinued, and if he is not plagued by palpitations so could his rate controlling meds    Large circumferential pericardial effusion Noted on TTE this admission -no evidence of tamponade - follow-up TTE noted this is stable or slightly decreased after HD    Acute exacerbation Chronic combined systolic and diastolic CHF  Volume management had been managed per dialysis - TTE this admission noted EF 40-45% which is worsened from  55-60% June 2022 - added meds for sx of SOB that will likely worsen at home w/ stopping of HDA  ESRD - volume overload  Hypotension has reportedly been interfering with effective HD tx - pt has now decided to stop further HD    Anemia of chronic kidney disease  No evidence of acute blood loss - hemoglobin stable   Macrocytosis Q11 and folic acid levels not low   Hypokalemia  Corrected with dialysis   HLD  Cont rosuvastatin    Hx of Prostate CA   Goals of care Pt has made the decision to stop HD treatments. He wishes go home and receive home hospice services. Arrangements were made for him to be d/c home 2/20. His family and support were all in agreement.     Allergies as of 01/21/2022       Reactions   Ivp Dye [iodinated Contrast Media]    Due to partial renal failure   Nalbuphine Other (See Comments)   PATIENT STATES IT MADE HIM FEEL LIKE HE WAS GOING TO DIE. NO SPECIFICS   Codeine Anxiety        Medication List     STOP taking these medications    albuterol 108 (90 Base) MCG/ACT inhaler Commonly known as: VENTOLIN HFA   allopurinol 100 MG tablet Commonly known as: ZYLOPRIM   ALPRAZolam 0.5 MG tablet Commonly known as: XANAX   fish oil-omega-3 fatty acids 1000 MG capsule   lansoprazole 15 MG capsule Commonly known as: PREVACID  midodrine 5 MG tablet Commonly known as: PROAMATINE   multivitamin with minerals Tabs tablet   ofloxacin 0.3 % OTIC solution Commonly known as: FLOXIN   rosuvastatin 10 MG tablet Commonly known as: CRESTOR   senna-docusate 8.6-50 MG tablet Commonly known as: Senokot-S   sevelamer carbonate 800 MG tablet Commonly known as: RENVELA   sodium bicarbonate 650 MG tablet       TAKE these medications    acetaminophen 325 MG tablet Commonly known as: TYLENOL Take 2 tablets (650 mg total) by mouth every 6 (six) hours as needed for mild pain (or Fever >/= 101). What changed: Another medication with the same name was  removed. Continue taking this medication, and follow the directions you see here.   apixaban 2.5 MG Tabs tablet Commonly known as: ELIQUIS Take 1 tablet (2.5 mg total) by mouth 2 (two) times daily.   ARIPiprazole 20 MG tablet Commonly known as: ABILIFY Take 1 tablet (20 mg total) by mouth daily.   diltiazem 240 MG 24 hr capsule Commonly known as: CARDIZEM CD Take 1 capsule (240 mg total) by mouth daily.   guaiFENesin 600 MG 12 hr tablet Commonly known as: MUCINEX Take 1 tablet (600 mg total) by mouth 2 (two) times daily as needed for cough or to loosen phlegm.   haloperidol 5 MG tablet Commonly known as: HALDOL Take 1 tablet (5 mg total) by mouth every 4 (four) hours as needed for agitation. May crush, mix with water and give sublingually if needed.   HYDROmorphone 2 MG tablet Commonly known as: DILAUDID Take 1 tablet (2 mg total) by mouth every 4 (four) hours as needed for severe pain. May crush, mix with water and give sublingually if needed.   hyoscyamine 0.125 MG SL tablet Commonly known as: LEVSIN SL Place 1 tablet (0.125 mg total) under the tongue every 4 (four) hours as needed (excess oral secretions).   LORazepam 0.5 MG tablet Commonly known as: ATIVAN Take 1 tablet (0.5 mg total) by mouth every 4 (four) hours as needed for anxiety. May crush, mix with water and give sublingually if needed.   metoprolol tartrate 25 MG tablet Commonly known as: LOPRESSOR Take 1 tablet (25 mg total) by mouth 2 (two) times daily.   prochlorperazine 10 MG tablet Commonly known as: COMPAZINE Take 1 tablet (10 mg total) by mouth every 4 (four) hours as needed for nausea or vomiting. May crush, mix with water and give sublingually.   senna 8.6 MG tablet Commonly known as: Senokot Take 2 tablets (17.2 mg total) by mouth 2 (two) times daily. May crush, mix with water and give sublingually if needed.   sertraline 100 MG tablet Commonly known as: ZOLOFT Take 1 tablet (100 mg total) by  mouth daily.               Discharge Care Instructions  (From admission, onward)           Start     Ordered   01/21/22 0000  No dressing needed        01/21/22 0802            Day of Discharge BP (!) 101/50 (BP Location: Right Arm)    Pulse 65    Temp 98.4 F (36.9 C) (Oral)    Resp 19    Ht 5\' 8"  (1.727 m)    Wt 76.2 kg    SpO2 95%    BMI 25.54 kg/m   Physical Exam: General: No acute respiratory  distress Lungs: Clear to auscultation  Cardiovascular: Regular rate Abdomen: Nontender, nondistended, soft, bowel sounds positive Extremities: trace edema bilateral lower extremities  Basic Metabolic Panel: Recent Labs  Lab 01/16/22 2224 01/17/22 0711 01/18/22 0056 01/19/22 1549 01/20/22 0050  NA 137 135 136 131* 134*  K 3.1* 3.2* 3.7 4.2 4.1  CL 97* 96* 98 94* 97*  CO2 25 25 26 26 28   GLUCOSE 100* 100* 107* 110* 111*  BUN 33* 36* 13 38* 14  CREATININE 4.90* 4.99* 2.51* 4.98* 2.78*  CALCIUM 8.4* 8.4* 8.3* 8.4* 8.3*  MG  --  2.0  --   --   --   PHOS 3.5  --   --  3.6 1.8*   CBC: Recent Labs  Lab 01/16/22 1455 01/16/22 2224 01/18/22 0056 01/19/22 1549 01/20/22 0050  WBC 8.2 7.2 8.6 8.2 7.9  NEUTROABS 6.9  --   --   --   --   HGB 10.2* 9.5* 10.5* 9.7* 9.5*  HCT 33.2* 31.1* 32.6* 30.1* 30.9*  MCV 103.8* 102.3* 97.9 99.0 98.7  PLT 178 141* 180 184 174    Time spent in discharge (includes decision making & examination of pt): 35 minutes  01/21/2022, 8:02 AM   Cherene Altes, MD [facility] Office  [phone]

## 2022-01-21 NOTE — Progress Notes (Signed)
Contacted [facility] and spoke to [place]. Clinic advised of pt's choice to stop HD and to admit under hospice care. Clinic requested that d/c summary be faxed to clinic to show pt's plan of care at d/c. D/C summary faxed.   Melven Sartorius Renal Navigator [phone]

## 2022-01-30 DIAGNOSIS — K219 Gastro-esophageal reflux disease without esophagitis: Secondary | ICD-10-CM | POA: Diagnosis not present

## 2022-01-30 DIAGNOSIS — F32A Depression, unspecified: Secondary | ICD-10-CM | POA: Diagnosis not present

## 2022-01-30 DIAGNOSIS — R0602 Shortness of breath: Secondary | ICD-10-CM | POA: Diagnosis not present

## 2022-01-30 DIAGNOSIS — N189 Chronic kidney disease, unspecified: Secondary | ICD-10-CM | POA: Diagnosis not present

## 2022-01-30 DIAGNOSIS — E876 Hypokalemia: Secondary | ICD-10-CM | POA: Diagnosis not present

## 2022-01-30 DIAGNOSIS — E785 Hyperlipidemia, unspecified: Secondary | ICD-10-CM | POA: Diagnosis not present

## 2022-01-30 DIAGNOSIS — R531 Weakness: Secondary | ICD-10-CM | POA: Diagnosis not present

## 2022-01-30 DIAGNOSIS — I5041 Acute combined systolic (congestive) and diastolic (congestive) heart failure: Secondary | ICD-10-CM | POA: Diagnosis not present

## 2022-01-30 DIAGNOSIS — F419 Anxiety disorder, unspecified: Secondary | ICD-10-CM | POA: Diagnosis not present

## 2022-01-30 DIAGNOSIS — I2510 Atherosclerotic heart disease of native coronary artery without angina pectoris: Secondary | ICD-10-CM | POA: Diagnosis not present

## 2022-01-30 DIAGNOSIS — I480 Paroxysmal atrial fibrillation: Secondary | ICD-10-CM | POA: Diagnosis not present

## 2022-01-30 DIAGNOSIS — Z8546 Personal history of malignant neoplasm of prostate: Secondary | ICD-10-CM | POA: Diagnosis not present

## 2022-01-30 DIAGNOSIS — D631 Anemia in chronic kidney disease: Secondary | ICD-10-CM | POA: Diagnosis not present

## 2022-03-02 DEATH — deceased

## 2022-04-09 DIAGNOSIS — Z20822 Contact with and (suspected) exposure to covid-19: Secondary | ICD-10-CM | POA: Diagnosis not present

## 2022-08-30 IMAGING — DX DG CHEST 2V
2 series · 2 of 2 positions shown · non-contrast
Comparison: 12/12/2020

CLINICAL DATA: Cough and shortness of breath for 1 week

EXAM:
CHEST - 2 VIEW

[chest pa]
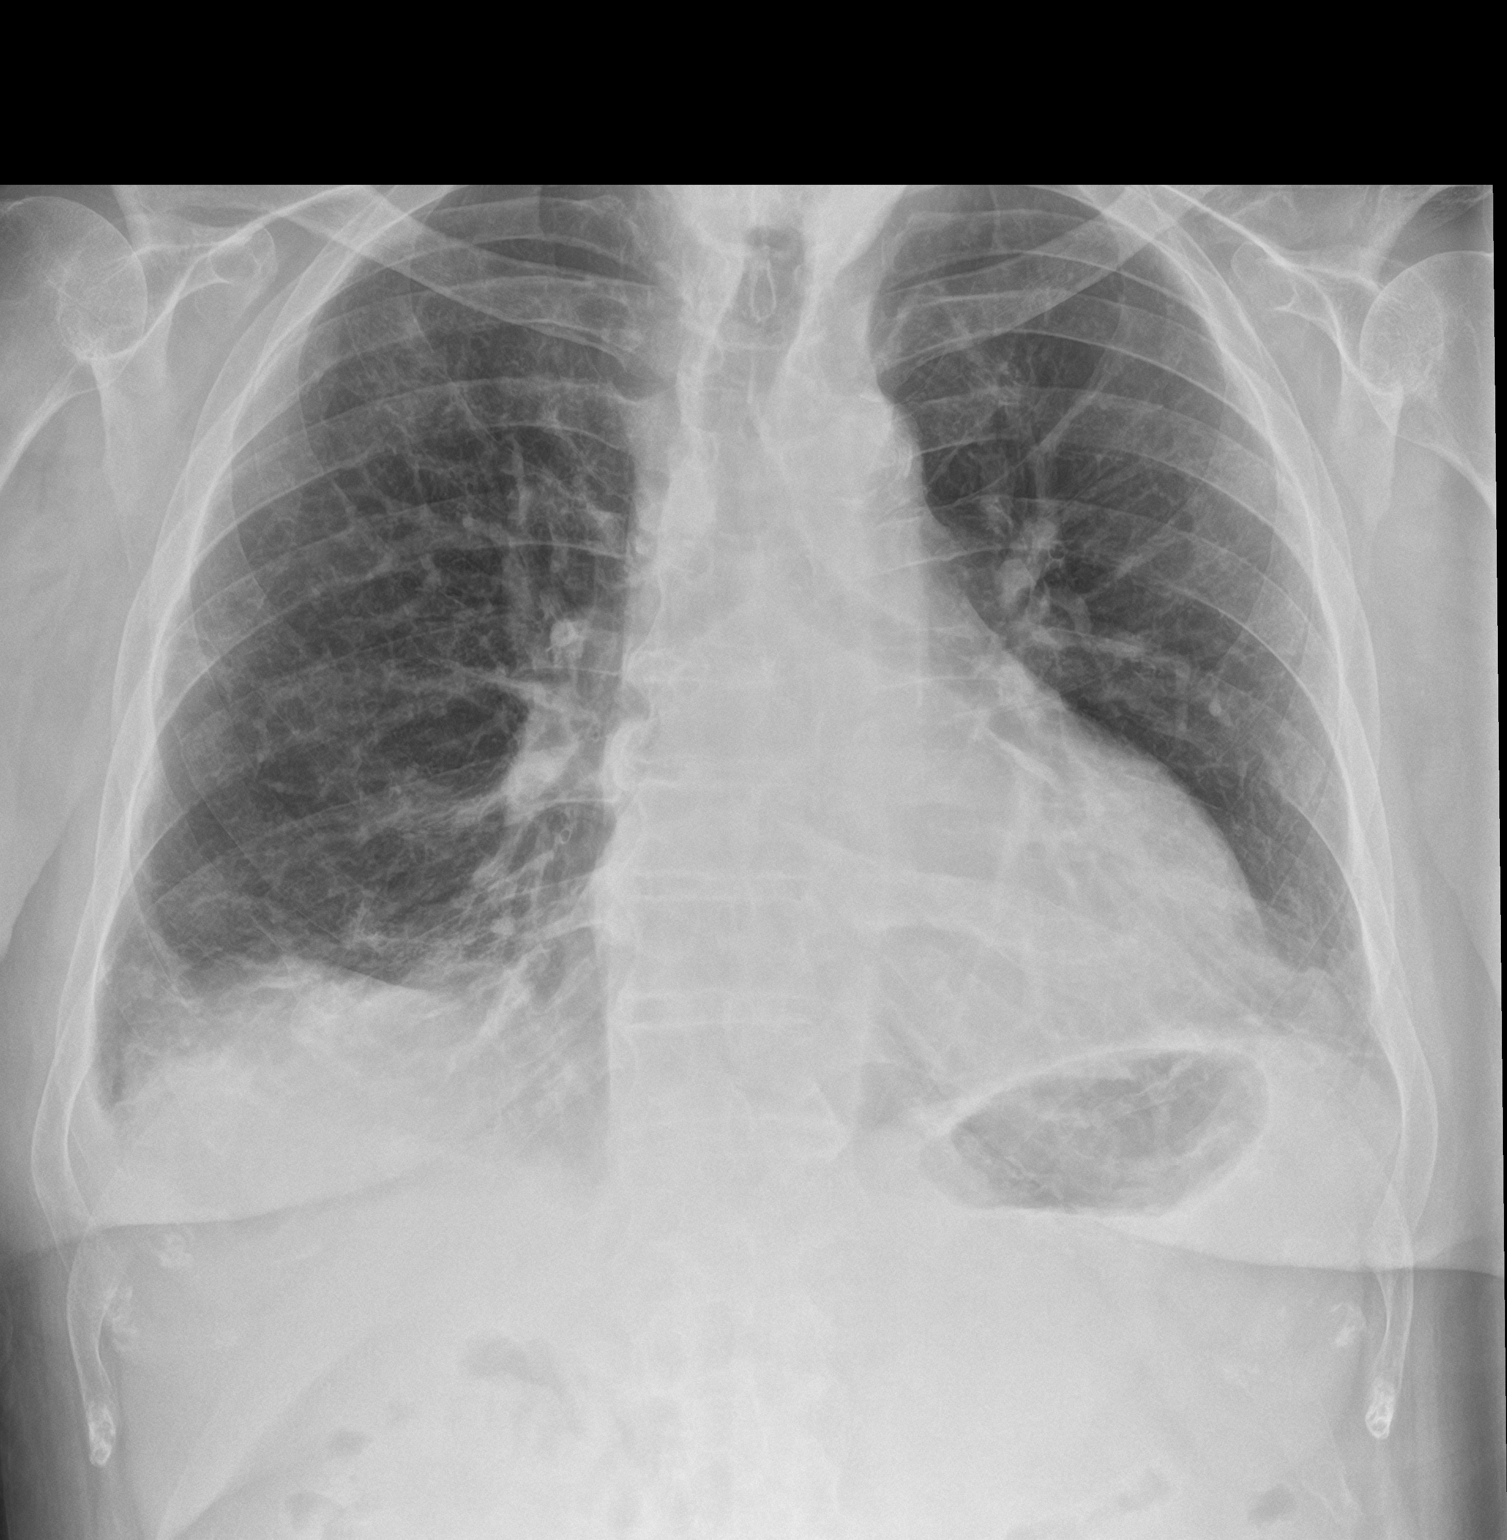

[chest lat]
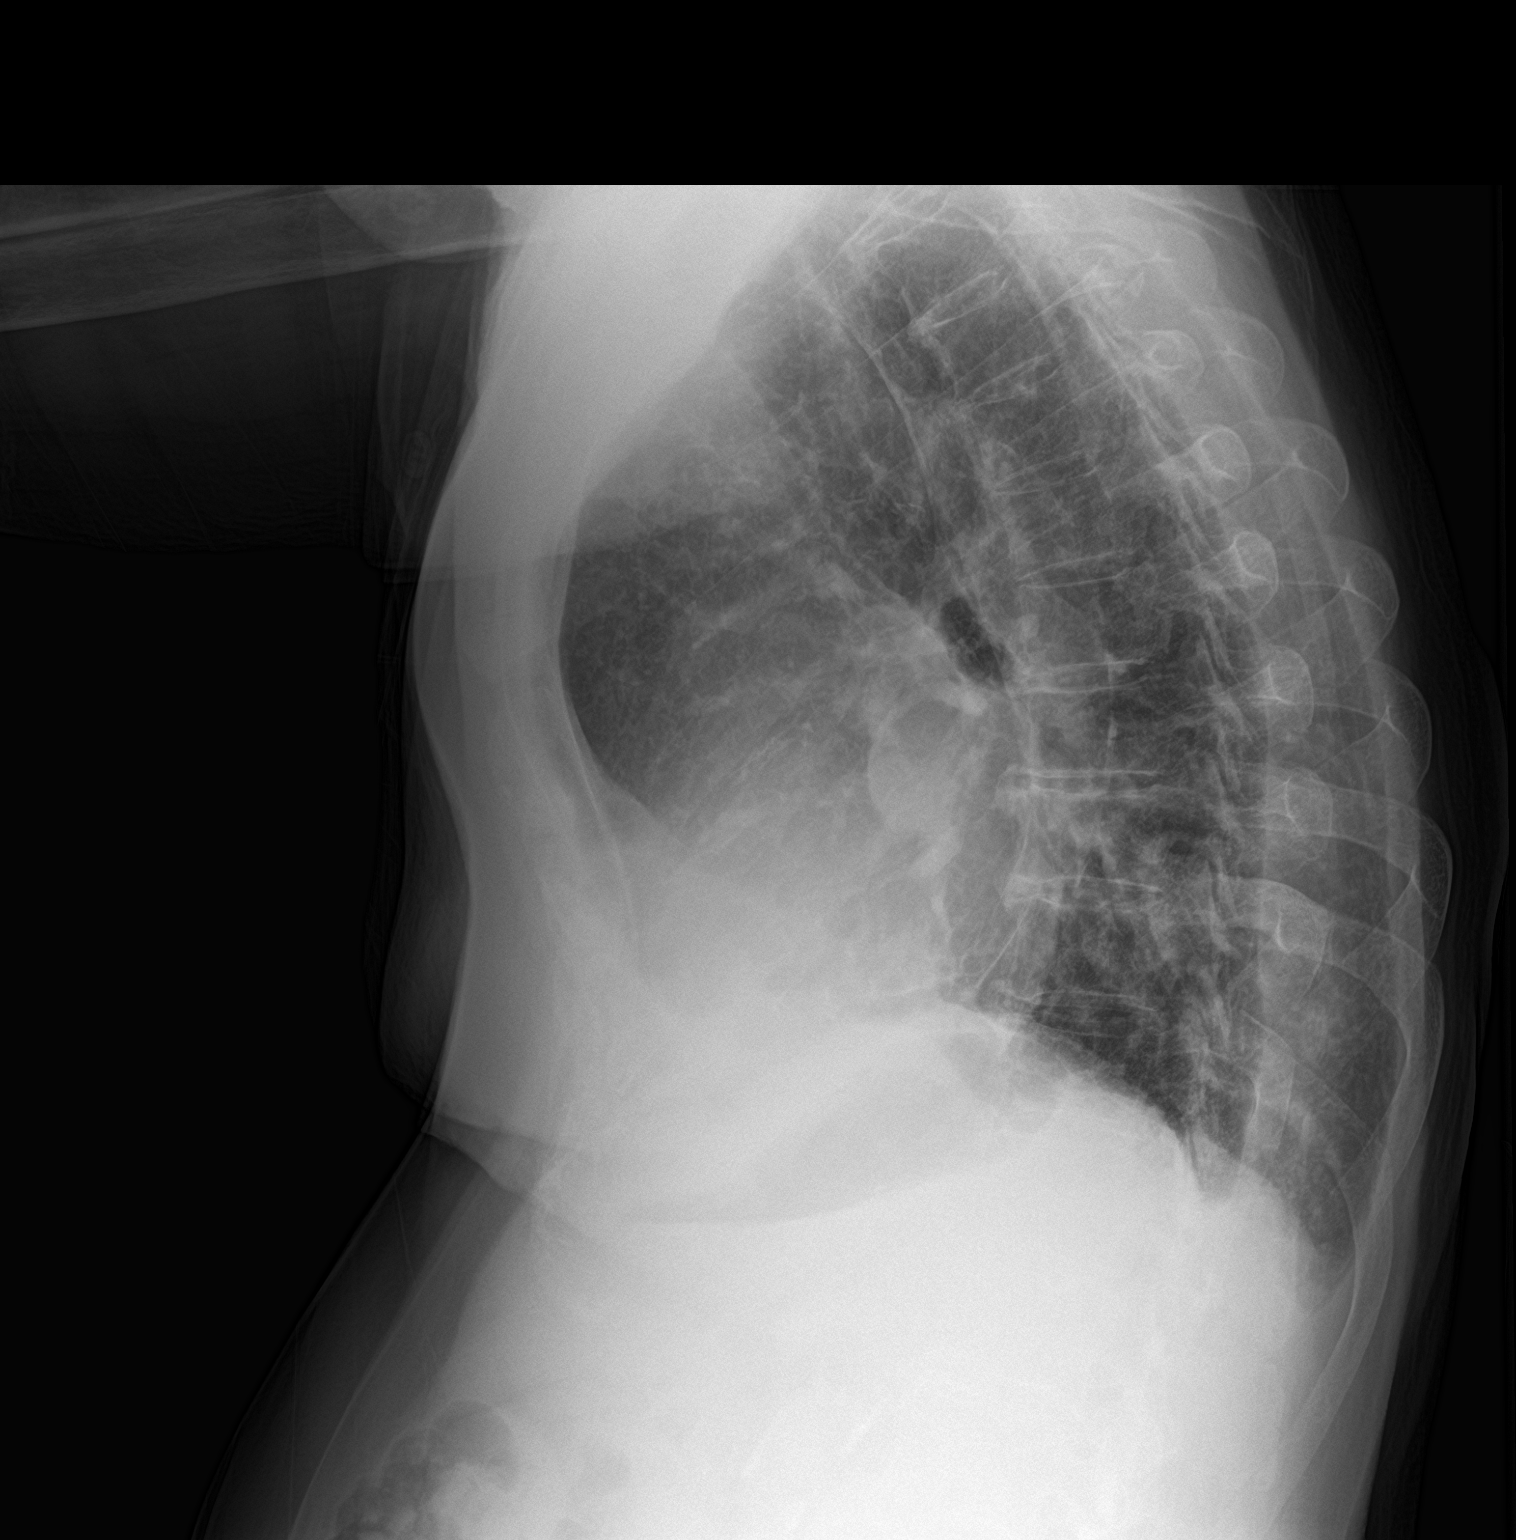

[2 of 2 positions shown; findings below may reference images not displayed]

FINDINGS: Frontal and lateral views of the chest demonstrate a stable cardiac
silhouette. Chronic focal eventration of the right hemidiaphragm. No
acute airspace disease, effusion, or pneumothorax. No acute bony
abnormalities.
IMPRESSION: 1. No acute intrathoracic process.

## 2022-10-02 IMAGING — DX DG CHEST 1V PORT
1 series · 1 of 1 positions shown · non-contrast
Comparison: 05/12/2021

CLINICAL DATA: Shortness of breath starting yesterday.

EXAM:
PORTABLE CHEST 1 VIEW

[chest ap]
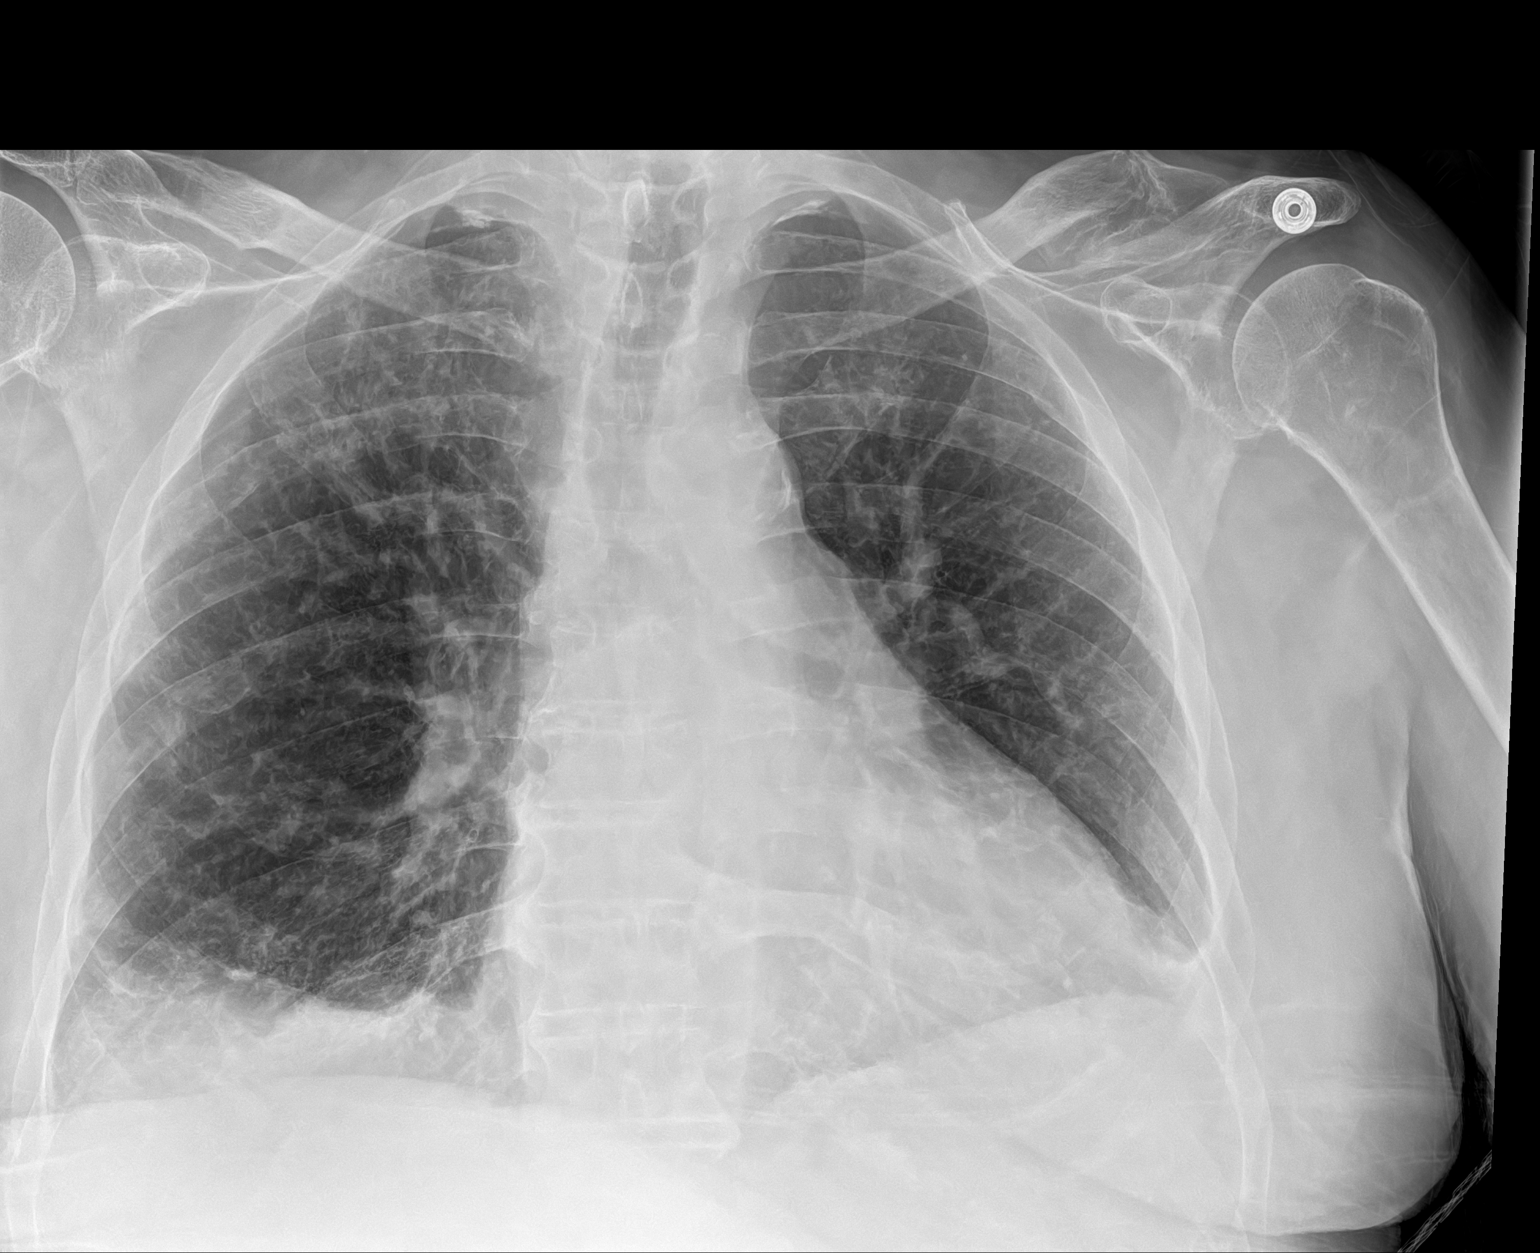

[1 of 1 positions shown; findings below may reference images not displayed]

FINDINGS: Heart size and pulmonary vascularity are normal for technique.
Coarse infiltrates throughout both lungs are unchanged since prior
study, likely representing chronic lung disease. No pleural
effusions. No pneumothorax. No focal consolidation. Pleural
calcifications in the apices. Degenerative changes in the spine and
shoulders. Calcification of the aorta.
IMPRESSION: Chronic fibrosis in the lungs. No evidence of active pulmonary
disease.
